# Patient Record
Sex: Male | Born: 1940 | Race: White | Hispanic: No | Marital: Married | Smoking: Current every day smoker
Health system: Southern US, Community
[De-identification: ages and names within clinical notes are randomized; demographics above are authoritative.]

## PROBLEM LIST (undated history)

## (undated) DIAGNOSIS — C7951 Secondary malignant neoplasm of bone: Secondary | ICD-10-CM

## (undated) DIAGNOSIS — K219 Gastro-esophageal reflux disease without esophagitis: Secondary | ICD-10-CM

## (undated) DIAGNOSIS — H269 Unspecified cataract: Secondary | ICD-10-CM

## (undated) DIAGNOSIS — E785 Hyperlipidemia, unspecified: Secondary | ICD-10-CM

## (undated) DIAGNOSIS — Z923 Personal history of irradiation: Secondary | ICD-10-CM

## (undated) DIAGNOSIS — C61 Malignant neoplasm of prostate: Secondary | ICD-10-CM

## (undated) DIAGNOSIS — I5022 Chronic systolic (congestive) heart failure: Secondary | ICD-10-CM

## (undated) DIAGNOSIS — I10 Essential (primary) hypertension: Secondary | ICD-10-CM

## (undated) DIAGNOSIS — C801 Malignant (primary) neoplasm, unspecified: Secondary | ICD-10-CM

## (undated) HISTORY — PX: OTHER SURGICAL HISTORY: SHX169

## (undated) HISTORY — DX: Hyperlipidemia, unspecified: E78.5

## (undated) HISTORY — DX: Essential (primary) hypertension: I10

## (undated) HISTORY — DX: Chronic systolic (congestive) heart failure: I50.22

## (undated) HISTORY — DX: Gastro-esophageal reflux disease without esophagitis: K21.9

## (undated) HISTORY — DX: Malignant neoplasm of prostate: C61

## (undated) HISTORY — DX: Secondary malignant neoplasm of bone: C79.51

---
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
## 2003-01-18 HISTORY — PX: CHOLECYSTECTOMY: SHX55

## 2008-08-19 DIAGNOSIS — C801 Malignant (primary) neoplasm, unspecified: Secondary | ICD-10-CM | POA: Insufficient documentation

## 2008-08-19 DIAGNOSIS — Z923 Personal history of irradiation: Secondary | ICD-10-CM

## 2008-08-19 HISTORY — DX: Personal history of irradiation: Z92.3

## 2008-08-19 HISTORY — DX: Malignant (primary) neoplasm, unspecified: C80.1

## 2012-07-06 DIAGNOSIS — I119 Hypertensive heart disease without heart failure: Secondary | ICD-10-CM | POA: Insufficient documentation

## 2012-07-06 DIAGNOSIS — I10 Essential (primary) hypertension: Secondary | ICD-10-CM | POA: Insufficient documentation

## 2012-07-06 DIAGNOSIS — I447 Left bundle-branch block, unspecified: Secondary | ICD-10-CM

## 2012-07-06 DIAGNOSIS — I2510 Atherosclerotic heart disease of native coronary artery without angina pectoris: Secondary | ICD-10-CM | POA: Insufficient documentation

## 2012-07-06 DIAGNOSIS — E782 Mixed hyperlipidemia: Secondary | ICD-10-CM

## 2012-07-06 HISTORY — DX: Atherosclerotic heart disease of native coronary artery without angina pectoris: I25.10

## 2012-07-06 HISTORY — DX: Essential (primary) hypertension: I10

## 2012-07-06 HISTORY — DX: Left bundle-branch block, unspecified: I44.7

## 2012-07-06 HISTORY — DX: Hypertensive heart disease without heart failure: I11.9

## 2012-07-06 HISTORY — DX: Mixed hyperlipidemia: E78.2

## 2013-11-17 DIAGNOSIS — H269 Unspecified cataract: Secondary | ICD-10-CM

## 2013-11-17 HISTORY — DX: Unspecified cataract: H26.9

## 2014-07-08 DIAGNOSIS — I359 Nonrheumatic aortic valve disorder, unspecified: Secondary | ICD-10-CM

## 2014-07-08 DIAGNOSIS — I6529 Occlusion and stenosis of unspecified carotid artery: Secondary | ICD-10-CM | POA: Insufficient documentation

## 2014-07-08 HISTORY — DX: Occlusion and stenosis of unspecified carotid artery: I65.29

## 2014-07-08 HISTORY — DX: Nonrheumatic aortic valve disorder, unspecified: I35.9

## 2015-11-27 DIAGNOSIS — N183 Chronic kidney disease, stage 3 unspecified: Secondary | ICD-10-CM

## 2015-11-27 HISTORY — DX: Chronic kidney disease, stage 3 unspecified: N18.30

## 2018-03-22 DIAGNOSIS — K219 Gastro-esophageal reflux disease without esophagitis: Secondary | ICD-10-CM | POA: Insufficient documentation

## 2018-03-22 DIAGNOSIS — E538 Deficiency of other specified B group vitamins: Secondary | ICD-10-CM | POA: Insufficient documentation

## 2018-03-22 HISTORY — DX: Deficiency of other specified B group vitamins: E53.8

## 2018-03-22 HISTORY — DX: Gastro-esophageal reflux disease without esophagitis: K21.9

## 2018-06-25 DIAGNOSIS — M7022 Olecranon bursitis, left elbow: Secondary | ICD-10-CM

## 2018-06-25 HISTORY — DX: Olecranon bursitis, left elbow: M70.22

## 2019-02-04 ENCOUNTER — Other Ambulatory Visit (HOSPITAL_COMMUNITY): Payer: Self-pay | Admitting: Urology

## 2019-02-04 ENCOUNTER — Other Ambulatory Visit: Payer: Self-pay | Admitting: Urology

## 2019-02-04 DIAGNOSIS — C61 Malignant neoplasm of prostate: Secondary | ICD-10-CM

## 2019-02-11 ENCOUNTER — Other Ambulatory Visit: Payer: Self-pay

## 2019-02-11 ENCOUNTER — Encounter (HOSPITAL_COMMUNITY)
Admission: RE | Admit: 2019-02-11 | Discharge: 2019-02-11 | Disposition: A | Discharge status: Home / Self Care | Payer: BC Managed Care – PPO | Source: Ambulatory Visit | Attending: Urology | Admitting: Urology

## 2019-02-11 DIAGNOSIS — C61 Malignant neoplasm of prostate: Secondary | ICD-10-CM

## 2019-02-11 MED ORDER — AXUMIN (FLUCICLOVINE F 18) INJECTION
8.6000 | Freq: Once | INTRAVENOUS | Status: AC | PRN
  Administered 2019-02-11: 8.6 via INTRAVENOUS

## 2019-02-23 ENCOUNTER — Encounter: Payer: Self-pay | Admitting: Gastroenterology

## 2019-02-24 ENCOUNTER — Encounter: Payer: Self-pay | Admitting: Cardiology

## 2019-02-24 ENCOUNTER — Ambulatory Visit (INDEPENDENT_AMBULATORY_CARE_PROVIDER_SITE_OTHER): Payer: BC Managed Care – PPO | Admitting: Cardiology

## 2019-02-24 ENCOUNTER — Other Ambulatory Visit: Payer: Self-pay

## 2019-02-24 ENCOUNTER — Ambulatory Visit: Payer: BC Managed Care – PPO | Admitting: Cardiology

## 2019-02-24 VITALS — BP 132/76 | HR 81 | Ht 69.0 in | Wt 188.0 lb

## 2019-02-24 DIAGNOSIS — R9431 Abnormal electrocardiogram [ECG] [EKG]: Secondary | ICD-10-CM | POA: Diagnosis not present

## 2019-02-24 DIAGNOSIS — I10 Essential (primary) hypertension: Secondary | ICD-10-CM

## 2019-02-24 DIAGNOSIS — F17210 Nicotine dependence, cigarettes, uncomplicated: Secondary | ICD-10-CM | POA: Diagnosis not present

## 2019-02-24 DIAGNOSIS — E7849 Other hyperlipidemia: Secondary | ICD-10-CM

## 2019-02-24 DIAGNOSIS — R0609 Other forms of dyspnea: Secondary | ICD-10-CM

## 2019-02-24 HISTORY — DX: Nicotine dependence, cigarettes, uncomplicated: F17.210

## 2019-02-24 HISTORY — DX: Other hyperlipidemia: E78.49

## 2019-02-24 MED ORDER — ATORVASTATIN CALCIUM 20 MG PO TABS: 20.0000 mg | ORAL_TABLET | Freq: Every day | ORAL | 3 refills | Status: DC

## 2019-02-24 NOTE — Progress Notes (Signed)
Cardiology Office Note:    Date:  02/24/2019   ID:  Tommy Ward, DOB Nov 07, 1940, MRN [id number]  PCP:  Serita Grammes, MD  Cardiologist:  Jenean Lindau, MD   Referring MD: Serita Grammes, MD    ASSESSMENT:    1. Familial hyperlipidemia   2. Essential hypertension   3. 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 Dyspnea on exertion   5. EKG abnormality    PLAN:    In order of problems listed above:  1. 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  He has not had blood work recently and therefore we will get him in the morning for blood work including Chem-7 and liver lipid check.  Then we will initiate him on atorvastatin 20 mg daily.  He has tried statin therapy in the past but it has not worked for him because of myalgias.  He is willing to try it again.  He tells me that injectable lipid-lowering medications were tried in the past but subsequently they were not approved by his insurance company.  So we will try statins.  He is agreeable. 2. Abnormal EKG and dyspnea on exertion: Patient will undergo Lexiscan sestamibi to assess for objective evidence of coronary artery disease in view of risk factors. 3. Essential hypertension: His blood pressure stable and diet was discussed including salt intake issues.  Echocardiogram will be done to assess murmur heard on auscultation. 4. Cigarette smoking: I spent 5 minutes with the patient discussing solely about smoking. Smoking cessation was counseled. I suggested to the patient also different medications and pharmacological interventions. Patient is keen to try stopping on its own at this time. He will get back to me if he needs any further assistance in this matter. 5. Follow-up appointment in 2 months or earlier if he has any concerns.   Medication Adjustments/Labs and Tests Ordered: Current medicines are reviewed at length with the patient today.  Concerns regarding  medicines are outlined above.  No orders of the defined types were placed in this encounter.  No orders of the defined types were placed in this encounter.    History of Present Illness:    Tommy Ward is a 78 y.o. male who is being seen today for the evaluation of marked hyperlipidemia at the request of Serita Grammes, MD.  Patient is a pleasant 78 year old male with past medical history of essential hypertension, family hyperlipidemia and active heavy smoking.  He is referred here because of elevated lipids and abnormal EKG.  He leads a sedentary lifestyle because of orthopedic issues.  No chest pain orthopnea or PND.  At the time of my evaluation, the patient is alert awake oriented and in no distress.  History reviewed. No pertinent past medical history.  Past Surgical History:  Procedure Laterality Date  . left hip repaired      Current Medications: Current Meds  Medication Sig  . cholecalciferol (VITAMIN D3) 25 MCG (1000 UT) tablet Take 1,000 Units by mouth daily.  Marland Kitchen LEUPROLIDE ACETATE, 6 MONTH, 45 MG injection   . lisinopril (ZESTRIL) 40 MG tablet Take 1 tablet by mouth daily.  . meclizine (ANTIVERT) 25 MG tablet Take 1 tablet by mouth daily.  . metoprolol succinate (TOPROL-XL) 50 MG 24 hr tablet Take 1 tablet by mouth daily.  Marland Kitchen omeprazole (PRILOSEC) 40 MG capsule Take 1 capsule by mouth daily.  . pregabalin (LYRICA) 75 MG capsule Take 1 capsule by mouth daily.  Marland Kitchen  tamsulosin (FLOMAX) 0.4 MG CAPS capsule Take 1 capsule by mouth daily.  Marland Kitchen thiamine (VITAMIN B-1) 100 MG tablet Take 100 mg by mouth daily.     Allergies:   Patient has no known allergies.   Social History   Socioeconomic History  . Marital status: Married    Spouse name: Not on file  . Number of children: Not on file  . Years of education: Not on file  . Highest education level: Not on file  Occupational History  . Not on file  Social Needs  . Financial resource strain: Not on file  . Food insecurity     Worry: Not on file    Inability: Not on file  . Transportation needs    Medical: Not on file    Non-medical: Not on file  Tobacco Use  . Smoking status: Current Every Day Smoker  . Smokeless tobacco: Never Used  Substance and Sexual Activity  . Alcohol use: Not on file  . Drug use: Not on file  . Sexual activity: Not on file  Lifestyle  . Physical activity    Days per week: Not on file    Minutes per session: Not on file  . Stress: Not on file  Relationships  . Social Herbalist on phone: Not on file    Gets together: Not on file    Attends religious service: Not on file    Active member of club or organization: Not on file    Attends meetings of clubs or organizations: Not on file    Relationship status: Not on file  Other Topics Concern  . Not on file  Social History Narrative  . Not on file     Family History: The patient's family history includes Heart attack in his brother; Leukemia in his mother; Prostate cancer in his father.  ROS:   Please see the history of present illness.    All other systems reviewed and are negative.  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  And T wave inversions in lateral leads.   Recent Labs: No results found for requested labs within last 8760 hours.  Recent Lipid Panel No results found for: CHOL, TRIG, HDL, CHOLHDL, VLDL, LDLCALC, LDLDIRECT  Physical Exam:    VS:  BP 132/76 (BP Location: Left Arm, Patient Position: Sitting, Cuff Size: Normal)   Pulse 81   Ht 5\' 9"  (1.753 m)   Wt 188 lb (85.3 kg)   SpO2 98%   BMI 27.76 kg/m     Wt Readings from Last 3 Encounters:  02/24/19 188 lb (85.3 kg)     GEN: Patient is in no acute distress HEENT: Normal NECK: No JVD; No carotid bruits LYMPHATICS: No  lymphadenopathy CARDIAC: S1 S2 regular, 2/6 systolic murmur at the apex. RESPIRATORY:  Clear to auscultation without rales, wheezing or rhonchi  ABDOMEN: Soft, non-tender, non-distended MUSCULOSKELETAL:  No edema; No deformity  SKIN: Warm and dry NEUROLOGIC:  Alert and oriented x 3 PSYCHIATRIC:  Normal affect    Signed, Jenean Lindau, MD  02/24/2019 2:28 PM    [place]

## 2019-02-24 NOTE — Patient Instructions (Addendum)
Medication Instructions:  Your physician has recommended you make the following change in your medication:   START taking atorvastatin 20 mg (1 tablet) once daily  If you need a refill on your cardiac medications before your next appointment, please call your pharmacy.   Lab work: Your physician recommends that you return FASTING tomorrow for BMP, lipid and liver panel to be drawn.  If you have labs (blood work) drawn today and your tests are completely normal, you will receive your results only by: Marland Kitchen MyChart Message (if you have MyChart) OR . A paper copy in the mail If you have any lab test that is abnormal or we need to change your treatment, we will call you to review the results.  Testing/Procedures: You had an EKG performed today.   Your physician has requested that you have an echocardiogram. Echocardiography is a painless test that uses sound waves to create images of your heart. It provides your doctor with information about the size and shape of your heart and how well your heart's chambers and valves are working. This procedure takes approximately one hour. There are no restrictions for this procedure.  Your physician has requested that you have a lexiscan myoview. For further information please visit [email or web address]. Please follow instruction sheet, as given.    Follow-Up: At [facility], you and your health needs are our priority.  As part of our continuing mission to provide you with exceptional heart care, we have created designated Provider Care Teams.  These Care Teams include your primary Cardiologist (physician) and Advanced Practice Providers (APPs -  Physician Assistants and Nurse Practitioners) who all work together to provide you with the care you need, when you need it. You will need a follow up appointment in 2 months.     Any Other Special Instructions Will Be Listed Below  Atorvastatin tablets What is this medicine? ATORVASTATIN (a TORE va sta tin)  is known as a HMG-CoA reductase inhibitor or 'statin'. It lowers the level of cholesterol and triglycerides in the blood. This drug may also reduce the risk of heart attack, stroke, or other health problems in patients with risk factors for heart disease. Diet and lifestyle changes are often used with this drug. This medicine may be used for other purposes; ask your health care provider or pharmacist if you have questions. COMMON BRAND NAME(S): Lipitor What should I tell my health care provider before I take this medicine? They need to know if you have any of these conditions:  diabetes  if you often drink alcohol  history of stroke  kidney disease  liver disease  muscle aches or weakness  thyroid disease  an unusual or allergic reaction to atorvastatin, other medicines, foods, dyes, or preservatives  pregnant or trying to get pregnant  breast-feeding How should I use this medicine? Take this medicine by mouth with a glass of water. Follow the directions on the prescription label. You can take it with or without food. If it upsets your stomach, take it with food. Do not take with grapefruit juice. Take your medicine at regular intervals. Do not take it more often than directed. Do not stop taking except on your doctor's advice. Talk to your pediatrician regarding the use of this medicine in children. While this drug may be prescribed for children as young as 10 for selected conditions, precautions do apply. Overdosage: If you think you have taken too much of this medicine contact a poison control center or emergency room at once. NOTE:  This medicine is only for you. Do not share this medicine with others. What if I miss a dose? If you miss a dose, take it as soon as you can. If your next dose is to be taken in less than 12 hours, then do not take the missed dose. Take the next dose at your regular time. Do not take double or extra doses. What may interact with this medicine? Do not  take this medicine with any of the following medications:  dasabuvir; ombitasvir; paritaprevir; ritonavir  ombitasvir; paritaprevir; ritonavir  posaconazole  red yeast rice This medicine may also interact with the following medications:  alcohol  birth control pills  certain antibiotics like erythromycin and clarithromycin  certain antivirals for HIV or hepatitis  certain medicines for cholesterol like fenofibrate, gemfibrozil, and niacin  certain medicines for fungal infections like ketoconazole and itraconazole  colchicine  cyclosporine  digoxin  grapefruit juice  rifampin This list may not describe all possible interactions. Give your health care provider a list of all the medicines, herbs, non-prescription drugs, or dietary supplements you use. Also tell them if you smoke, drink alcohol, or use illegal drugs. Some items may interact with your medicine. What should I watch for while using this medicine? Visit your doctor or health care professional for regular check-ups. You may need regular tests to make sure your liver is working properly. Your health care professional may tell you to stop taking this medicine if you develop muscle problems. If your muscle problems do not go away after stopping this medicine, contact your health care professional. Do not become pregnant while taking this medicine. Women should inform their health care professional if they wish to become pregnant or think they might be pregnant. There is a potential for serious side effects to an unborn child. Talk to your health care professional or pharmacist for more information. Do not breast-feed an infant while taking this medicine. This medicine may increase blood sugar. Ask your healthcare provider if changes in diet or medicines are needed if you have diabetes. If you are going to need surgery or other procedure, tell your doctor that you are using this medicine. This drug is only part of a total  heart-health program. Your doctor or a dietician can suggest a low-cholesterol and low-fat diet to help. Avoid alcohol and smoking, and keep a proper exercise schedule. This medicine may cause a decrease in Co-Enzyme Q-10. You should make sure that you get enough Co-Enzyme Q-10 while you are taking this medicine. Discuss the foods you eat and the vitamins you take with your health care professional. What side effects may I notice from receiving this medicine? Side effects that you should report to your doctor or health care professional as soon as possible:  allergic reactions like skin rash, itching or hives, swelling of the face, lips, or tongue  fever  joint pain  loss of memory  redness, blistering, peeling or loosening of the skin, including inside the mouth  signs and symptoms of high blood sugar such as being more thirsty or hungry or having to urinate more than normal. You may also feel very tired or have blurry vision.  signs and symptoms of liver injury like dark yellow or brown urine; general ill feeling or flu-like symptoms; light-belly pain; unusually weak or tired; yellowing of the eyes or skin  signs and symptoms of muscle injury like dark urine; trouble passing urine or change in the amount of urine; unusually weak or tired; muscle pain  or side or back pain Side effects that usually do not require medical attention (report to your doctor or health care professional if they continue or are bothersome):  diarrhea  nausea  stomach pain  trouble sleeping  upset stomach This list may not describe all possible side effects. Call your doctor for medical advice about side effects. You may report side effects to FDA at 1-800-FDA-1088. Where should I keep my medicine? Keep out of the reach of children. Store between 20 and 25 degrees C (68 and 77 degrees F). Throw away any unused medicine after the expiration date. NOTE: This sheet is a summary. It may not cover all possible  information. If you have questions about this medicine, talk to your doctor, pharmacist, or health care provider.  2020 Elsevier/Gold Standard (2018-05-27 11:36:16)  Echocardiogram An echocardiogram is a procedure that uses painless sound waves (ultrasound) to produce an image of the heart. Images from an echocardiogram can provide important information about:  Signs of coronary artery disease (CAD).  Aneurysm detection. An aneurysm is a weak or damaged part of an artery wall that bulges out from the normal force of blood pumping through the body.  Heart size and shape. Changes in the size or shape of the heart can be associated with certain conditions, including heart failure, aneurysm, and CAD.  Heart muscle function.  Heart valve function.  Signs of a past heart attack.  Fluid buildup around the heart.  Thickening of the heart muscle.  A tumor or infectious growth around the heart valves. Tell a health care provider about:  Any allergies you have.  All medicines you are taking, including vitamins, herbs, eye drops, creams, and over-the-counter medicines.  Any blood disorders you have.  Any surgeries you have had.  Any medical conditions you have.  Whether you are pregnant or may be pregnant. What are the risks? Generally, this is a safe procedure. However, problems may occur, including:  Allergic reaction to dye (contrast) that may be used during the procedure. What happens before the procedure? No specific preparation is needed. You may eat and drink normally. What happens during the procedure?   An IV tube may be inserted into one of your veins.  You may receive contrast through this tube. A contrast is an injection that improves the quality of the pictures from your heart.  A gel will be applied to your chest.  A wand-like tool (transducer) will be moved over your chest. The gel will help to transmit the sound waves from the transducer.  The sound waves  will harmlessly bounce off of your heart to allow the heart images to be captured in real-time motion. The images will be recorded on a computer. The procedure may vary among health care providers and hospitals. What happens after the procedure?  You may return to your normal, everyday life, including diet, activities, and medicines, unless your health care provider tells you not to do that. Summary  An echocardiogram is a procedure that uses painless sound waves (ultrasound) to produce an image of the heart.  Images from an echocardiogram can provide important information about the size and shape of your heart, heart muscle function, heart valve function, and fluid buildup around your heart.  You do not need to do anything to prepare before this procedure. You may eat and drink normally.  After the echocardiogram is completed, you may return to your normal, everyday life, unless your health care provider tells you not to do that. This  information is not intended to replace advice given to you by your health care provider. Make sure you discuss any questions you have with your health care provider. Document Released: 08/02/2000 Document Revised: 11/26/2018 Document Reviewed: 09/07/2016 Elsevier Patient Education  [place] injection What is this medicine? REGADENOSON is used to test the heart for coronary artery disease. It is used in patients who can not exercise for their stress test. This medicine may be used for other purposes; ask your health care provider or pharmacist if you have questions. COMMON BRAND NAME(S): Lexiscan What should I tell my health care provider before I take this medicine? They need to know if you have any of these conditions:  heart problems  lung or breathing disease, like asthma or COPD  an unusual or allergic reaction to regadenoson, other medicines, foods, dyes, or preservatives  pregnant or trying to get  pregnant  breast-feeding How should I use this medicine? This medicine is for injection into a vein. It is given by a health care professional in a hospital or clinic setting. Talk to your pediatrician regarding the use of this medicine in children. Special care may be needed. Overdosage: If you think you have taken too much of this medicine contact a poison control center or emergency room at once. NOTE: This medicine is only for you. Do not share this medicine with others. What if I miss a dose? This does not apply. What may interact with this medicine?  caffeine  dipyridamole  guarana  theophylline This list may not describe all possible interactions. Give your health care provider a list of all the medicines, herbs, non-prescription drugs, or dietary supplements you use. Also tell them if you smoke, drink alcohol, or use illegal drugs. Some items may interact with your medicine. What should I watch for while using this medicine? Your condition will be monitored carefully while you are receiving this medicine. Do not take medicines, foods, or drinks with caffeine (like coffee, tea, or colas) for at least 12 hours before your test. If you do not know if something contains caffeine, ask your health care professional. What side effects may I notice from receiving this medicine? Side effects that you should report to your doctor or health care professional as soon as possible:  allergic reactions like skin rash, itching or hives, swelling of the face, lips, or tongue  breathing problems  chest pain, tightness or palpitations  severe headache Side effects that usually do not require medical attention (report to your doctor or health care professional if they continue or are bothersome):  flushing  headache  irritation or pain at site where injected  nausea, vomiting This list may not describe all possible side effects. Call your doctor for medical advice about side effects.  You may report side effects to FDA at 1-800-FDA-1088. Where should I keep my medicine? This drug is given in a hospital or clinic and will not be stored at home. NOTE: This sheet is a summary. It may not cover all possible information. If you have questions about this medicine, talk to your doctor, pharmacist, or health care provider.  2020 Elsevier/Gold Standard (2008-04-04 15:08:13)  Cardiac Nuclear Scan A cardiac nuclear scan is a test that is done to check the flow of blood to your heart. It is done when you are resting and when you are exercising. The test looks for problems such as:  Not enough blood reaching a portion of the heart.  The heart muscle  not working as it should. You may need this test if:  You have heart disease.  You have had lab results that are not normal.  You have had heart surgery or a balloon procedure to open up blocked arteries (angioplasty).  You have chest pain.  You have shortness of breath. In this test, a special dye (tracer) is put into your bloodstream. The tracer will travel to your heart. A camera will then take pictures of your heart to see how the tracer moves through your heart. This test is usually done at a hospital and takes 2-4 hours. Tell a doctor about:  Any allergies you have.  All medicines you are taking, including vitamins, herbs, eye drops, creams, and over-the-counter medicines.  Any problems you or family members have had with anesthetic medicines.  Any blood disorders you have.  Any surgeries you have had.  Any medical conditions you have.  Whether you are pregnant or may be pregnant. What are the risks? Generally, this is a safe test. However, problems may occur, such as:  Serious chest pain and heart attack. This is only a risk if the stress portion of the test is done.  Rapid heartbeat.  A feeling of warmth in your chest. This feeling usually does not last long.  Allergic reaction to the tracer. What happens  before the test?  Ask your doctor about changing or stopping your normal medicines. This is important.  Follow instructions from your doctor about what you cannot eat or drink.  Remove your jewelry on the day of the test. What happens during the test?  An IV tube will be inserted into one of your veins.  Your doctor will give you a small amount of tracer through the IV tube.  You will wait for 20-40 minutes while the tracer moves through your bloodstream.  Your heart will be monitored with an electrocardiogram (ECG).  You will lie down on an exam table.  Pictures of your heart will be taken for about 15-20 minutes.  You may also have a stress test. For this test, one of these things may be done: ? You will be asked to exercise on a treadmill or a stationary bike. ? You will be given medicines that will make your heart work harder. This is done if you are unable to exercise.  When blood flow to your heart has peaked, a tracer will again be given through the IV tube.  After 20-40 minutes, you will get back on the exam table. More pictures will be taken of your heart.  Depending on the tracer that is used, more pictures may need to be taken 3-4 hours later.  Your IV tube will be removed when the test is over. The test may vary among doctors and hospitals. What happens after the test?  Ask your doctor: ? Whether you can return to your normal schedule, including diet, activities, and medicines. ? Whether you should drink more fluids. This will help to remove the tracer from your body. Drink enough fluid to keep your pee (urine) pale yellow.  Ask your doctor, or the department that is doing the test: ? When will my results be ready? ? How will I get my results? Summary  A cardiac nuclear scan is a test that is done to check the flow of blood to your heart.  Tell your doctor whether you are pregnant or may be pregnant.  Before the test, ask your doctor about changing or  stopping your normal  medicines. This is important.  Ask your doctor whether you can return to your normal activities. You may be asked to drink more fluids. This information is not intended to replace advice given to you by your health care provider. Make sure you discuss any questions you have with your health care provider. Document Released: 01/19/2018 Document Revised: 11/25/2018 Document Reviewed: 01/19/2018 Elsevier Patient Education  2020 [employer].

## 2019-02-25 LAB — LIPID PANEL
Chol/HDL Ratio: 7.8 ratio — ABNORMAL HIGH (ref 0.0–5.0)
Cholesterol, Total: 400 mg/dL — ABNORMAL HIGH (ref 100–199)
HDL: 51 mg/dL (ref >39)
LDL Calculated: 290 mg/dL — ABNORMAL HIGH (ref 0–99)
Triglycerides: 295 mg/dL — ABNORMAL HIGH (ref 0–149)
VLDL Cholesterol Cal: 59 mg/dL — ABNORMAL HIGH (ref 5–40)

## 2019-02-25 LAB — BASIC METABOLIC PANEL
BUN/Creatinine Ratio: 21 (ref 10–24)
BUN: 61 mg/dL — ABNORMAL HIGH (ref 8–27)
CO2: 16 mmol/L — ABNORMAL LOW (ref 20–29)
Calcium: 10.6 mg/dL — ABNORMAL HIGH (ref 8.6–10.2)
Chloride: 109 mmol/L — ABNORMAL HIGH (ref 96–106)
Creatinine, Ser: 2.87 mg/dL — ABNORMAL HIGH (ref 0.76–1.27)
GFR calc Af Amer: 23 mL/min/{1.73_m2} — ABNORMAL LOW (ref >59)
GFR calc non Af Amer: 20 mL/min/{1.73_m2} — ABNORMAL LOW (ref >59)
Glucose: 108 mg/dL — ABNORMAL HIGH (ref 65–99)
Potassium: 6 mmol/L — ABNORMAL HIGH (ref 3.5–5.2)
Sodium: 139 mmol/L (ref 134–144)

## 2019-02-25 LAB — HEPATIC FUNCTION PANEL
ALT: 17 IU/L (ref 0–44)
AST: 20 IU/L (ref 0–40)
Albumin: 4.4 g/dL (ref 3.7–4.7)
Alkaline Phosphatase: 61 IU/L (ref 39–117)
Bilirubin Total: 0.3 mg/dL (ref 0.0–1.2)
Bilirubin, Direct: 0.09 mg/dL (ref 0.00–0.40)
Total Protein: 6.3 g/dL (ref 6.0–8.5)

## 2019-02-26 ENCOUNTER — Encounter: Payer: Self-pay | Admitting: Gastroenterology

## 2019-02-26 ENCOUNTER — Telehealth (INDEPENDENT_AMBULATORY_CARE_PROVIDER_SITE_OTHER): Payer: BC Managed Care – PPO | Admitting: Gastroenterology

## 2019-02-26 ENCOUNTER — Other Ambulatory Visit: Payer: Self-pay

## 2019-02-26 VITALS — Ht 69.0 in | Wt 187.0 lb

## 2019-02-26 DIAGNOSIS — D638 Anemia in other chronic diseases classified elsewhere: Secondary | ICD-10-CM

## 2019-02-26 DIAGNOSIS — K219 Gastro-esophageal reflux disease without esophagitis: Secondary | ICD-10-CM | POA: Diagnosis not present

## 2019-02-26 DIAGNOSIS — K625 Hemorrhage of anus and rectum: Secondary | ICD-10-CM | POA: Diagnosis not present

## 2019-02-26 NOTE — Addendum Note (Signed)
Addended by: Herma Mering D on: 02/26/2019 04:35 PM   Modules accepted: Orders

## 2019-02-26 NOTE — Progress Notes (Signed)
Chief Complaint: Rectal bleeding/anemia  Referring Provider:  Serita Grammes, MD      ASSESSMENT AND PLAN;   #1. Rectal Bleeding. H/O XRT for prostate Ca. D/d hoids, AVMs, colitis, polyps, stercoral ulcers etc, r/o colonic neoplasms or IBD. #2. 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 GERD.  Plan: - Proceed with EGD/colon with miralax. Discussed risks & benefits. (Risks including rare perforation req laparotomy, bleeding after biopsies/polypectomy req blood transfusion, rare chance of missing neoplasms, risks of anesthesia/sedation). Benefits outweigh the risks. Patient agrees to proceed. All the questions were answered.  - Continue omeprazole 40mg  po qd.  Once under better control, decrease dose. - Switch sweet tea to un-sweet tea. May help with reflux.  Lifestyle changes were discussed as well. - May need ? nephrology consultation.   HPI:    Tommy Ward is a 78 y.o. male  With occ rectal bleeding -intermittent, especially when he gets constipated, has been taking Prevalite and orange juice every day with good results.  Blood mostly away from the stool.  Mostly bright red.  No rectal or lower abdominal pain.  Found to be anemic with hemoglobin of 11.2, rechecked at 11.6 on 01/2019. Also had one episode of ? melanotic stools.  None since. Has been having more heartburn over the last 2 to 3 weeks.  No odynophagia or dysphagia.  Has changed some of his medicines.  No weight loss.  No nonsteroidals.  Had "irregular heart rate"-seen by Dr. [name], likely PVCs.  Had negative 2D echo.  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  Colonoscopies - several in [place]. Last time at age 51. Had polyps in past but none during the last colonoscopy. Past Medical History:  Diagnosis Date  . GERD (gastroesophageal reflux disease)   . Hypertension    -Stage IV prostate cancer, CRI, ?DM, familial hypercholesterolemia.  Past Surgical History:  Procedure  Laterality Date  . left hip repaired      Family History  Problem Relation Age of Onset  . Leukemia Mother   . Prostate cancer Father   . Heart attack Brother   . Colon cancer Neg Hx     Social History   Tobacco Use  . Smoking status: Current Every Day Smoker    Packs/day: 0.75  . Smokeless tobacco: Never Used  Substance Use Topics  . Alcohol use: Not Currently  . Drug use: Not on file    Current Outpatient Medications  Medication Sig Dispense Refill  . atorvastatin (LIPITOR) 20 MG tablet Take 1 tablet (20 mg total) by mouth daily. 90 tablet 3  . cholecalciferol (VITAMIN D3) 25 MCG (1000 UT) tablet Take 1,000 Units by mouth daily.    Marland Kitchen lisinopril (ZESTRIL) 40 MG tablet Take 1 tablet by mouth daily.    . meclizine (ANTIVERT) 25 MG tablet Take 1 tablet by mouth daily.    . metoprolol succinate (TOPROL-XL) 50 MG 24 hr tablet Take 1 tablet by mouth daily.    Marland Kitchen omeprazole (PRILOSEC) 40 MG capsule Take 1 capsule by mouth daily.    . pregabalin (LYRICA) 75 MG capsule Take 1 capsule by mouth daily.    . tamsulosin (FLOMAX) 0.4 MG CAPS capsule Take 1 capsule by mouth daily.    Marland Kitchen thiamine (VITAMIN B-1) 100 MG tablet Take 100 mg by mouth daily.    Marland Kitchen LEUPROLIDE ACETATE, 6 MONTH, 45 MG injection      No current facility-administered medications for this  visit.     No Known Allergies  Review of Systems:  Constitutional: Denies fever, chills, diaphoresis, appetite change and fatigue.  HEENT: Denies photophobia, eye pain, redness, hearing loss, ear pain, congestion, sore throat, rhinorrhea, sneezing, mouth sores, neck pain, neck stiffness and tinnitus.   Respiratory: Denies SOB, DOE, cough, chest tightness,  and wheezing.   Cardiovascular: Denies chest pain, palpitations and leg swelling.  Genitourinary: Denies dysuria, urgency, frequency, hematuria, flank pain and difficulty urinating.  Musculoskeletal: Denies myalgias, back pain, joint swelling, arthralgias and gait problem.  Skin:  No rash.  Neurological: Denies dizziness, seizures, syncope, weakness, light-headedness, numbness and headaches.  Hematological: Denies adenopathy. Easy bruising, personal or family bleeding history  Psychiatric/Behavioral: No anxiety or depression     Physical Exam:    Ht 5\' 9"  (1.753 m)   Wt 187 lb (84.8 kg)   BMI 27.62 kg/m  Filed Weights   02/26/19 1427  Weight: 187 lb (84.8 kg)  televisit.  Data Reviewed: I have personally reviewed following labs and imaging studies  CBC: No flowsheet data found.  CMP: CMP Latest Ref Rng & Units 02/25/2019  Glucose 65 - 99 mg/dL 108(H)  BUN 8 - 27 mg/dL 61(H)  Creatinine 0.76 - 1.27 mg/dL 2.87(H)  Sodium 134 - 144 mmol/L 139  Potassium 3.5 - 5.2 mmol/L 6.0(H)  Chloride 96 - 106 mmol/L 109(H)  CO2 20 - 29 mmol/L 16(L)  Calcium 8.6 - 10.2 mg/dL 10.6(H)  Total Protein 6.0 - 8.5 g/dL 6.3  Total Bilirubin 0.0 - 1.2 mg/dL 0.3  Alkaline Phos 39 - 117 IU/L 61  AST 0 - 40 IU/L 20  ALT 0 - 44 IU/L 17    GFR: Estimated Creatinine Clearance: 21.6 mL/min (A) (by C-G formula based on SCr of 2.87 mg/dL (H)). Liver Function Tests: Recent Labs  Lab 02/25/19 0905  AST 20  ALT 17  ALKPHOS 61  BILITOT 0.3  PROT 6.3  ALBUMIN 4.4      Radiology Studies: Nm Pet (axumin) Skull Base To Mid Thigh  Result Date: 02/12/2019 CLINICAL DATA:  Prostate carcinoma with biochemical recurrence. PSA equal 1.4 EXAM: NUCLEAR MEDICINE PET SKULL BASE TO THIGH TECHNIQUE: 8.6 mCi F-18 Fluciclovine was injected intravenously. Full-ring PET imaging was performed from the skull base to thigh after the radiotracer. CT data was obtained and used for attenuation correction and anatomic localization. COMPARISON:  None FINDINGS: NECK No radiotracer activity in neck lymph nodes. Incidental CT finding: None CHEST No radiotracer accumulation within mediastinal or hilar lymph nodes. No suspicious pulmonary nodules on the CT scan. Incidental CT finding: None ABDOMEN/PELVIS  Prostate: No focal activity in the prostate bed. Lymph nodes: No abnormal radiotracer accumulation within pelvic or abdominal nodes. Liver: No evidence of liver metastasis Incidental CT finding: None SKELETON Focal activity within the LEFT sacral ala with SUV max equal 8.3 (image 71). This corresponds to a subtle intermediate density lesion on CT measuring 2.2 cm. No additional lesions in the skeleton accumulate the prostate cancer specific radiotracer. IMPRESSION: 1. Lesion in the LEFT sacral ala accumulates the prostate cancer specific radiotracer and consistent prostate cancer metastasis. 2. No evidence of local recurrence or nodal metastasis in the pelvis. 3. No soft tissue metastasis. Electronically Signed   By: Suzy Bouchard M.D.   On: 02/12/2019 11:04   This service was provided via telemedicine. Doxy-video visit failed as it would not connect,  the patient was located at home.  The provider was located in office.  The patient did consent  to this telephone visit and is aware of possible charges through their insurance for this visit.  The patient was referred by Serita Grammes MD.    Time spent on call/coordination of care/review of records: 37 min    Carmell Austria, MD 02/26/2019, 2:54 PM  Cc: Serita Grammes, MD

## 2019-02-26 NOTE — Patient Instructions (Signed)
If you are age 77 or older, your body mass index should be between 23-30. Your Body mass index is 27.62 kg/m. If this is out of the aforementioned range listed, please consider follow up with your Primary Care Provider.  If you are age 91 or younger, your body mass index should be between 19-25. Your Body mass index is 27.62 kg/m. If this is out of the aformentioned range listed, please consider follow up with your Primary Care Provider.   To help prevent the possible spread of infection to our patients, communities, and staff; we will be implementing the following measures:  As of now we are not allowing any visitors/family members to accompany you to any upcoming appointments with [facility]. If you have any concerns about this please contact our office to discuss prior to the appointment.   You have been scheduled for an endoscopy and colonoscopy. Please follow the written instructions given to you at your visit today. Please pick up your prep supplies at the pharmacy within the next 1-3 days. If you use inhalers (even only as needed), please bring them with you on the day of your procedure. Your physician has requested that you go to www.startemmi.com and enter the access code given to you at your visit today. This web site gives a general overview about your procedure. However, you should still follow specific instructions given to you by our office regarding your preparation for the procedure.  Please purchase the following medications over the counter and take as directed: Miralax  Continue taking your Omeprazole as prescribed.  Change Sweet tea to un-sweetened tea to see if it helps with your reflux.  Thank you,  Dr. Jackquline Denmark

## 2019-03-01 ENCOUNTER — Telehealth: Payer: Self-pay | Admitting: Cardiology

## 2019-03-01 ENCOUNTER — Encounter: Payer: Self-pay | Admitting: *Deleted

## 2019-03-01 ENCOUNTER — Telehealth: Payer: Self-pay | Admitting: *Deleted

## 2019-03-01 DIAGNOSIS — C7951 Secondary malignant neoplasm of bone: Secondary | ICD-10-CM

## 2019-03-01 DIAGNOSIS — I10 Essential (primary) hypertension: Secondary | ICD-10-CM

## 2019-03-01 DIAGNOSIS — C61 Malignant neoplasm of prostate: Secondary | ICD-10-CM

## 2019-03-01 MED ORDER — SODIUM POLYSTYRENE SULFONATE 15 GM/60ML PO SUSP: 15.0000 g | Freq: Once | ORAL | 0 refills | Status: DC

## 2019-03-01 MED ORDER — SODIUM POLYSTYRENE SULFONATE 15 GM/60ML PO SUSP: 15.0000 g | Freq: Once | ORAL | 0 refills | Status: AC

## 2019-03-01 NOTE — Telephone Encounter (Signed)
Patient called back. Verbalized understanding to both messages.

## 2019-03-01 NOTE — Addendum Note (Signed)
Addended by: Particia Nearing B on: 03/01/2019 08:18 AM   Modules accepted: Orders

## 2019-03-01 NOTE — Addendum Note (Signed)
Addended by: Particia Nearing B on: 03/01/2019 10:43 AM   Modules accepted: Orders

## 2019-03-01 NOTE — Telephone Encounter (Signed)
LAM to inform patient of rescheduling to 8/11 at 11:30

## 2019-03-01 NOTE — Telephone Encounter (Signed)
Telephone call to patient. Left message that Tommy Ward was called into the [facility] in [place] and to return call so that we know he got it.

## 2019-03-01 NOTE — Addendum Note (Signed)
Addended by: Particia Nearing B on: 03/01/2019 10:57 AM   Modules accepted: Orders

## 2019-03-01 NOTE — Telephone Encounter (Signed)
Telephone call to patient. Left message for  need of BMP on Wednsday and to return call so that I know he received this message

## 2019-03-02 ENCOUNTER — Ambulatory Visit (INDEPENDENT_AMBULATORY_CARE_PROVIDER_SITE_OTHER): Payer: BC Managed Care – PPO

## 2019-03-02 ENCOUNTER — Other Ambulatory Visit: Payer: Self-pay

## 2019-03-02 DIAGNOSIS — R0609 Other forms of dyspnea: Secondary | ICD-10-CM

## 2019-03-02 MED ORDER — REGADENOSON 0.4 MG/5ML IV SOLN
0.4000 mg | Freq: Once | INTRAVENOUS | Status: AC
  Administered 2019-03-02: 0.4 mg via INTRAVENOUS

## 2019-03-02 MED ORDER — TECHNETIUM TC 99M TETROFOSMIN IV KIT
10.1000 | PACK | Freq: Once | INTRAVENOUS | Status: AC | PRN
  Administered 2019-03-02: 10.1 via INTRAVENOUS

## 2019-03-02 MED ORDER — TECHNETIUM TC 99M TETROFOSMIN IV KIT
32.7000 | PACK | Freq: Once | INTRAVENOUS | Status: AC | PRN
  Administered 2019-03-02: 32.7 via INTRAVENOUS

## 2019-03-03 LAB — BASIC METABOLIC PANEL
BUN/Creatinine Ratio: 23 (ref 10–24)
BUN: 42 mg/dL — ABNORMAL HIGH (ref 8–27)
CO2: 19 mmol/L — ABNORMAL LOW (ref 20–29)
Calcium: 10 mg/dL (ref 8.6–10.2)
Chloride: 109 mmol/L — ABNORMAL HIGH (ref 96–106)
Creatinine, Ser: 1.81 mg/dL — ABNORMAL HIGH (ref 0.76–1.27)
GFR calc Af Amer: 41 mL/min/{1.73_m2} — ABNORMAL LOW (ref >59)
GFR calc non Af Amer: 35 mL/min/{1.73_m2} — ABNORMAL LOW (ref >59)
Glucose: 108 mg/dL — ABNORMAL HIGH (ref 65–99)
Potassium: 5 mmol/L (ref 3.5–5.2)
Sodium: 141 mmol/L (ref 134–144)

## 2019-03-03 LAB — MYOCARDIAL PERFUSION IMAGING
LV dias vol: 164 mL (ref 62–150)
LV sys vol: 98 mL
Peak HR: 82 {beats}/min
Rest HR: 62 {beats}/min
SDS: 7
SRS: 14
SSS: 21
TID: 1.02

## 2019-03-18 ENCOUNTER — Telehealth: Payer: Self-pay

## 2019-03-18 NOTE — Telephone Encounter (Signed)
Information relayed to patient, copy of results sent to Dr. Jeryl Columbia per Dr. Docia Furl request.

## 2019-03-18 NOTE — Telephone Encounter (Signed)
-----   Message from Jenean Lindau, MD sent at 03/03/2019  3:43 PM EDT ----- Please call the patient and let him know that his labs from here on will be followed by his primary care physician.  Please call the PCPs nurse and inform them.  His renal function has improved significantly Jenean Lindau, MD 03/03/2019 3:43 PM

## 2019-03-29 ENCOUNTER — Telehealth: Payer: Self-pay | Admitting: Gastroenterology

## 2019-03-29 NOTE — Telephone Encounter (Signed)
Left message to call back to ask Covid-19 screening questions. °Covid-19 Screening Questions: ° °Do you now or have you had a fever in the last 14 days?  ° °Do you have any respiratory symptoms of shortness of breath or cough now or in the last 14 days?  ° °Do you have any family members or close contacts with diagnosed or suspected Covid-19 in the past 14 days?  ° °Have you been tested for Covid-19 and found to be positive?  ° °Pt made aware of that care partner may wait in the car or come up to the lobby during the procedure but will need to provide their own mask. °

## 2019-03-30 ENCOUNTER — Other Ambulatory Visit: Payer: Self-pay

## 2019-03-30 ENCOUNTER — Encounter: Payer: Self-pay | Admitting: Gastroenterology

## 2019-03-30 ENCOUNTER — Ambulatory Visit (AMBULATORY_SURGERY_CENTER): Payer: BC Managed Care – PPO | Admitting: Gastroenterology

## 2019-03-30 VITALS — BP 117/45 | HR 70 | Temp 98.6°F | Resp 13 | Ht 69.0 in | Wt 187.0 lb

## 2019-03-30 DIAGNOSIS — K5730 Diverticulosis of large intestine without perforation or abscess without bleeding: Secondary | ICD-10-CM | POA: Diagnosis not present

## 2019-03-30 DIAGNOSIS — K269 Duodenal ulcer, unspecified as acute or chronic, without hemorrhage or perforation: Secondary | ICD-10-CM

## 2019-03-30 DIAGNOSIS — K648 Other hemorrhoids: Secondary | ICD-10-CM | POA: Diagnosis not present

## 2019-03-30 DIAGNOSIS — K625 Hemorrhage of anus and rectum: Secondary | ICD-10-CM

## 2019-03-30 DIAGNOSIS — K219 Gastro-esophageal reflux disease without esophagitis: Secondary | ICD-10-CM | POA: Diagnosis not present

## 2019-03-30 DIAGNOSIS — K2970 Gastritis, unspecified, without bleeding: Secondary | ICD-10-CM | POA: Diagnosis not present

## 2019-03-30 DIAGNOSIS — K627 Radiation proctitis: Secondary | ICD-10-CM | POA: Diagnosis not present

## 2019-03-30 MED ORDER — SODIUM CHLORIDE 0.9 % IV SOLN: 500.0000 mL | Freq: Once | INTRAVENOUS | Status: DC

## 2019-03-30 NOTE — Patient Instructions (Signed)
Information on diverticulosis and hemorrhoids given to you today.  Await pathology results.  High fiber diet to avoid constipation.  Take a stool softener if needed.  No aspirin, ibuprofen, naproxen or other nonsteroidal anti inflammatory medications.   Return to GI clinic in 12 weeks.  YOU HAD AN ENDOSCOPIC PROCEDURE TODAY AT [facility]:   Refer to the procedure report that was given to you for any specific questions about what was found during the examination.  If the procedure report does not answer your questions, please call your gastroenterologist to clarify.  If you requested that your care partner not be given the details of your procedure findings, then the procedure report has been included in a sealed envelope for you to review at your convenience later.  YOU SHOULD EXPECT: Some feelings of bloating in the abdomen. Passage of more gas than usual.  Walking can help get rid of the air that was put into your GI tract during the procedure and reduce the bloating. If you had a lower endoscopy (such as a colonoscopy or flexible sigmoidoscopy) you may notice spotting of blood in your stool or on the toilet paper. If you underwent a bowel prep for your procedure, you may not have a normal bowel movement for a few days.  Please Note:  You might notice some irritation and congestion in your nose or some drainage.  This is from the oxygen used during your procedure.  There is no need for concern and it should clear up in a day or so.  SYMPTOMS TO REPORT IMMEDIATELY:   Following lower endoscopy (colonoscopy or flexible sigmoidoscopy):  Excessive amounts of blood in the stool  Significant tenderness or worsening of abdominal pains  Swelling of the abdomen that is new, acute  Fever of 100F or higher   Following upper endoscopy (EGD)  Vomiting of blood or coffee ground material  New chest pain or pain under the shoulder blades  Painful or persistently difficult  swallowing  New shortness of breath  Fever of 100F or higher  Black, tarry-looking stools  For urgent or emergent issues, a gastroenterologist can be reached at any hour by calling [phone].   DIET:  We do recommend a small meal at first, but then you may proceed to your regular diet.  Drink plenty of fluids but you should avoid alcoholic beverages for 24 hours.  ACTIVITY:  You should plan to take it easy for the rest of today and you should NOT DRIVE or use heavy machinery until tomorrow (because of the sedation medicines used during the test).    FOLLOW UP: Our staff will call the number listed on your records 48-72 hours following your procedure to check on you and address any questions or concerns that you may have regarding the information given to you following your procedure. If we do not reach you, we will leave a message.  We will attempt to reach you two times.  During this call, we will ask if you have developed any symptoms of COVID 19. If you develop any symptoms (ie: fever, flu-like symptoms, shortness of breath, cough etc.) before then, please call [phone].  If you test positive for Covid 19 in the 2 weeks post procedure, please call and report this information to [place].    If any biopsies were taken you will be contacted by phone or by letter within the next 1-3 weeks.  Please call us at [phone] if you have not heard about  the biopsies in 3 weeks.    SIGNATURES/CONFIDENTIALITY: You and/or your care partner have signed paperwork which will be entered into your electronic medical record.  These signatures attest to the fact that that the information above on your After Visit Summary has been reviewed and is understood.  Full responsibility of the confidentiality of this discharge information lies with you and/or your care-partner.

## 2019-03-30 NOTE — Progress Notes (Signed)
History reviewed today 

## 2019-03-30 NOTE — Progress Notes (Signed)
Report given to PACU, vss 

## 2019-03-30 NOTE — Progress Notes (Signed)
Called to room to assist during endoscopic procedure.  Patient ID and intended procedure confirmed with present staff. Received instructions for my participation in the procedure from the performing physician.  

## 2019-03-30 NOTE — Op Note (Signed)
[name]: Tommy Ward Procedure Date: 03/30/2019 10:32 AM MRN: [id number] Endoscopist: Jackquline Denmark , MD Age: 78 Referring MD:  Date of Birth: [birth year] Gender: Male Account #: [id number] Procedure:                Upper GI endoscopy Indications:              Iron deficiency anemia, Suspected gastro-esophageal                            reflux disease Medicines:                Monitored Anesthesia Care Procedure:                Pre-Anesthesia Assessment:                           - Prior to the procedure, a History and Physical                            was performed, and patient medications and                            allergies were reviewed. The patient's tolerance of                            previous anesthesia was also reviewed. The risks                            and benefits of the procedure and the sedation                            options and risks were discussed with the patient.                            All questions were answered, and informed consent                            was obtained. Prior Anticoagulants: The patient has                            taken no previous anticoagulant or antiplatelet                            agents. ASA Grade Assessment: II - A patient with                            mild systemic disease. After reviewing the risks                            and benefits, the patient was deemed in                            satisfactory condition to undergo the procedure.                           -  Prior to the procedure, a History and Physical                            was performed, and patient medications and                            allergies were reviewed. The patient's tolerance of                            previous anesthesia was also reviewed. The risks                            and benefits of the procedure and the sedation                            options and risks were discussed with the patient.                           All questions were answered, and informed consent                            was obtained. Prior Anticoagulants: The patient has                            taken no previous anticoagulant or antiplatelet                            agents. ASA Grade Assessment: II - A patient with                            mild systemic disease. After reviewing the risks                            and benefits, the patient was deemed in                            satisfactory condition to undergo the procedure.                           After obtaining informed consent, the endoscope was                            passed under direct vision. Throughout the                            procedure, the patient's blood pressure, pulse, and                            oxygen saturations were monitored continuously. The                            Endoscope was introduced through the mouth, and  advanced to the second part of duodenum. The upper                            GI endoscopy was accomplished without difficulty.                            The patient tolerated the procedure well. Scope In: Scope Out: Findings:                 The esophagus was mildly tortuous especially in the                            distal one fourth of the esophagus. Incidental                            inlet patch was noted in the proximal esophagus.                            The esophagus was otherwise normal with a                            well-defined Z line at 38 cm.                           Localized mild inflammation characterized by                            erythema was found in the gastric antrum. Biopsies                            were taken with a cold forceps for histology from                            body antrum and fundus. Estimated blood loss: none.                           A few (2-3, measuring 2 to 4 mm) localized erosions                            without  bleeding were found in the duodenal bulb.                            Biopsies for histology were taken with a cold                            forceps for evaluation of celiac disease. Estimated                            blood loss: none. Complications:            No immediate complications. Estimated Blood Loss:     Estimated blood loss: none. Impression:               -Duodenal erosions.                           -  Mild gastritis (biopsied)                           -Presbyesophagus. Recommendation:           - Patient has a contact number available for                            emergencies. The signs and symptoms of potential                            delayed complications were discussed with the                            patient. Return to normal activities tomorrow.                            Written discharge instructions were provided to the                            patient.                           - Resume previous diet.                           - Continue omeprazole for now.                           - No ibuprofen, naproxen, or other non-steroidal                            anti-inflammatory drugs.                           - Await pathology results.                           - Return to GI clinic in 12 weeks.                           - Do recommend nephrology consultation for CRI if                            not done already (if OK with Dr Jeryl Columbia). Pt will                            get it touch with her. Jackquline Denmark, MD 03/30/2019 11:12:30 AM This report has been signed electronically.

## 2019-03-30 NOTE — Op Note (Signed)
[facility] Patient Name: Tommy Ward Procedure Date: 03/30/2019 10:32 AM MRN: [id number] Endoscopist: Jackquline Denmark , MD Age: 78 Referring MD:  Date of Birth: 1940-10-31 Gender: Male Account #: [id number] Procedure:                Colonoscopy Indications:              H/O Rectal bleeding. IDA Medicines:                Monitored Anesthesia Care Procedure:                Pre-Anesthesia Assessment:                           - Prior to the procedure, a History and Physical                            was performed, and patient medications and                            allergies were reviewed. The patient's tolerance of                            previous anesthesia was also reviewed. The risks                            and benefits of the procedure and the sedation                            options and risks were discussed with the patient.                            All questions were answered, and informed consent                            was obtained. Prior Anticoagulants: The patient has                            taken no previous anticoagulant or antiplatelet                            agents. ASA Grade Assessment: II - A patient with                            mild systemic disease. After reviewing the risks                            and benefits, the patient was deemed in                            satisfactory condition to undergo the procedure.                           After obtaining informed consent, the colonoscope  was passed under direct vision. Throughout the                            procedure, the patient's blood pressure, pulse, and                            oxygen saturations were monitored continuously. The                            Colonoscope was introduced through the anus and                            advanced to the 2 cm into the ileum. The                            colonoscopy was performed without difficulty. The                           patient tolerated the procedure well. The quality                            of the bowel preparation was good. The terminal                            ileum, ileocecal valve, appendiceal orifice, and                            rectum were photographed. Scope In: 10:50:35 AM Scope Out: 10:58:27 AM Scope Withdrawal Time: 0 hours 5 minutes 24 seconds  Total Procedure Duration: 0 hours 7 minutes 52 seconds  Findings:                 A few rare small-mouthed diverticula were found in                            the sigmoid colon.                           A few small localized telangiectasia without                            bleeding were found in the anterior rectum c/w very                            minimal radiation-induced changes. No bleeding. No                            need for APC treatment                           Non-bleeding internal hemorrhoids were found during                            retroflexion. The hemorrhoids were small.  The terminal ileum appeared normal.                           The exam was otherwise without abnormality on                            direct and retroflexion views. Complications:            No immediate complications. Estimated Blood Loss:     Estimated blood loss: none. Impression:               -Mild sigmoid diverticulosis.                           -Minimal radiation-induced changes in the rectum.                            No bleeding.                           -Small internal hemorrhoids                           -Otherwise normal colonoscopy to TI. No active                            bleeding. Recommendation:           - Patient has a contact number available for                            emergencies. The signs and symptoms of potential                            delayed complications were discussed with the                            patient. Return to normal activities tomorrow.                             Written discharge instructions were provided to the                            patient.                           - High fiber diet. Avoid constipation. If he has                            hard stools, he can start taking stool softeners                            1/day.                           - Continue present medications.                           -  Return to GI clinic in 12 weeks. Jackquline Denmark, MD 03/30/2019 11:06:04 AM This report has been signed electronically.

## 2019-03-30 NOTE — Progress Notes (Signed)
Temp , VS CW

## 2019-04-01 ENCOUNTER — Telehealth: Payer: Self-pay | Admitting: *Deleted

## 2019-04-01 ENCOUNTER — Telehealth: Payer: Self-pay

## 2019-04-01 NOTE — Telephone Encounter (Signed)
Second follow up phone call attempt, no aswer.

## 2019-04-01 NOTE — Telephone Encounter (Signed)
First follow up call attempt.  No answer.

## 2019-04-02 DIAGNOSIS — C7951 Secondary malignant neoplasm of bone: Secondary | ICD-10-CM

## 2019-04-02 DIAGNOSIS — C61 Malignant neoplasm of prostate: Secondary | ICD-10-CM

## 2019-04-03 ENCOUNTER — Encounter: Payer: Self-pay | Admitting: Gastroenterology

## 2019-04-08 ENCOUNTER — Other Ambulatory Visit: Payer: BC Managed Care – PPO

## 2019-04-14 ENCOUNTER — Other Ambulatory Visit: Payer: BC Managed Care – PPO

## 2019-04-27 ENCOUNTER — Other Ambulatory Visit: Payer: Self-pay

## 2019-04-27 ENCOUNTER — Encounter: Payer: Self-pay | Admitting: Cardiology

## 2019-04-27 ENCOUNTER — Ambulatory Visit (INDEPENDENT_AMBULATORY_CARE_PROVIDER_SITE_OTHER): Payer: BC Managed Care – PPO | Admitting: Cardiology

## 2019-04-27 VITALS — BP 102/58 | HR 68 | Ht 69.0 in | Wt 190.8 lb

## 2019-04-27 DIAGNOSIS — E7849 Other hyperlipidemia: Secondary | ICD-10-CM

## 2019-04-27 DIAGNOSIS — F17210 Nicotine dependence, cigarettes, uncomplicated: Secondary | ICD-10-CM

## 2019-04-27 DIAGNOSIS — I10 Essential (primary) hypertension: Secondary | ICD-10-CM

## 2019-04-27 MED ORDER — ROSUVASTATIN CALCIUM 10 MG PO TABS: 10.0000 mg | ORAL_TABLET | Freq: Every day | ORAL | 3 refills | Status: DC

## 2019-04-27 NOTE — Patient Instructions (Signed)
Medication Instructions:  Your physician has recommended you make the following change in your medication:  START rosuvastatin 10 mg (1 tablet) once daily If you need a refill on your cardiac medications before your next appointment, please call your pharmacy.   Lab work: Your physician recommends that you have a BMP and hepatic drawn today  If you have labs (blood work) drawn today and your tests are completely normal, you will receive your results only by: Marland Kitchen MyChart Message (if you have MyChart) OR . A paper copy in the mail If you have any lab test that is abnormal or we need to change your treatment, we will call you to review the results.  Testing/Procedures: NONE  Follow-Up: At [facility], you and your health needs are our priority.  As part of our continuing mission to provide you with exceptional heart care, we have created designated Provider Care Teams.  These Care Teams include your primary Cardiologist (physician) and Advanced Practice Providers (APPs -  Physician Assistants and Nurse Practitioners) who all work together to provide you with the care you need, when you need it. You will need a follow up appointment in 2 months.

## 2019-04-27 NOTE — Progress Notes (Signed)
Cardiology Office Note:    Date:  04/27/2019   ID:  Tommy Ward, DOB December 12, 1940, MRN [id number]  PCP:  Serita Grammes, MD  Cardiologist:  Jenean Lindau, MD   Referring MD: Serita Grammes, MD    ASSESSMENT:    1. Essential hypertension   2. Familial hyperlipidemia   3. 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 Primary prevention stressed with the patient.  Importance of compliance with diet and medication stressed and he vocalized understanding. 2. Stress test report was discussed with the patient at extensive length.  Details are mentioned below.  To understand the wall motion better we will get an echocardiogram. 3. Familial dyslipidemia: He is intolerant to statins.  He has not tolerated his atorvastatin well at all.  We will start rosuvastatin 10 mg daily he will have a Chem-7 and liver checked today.  He will be back in 6 weeks for liver lipid check.  If he is not tolerating rosuvastatin then we will have to move to injectable lipid-lowering medications. 4. Cigarette smoking: I spent 5 minutes with the patient discussing solely about smoking. Smoking cessation was counseled. I suggested to the patient also different medications and pharmacological interventions. Patient is keen to try stopping on its own at this time. He will get back to me if he needs any further assistance in this matter. 5. Patient will be seen in follow-up appointment in 2 months or earlier if the patient has any concerns    Medication Adjustments/Labs and Tests Ordered: Current medicines are reviewed at length with the patient today.  Concerns regarding medicines are outlined above.  No orders of the defined types were placed in this encounter.  No orders of the defined types were placed in this encounter.    Chief Complaint  Patient presents with  . Follow-up     History of Present Illness:    Tommy Ward is a 78 y.o. male.  Patient has  past medical history of essential hypertension, dyslipidemia with extremely high triglycerides, active heavy smoker smokes half pack a day.  His stress test did not reveal any evidence of ischemia and there was a fixed defect.  Patient denies any problems at this time and takes care of activities of daily living.  No chest pain orthopnea or PND.  At the time of my evaluation, the patient is alert awake oriented and in no distress.  Past Medical History:  Diagnosis Date  . Cancer ([place]) 08/2008   prostate ca  . Cataract 11/2013   bilateral  . GERD (gastroesophageal reflux disease)   . Hyperlipidemia   . Hypertension   . S/P radiation therapy > 12 wks ago 2010   prostate CA    Past Surgical History:  Procedure Laterality Date  . CHOLECYSTECTOMY  01/2003   lap chole  . left hip repaired      Current Medications: Current Meds  Medication Sig  . atorvastatin (LIPITOR) 20 MG tablet Take 1 tablet (20 mg total) by mouth daily.  . cholecalciferol (VITAMIN D3) 25 MCG (1000 UT) tablet Take 1,000 Units by mouth daily.  Marland Kitchen lisinopril (ZESTRIL) 40 MG tablet Take 1 tablet by mouth daily.  . meclizine (ANTIVERT) 25 MG tablet Take 1 tablet by mouth daily.  . metoprolol succinate (TOPROL-XL) 50 MG 24 hr tablet Take 1 tablet by mouth daily.  Marland Kitchen omeprazole (PRILOSEC) 40 MG capsule Take 1 capsule by mouth daily.  Marland Kitchen  pregabalin (LYRICA) 75 MG capsule Take 1 capsule by mouth daily.  Marland Kitchen PREVALITE 4 g packet 4 g.  . tamsulosin (FLOMAX) 0.4 MG CAPS capsule Take 1 capsule by mouth daily.  Marland Kitchen thiamine (VITAMIN B-1) 100 MG tablet Take 100 mg by mouth daily.     Allergies:   Patient has no known allergies.   Social History   Socioeconomic History  . Marital status: Married    Spouse name: Not on file  . Number of children: Not on file  . Years of education: Not on file  . Highest education level: Not on file  Occupational History  . Not on file  Social Needs  . Financial resource strain: Not on file   . Food insecurity    Worry: Not on file    Inability: Not on file  . Transportation needs    Medical: Not on file    Non-medical: Not on file  Tobacco Use  . Smoking status: Current Every Day Smoker    Packs/day: 0.75    Years: 50.00    Pack years: 37.50  . Smokeless tobacco: Never Used  Substance and Sexual Activity  . Alcohol use: Not Currently  . Drug use: Never  . Sexual activity: Not on file  Lifestyle  . Physical activity    Days per week: Not on file    Minutes per session: Not on file  . Stress: Not on file  Relationships  . Social Herbalist on phone: Not on file    Gets together: Not on file    Attends religious service: Not on file    Active member of club or organization: Not on file    Attends meetings of clubs or organizations: Not on file    Relationship status: Not on file  Other Topics Concern  . Not on file  Social History Narrative  . Not on file     Family History: The patient's family history includes Heart attack in his brother; Leukemia in his mother; Prostate cancer in his father. There is no history of Colon cancer, Rectal cancer, Stomach cancer, or Esophageal cancer.  ROS:   Please see the history of present illness.    All other systems reviewed and are negative.  EKGs/Labs/Other Studies Reviewed:    The following studies were reviewed today: Study Highlights   The left ventricular ejection fraction is moderately decreased (30-44%).  Nuclear stress EF: 40%.  There was no ST segment deviation noted during stress.  Defect 1: There is a large defect of severe severity present in the basal anteroseptal, basal inferoseptal, mid anteroseptal, mid inferoseptal, apical anterior, apical septal and apex location.  This is an intermediate risk study.  Findings consistent with prior myocardial infarction.  NO evidence of ischemia.        Recent Labs: 02/25/2019: ALT 17 03/03/2019: BUN 42; Creatinine, Ser 1.81; Potassium 5.0;  Sodium 141  Recent Lipid Panel    Component Value Date/Time   CHOL 400 (H) 02/25/2019 0905   TRIG 295 (H) 02/25/2019 0905   HDL 51 02/25/2019 0905   CHOLHDL 7.8 (H) 02/25/2019 0905   LDLCALC 290 (H) 02/25/2019 0905    Physical Exam:    VS:  BP (!) 102/58   Pulse 68   Ht 5\' 9"  (1.753 m)   Wt 190 lb 12.8 oz (86.5 kg)   SpO2 97%   BMI 28.18 kg/m     Wt Readings from Last 3 Encounters:  04/27/19 190 lb  12.8 oz (86.5 kg)  03/30/19 187 lb (84.8 kg)  03/02/19 187 lb (84.8 kg)     GEN: Patient is in no acute distress HEENT: Normal NECK: No JVD; No carotid bruits LYMPHATICS: No lymphadenopathy CARDIAC: Hear sounds regular, 2/6 systolic murmur at the apex. RESPIRATORY:  Clear to auscultation without rales, wheezing or rhonchi  ABDOMEN: Soft, non-tender, non-distended MUSCULOSKELETAL:  No edema; No deformity  SKIN: Warm and dry NEUROLOGIC:  Alert and oriented x 3 PSYCHIATRIC:  Normal affect   Signed, Jenean Lindau, MD  04/27/2019 11:35 AM    [place]

## 2019-04-28 LAB — BASIC METABOLIC PANEL
BUN/Creatinine Ratio: 21 (ref 10–24)
BUN: 44 mg/dL — ABNORMAL HIGH (ref 8–27)
CO2: 19 mmol/L — ABNORMAL LOW (ref 20–29)
Calcium: 10.8 mg/dL — ABNORMAL HIGH (ref 8.6–10.2)
Chloride: 106 mmol/L (ref 96–106)
Creatinine, Ser: 2.06 mg/dL — ABNORMAL HIGH (ref 0.76–1.27)
GFR calc Af Amer: 35 mL/min/{1.73_m2} — ABNORMAL LOW (ref >59)
GFR calc non Af Amer: 30 mL/min/{1.73_m2} — ABNORMAL LOW (ref >59)
Glucose: 105 mg/dL — ABNORMAL HIGH (ref 65–99)
Potassium: 5.5 mmol/L — ABNORMAL HIGH (ref 3.5–5.2)
Sodium: 139 mmol/L (ref 134–144)

## 2019-04-28 LAB — HEPATIC FUNCTION PANEL
ALT: 8 IU/L (ref 0–44)
AST: 14 IU/L (ref 0–40)
Albumin: 4.4 g/dL (ref 3.7–4.7)
Alkaline Phosphatase: 71 IU/L (ref 39–117)
Bilirubin Total: 0.3 mg/dL (ref 0.0–1.2)
Bilirubin, Direct: 0.08 mg/dL (ref 0.00–0.40)
Total Protein: 6.5 g/dL (ref 6.0–8.5)

## 2019-05-13 ENCOUNTER — Telehealth: Payer: Self-pay

## 2019-05-13 NOTE — Telephone Encounter (Signed)
-----   Message from Jenean Lindau, MD sent at 04/28/2019  1:58 PM EDT ----- The results of the study is unremarkable.  Renal function is stable.  His potassium is elevated as usual.  He needs to have a low potassium diet.  Send a copy to his primary care physician and let her nurse know.  The need to follow his potassium from now on.  Please inform patient. I will discuss in detail at next appointment. Cc  primary care/referring physician Jenean Lindau, MD 04/28/2019 1:57 PM

## 2019-05-13 NOTE — Telephone Encounter (Signed)
Results relayed, patient updated. Left message on Dr. Roberts Gaudy nurse line to check fax machine for these results. Copy sent to Dr. Jeryl Columbia.

## 2019-05-18 ENCOUNTER — Ambulatory Visit (INDEPENDENT_AMBULATORY_CARE_PROVIDER_SITE_OTHER): Payer: BC Managed Care – PPO

## 2019-05-18 ENCOUNTER — Other Ambulatory Visit: Payer: Self-pay

## 2019-05-18 DIAGNOSIS — R0609 Other forms of dyspnea: Secondary | ICD-10-CM

## 2019-05-18 NOTE — Progress Notes (Signed)
Complete echocardiogram has been performed.  Jimmy Sipriano Fendley RDCS, RVT 

## 2019-05-20 DIAGNOSIS — N183 Chronic kidney disease, stage 3 unspecified: Secondary | ICD-10-CM | POA: Insufficient documentation

## 2019-05-20 DIAGNOSIS — R931 Abnormal findings on diagnostic imaging of heart and coronary circulation: Secondary | ICD-10-CM

## 2019-05-20 DIAGNOSIS — I129 Hypertensive chronic kidney disease with stage 1 through stage 4 chronic kidney disease, or unspecified chronic kidney disease: Secondary | ICD-10-CM

## 2019-05-20 DIAGNOSIS — N184 Chronic kidney disease, stage 4 (severe): Secondary | ICD-10-CM | POA: Insufficient documentation

## 2019-05-20 HISTORY — DX: Hypertensive chronic kidney disease with stage 1 through stage 4 chronic kidney disease, or unspecified chronic kidney disease: I12.9

## 2019-05-20 HISTORY — DX: Abnormal findings on diagnostic imaging of heart and coronary circulation: R93.1

## 2019-05-20 HISTORY — DX: Chronic kidney disease, stage 3 unspecified: N18.30

## 2019-05-27 ENCOUNTER — Telehealth: Payer: Self-pay

## 2019-05-27 NOTE — Telephone Encounter (Signed)
Results relayed, patient states that he gets labs drawn at Dr. Madalyn Rob office. No further questions.

## 2019-05-27 NOTE — Telephone Encounter (Signed)
-----   Message from Jenean Lindau, MD sent at 05/26/2019 11:23 AM EDT ----- Mildly diminished ejection fraction.  Continue current management.  Please make sure that the patient is getting blood work by primary care physician on a regular basis. ----- Message ----- From: Ashok Norris, RN Sent: 05/26/2019  10:46 AM EDT To: Jenean Lindau, MD, Beckey Rutter, RN

## 2019-06-10 DIAGNOSIS — C7951 Secondary malignant neoplasm of bone: Secondary | ICD-10-CM

## 2019-06-10 DIAGNOSIS — C61 Malignant neoplasm of prostate: Secondary | ICD-10-CM

## 2019-07-01 ENCOUNTER — Ambulatory Visit: Payer: BC Managed Care – PPO | Admitting: Cardiology

## 2019-07-13 ENCOUNTER — Ambulatory Visit (INDEPENDENT_AMBULATORY_CARE_PROVIDER_SITE_OTHER): Payer: BC Managed Care – PPO | Admitting: Cardiology

## 2019-07-13 ENCOUNTER — Encounter: Payer: Self-pay | Admitting: Cardiology

## 2019-07-13 ENCOUNTER — Other Ambulatory Visit: Payer: Self-pay

## 2019-07-13 VITALS — BP 128/60 | HR 73 | Ht 69.0 in | Wt 197.0 lb

## 2019-07-13 DIAGNOSIS — I10 Essential (primary) hypertension: Secondary | ICD-10-CM | POA: Diagnosis not present

## 2019-07-13 DIAGNOSIS — F17210 Nicotine dependence, cigarettes, uncomplicated: Secondary | ICD-10-CM | POA: Diagnosis not present

## 2019-07-13 DIAGNOSIS — E7849 Other hyperlipidemia: Secondary | ICD-10-CM | POA: Diagnosis not present

## 2019-07-13 NOTE — Progress Notes (Signed)
Cardiology Office Note:    Date:  07/13/2019   ID:  Tommy Ward, DOB 1940/11/13, MRN [id number]  PCP:  Serita Grammes, MD  Cardiologist:  Jenean Lindau, MD   Referring MD: Serita Grammes, MD    ASSESSMENT:    1. Essential hypertension   2. Familial hyperlipidemia   3. 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 Familial dyslipidemia: Primary prevention stressed with the patient.  Importance of compliance with diet and medication stressed and he vocalized understanding.  He has tried at least 2 statins and is intolerant to them.  In view of this I suggested referring him to the lipid clinic for injectable lipid-lowering medications and he is agreeable for this. 2. Essential hypertension: Blood pressure stable 3. Renal insufficiency: Managed by his primary care physician.  His potassium is also managed by them and monitor closely. 4. Cigarette smoker: I spent 5 minutes with the patient discussing solely about smoking. Smoking cessation was counseled. I suggested to the patient also different medications and pharmacological interventions. Patient is keen to try stopping on its own at this time. He will get back to me if he needs any further assistance in this matter. 5. Patient will be seen in follow-up appointment in 6 months or earlier if the patient has any concerns    Medication Adjustments/Labs and Tests Ordered: Current medicines are reviewed at length with the patient today.  Concerns regarding medicines are outlined above.  No orders of the defined types were placed in this encounter.  No orders of the defined types were placed in this encounter.    Chief Complaint  Patient presents with  . Follow-up     History of Present Illness:    Tommy Ward is a 78 y.o. male.  Patient has past medical history of essential hypertension, dyslipidemia which is familial dyslipidemia and cigarette smoking.  He denies any  problems at this time and takes care of activities of daily living.  No chest pain orthopnea or PND.  He has renal insufficiency and chronic hyperkalemia.  At the time of my evaluation, the patient is alert awake oriented and in no distress.  Past Medical History:  Diagnosis Date  . Cancer ([place]) 08/2008   prostate ca  . Cataract 11/2013   bilateral  . GERD (gastroesophageal reflux disease)   . Hyperlipidemia   . Hypertension   . S/P radiation therapy > 12 wks ago 2010   prostate CA    Past Surgical History:  Procedure Laterality Date  . CHOLECYSTECTOMY  01/2003   lap chole  . left hip repaired      Current Medications: Current Meds  Medication Sig  . b complex vitamins capsule Take 1 capsule by mouth daily.  . cholecalciferol (VITAMIN D3) 25 MCG (1000 UT) tablet Take 1,000 Units by mouth daily.  Marland Kitchen lisinopril (ZESTRIL) 20 MG tablet Take 20 mg by mouth daily.  . meclizine (ANTIVERT) 25 MG tablet Take 1 tablet by mouth daily.  . metoprolol succinate (TOPROL-XL) 50 MG 24 hr tablet Take 1 tablet by mouth daily.  . Multiple Vitamins-Minerals (PRESERVISION AREDS PO) Take by mouth.  Marland Kitchen omeprazole (PRILOSEC) 40 MG capsule Take 1 capsule by mouth daily.  . pregabalin (LYRICA) 75 MG capsule Take 1 capsule by mouth daily.  Marland Kitchen PREVALITE 4 g packet 4 g.  . tamsulosin (FLOMAX) 0.4 MG CAPS capsule Take 1 capsule by mouth daily.  Allergies:   Statins   Social History   Socioeconomic History  . Marital status: Married    Spouse name: Not on file  . Number of children: Not on file  . Years of education: Not on file  . Highest education level: Not on file  Occupational History  . Not on file  Social Needs  . Financial resource strain: Not on file  . Food insecurity    Worry: Not on file    Inability: Not on file  . Transportation needs    Medical: Not on file    Non-medical: Not on file  Tobacco Use  . Smoking status: Current Every Day Smoker    Packs/day: 0.75    Years:  50.00    Pack years: 37.50  . Smokeless tobacco: Never Used  Substance and Sexual Activity  . Alcohol use: Not Currently  . Drug use: Never  . Sexual activity: Not on file  Lifestyle  . Physical activity    Days per week: Not on file    Minutes per session: Not on file  . Stress: Not on file  Relationships  . Social Herbalist on phone: Not on file    Gets together: Not on file    Attends religious service: Not on file    Active member of club or organization: Not on file    Attends meetings of clubs or organizations: Not on file    Relationship status: Not on file  Other Topics Concern  . Not on file  Social History Narrative  . Not on file     Family History: The patient's family history includes Heart attack in his brother; Leukemia in his mother; Prostate cancer in his father. There is no history of Colon cancer, Rectal cancer, Stomach cancer, or Esophageal cancer.  ROS:   Please see the history of present illness.    All other systems reviewed and are negative.  EKGs/Labs/Other Studies Reviewed:    The following studies were reviewed today: I discussed my findings with the patient at extensive length   Recent Labs: 04/27/2019: ALT 8; BUN 44; Creatinine, Ser 2.06; Potassium 5.5; Sodium 139  Recent Lipid Panel    Component Value Date/Time   CHOL 400 (H) 02/25/2019 0905   TRIG 295 (H) 02/25/2019 0905   HDL 51 02/25/2019 0905   CHOLHDL 7.8 (H) 02/25/2019 0905   LDLCALC 290 (H) 02/25/2019 0905    Physical Exam:    VS:  BP 128/60 (BP Location: Left Arm, Patient Position: Sitting, Cuff Size: Normal)   Pulse 73   Ht 5\' 9"  (1.753 m)   Wt 197 lb (89.4 kg)   SpO2 99%   BMI 29.09 kg/m     Wt Readings from Last 3 Encounters:  07/13/19 197 lb (89.4 kg)  04/27/19 190 lb 12.8 oz (86.5 kg)  03/30/19 187 lb (84.8 kg)     GEN: Patient is in no acute distress HEENT: Normal NECK: No JVD; No carotid bruits LYMPHATICS: No lymphadenopathy CARDIAC: Hear  sounds regular, 2/6 systolic murmur at the apex. RESPIRATORY:  Clear to auscultation without rales, wheezing or rhonchi  ABDOMEN: Soft, non-tender, non-distended MUSCULOSKELETAL:  No edema; No deformity  SKIN: Warm and dry NEUROLOGIC:  Alert and oriented x 3 PSYCHIATRIC:  Normal affect   Signed, Jenean Lindau, MD  07/13/2019 3:10 PM    [facility]

## 2019-07-13 NOTE — Patient Instructions (Signed)
Medication Instructions:  Your physician recommends that you continue on your current medications as directed. Please refer to the Current Medication list given to you today.  *If you need a refill on your cardiac medications before your next appointment, please call your pharmacy*  Lab Work: NONE  If you have labs (blood work) drawn today and your tests are completely normal, you will receive your results only by: Marland Kitchen MyChart Message (if you have MyChart) OR . A paper copy in the mail If you have any lab test that is abnormal or we need to change your treatment, we will call you to review the results.  Testing/Procedures  YOU have been referred to the lipid clinic, you will be contacted to schedule consultation  Follow-Up: At [facility], you and your health needs are our priority.  As part of our continuing mission to provide you with exceptional heart care, we have created designated Provider Care Teams.  These Care Teams include your primary Cardiologist (physician) and Advanced Practice Providers (APPs -  Physician Assistants and Nurse Practitioners) who all work together to provide you with the care you need, when you need it.  Your next appointment:   6 month(s)  The format for your next appointment:   In Person  Provider:   Jyl Heinz, MD

## 2019-07-13 NOTE — Addendum Note (Signed)
Addended by: Beckey Rutter on: 07/13/2019 03:27 PM   Modules accepted: Orders

## 2019-08-24 DIAGNOSIS — E875 Hyperkalemia: Secondary | ICD-10-CM

## 2019-08-24 DIAGNOSIS — E889 Metabolic disorder, unspecified: Secondary | ICD-10-CM

## 2019-08-24 DIAGNOSIS — N1832 Chronic kidney disease, stage 3b: Secondary | ICD-10-CM | POA: Insufficient documentation

## 2019-08-24 DIAGNOSIS — E559 Vitamin D deficiency, unspecified: Secondary | ICD-10-CM | POA: Insufficient documentation

## 2019-08-24 DIAGNOSIS — D631 Anemia in chronic kidney disease: Secondary | ICD-10-CM

## 2019-08-24 DIAGNOSIS — M898X9 Other specified disorders of bone, unspecified site: Secondary | ICD-10-CM

## 2019-08-24 HISTORY — DX: Metabolic disorder, unspecified: E88.9

## 2019-08-24 HISTORY — DX: Hyperkalemia: E87.5

## 2019-08-24 HISTORY — DX: Vitamin D deficiency, unspecified: E55.9

## 2019-08-24 HISTORY — DX: Chronic kidney disease, stage 3b: N18.32

## 2019-08-24 HISTORY — DX: Other specified disorders of bone, unspecified site: M89.8X9

## 2019-08-24 HISTORY — DX: Anemia in chronic kidney disease: D63.1

## 2019-09-10 DIAGNOSIS — C7951 Secondary malignant neoplasm of bone: Secondary | ICD-10-CM

## 2019-09-10 DIAGNOSIS — C61 Malignant neoplasm of prostate: Secondary | ICD-10-CM

## 2019-09-20 ENCOUNTER — Ambulatory Visit (INDEPENDENT_AMBULATORY_CARE_PROVIDER_SITE_OTHER): Payer: Medicare Other | Admitting: Internal Medicine

## 2019-09-20 ENCOUNTER — Other Ambulatory Visit: Payer: Self-pay

## 2019-09-20 ENCOUNTER — Encounter: Payer: Self-pay | Admitting: Internal Medicine

## 2019-09-20 VITALS — BP 113/57 | HR 68 | Ht 69.0 in | Wt 198.8 lb

## 2019-09-20 DIAGNOSIS — I252 Old myocardial infarction: Secondary | ICD-10-CM | POA: Diagnosis not present

## 2019-09-20 DIAGNOSIS — I10 Essential (primary) hypertension: Secondary | ICD-10-CM

## 2019-09-20 DIAGNOSIS — E7849 Other hyperlipidemia: Secondary | ICD-10-CM | POA: Diagnosis not present

## 2019-09-20 DIAGNOSIS — Z79899 Other long term (current) drug therapy: Secondary | ICD-10-CM

## 2019-09-20 DIAGNOSIS — I255 Ischemic cardiomyopathy: Secondary | ICD-10-CM | POA: Diagnosis not present

## 2019-09-20 MED ORDER — ASPIRIN EC 81 MG PO TBEC: 81.0000 mg | DELAYED_RELEASE_TABLET | Freq: Every day | ORAL | 3 refills | Status: DC

## 2019-09-20 NOTE — Patient Instructions (Addendum)
Medication Instructions:  START aspirin 81mg   Dr. Debara Pickett recommends Repatha 140mg /mL (PCSK9). This is an injectable cholesterol medication self-administered once every 14 days. This medication will need prior approval with your insurance company, which we will work on. If the medication is not approved initially, we may need to do an appeal with your insurance. We will keep you updated on this process.   If you need co-pay assistance, please look into the program at [employer].org >> disease funds >> hypercholesterolemia. This is an online application or you can call to completed.    *If you need a refill on your cardiac medications before your next appointment, please call your pharmacy*  Lab Work: FASTING LIPID PANEL in 3-4 months, before next lipid clinic visit If you have labs (blood work) drawn today and your tests are completely normal, you will receive your results only by: Marland Kitchen MyChart Message (if you have MyChart) OR . A paper copy in the mail If you have any lab test that is abnormal or we need to change your treatment, we will call you to review the results.  Follow-Up: At [facility], you and your health needs are our priority.  As part of our continuing mission to provide you with exceptional heart care, we have created designated Provider Care Teams.  These Care Teams include your primary Cardiologist (physician) and Advanced Practice Providers (APPs -  Physician Assistants and Nurse Practitioners) who all work together to provide you with the care you need, when you need it.  Your next appointment:  3 month(s) - lipid clinic  The format for your next appointment:    In Person  Provider:   You may see Pixie Casino, MD or one of the following Advanced Practice Providers on your designated Care Team:  Almyra Deforest, PA-C  Fabian Sharp, PA-C or Roby Lofts, [place]

## 2019-09-21 ENCOUNTER — Encounter: Payer: Self-pay | Admitting: Internal Medicine

## 2019-09-21 NOTE — Progress Notes (Addendum)
LIPID CLINIC CONSULT NOTE  Chief Complaint:  Manage dyslipidemia  Primary Care Physician: Serita Grammes, MD  Primary Cardiologist:  Pixie Casino, MD  HPI:  Tommy Ward is a 79 y.o. male who is being seen today for the evaluation of dyslipidemia at the request of Revankar, Reita Cliche, MD.  This is a pleasant 79 year old male patient of Dr. 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  Mr. Coppolino has a [place] heritage however he says most likely inherited is coronary artery disease and dyslipidemia from his mom and sister, both had high cholesterol though his mother died at age 105.  He was originally from [place] and was living in [place] prior to coming to [place].  He saw a cardiologist there named Dr. Erling Cruz in [place].  At one point he had stress testing which indicated a fixed defect suggestive of scar.  His LVEF was reduced between 40 to 45%.  Subsequently here he is also had repeat stress testing and echo which are confirmed evidence of prior heart attack.  This being said, he has never had a prior heart catheterization.  EKG today shows sinus rhythm with marked sinus arrhythmia and left bundle branch block, performed due to his coronary history which was not well elucidated in the chart.  He was also noted to be irregularly irregular on exam.  Most recently his lipid showed total cholesterol 400, triglycerides 295, HDL 51 and LDL 290.  In addition to his marked dyslipidemia, he reports significant statin intolerance to multiple different statins.  PMHx:  Past Medical History:  Diagnosis Date  . Cancer ([place]) 08/2008   prostate ca  . Cataract 11/2013   bilateral  . GERD (gastroesophageal reflux disease)   . Hyperlipidemia   . Hypertension   . S/P radiation therapy > 12 wks ago 2010   prostate CA    Past Surgical History:  Procedure Laterality Date  . CHOLECYSTECTOMY  01/2003   lap chole  . left hip repaired       FAMHx:  Family History  Problem Relation Age of Onset  . Leukemia Mother   . Prostate cancer Father   . Heart attack Brother   . Colon cancer Neg Hx   . Rectal cancer Neg Hx   . Stomach cancer Neg Hx   . Esophageal cancer Neg Hx     SOCHx:   reports that he has been smoking. He has a 37.50 pack-year smoking history. He has never used smokeless tobacco. He reports previous alcohol use. He reports that he does not use drugs.  ALLERGIES:  Allergies  Allergen Reactions  . Statins     Other reaction(s): Myalgias (intolerance)    ROS: Pertinent items noted in HPI and remainder of comprehensive ROS otherwise negative.  HOME MEDS: Current Outpatient Medications on File Prior to Visit  Medication Sig Dispense Refill  . b complex vitamins capsule Take 1 capsule by mouth daily.    . cholecalciferol (VITAMIN D3) 25 MCG (1000 UT) tablet Take 1,000 Units by mouth daily.    Marland Kitchen lisinopril (ZESTRIL) 20 MG tablet Take 20 mg by mouth daily.    . meclizine (ANTIVERT) 25 MG tablet Take 1 tablet by mouth daily.    . metoprolol succinate (TOPROL-XL) 50 MG 24 hr tablet Take 1 tablet by mouth daily.    . Multiple Vitamins-Minerals (PRESERVISION AREDS PO) Take by mouth.    Marland Kitchen omeprazole (PRILOSEC) 40 MG capsule Take 1 capsule  by mouth daily.    . pregabalin (LYRICA) 75 MG capsule Take 1 capsule by mouth daily.    Marland Kitchen PREVALITE 4 g packet 4 g.    . tamsulosin (FLOMAX) 0.4 MG CAPS capsule Take 1 capsule by mouth daily.     No current facility-administered medications on file prior to visit.    LABS/IMAGING: No results found for this or any previous visit (from the past 48 hour(s)). No results found.  LIPID PANEL:    Component Value Date/Time   CHOL 400 (H) 02/25/2019 0905   TRIG 295 (H) 02/25/2019 0905   HDL 51 02/25/2019 0905   CHOLHDL 7.8 (H) 02/25/2019 0905   LDLCALC 290 (H) 02/25/2019 0905    WEIGHTS: Wt Readings from Last 3 Encounters:  09/20/19 198 lb 12.8 oz (90.2 kg)  07/13/19  197 lb (89.4 kg)  04/27/19 190 lb 12.8 oz (86.5 kg)    VITALS: BP (!) 113/57   Pulse 68   Ht 5\' 9"  (1.753 m)   Wt 198 lb 12.8 oz (90.2 kg)   SpO2 97%   BMI 29.36 kg/m   EXAM: General appearance: alert and no distress Lungs: clear to auscultation bilaterally Heart: regular rate and rhythm, S1, S2 normal, no murmur, click, rub or gallop Abdomen: soft, non-tender; bowel sounds normal; no masses,  no organomegaly Extremities: extremities normal, atraumatic, no cyanosis or edema Pulses: 2+ and symmetric Skin: Skin color, texture, turgor normal. No rashes or lesions Neurologic: Grossly normal Psych: Pleasant  EKG: Sinus rhythm with marked sinus arrhythmia at 68, IVCD-personally reviewed  ASSESSMENT: 1. Coronary artery disease with remote infarct 2. Ischemic cardiomyopathy 3. Probable FH, LDL 290 4. Statin intolerance-myalgias  PLAN: 1.   Mr. Erven has a history of coronary disease with remote infarct although never had a cath.  Based on his echo results there is room for uptitrating his medical therapy. I'm concerned about FH given his very high LDL cholesterol. He has also been statin intolerant. Will recommend Repatha 140 mg q2 weeks. Repeat lipids in 3-4 months and follow-up with me afterward. May likely need additional therapy to get near targets.  Thanks for the kind referral.  Pixie Casino, MD, FACC, Industry Director of the Advanced Lipid Disorders &  Cardiovascular Risk Reduction Clinic Diplomate of the American Board of Clinical Lipidology Attending Cardiologist  Direct Dial: [phone]  Fax: [phone]  Website:  [email or web address] 09/21/2019, 10:29 PM

## 2019-09-23 ENCOUNTER — Telehealth: Payer: Self-pay | Admitting: Internal Medicine

## 2019-09-23 NOTE — Telephone Encounter (Signed)
PA for repatha submitted via CMM (Key: [id number])

## 2019-09-23 NOTE — Telephone Encounter (Signed)
-----   Message from Fidel Levy, RN sent at 09/20/2019 10:49 AM EST ----- Regarding: repatha PA ID: [id number] BIN: [id number] PCN: [id number] RxGrp: [id number]

## 2019-09-24 MED ORDER — REPATHA SURECLICK 140 MG/ML ~~LOC~~ SOAJ: 1.0000 | SUBCUTANEOUS | 11 refills | Status: DC

## 2019-09-24 NOTE — Telephone Encounter (Signed)
Patient returned call. Aware that med has been approved. Rx sent to [facility] per request.

## 2019-09-24 NOTE — Telephone Encounter (Signed)
LMTCB on [id number] (Home)  Need to notify patient of approval and get OK to send Rx to preferred pharmacy

## 2019-09-24 NOTE — Telephone Encounter (Signed)
Patient is approved - effective from 09/23/2019 through 09/22/2020

## 2019-09-24 NOTE — Addendum Note (Signed)
Addended by: Fidel Levy on: 09/24/2019 11:05 AM   Modules accepted: Orders

## 2019-09-28 ENCOUNTER — Telehealth: Payer: Self-pay | Admitting: Internal Medicine

## 2019-09-28 NOTE — Telephone Encounter (Signed)
Another of yours?

## 2019-09-28 NOTE — Telephone Encounter (Signed)
Patient states that he is requesting instructions on how to take Evolocumab (REPATHA SURECLICK) 409 MG/ML SOAJ medication. Please call to discuss.

## 2019-09-29 NOTE — Telephone Encounter (Signed)
Patient called about Repatha. He reports his co-pay was $257 and he cannot afford this. Reminded him of the [employer] that he can apply for. He will look into this and check with [facility] mail order to see if they will accept this assistance.

## 2019-11-25 DIAGNOSIS — N179 Acute kidney failure, unspecified: Secondary | ICD-10-CM

## 2019-11-25 HISTORY — DX: Acute kidney failure, unspecified: N17.9

## 2019-11-25 HISTORY — DX: Other disorders of phosphorus metabolism: E83.39

## 2019-12-09 DIAGNOSIS — C61 Malignant neoplasm of prostate: Secondary | ICD-10-CM

## 2019-12-09 DIAGNOSIS — C7951 Secondary malignant neoplasm of bone: Secondary | ICD-10-CM

## 2019-12-30 ENCOUNTER — Other Ambulatory Visit: Payer: Self-pay

## 2019-12-30 ENCOUNTER — Ambulatory Visit (INDEPENDENT_AMBULATORY_CARE_PROVIDER_SITE_OTHER): Payer: Medicare Other | Admitting: Internal Medicine

## 2019-12-30 ENCOUNTER — Encounter: Payer: Self-pay | Admitting: Internal Medicine

## 2019-12-30 VITALS — BP 128/74 | HR 57 | Ht 69.0 in | Wt 197.0 lb

## 2019-12-30 DIAGNOSIS — E7849 Other hyperlipidemia: Secondary | ICD-10-CM

## 2019-12-30 DIAGNOSIS — I252 Old myocardial infarction: Secondary | ICD-10-CM | POA: Diagnosis not present

## 2019-12-30 DIAGNOSIS — I255 Ischemic cardiomyopathy: Secondary | ICD-10-CM | POA: Diagnosis not present

## 2019-12-30 MED ORDER — EZETIMIBE 10 MG PO TABS: 10.0000 mg | ORAL_TABLET | Freq: Every day | ORAL | 3 refills | Status: DC

## 2019-12-30 NOTE — Progress Notes (Signed)
LIPID CLINIC CONSULT NOTE  Chief Complaint:  Follow-up dyslipidemia  Primary Care Physician: Serita Grammes, MD  Primary Cardiologist:  Pixie Casino, MD  HPI:  Tommy Ward is a 79 y.o. male who is being seen today for the evaluation of dyslipidemia at the request of Serita Grammes, MD.  This is a pleasant 79 year old male patient of Dr. 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  Tommy Ward has a [place] heritage however he says most likely inherited is coronary artery disease and dyslipidemia from his mom and sister, both had high cholesterol though his mother died at age 65.  He was originally from [place] and was living in [place] prior to coming to [place].  He saw a cardiologist there named Dr. Erling Cruz in [place].  At one point he had stress testing which indicated a fixed defect suggestive of scar.  His LVEF was reduced between 40 to 45%.  Subsequently here he is also had repeat stress testing and echo which are confirmed evidence of prior heart attack.  This being said, he has never had a prior heart catheterization.  EKG today shows sinus rhythm with marked sinus arrhythmia and left bundle branch block, performed due to his coronary history which was not well elucidated in the chart.  He was also noted to be irregularly irregular on exam.  Most recently his lipid showed total cholesterol 400, triglycerides 295, HDL 51 and LDL 290.  In addition to his marked dyslipidemia, he reports significant statin intolerance to multiple different statins.  12/30/2019  Tommy Ward returns today for follow-up.  Overall he is doing well on Repatha.  He has had marked reduction in his lipids.  Most recently his lipids showed total cholesterol 255, triglycerides 398, HDL 44 and LDL 131.  This is down from 290.  Total cholesterol was over 400.  He denies any side effects with the medication.  I would like to see if we could drive his  cholesterol a little lower.  We talked about different options including possibly adding ezetimibe or Vascepa.  Cost may be an issue as the PCSK9 inhibitor is expensive.  PMHx:  Past Medical History:  Diagnosis Date  . Cancer ([place]) 08/2008   prostate ca  . Cataract 11/2013   bilateral  . GERD (gastroesophageal reflux disease)   . Hyperlipidemia   . Hypertension   . S/P radiation therapy > 12 wks ago 2010   prostate CA    Past Surgical History:  Procedure Laterality Date  . CHOLECYSTECTOMY  01/2003   lap chole  . left hip repaired      FAMHx:  Family History  Problem Relation Age of Onset  . Leukemia Mother   . Prostate cancer Father   . Heart attack Brother   . Colon cancer Neg Hx   . Rectal cancer Neg Hx   . Stomach cancer Neg Hx   . Esophageal cancer Neg Hx     SOCHx:   reports that he has been smoking. He has a 37.50 pack-year smoking history. He has never used smokeless tobacco. He reports previous alcohol use. He reports that he does not use drugs.  ALLERGIES:  Allergies  Allergen Reactions  . Statins     Other reaction(s): Myalgias (intolerance)    ROS: Pertinent items noted in HPI and remainder of comprehensive ROS otherwise negative.  HOME MEDS: Current Outpatient Medications on File Prior to Visit  Medication Sig Dispense Refill  .  aspirin EC 81 MG tablet Take 1 tablet (81 mg total) by mouth daily. 90 tablet 3  . b complex vitamins capsule Take 1 capsule by mouth daily.    . cholecalciferol (VITAMIN D3) 25 MCG (1000 UT) tablet Take 1,000 Units by mouth daily.    . Evolocumab (REPATHA SURECLICK) 759 MG/ML SOAJ Inject 1 Dose into the skin every 14 (fourteen) days. 2 pen 11  . lisinopril (ZESTRIL) 20 MG tablet Take 20 mg by mouth daily.    . meclizine (ANTIVERT) 25 MG tablet Take 1 tablet by mouth daily.    . metoprolol succinate (TOPROL-XL) 50 MG 24 hr tablet Take 1 tablet by mouth daily.    . Multiple Vitamins-Minerals (PRESERVISION AREDS PO) Take by  mouth.    Marland Kitchen omeprazole (PRILOSEC) 40 MG capsule Take 1 capsule by mouth daily.    . pregabalin (LYRICA) 75 MG capsule Take 1 capsule by mouth daily.    Marland Kitchen PREVALITE 4 g packet 4 g.    . tamsulosin (FLOMAX) 0.4 MG CAPS capsule Take 1 capsule by mouth daily.     No current facility-administered medications on file prior to visit.    LABS/IMAGING: No results found for this or any previous visit (from the past 48 hour(s)). No results found.  LIPID PANEL:    Component Value Date/Time   CHOL 400 (H) 02/25/2019 0905   TRIG 295 (H) 02/25/2019 0905   HDL 51 02/25/2019 0905   CHOLHDL 7.8 (H) 02/25/2019 0905   LDLCALC 290 (H) 02/25/2019 0905    WEIGHTS: Wt Readings from Last 3 Encounters:  12/30/19 197 lb (89.4 kg)  09/20/19 198 lb 12.8 oz (90.2 kg)  07/13/19 197 lb (89.4 kg)    VITALS: BP 128/74   Pulse (!) 57   Ht 5\' 9"  (1.753 m)   Wt 197 lb (89.4 kg)   SpO2 96%   BMI 29.09 kg/m   EXAM: Deferred  EKG: Deferred  ASSESSMENT: 1. Coronary artery disease with remote infarct 2. Ischemic cardiomyopathy 3. Probable FH, LDL 290 4. Statin intolerance-myalgias  PLAN: 1.   Tommy Ward has had significant improvement in his dyslipidemia on Repatha.  His LDL is now 131.  I like to try to drive and lower and would recommend adding ezetimibe 10 mg daily to his regimen.  We will repeat lipids in 6 months and follow-up at that time.  Pixie Casino, MD, [facility], [place] Director of the Advanced Lipid Disorders &  Cardiovascular Risk Reduction Clinic Diplomate of the American Board of Clinical Lipidology Attending Cardiologist  Direct Dial: [phone]  Fax: [phone]  Website:  [email or web address] 12/30/2019, 9:02 AM

## 2019-12-30 NOTE — Patient Instructions (Addendum)
Medication Instructions:  START zetia 10mg  daily Continue all other current medications  *If you need a refill on your cardiac medications before your next appointment, please call your pharmacy*   Lab Work: FASTING lab work in 6 months to check cholesterol If you have labs (blood work) drawn today and your tests are completely normal, you will receive your results only by: Marland Kitchen MyChart Message (if you have MyChart) OR . A paper copy in the mail If you have any lab test that is abnormal or we need to change your treatment, we will call you to review the results.   Testing/Procedures: NONE   Follow-Up: At [facility], you and your health needs are our priority.  As part of our continuing mission to provide you with exceptional heart care, we have created designated Provider Care Teams.  These Care Teams include your primary Cardiologist (physician) and Advanced Practice Providers (APPs -  Physician Assistants and Nurse Practitioners) who all work together to provide you with the care you need, when you need it.  We recommend signing up for the patient portal called "MyChart".  Sign up information is provided on this After Visit Summary.  MyChart is used to connect with patients for Virtual Visits (Telemedicine).  Patients are able to view lab/test results, encounter notes, upcoming appointments, etc.  Non-urgent messages can be sent to your provider as well.   To learn more about what you can do with MyChart, go to [email or web address].    Your next appointment:   6 month(s) - lipid clinic  The format for your next appointment:   In Person  Provider:   K. Mali Hilty, MD   Other Instructions

## 2020-03-09 DIAGNOSIS — C7951 Secondary malignant neoplasm of bone: Secondary | ICD-10-CM

## 2020-06-02 ENCOUNTER — Encounter: Payer: Self-pay | Admitting: Pharmacist

## 2020-06-02 DIAGNOSIS — C7952 Secondary malignant neoplasm of bone marrow: Secondary | ICD-10-CM | POA: Insufficient documentation

## 2020-06-02 DIAGNOSIS — C7951 Secondary malignant neoplasm of bone: Secondary | ICD-10-CM | POA: Insufficient documentation

## 2020-06-02 DIAGNOSIS — C61 Malignant neoplasm of prostate: Secondary | ICD-10-CM | POA: Insufficient documentation

## 2020-06-09 ENCOUNTER — Other Ambulatory Visit: Payer: Self-pay | Admitting: Hematology and Oncology

## 2020-06-09 ENCOUNTER — Inpatient Hospital Stay: Payer: Medicare Other

## 2020-06-09 LAB — COMPREHENSIVE METABOLIC PANEL
Albumin: 3.8 (ref 3.5–5.0)
Calcium: 9.3 (ref 8.7–10.7)

## 2020-06-09 LAB — BASIC METABOLIC PANEL
BUN: 34 — AB (ref 4–21)
CO2: 24 — AB (ref 13–22)
Chloride: 104 (ref 99–108)
Creatinine: 1.7 — AB (ref 0.6–1.3)
Glucose: 92
Potassium: 4.5 (ref 3.4–5.3)
Sodium: 136 — AB (ref 137–147)

## 2020-06-09 LAB — HEPATIC FUNCTION PANEL
ALT: 11 (ref 10–40)
AST: 27 (ref 14–40)
Alkaline Phosphatase: 79 (ref 25–125)
Bilirubin, Total: 0.8

## 2020-06-09 LAB — CBC AND DIFFERENTIAL
HCT: 41 (ref 41–53)
Hemoglobin: 13.7 (ref 13.5–17.5)
Platelets: 280 (ref 150–399)
WBC: 5.5

## 2020-06-09 LAB — CBC: RBC: 4.52 (ref 3.87–5.11)

## 2020-06-12 ENCOUNTER — Other Ambulatory Visit: Payer: Self-pay

## 2020-06-12 ENCOUNTER — Inpatient Hospital Stay: Payer: Medicare Other | Attending: Oncology

## 2020-06-12 VITALS — BP 126/62 | HR 65 | Temp 98.1°F | Resp 18

## 2020-06-12 DIAGNOSIS — C61 Malignant neoplasm of prostate: Secondary | ICD-10-CM

## 2020-06-12 DIAGNOSIS — C7951 Secondary malignant neoplasm of bone: Secondary | ICD-10-CM | POA: Diagnosis present

## 2020-06-12 MED ORDER — DENOSUMAB 120 MG/1.7ML ~~LOC~~ SOLN
120.0000 mg | Freq: Once | SUBCUTANEOUS | Status: AC
  Administered 2020-06-12: 120 mg via SUBCUTANEOUS

## 2020-06-12 MED ORDER — DENOSUMAB 120 MG/1.7ML ~~LOC~~ SOLN
SUBCUTANEOUS | Status: AC
  Filled 2020-06-12: qty 1.7

## 2020-06-12 NOTE — Patient Instructions (Signed)
Denosumab injection What is this medicine? DENOSUMAB (den oh sue mab) slows bone breakdown. Prolia is used to treat osteoporosis in women after menopause and in men, and in people who are taking corticosteroids for 6 months or more. Xgeva is used to treat a high calcium level due to cancer and to prevent bone fractures and other bone problems caused by multiple myeloma or cancer bone metastases. Xgeva is also used to treat giant cell tumor of the bone. This medicine may be used for other purposes; ask your health care provider or pharmacist if you have questions. COMMON BRAND NAME(S): Prolia, XGEVA What should I tell my health care provider before I take this medicine? They need to know if you have any of these conditions:  dental disease  having surgery or tooth extraction  infection  kidney disease  low levels of calcium or Vitamin D in the blood  malnutrition  on hemodialysis  skin conditions or sensitivity  thyroid or parathyroid disease  an unusual reaction to denosumab, other medicines, foods, dyes, or preservatives  pregnant or trying to get pregnant  breast-feeding How should I use this medicine? This medicine is for injection under the skin. It is given by a health care professional in a hospital or clinic setting. A special MedGuide will be given to you before each treatment. Be sure to read this information carefully each time. For Prolia, talk to your pediatrician regarding the use of this medicine in children. Special care may be needed. For Xgeva, talk to your pediatrician regarding the use of this medicine in children. While this drug may be prescribed for children as young as 13 years for selected conditions, precautions do apply. Overdosage: If you think you have taken too much of this medicine contact a poison control center or emergency room at once. NOTE: This medicine is only for you. Do not share this medicine with others. What if I miss a dose? It is  important not to miss your dose. Call your doctor or health care professional if you are unable to keep an appointment. What may interact with this medicine? Do not take this medicine with any of the following medications:  other medicines containing denosumab This medicine may also interact with the following medications:  medicines that lower your chance of fighting infection  steroid medicines like prednisone or cortisone This list may not describe all possible interactions. Give your health care provider a list of all the medicines, herbs, non-prescription drugs, or dietary supplements you use. Also tell them if you smoke, drink alcohol, or use illegal drugs. Some items may interact with your medicine. What should I watch for while using this medicine? Visit your doctor or health care professional for regular checks on your progress. Your doctor or health care professional may order blood tests and other tests to see how you are doing. Call your doctor or health care professional for advice if you get a fever, chills or sore throat, or other symptoms of a cold or flu. Do not treat yourself. This drug may decrease your body's ability to fight infection. Try to avoid being around people who are sick. You should make sure you get enough calcium and vitamin D while you are taking this medicine, unless your doctor tells you not to. Discuss the foods you eat and the vitamins you take with your health care professional. See your dentist regularly. Brush and floss your teeth as directed. Before you have any dental work done, tell your dentist you are   receiving this medicine. Do not become pregnant while taking this medicine or for 5 months after stopping it. Talk with your doctor or health care professional about your birth control options while taking this medicine. Women should inform their doctor if they wish to become pregnant or think they might be pregnant. There is a potential for serious side  effects to an unborn child. Talk to your health care professional or pharmacist for more information. What side effects may I notice from receiving this medicine? Side effects that you should report to your doctor or health care professional as soon as possible:  allergic reactions like skin rash, itching or hives, swelling of the face, lips, or tongue  bone pain  breathing problems  dizziness  jaw pain, especially after dental work  redness, blistering, peeling of the skin  signs and symptoms of infection like fever or chills; cough; sore throat; pain or trouble passing urine  signs of low calcium like fast heartbeat, muscle cramps or muscle pain; pain, tingling, numbness in the hands or feet; seizures  unusual bleeding or bruising  unusually weak or tired Side effects that usually do not require medical attention (report to your doctor or health care professional if they continue or are bothersome):  constipation  diarrhea  headache  joint pain  loss of appetite  muscle pain  runny nose  tiredness  upset stomach This list may not describe all possible side effects. Call your doctor for medical advice about side effects. You may report side effects to FDA at 1-800-FDA-1088. Where should I keep my medicine? This medicine is only given in a clinic, doctor's office, or other health care setting and will not be stored at home. NOTE: This sheet is a summary. It may not cover all possible information. If you have questions about this medicine, talk to your doctor, pharmacist, or health care provider.  2020 Elsevier/Gold Standard (2017-12-12 16:10:44)

## 2020-06-27 ENCOUNTER — Other Ambulatory Visit: Payer: Self-pay | Admitting: Hematology and Oncology

## 2020-06-27 DIAGNOSIS — C61 Malignant neoplasm of prostate: Secondary | ICD-10-CM

## 2020-07-05 NOTE — Addendum Note (Signed)
Addended by: Juanetta Beets on: 07/05/2020 04:00 PM   Modules accepted: Orders

## 2020-07-07 ENCOUNTER — Other Ambulatory Visit: Payer: Self-pay

## 2020-07-07 ENCOUNTER — Inpatient Hospital Stay: Payer: Medicare Other | Attending: Oncology | Admitting: Hematology and Oncology

## 2020-07-07 ENCOUNTER — Other Ambulatory Visit: Payer: Medicare Other

## 2020-07-07 DIAGNOSIS — C7931 Secondary malignant neoplasm of brain: Secondary | ICD-10-CM | POA: Diagnosis present

## 2020-07-07 DIAGNOSIS — C61 Malignant neoplasm of prostate: Secondary | ICD-10-CM | POA: Diagnosis present

## 2020-07-07 LAB — CMP (CANCER CENTER ONLY)
ALT: 10 U/L (ref 0–44)
AST: 16 U/L (ref 15–41)
Albumin: 3.7 g/dL (ref 3.5–5.0)
Alkaline Phosphatase: 65 U/L (ref 38–126)
Anion gap: 9 (ref 5–15)
BUN: 24 mg/dL — ABNORMAL HIGH (ref 8–23)
CO2: 24 mmol/L (ref 22–32)
Calcium: 8.9 mg/dL (ref 8.9–10.3)
Chloride: 106 mmol/L (ref 98–111)
Creatinine: 1.51 mg/dL — ABNORMAL HIGH (ref 0.61–1.24)
GFR, Estimated: 47 mL/min — ABNORMAL LOW (ref >60)
Glucose, Bld: 107 mg/dL — ABNORMAL HIGH (ref 70–99)
Potassium: 4.6 mmol/L (ref 3.5–5.1)
Sodium: 139 mmol/L (ref 135–145)
Total Bilirubin: 0.8 mg/dL (ref 0.3–1.2)
Total Protein: 6 g/dL — ABNORMAL LOW (ref 6.5–8.1)

## 2020-07-07 LAB — CBC WITH DIFFERENTIAL (CANCER CENTER ONLY)
Abs Immature Granulocytes: 0.02 10*3/uL (ref 0.00–0.07)
Basophils Absolute: 0 10*3/uL (ref 0.0–0.1)
Basophils Relative: 1 %
Eosinophils Absolute: 0.2 10*3/uL (ref 0.0–0.5)
Eosinophils Relative: 2 %
HCT: 41.5 % (ref 39.0–52.0)
Hemoglobin: 13.5 g/dL (ref 13.0–17.0)
Immature Granulocytes: 0 %
Lymphocytes Relative: 33 %
Lymphs Abs: 2 10*3/uL (ref 0.7–4.0)
MCH: 30.8 pg (ref 26.0–34.0)
MCHC: 32.5 g/dL (ref 30.0–36.0)
MCV: 94.5 fL (ref 80.0–100.0)
Monocytes Absolute: 0.6 10*3/uL (ref 0.1–1.0)
Monocytes Relative: 10 %
Neutro Abs: 3.3 10*3/uL (ref 1.7–7.7)
Neutrophils Relative %: 54 %
Platelet Count: 253 10*3/uL (ref 150–400)
RBC: 4.39 MIL/uL (ref 4.22–5.81)
RDW: 15.5 % (ref 11.5–15.5)
WBC Count: 6.2 10*3/uL (ref 4.0–10.5)
nRBC: 0 % (ref 0.0–0.2)

## 2020-07-07 LAB — LIPID PANEL
Cholesterol: 193 (ref 0–200)
HDL: 52 (ref 35–70)
LDL Cholesterol: 89
LDl/HDL Ratio: 3.7
Triglycerides: 261 — AB (ref 40–160)

## 2020-07-07 LAB — PSA: Prostatic Specific Antigen: <0.01 ng/mL (ref 0.00–4.00)

## 2020-07-10 ENCOUNTER — Inpatient Hospital Stay: Payer: Medicare Other

## 2020-07-10 ENCOUNTER — Other Ambulatory Visit: Payer: Self-pay

## 2020-07-10 VITALS — BP 132/89 | HR 67 | Temp 98.1°F | Resp 18 | Ht 69.0 in | Wt 186.5 lb

## 2020-07-10 DIAGNOSIS — C7951 Secondary malignant neoplasm of bone: Secondary | ICD-10-CM

## 2020-07-10 DIAGNOSIS — C7952 Secondary malignant neoplasm of bone marrow: Secondary | ICD-10-CM

## 2020-07-10 DIAGNOSIS — C61 Malignant neoplasm of prostate: Secondary | ICD-10-CM

## 2020-07-10 DIAGNOSIS — C7931 Secondary malignant neoplasm of brain: Secondary | ICD-10-CM | POA: Diagnosis not present

## 2020-07-10 MED ORDER — DENOSUMAB 120 MG/1.7ML ~~LOC~~ SOLN
SUBCUTANEOUS | Status: AC
  Filled 2020-07-10: qty 1.7

## 2020-07-10 MED ORDER — DENOSUMAB 120 MG/1.7ML ~~LOC~~ SOLN
120.0000 mg | Freq: Once | SUBCUTANEOUS | Status: AC
  Administered 2020-07-10: 120 mg via SUBCUTANEOUS

## 2020-07-10 NOTE — Patient Instructions (Signed)
Denosumab injection °What is this medicine? °DENOSUMAB (den oh sue mab) slows bone breakdown. Prolia is used to treat osteoporosis in women after menopause and in men, and in people who are taking corticosteroids for 6 months or more. Xgeva is used to treat a high calcium level due to cancer and to prevent bone fractures and other bone problems caused by multiple myeloma or cancer bone metastases. Xgeva is also used to treat giant cell tumor of the bone. °This medicine may be used for other purposes; ask your health care provider or pharmacist if you have questions. °COMMON BRAND NAME(S): Prolia, XGEVA °What should I tell my health care provider before I take this medicine? °They need to know if you have any of these conditions: °· dental disease °· having surgery or tooth extraction °· infection °· kidney disease °· low levels of calcium or Vitamin D in the blood °· malnutrition °· on hemodialysis °· skin conditions or sensitivity °· thyroid or parathyroid disease °· an unusual reaction to denosumab, other medicines, foods, dyes, or preservatives °· pregnant or trying to get pregnant °· breast-feeding °How should I use this medicine? °This medicine is for injection under the skin. It is given by a health care professional in a hospital or clinic setting. °A special MedGuide will be given to you before each treatment. Be sure to read this information carefully each time. °For Prolia, talk to your pediatrician regarding the use of this medicine in children. Special care may be needed. For Xgeva, talk to your pediatrician regarding the use of this medicine in children. While this drug may be prescribed for children as young as 13 years for selected conditions, precautions do apply. °Overdosage: If you think you have taken too much of this medicine contact a poison control center or emergency room at once. °NOTE: This medicine is only for you. Do not share this medicine with others. °What if I miss a dose? °It is  important not to miss your dose. Call your doctor or health care professional if you are unable to keep an appointment. °What may interact with this medicine? °Do not take this medicine with any of the following medications: °· other medicines containing denosumab °This medicine may also interact with the following medications: °· medicines that lower your chance of fighting infection °· steroid medicines like prednisone or cortisone °This list may not describe all possible interactions. Give your health care provider a list of all the medicines, herbs, non-prescription drugs, or dietary supplements you use. Also tell them if you smoke, drink alcohol, or use illegal drugs. Some items may interact with your medicine. °What should I watch for while using this medicine? °Visit your doctor or health care professional for regular checks on your progress. Your doctor or health care professional may order blood tests and other tests to see how you are doing. °Call your doctor or health care professional for advice if you get a fever, chills or sore throat, or other symptoms of a cold or flu. Do not treat yourself. This drug may decrease your body's ability to fight infection. Try to avoid being around people who are sick. °You should make sure you get enough calcium and vitamin D while you are taking this medicine, unless your doctor tells you not to. Discuss the foods you eat and the vitamins you take with your health care professional. °See your dentist regularly. Brush and floss your teeth as directed. Before you have any dental work done, tell your dentist you are   receiving this medicine. Do not become pregnant while taking this medicine or for 5 months after stopping it. Talk with your doctor or health care professional about your birth control options while taking this medicine. Women should inform their doctor if they wish to become pregnant or think they might be pregnant. There is a potential for serious side  effects to an unborn child. Talk to your health care professional or pharmacist for more information. What side effects may I notice from receiving this medicine? Side effects that you should report to your doctor or health care professional as soon as possible:  allergic reactions like skin rash, itching or hives, swelling of the face, lips, or tongue  bone pain  breathing problems  dizziness  jaw pain, especially after dental work  redness, blistering, peeling of the skin  signs and symptoms of infection like fever or chills; cough; sore throat; pain or trouble passing urine  signs of low calcium like fast heartbeat, muscle cramps or muscle pain; pain, tingling, numbness in the hands or feet; seizures  unusual bleeding or bruising  unusually weak or tired Side effects that usually do not require medical attention (report to your doctor or health care professional if they continue or are bothersome):  constipation  diarrhea  headache  joint pain  loss of appetite  muscle pain  runny nose  tiredness  upset stomach This list may not describe all possible side effects. Call your doctor for medical advice about side effects. You may report side effects to FDA at 1-800-FDA-1088. Where should I keep my medicine? This medicine is only given in a clinic, doctor's office, or other health care setting and will not be stored at home. NOTE: This sheet is a summary. It may not cover all possible information. If you have questions about this medicine, talk to your doctor, pharmacist, or health care provider.  2020 Elsevier/Gold Standard (2017-12-12 16:10:44)

## 2020-07-10 NOTE — Progress Notes (Signed)
Pt stable at time of discharge. 

## 2020-07-12 ENCOUNTER — Encounter: Payer: Self-pay | Admitting: Internal Medicine

## 2020-07-25 ENCOUNTER — Other Ambulatory Visit: Payer: Self-pay | Admitting: Hematology and Oncology

## 2020-07-25 DIAGNOSIS — C61 Malignant neoplasm of prostate: Secondary | ICD-10-CM

## 2020-07-25 DIAGNOSIS — C7951 Secondary malignant neoplasm of bone: Secondary | ICD-10-CM

## 2020-07-28 ENCOUNTER — Other Ambulatory Visit: Payer: Self-pay

## 2020-07-28 ENCOUNTER — Encounter: Payer: Self-pay | Admitting: Internal Medicine

## 2020-07-28 ENCOUNTER — Ambulatory Visit: Payer: Medicare Other | Admitting: Internal Medicine

## 2020-07-28 ENCOUNTER — Ambulatory Visit (INDEPENDENT_AMBULATORY_CARE_PROVIDER_SITE_OTHER): Payer: Medicare Other | Admitting: Internal Medicine

## 2020-07-28 VITALS — BP 130/64 | HR 66 | Ht 69.0 in | Wt 186.2 lb

## 2020-07-28 DIAGNOSIS — I10 Essential (primary) hypertension: Secondary | ICD-10-CM

## 2020-07-28 DIAGNOSIS — E7849 Other hyperlipidemia: Secondary | ICD-10-CM | POA: Diagnosis not present

## 2020-07-28 DIAGNOSIS — M791 Myalgia, unspecified site: Secondary | ICD-10-CM

## 2020-07-28 DIAGNOSIS — I252 Old myocardial infarction: Secondary | ICD-10-CM | POA: Diagnosis not present

## 2020-07-28 DIAGNOSIS — I255 Ischemic cardiomyopathy: Secondary | ICD-10-CM

## 2020-07-28 DIAGNOSIS — T466X5A Adverse effect of antihyperlipidemic and antiarteriosclerotic drugs, initial encounter: Secondary | ICD-10-CM

## 2020-07-28 NOTE — Patient Instructions (Signed)
Medication Instructions:  Your physician recommends that you continue on your current medications as directed. Please refer to the Current Medication list given to you today.  *If you need a refill on your cardiac medications before your next appointment, please call your pharmacy*   Follow-Up: At [facility], you and your health needs are our priority.  As part of our continuing mission to provide you with exceptional heart care, we have created designated Provider Care Teams.  These Care Teams include your primary Cardiologist (physician) and Advanced Practice Providers (APPs -  Physician Assistants and Nurse Practitioners) who all work together to provide you with the care you need, when you need it.  We recommend signing up for the patient portal called "MyChart".  Sign up information is provided on this After Visit Summary.  MyChart is used to connect with patients for Virtual Visits (Telemedicine).  Patients are able to view lab/test results, encounter notes, upcoming appointments, etc.  Non-urgent messages can be sent to your provider as well.   To learn more about what you can do with MyChart, go to [email or web address].    Your next appointment:   12 month(s)  The format for your next appointment:   In Person  Provider:   Raliegh Ip Mali Hilty, MD   Other Instructions Work on [place] paperwork.

## 2020-07-28 NOTE — Progress Notes (Signed)
LIPID CLINIC CONSULT NOTE  Chief Complaint:  Follow-up dyslipidemia  Primary Care Physician: Serita Grammes, MD  Primary Cardiologist:  Pixie Casino, MD  HPI:  Tommy Ward is a 79 y.o. male who is being seen today for the evaluation of dyslipidemia at the request of Serita Grammes, MD.  This is a Tommy 79 year old male patient of Dr. 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  Tommy Ward has a [place] heritage however he says most likely inherited is coronary artery disease and dyslipidemia from his mom and sister, both had high cholesterol though his mother died at age 45.  He was originally from [place] and was living in [place] prior to coming to [place].  He saw a cardiologist there named Dr. Erling Cruz in [place].  At one point he had stress testing which indicated a fixed defect suggestive of scar.  His LVEF was reduced between 40 to 45%.  Subsequently here he is also had repeat stress testing and echo which are confirmed evidence of prior heart attack.  This being said, he has never had a prior heart catheterization.  EKG today shows sinus rhythm with marked sinus arrhythmia and left bundle branch block, performed due to his coronary history which was not well elucidated in the chart.  He was also noted to be irregularly irregular on exam.  Most recently his lipid showed total cholesterol 400, triglycerides 295, HDL 51 and LDL 290.  In addition to his marked dyslipidemia, he reports significant statin intolerance to multiple different statins.  12/30/2019  Tommy Ward returns today for follow-up.  Overall he is doing well on Repatha.  He has had marked reduction in his lipids.  Most recently his lipids showed total cholesterol 255, triglycerides 398, HDL 44 and LDL 131.  This is down from 290.  Total cholesterol was over 400.  He denies any side effects with the medication.  I would like to see if we could drive his  cholesterol a little lower.  We talked about different options including possibly adding ezetimibe or Vascepa.  Cost may be an issue as the PCSK9 inhibitor is expensive.  07/28/2020  Tommy Ward is seen today in follow-up.  Unfortunately he was unable to tolerate ezetimibe which caused similar side effects as the statins.  Despite this though he has made some dietary changes and is more active.  His lipids are actually even better now with total of 193, triglycerides still elevated 261, HDL 52 and LDL 89.  This is on monotherapy with Repatha.  PMHx:  Past Medical History:  Diagnosis Date  . Cancer ([place]) 08/2008   prostate ca  . Cataract 11/2013   bilateral  . GERD (gastroesophageal reflux disease)   . Hyperlipidemia   . Hypertension   . Malignant neoplasm of prostate ([place])   . S/P radiation therapy > 12 wks ago 2010   prostate CA  . Secondary malignant neoplasm of bone and bone marrow [facility])     Past Surgical History:  Procedure Laterality Date  . CHOLECYSTECTOMY  01/2003   lap chole  . left hip repaired      FAMHx:  Family History  Problem Relation Age of Onset  . Leukemia Mother   . Prostate cancer Father   . Heart attack Brother   . Colon cancer Neg Hx   . Rectal cancer Neg Hx   . Stomach cancer Neg Hx   . Esophageal cancer Neg Hx  SOCHx:   reports that he has been smoking. He has a 37.50 pack-year smoking history. He has never used smokeless tobacco. He reports previous alcohol use. He reports that he does not use drugs.  ALLERGIES:  Allergies  Allergen Reactions  . Ezetimibe Other (See Comments)    Myalgia  . Statins     Other reaction(s): Myalgias (intolerance)    ROS: Pertinent items noted in HPI and remainder of comprehensive ROS otherwise negative.  HOME MEDS: Current Outpatient Medications on File Prior to Visit  Medication Sig Dispense Refill  . aspirin EC 81 MG tablet Take 1 tablet (81 mg total) by mouth daily. 90 tablet 3  . b complex vitamins  capsule Take 1 capsule by mouth daily.    . calcium-vitamin D (OSCAL WITH D) 500-200 MG-UNIT tablet Take 1 tablet by mouth. 1200 mg every day.    . cholecalciferol (VITAMIN D3) 25 MCG (1000 UT) tablet Take 1,000 Units by mouth daily.    . enzalutamide (XTANDI) 40 MG tablet Take 160 mg by mouth daily.    . Evolocumab (REPATHA SURECLICK) 427 MG/ML SOAJ Inject 1 Dose into the skin every 14 (fourteen) days. 2 pen 11  . meclizine (ANTIVERT) 25 MG tablet Take 1 tablet by mouth daily.    . metoprolol succinate (TOPROL-XL) 50 MG 24 hr tablet Take 1 tablet by mouth daily.    . Multiple Vitamins-Minerals (PRESERVISION AREDS PO) Take by mouth.    . Omega-3 Fatty Acids (FISH OIL) 1000 MG CPDR Take by mouth.    Marland Kitchen omeprazole (PRILOSEC) 40 MG capsule Take 1 capsule by mouth daily.    . pregabalin (LYRICA) 75 MG capsule Take 1 capsule by mouth daily.    Marland Kitchen PREVALITE 4 g packet 4 g.    . tamsulosin (FLOMAX) 0.4 MG CAPS capsule Take 1 capsule by mouth daily.     No current facility-administered medications on file prior to visit.    LABS/IMAGING: No results found for this or any previous visit (from the past 48 hour(s)). No results found.  LIPID PANEL:    Component Value Date/Time   CHOL 193 07/07/2020 0000   CHOL 400 (H) 02/25/2019 0905   TRIG 261 (A) 07/07/2020 0000   HDL 52 07/07/2020 0000   HDL 51 02/25/2019 0905   CHOLHDL 7.8 (H) 02/25/2019 0905   LDLCALC 89 07/07/2020 0000   LDLCALC 290 (H) 02/25/2019 0905    WEIGHTS: Wt Readings from Last 3 Encounters:  07/28/20 186 lb 3.2 oz (84.5 kg)  07/10/20 186 lb 8 oz (84.6 kg)  06/09/20 183 lb 11.2 oz (83.3 kg)    VITALS: BP 130/64   Pulse 66   Ht 5\' 9"  (1.753 m)   Wt 186 lb 3.2 oz (84.5 kg)   BMI 27.50 kg/m   EXAM: Deferred  EKG: Deferred  ASSESSMENT: 1. Coronary artery disease with remote infarct 2. Ischemic cardiomyopathy 3. Probable FH, LDL 290 4. Statin intolerance-myalgias  PLAN: 1.   Tommy Ward has had further improvement  in LDL cholesterol now down to 89 which is a significant reduction from 290 in the past.  This is on Repatha in association with dietary changes.  Unfortunately could not tolerate ezetimibe or the statins.  LDL is close to target, actually well beyond a 50% reduction which is the recommended guideline.  One could also consider Vascepa given persistently elevated triglycerides, however cost may be an issue.  For now we will continue his current treatments.  Plan follow-up annually or  sooner as necessary.  Pixie Casino, MD, [facility], [place] Director of the Advanced Lipid Disorders &  Cardiovascular Risk Reduction Clinic Diplomate of the American Board of Clinical Lipidology Attending Cardiologist  Direct Dial: [phone]  Fax: [phone]  Website:  [email or web address]  Nadean Corwin Tommy Ward 07/28/2020, 1:20 PM

## 2020-08-01 NOTE — Progress Notes (Signed)
PT STABLE AT TIME OF DISCHARGE 

## 2020-08-03 ENCOUNTER — Other Ambulatory Visit: Payer: Self-pay | Admitting: Pharmacist

## 2020-08-07 ENCOUNTER — Other Ambulatory Visit: Payer: Self-pay | Admitting: Internal Medicine

## 2020-08-08 ENCOUNTER — Other Ambulatory Visit: Payer: Self-pay

## 2020-08-08 ENCOUNTER — Inpatient Hospital Stay: Payer: Medicare Other | Attending: Oncology

## 2020-08-08 DIAGNOSIS — C7952 Secondary malignant neoplasm of bone marrow: Secondary | ICD-10-CM

## 2020-08-08 DIAGNOSIS — C61 Malignant neoplasm of prostate: Secondary | ICD-10-CM | POA: Diagnosis present

## 2020-08-08 DIAGNOSIS — C7951 Secondary malignant neoplasm of bone: Secondary | ICD-10-CM | POA: Diagnosis present

## 2020-08-08 LAB — COMPREHENSIVE METABOLIC PANEL
Albumin: 3.9 (ref 3.5–5.0)
Calcium: 9 (ref 8.7–10.7)

## 2020-08-08 LAB — HEPATIC FUNCTION PANEL
ALT: 13 (ref 10–40)
AST: 32 (ref 14–40)
Alkaline Phosphatase: 68 (ref 25–125)
Bilirubin, Total: 0.8

## 2020-08-08 LAB — CBC AND DIFFERENTIAL
HCT: 39 — AB (ref 41–53)
Hemoglobin: 13.2 — AB (ref 13.5–17.5)
Neutrophils Absolute: 2.74
Platelets: 261 (ref 150–399)
WBC: 5.7

## 2020-08-08 LAB — BASIC METABOLIC PANEL
BUN: 34 — AB (ref 4–21)
CO2: 25 — AB (ref 13–22)
Chloride: 104 (ref 99–108)
Creatinine: 1.7 — AB (ref 0.6–1.3)
Glucose: 114
Potassium: 4 (ref 3.4–5.3)
Sodium: 140 (ref 137–147)

## 2020-08-08 LAB — CBC: RBC: 4.37 (ref 3.87–5.11)

## 2020-08-09 ENCOUNTER — Other Ambulatory Visit: Payer: Self-pay | Admitting: Hematology and Oncology

## 2020-08-09 LAB — CBC: MCV: 89 (ref 76–111)

## 2020-08-10 ENCOUNTER — Other Ambulatory Visit: Payer: Self-pay

## 2020-08-10 ENCOUNTER — Inpatient Hospital Stay: Payer: Medicare Other

## 2020-08-10 VITALS — BP 111/56 | HR 59 | Temp 98.2°F | Resp 18 | Ht 69.0 in | Wt 186.2 lb

## 2020-08-10 DIAGNOSIS — C7951 Secondary malignant neoplasm of bone: Secondary | ICD-10-CM | POA: Diagnosis not present

## 2020-08-10 DIAGNOSIS — C7952 Secondary malignant neoplasm of bone marrow: Secondary | ICD-10-CM

## 2020-08-10 DIAGNOSIS — C61 Malignant neoplasm of prostate: Secondary | ICD-10-CM

## 2020-08-10 LAB — PROSTATE-SPECIFIC AG, SERUM (LABCORP): Prostate Specific Ag, Serum: <0.1 ng/mL (ref 0.0–4.0)

## 2020-08-10 MED ORDER — DENOSUMAB 120 MG/1.7ML ~~LOC~~ SOLN
120.0000 mg | Freq: Once | SUBCUTANEOUS | Status: AC
  Administered 2020-08-10: 120 mg via SUBCUTANEOUS

## 2020-08-10 MED ORDER — DENOSUMAB 120 MG/1.7ML ~~LOC~~ SOLN
SUBCUTANEOUS | Status: AC
  Filled 2020-08-10: qty 1.7

## 2020-08-10 NOTE — Progress Notes (Signed)
PT STABLE AT TIME OF DISCHARGE 

## 2020-08-10 NOTE — Patient Instructions (Signed)
Denosumab injection What is this medicine? DENOSUMAB (den oh sue mab) slows bone breakdown. Prolia is used to treat osteoporosis in women after menopause and in men, and in people who are taking corticosteroids for 6 months or more. Xgeva is used to treat a high calcium level due to cancer and to prevent bone fractures and other bone problems caused by multiple myeloma or cancer bone metastases. Xgeva is also used to treat giant cell tumor of the bone. This medicine may be used for other purposes; ask your health care provider or pharmacist if you have questions. COMMON BRAND NAME(S): Prolia, XGEVA What should I tell my health care provider before I take this medicine? They need to know if you have any of these conditions:  dental disease  having surgery or tooth extraction  infection  kidney disease  low levels of calcium or Vitamin D in the blood  malnutrition  on hemodialysis  skin conditions or sensitivity  thyroid or parathyroid disease  an unusual reaction to denosumab, other medicines, foods, dyes, or preservatives  pregnant or trying to get pregnant  breast-feeding How should I use this medicine? This medicine is for injection under the skin. It is given by a health care professional in a hospital or clinic setting. A special MedGuide will be given to you before each treatment. Be sure to read this information carefully each time. For Prolia, talk to your pediatrician regarding the use of this medicine in children. Special care may be needed. For Xgeva, talk to your pediatrician regarding the use of this medicine in children. While this drug may be prescribed for children as young as 13 years for selected conditions, precautions do apply. Overdosage: If you think you have taken too much of this medicine contact a poison control center or emergency room at once. NOTE: This medicine is only for you. Do not share this medicine with others. What if I miss a dose? It is  important not to miss your dose. Call your doctor or health care professional if you are unable to keep an appointment. What may interact with this medicine? Do not take this medicine with any of the following medications:  other medicines containing denosumab This medicine may also interact with the following medications:  medicines that lower your chance of fighting infection  steroid medicines like prednisone or cortisone This list may not describe all possible interactions. Give your health care provider a list of all the medicines, herbs, non-prescription drugs, or dietary supplements you use. Also tell them if you smoke, drink alcohol, or use illegal drugs. Some items may interact with your medicine. What should I watch for while using this medicine? Visit your doctor or health care professional for regular checks on your progress. Your doctor or health care professional may order blood tests and other tests to see how you are doing. Call your doctor or health care professional for advice if you get a fever, chills or sore throat, or other symptoms of a cold or flu. Do not treat yourself. This drug may decrease your body's ability to fight infection. Try to avoid being around people who are sick. You should make sure you get enough calcium and vitamin D while you are taking this medicine, unless your doctor tells you not to. Discuss the foods you eat and the vitamins you take with your health care professional. See your dentist regularly. Brush and floss your teeth as directed. Before you have any dental work done, tell your dentist you are   receiving this medicine. Do not become pregnant while taking this medicine or for 5 months after stopping it. Talk with your doctor or health care professional about your birth control options while taking this medicine. Women should inform their doctor if they wish to become pregnant or think they might be pregnant. There is a potential for serious side  effects to an unborn child. Talk to your health care professional or pharmacist for more information. What side effects may I notice from receiving this medicine? Side effects that you should report to your doctor or health care professional as soon as possible:  allergic reactions like skin rash, itching or hives, swelling of the face, lips, or tongue  bone pain  breathing problems  dizziness  jaw pain, especially after dental work  redness, blistering, peeling of the skin  signs and symptoms of infection like fever or chills; cough; sore throat; pain or trouble passing urine  signs of low calcium like fast heartbeat, muscle cramps or muscle pain; pain, tingling, numbness in the hands or feet; seizures  unusual bleeding or bruising  unusually weak or tired Side effects that usually do not require medical attention (report to your doctor or health care professional if they continue or are bothersome):  constipation  diarrhea  headache  joint pain  loss of appetite  muscle pain  runny nose  tiredness  upset stomach This list may not describe all possible side effects. Call your doctor for medical advice about side effects. You may report side effects to FDA at 1-800-FDA-1088. Where should I keep my medicine? This medicine is only given in a clinic, doctor's office, or other health care setting and will not be stored at home. NOTE: This sheet is a summary. It may not cover all possible information. If you have questions about this medicine, talk to your doctor, pharmacist, or health care provider.  2020 Elsevier/Gold Standard (2017-12-12 16:10:44)

## 2020-08-24 ENCOUNTER — Telehealth: Payer: Self-pay | Admitting: Internal Medicine

## 2020-08-24 NOTE — Telephone Encounter (Signed)
*  STAT* If patient is at the pharmacy, call can be transferred to refill team.   1. Which medications need to be refilled? (please list name of each medication and dose if known) REPATHA SURECLICK 276 MG/ML SOAJ  2. Which pharmacy/location (including street and city if local pharmacy) is medication to be sent to? [facility] [place], [place]  3. Do they need a 30 day or 90 day supply? 90 day supply

## 2020-08-28 ENCOUNTER — Telehealth: Payer: Self-pay | Admitting: Internal Medicine

## 2020-08-28 NOTE — Telephone Encounter (Signed)
PA for repatha submitted via CMM (Key: [id number])

## 2020-08-29 MED ORDER — REPATHA SURECLICK 140 MG/ML ~~LOC~~ SOAJ: SUBCUTANEOUS | 3 refills | Status: DC

## 2020-08-29 NOTE — Telephone Encounter (Signed)
Medication approved: Effective from 08/28/2020 through 08/28/2021

## 2020-09-05 NOTE — Addendum Note (Signed)
Addended by: Juanetta Beets on: 09/05/2020 04:42 PM   Modules accepted: Orders

## 2020-09-07 NOTE — Progress Notes (Signed)
[facility]  [address] [place],  [place]  [address] [phone]  Clinic Day:  09/08/2020  Referring physician: Serita Grammes, MD   CHIEF COMPLAINT:  CC: A 80 year old male with metastatic prostate cancer  Current Treatment:  Trelstar/ enzalutamide/ Delton See   HISTORY OF PRESENT ILLNESS:  Parsa Rickett is a 80 y.o. male with metastatic prostate cancer, who is currently taking Trelstar/enzalutamide for his complete androgen blockade therapy.  He also is taking Xgeva to protect his bones against worsening disease metastasis.  He comes in today for routine followup.  Since his last visit, the patient has been doing very well.  He denies having any significant bone pain or other systemic symptoms which concern him for overt progression of his metastatic prostate cancer. He denies fever, chills, nausea or vomiting. He denies shortness of breath, cough or chest pain. He denies issue with bowel or bladder. CBC is unremarkable today. CMP reveals creatinine 1.7 and BUN 35.  REVIEW OF SYSTEMS:  Review of Systems  Constitutional: Negative for appetite change, chills, diaphoresis, fatigue, fever and unexpected weight change.  HENT:   Negative for hearing loss, lump/mass, mouth sores, nosebleeds, sore throat, tinnitus, trouble swallowing and voice change.   Eyes: Negative for eye problems and icterus.  Respiratory: Negative for chest tightness, cough, hemoptysis, shortness of breath and wheezing.   Cardiovascular: Negative for chest pain, leg swelling and palpitations.  Gastrointestinal: Negative for abdominal distention, abdominal pain, blood in stool, constipation, diarrhea, nausea, rectal pain and vomiting.  Endocrine: Negative for hot flashes.  Genitourinary: Negative for bladder incontinence, difficulty urinating, dyspareunia, dysuria, frequency, hematuria and nocturia.   Musculoskeletal: Negative for arthralgias, back pain, flank pain, gait problem,  myalgias, neck pain and neck stiffness.  Skin: Negative for itching, rash and wound.  Neurological: Negative for dizziness, extremity weakness, gait problem, headaches, light-headedness, numbness, seizures and speech difficulty.  Hematological: Negative for adenopathy. Does not bruise/bleed easily.  Psychiatric/Behavioral: Negative for confusion, decreased concentration, depression, sleep disturbance and suicidal ideas. The patient is not nervous/anxious.      VITALS:  Blood pressure 127/61, pulse 66, temperature 98.2 F (36.8 C), temperature source Oral, resp. rate 18, height 5\' 9"  (1.753 m), weight 189 lb 4.8 oz (85.9 kg), SpO2 96 %.  Wt Readings from Last 3 Encounters:  09/08/20 189 lb 4.8 oz (85.9 kg)  08/10/20 186 lb 4 oz (84.5 kg)  06/09/20 183 lb 7 oz (83.2 kg)    Body mass index is 27.95 kg/m.  Performance status (ECOG): 1 - Symptomatic but completely ambulatory  PHYSICAL EXAM:  Physical Exam Constitutional:      General: He is not in acute distress.    Appearance: Normal appearance. He is normal weight. He is not ill-appearing, toxic-appearing or diaphoretic.  HENT:     Head: Normocephalic and atraumatic.     Right Ear: Tympanic membrane normal.     Left Ear: Tympanic membrane normal.     Nose: Nose normal. No congestion or rhinorrhea.     Mouth/Throat:     Mouth: Mucous membranes are moist.     Pharynx: Oropharynx is clear. No oropharyngeal exudate or posterior oropharyngeal erythema.  Eyes:     General: No scleral icterus.       Right eye: No discharge.        Left eye: No discharge.     Extraocular Movements: Extraocular movements intact.     Conjunctiva/sclera: Conjunctivae normal.     Pupils: Pupils are equal,  round, and reactive to light.  Neck:     Vascular: No carotid bruit.  Cardiovascular:     Rate and Rhythm: Normal rate and regular rhythm.     Heart sounds: No murmur heard. No friction rub. No gallop.   Pulmonary:     Effort: Pulmonary effort is  normal. No respiratory distress.     Breath sounds: Normal breath sounds. No stridor. No wheezing, rhonchi or rales.  Chest:     Chest wall: No tenderness.  Abdominal:     General: Abdomen is flat. Bowel sounds are normal. There is no distension.     Palpations: There is no mass.     Tenderness: There is no abdominal tenderness. There is no right CVA tenderness, left CVA tenderness, guarding or rebound.     Hernia: No hernia is present.  Musculoskeletal:        General: No swelling, tenderness, deformity or signs of injury. Normal range of motion.     Cervical back: Normal range of motion and neck supple. No rigidity or tenderness.     Right lower leg: No edema.     Left lower leg: No edema.  Lymphadenopathy:     Cervical: No cervical adenopathy.  Skin:    General: Skin is warm and dry.     Capillary Refill: Capillary refill takes less than 2 seconds.     Coloration: Skin is not jaundiced or pale.     Findings: No bruising, erythema, lesion or rash.  Neurological:     General: No focal deficit present.     Mental Status: He is alert and oriented to person, place, and time. Mental status is at baseline.     Cranial Nerves: No cranial nerve deficit.     Sensory: No sensory deficit.     Motor: No weakness.     Coordination: Coordination normal.     Gait: Gait normal.     Deep Tendon Reflexes: Reflexes normal.  Psychiatric:        Mood and Affect: Mood normal.        Behavior: Behavior normal.        Thought Content: Thought content normal.        Judgment: Judgment normal.    Lymph nodes:   There is no cervical, clavicular, axillary or inguinal lymphadenopathy.   LABS:   CBC Latest Ref Rng & Units 09/08/2020 08/08/2020 07/07/2020  WBC - 6.0 5.7 6.2  Hemoglobin 13.5 - 17.5 13.8 13.2(A) 13.5  Hematocrit 41 - 53 41 39(A) 41.5  Platelets 150 - 399 283 261 253   CMP Latest Ref Rng & Units 09/08/2020 08/08/2020 07/07/2020  Glucose 70 - 99 mg/dL - - 107(H)  BUN 4 - 21 35(A)  34(A) 24(H)  Creatinine 0.6 - 1.3 1.7(A) 1.7(A) 1.51(H)  Sodium 137 - 147 138 140 139  Potassium 3.4 - 5.3 4.3 4.0 4.6  Chloride 99 - 108 105 104 106  CO2 13 - 22 29(A) 25(A) 24  Calcium 8.7 - 10.7 9.6 9.0 8.9  Total Protein 6.5 - 8.1 g/dL - - 6.0(L)  Total Bilirubin 0.3 - 1.2 mg/dL - - 0.8  Alkaline Phos 25 - 125 75 68 65  AST 14 - 40 23 32 16  ALT 10 - 40 11 13 10      No results found for: CEA1 / No results found for: CEA1 Lab Results  Component Value Date   PSA1 <0.1 08/08/2020   No results found for: UUV253 No results found  for: JKD326  No results found for: TOTALPROTELP, ALBUMINELP, A1GS, A2GS, BETS, BETA2SER, GAMS, MSPIKE, SPEI No results found for: TIBC, FERRITIN, IRONPCTSAT No results found for: LDH  STUDIES:  No results found.    HISTORY:   Past Medical History:  Diagnosis Date  . Cancer ([facility]) 08/2008   prostate ca  . Cataract 11/2013   bilateral  . GERD (gastroesophageal reflux disease)   . Hyperlipidemia   . Hypertension   . Malignant neoplasm of prostate ([facility])   . Malignant neoplasm of prostate ([facility])   . S/P radiation therapy > 12 wks ago 2010   prostate CA  . Secondary malignant neoplasm of bone and bone marrow [facility])     Past Surgical History:  Procedure Laterality Date  . CHOLECYSTECTOMY  01/2003   lap chole  . left hip repaired      Family History  Problem Relation Age of Onset  . Leukemia Mother   . Prostate cancer Father   . Heart attack Brother   . Colon cancer Neg Hx   . Rectal cancer Neg Hx   . Stomach cancer Neg Hx   . Esophageal cancer Neg Hx     Social History:  reports that he has been smoking. He has a 37.50 pack-year smoking history. He has never used smokeless tobacco. He reports previous alcohol use. He reports that he does not use drugs.The patient is alone  today.  Allergies:  Allergies  Allergen Reactions  . Ezetimibe Other (See Comments)    Myalgia  . Statins     Other reaction(s): Myalgias (intolerance)     Current Medications: Current Outpatient Medications  Medication Sig Dispense Refill  . pregabalin (LYRICA) 100 MG capsule Take 1 capsule (100 mg total) by mouth 3 (three) times daily. 90 capsule 3  . aspirin EC 81 MG tablet Take 1 tablet (81 mg total) by mouth daily. 90 tablet 3  . b complex vitamins capsule Take 1 capsule by mouth daily.    . calcium-vitamin D (OSCAL WITH D) 500-200 MG-UNIT tablet Take 1 tablet by mouth. 1200 mg every day.    . cholecalciferol (VITAMIN D3) 25 MCG (1000 UT) tablet Take 1,000 Units by mouth daily.    . Denosumab (XGEVA Skyline-Ganipa) Inject into the skin.    . enzalutamide (XTANDI) 40 MG tablet Take 160 mg by mouth daily.    . Evolocumab (REPATHA SURECLICK) 712 MG/ML SOAJ INJECT 1 DOSE INTO THE SKIN EVERY 14 DAYS 6 mL 3  . lisinopril (ZESTRIL) 40 MG tablet Take 40 mg by mouth daily.    . meclizine (ANTIVERT) 25 MG tablet Take 1 tablet by mouth daily.    . metoprolol succinate (TOPROL-XL) 50 MG 24 hr tablet Take 1 tablet by mouth daily.    . Multiple Vitamins-Minerals (PRESERVISION AREDS PO) Take by mouth.    . Omega-3 Fatty Acids (FISH OIL) 1000 MG CPDR Take by mouth.    Marland Kitchen omeprazole (PRILOSEC) 40 MG capsule Take 1 capsule by mouth daily.    . pregabalin (LYRICA) 75 MG capsule Take 1 capsule by mouth daily.    Marland Kitchen PREVALITE 4 g packet 4 g.    . tamsulosin (FLOMAX) 0.4 MG CAPS capsule Take 1 capsule by mouth daily.     No current facility-administered medications for this visit.     ASSESSMENT & PLAN:   Assessment:  Jaja Switalski is a 80 y.o. male with metastatic prostate cancer.   Plan: He will continue taking this regimen until there  is evidence of disease progression.  He will continue taking Xgeva monthly for protection against his metastatic bone disease.  Overall, this gentleman continues to do very well.  I will see him back in 3 months for repeat clinical assessment.   The patient understands the plans discussed today and is in agreement with them.   He knows to contact our office if he develops concerns prior to his next appointment.   I provided 15 minutes of face-to-face time during this this encounter and > 50% was spent counseling as documented under my assessment and plan.    Melodye Ped, NP

## 2020-09-08 ENCOUNTER — Telehealth: Payer: Self-pay | Admitting: Oncology

## 2020-09-08 ENCOUNTER — Inpatient Hospital Stay: Payer: Medicare Other | Attending: Oncology

## 2020-09-08 ENCOUNTER — Other Ambulatory Visit: Payer: Self-pay

## 2020-09-08 ENCOUNTER — Inpatient Hospital Stay: Payer: Medicare Other | Attending: Oncology | Admitting: Hematology and Oncology

## 2020-09-08 VITALS — BP 127/61 | HR 66 | Temp 98.2°F | Resp 18 | Ht 69.0 in | Wt 189.3 lb

## 2020-09-08 DIAGNOSIS — C7951 Secondary malignant neoplasm of bone: Secondary | ICD-10-CM | POA: Insufficient documentation

## 2020-09-08 DIAGNOSIS — C61 Malignant neoplasm of prostate: Secondary | ICD-10-CM | POA: Insufficient documentation

## 2020-09-08 LAB — CBC: RBC: 4.5 (ref 3.87–5.11)

## 2020-09-08 LAB — CBC AND DIFFERENTIAL
HCT: 41 (ref 41–53)
Hemoglobin: 13.8 (ref 13.5–17.5)
Neutrophils Absolute: 2.94
Platelets: 283 (ref 150–399)
WBC: 6

## 2020-09-08 LAB — HEPATIC FUNCTION PANEL
ALT: 11 (ref 10–40)
AST: 23 (ref 14–40)
Alkaline Phosphatase: 75 (ref 25–125)
Bilirubin, Total: 0.6

## 2020-09-08 LAB — BASIC METABOLIC PANEL
BUN: 35 — AB (ref 4–21)
CO2: 29 — AB (ref 13–22)
Chloride: 105 (ref 99–108)
Creatinine: 1.7 — AB (ref 0.6–1.3)
Glucose: 107
Potassium: 4.3 (ref 3.4–5.3)
Sodium: 138 (ref 137–147)

## 2020-09-08 LAB — COMPREHENSIVE METABOLIC PANEL
Albumin: 4 (ref 3.5–5.0)
Calcium: 9.6 (ref 8.7–10.7)

## 2020-09-08 MED ORDER — PREGABALIN 100 MG PO CAPS: 100.0000 mg | ORAL_CAPSULE | Freq: Three times a day (TID) | ORAL | 3 refills | Status: DC

## 2020-09-08 NOTE — Telephone Encounter (Signed)
1/21 per los next appt given to patient

## 2020-09-11 ENCOUNTER — Other Ambulatory Visit: Payer: Self-pay | Admitting: Pharmacist

## 2020-09-11 ENCOUNTER — Other Ambulatory Visit: Payer: Self-pay

## 2020-09-11 ENCOUNTER — Inpatient Hospital Stay: Payer: Medicare Other

## 2020-09-11 VITALS — BP 125/58 | HR 62 | Resp 18 | Ht 69.0 in | Wt 182.8 lb

## 2020-09-11 DIAGNOSIS — C7951 Secondary malignant neoplasm of bone: Secondary | ICD-10-CM | POA: Diagnosis not present

## 2020-09-11 DIAGNOSIS — C61 Malignant neoplasm of prostate: Secondary | ICD-10-CM | POA: Diagnosis present

## 2020-09-11 MED ORDER — DENOSUMAB 120 MG/1.7ML ~~LOC~~ SOLN
120.0000 mg | Freq: Once | SUBCUTANEOUS | Status: AC
  Administered 2020-09-11: 120 mg via SUBCUTANEOUS

## 2020-09-11 MED ORDER — DENOSUMAB 120 MG/1.7ML ~~LOC~~ SOLN
SUBCUTANEOUS | Status: AC
  Filled 2020-09-11: qty 1.7

## 2020-09-11 NOTE — Patient Instructions (Signed)
Denosumab injection What is this medicine? DENOSUMAB (den oh sue mab) slows bone breakdown. Prolia is used to treat osteoporosis in women after menopause and in men, and in people who are taking corticosteroids for 6 months or more. Delton See is used to treat a high calcium level due to cancer and to prevent bone fractures and other bone problems caused by multiple myeloma or cancer bone metastases. Delton See is also used to treat giant cell tumor of the bone. This medicine may be used for other purposes; ask your health care provider or pharmacist if you have questions. COMMON BRAND NAME(S): Prolia, XGEVA What should I tell my health care provider before I take this medicine? They need to know if you have any of these conditions:  dental disease  having surgery or tooth extraction  infection  kidney disease  low levels of calcium or Vitamin D in the blood  malnutrition  on hemodialysis  skin conditions or sensitivity  thyroid or parathyroid disease  an unusual reaction to denosumab, other medicines, foods, dyes, or preservatives  pregnant or trying to get pregnant  breast-feeding How should I use this medicine? This medicine is for injection under the skin. It is given by a health care professional in a hospital or clinic setting. A special MedGuide will be given to you before each treatment. Be sure to read this information carefully each time. For Prolia, talk to your pediatrician regarding the use of this medicine in children. Special care may be needed. For Delton See, talk to your pediatrician regarding the use of this medicine in children. While this drug may be prescribed for children as young as 13 years for selected conditions, precautions do apply. Overdosage: If you think you have taken too much of this medicine contact a poison control center or emergency room at once. NOTE: This medicine is only for you. Do not share this medicine with others. What if I miss a dose? It is  important not to miss your dose. Call your doctor or health care professional if you are unable to keep an appointment. What may interact with this medicine? Do not take this medicine with any of the following medications:  other medicines containing denosumab This medicine may also interact with the following medications:  medicines that lower your chance of fighting infection  steroid medicines like prednisone or cortisone This list may not describe all possible interactions. Give your health care provider a list of all the medicines, herbs, non-prescription drugs, or dietary supplements you use. Also tell them if you smoke, drink alcohol, or use illegal drugs. Some items may interact with your medicine. What should I watch for while using this medicine? Visit your doctor or health care professional for regular checks on your progress. Your doctor or health care professional may order blood tests and other tests to see how you are doing. Call your doctor or health care professional for advice if you get a fever, chills or sore throat, or other symptoms of a cold or flu. Do not treat yourself. This drug may decrease your body's ability to fight infection. Try to avoid being around people who are sick. You should make sure you get enough calcium and vitamin D while you are taking this medicine, unless your doctor tells you not to. Discuss the foods you eat and the vitamins you take with your health care professional. See your dentist regularly. Brush and floss your teeth as directed. Before you have any dental work done, tell your dentist you are  receiving this medicine. Do not become pregnant while taking this medicine or for 5 months after stopping it. Talk with your doctor or health care professional about your birth control options while taking this medicine. Women should inform their doctor if they wish to become pregnant or think they might be pregnant. There is a potential for serious side  effects to an unborn child. Talk to your health care professional or pharmacist for more information. What side effects may I notice from receiving this medicine? Side effects that you should report to your doctor or health care professional as soon as possible:  allergic reactions like skin rash, itching or hives, swelling of the face, lips, or tongue  bone pain  breathing problems  dizziness  jaw pain, especially after dental work  redness, blistering, peeling of the skin  signs and symptoms of infection like fever or chills; cough; sore throat; pain or trouble passing urine  signs of low calcium like fast heartbeat, muscle cramps or muscle pain; pain, tingling, numbness in the hands or feet; seizures  unusual bleeding or bruising  unusually weak or tired Side effects that usually do not require medical attention (report to your doctor or health care professional if they continue or are bothersome):  constipation  diarrhea  headache  joint pain  loss of appetite  muscle pain  runny nose  tiredness  upset stomach This list may not describe all possible side effects. Call your doctor for medical advice about side effects. You may report side effects to FDA at 1-800-FDA-1088. Where should I keep my medicine? This medicine is only given in a clinic, doctor's office, or other health care setting and will not be stored at home. NOTE: This sheet is a summary. It may not cover all possible information. If you have questions about this medicine, talk to your doctor, pharmacist, or health care provider.  2021 Elsevier/Gold Standard (2017-12-12 16:10:44)

## 2020-10-06 ENCOUNTER — Inpatient Hospital Stay: Payer: Medicare Other | Attending: Oncology

## 2020-10-06 ENCOUNTER — Other Ambulatory Visit: Payer: Self-pay | Admitting: Hematology and Oncology

## 2020-10-06 DIAGNOSIS — C61 Malignant neoplasm of prostate: Secondary | ICD-10-CM | POA: Insufficient documentation

## 2020-10-06 DIAGNOSIS — C7951 Secondary malignant neoplasm of bone: Secondary | ICD-10-CM | POA: Insufficient documentation

## 2020-10-06 LAB — HEPATIC FUNCTION PANEL
ALT: 13 (ref 10–40)
AST: 19 (ref 14–40)
Alkaline Phosphatase: 74 (ref 25–125)
Bilirubin, Total: 0.9

## 2020-10-06 LAB — CBC AND DIFFERENTIAL
HCT: 38 — AB (ref 41–53)
Hemoglobin: 12.8 — AB (ref 13.5–17.5)
Neutrophils Absolute: 2.7
Platelets: 247 (ref 150–399)
WBC: 4.9

## 2020-10-06 LAB — BASIC METABOLIC PANEL
BUN: 35 — AB (ref 4–21)
CO2: 24 — AB (ref 13–22)
Chloride: 106 (ref 99–108)
Creatinine: 1.7 — AB (ref 0.6–1.3)
Glucose: 122
Potassium: 4 (ref 3.4–5.3)
Sodium: 139 (ref 137–147)

## 2020-10-06 LAB — COMPREHENSIVE METABOLIC PANEL
Albumin: 3.9 (ref 3.5–5.0)
Calcium: 9.3 (ref 8.7–10.7)

## 2020-10-06 LAB — CBC: RBC: 4.09 (ref 3.87–5.11)

## 2020-10-09 ENCOUNTER — Inpatient Hospital Stay: Payer: Medicare Other

## 2020-10-09 NOTE — Addendum Note (Signed)
Addended by: Juanetta Beets on: 10/09/2020 11:45 AM   Modules accepted: Orders

## 2020-10-10 ENCOUNTER — Other Ambulatory Visit: Payer: Self-pay

## 2020-10-10 ENCOUNTER — Inpatient Hospital Stay: Payer: Medicare Other

## 2020-10-10 VITALS — BP 124/59 | HR 72 | Temp 98.1°F | Resp 18 | Ht 69.0 in | Wt 186.0 lb

## 2020-10-10 DIAGNOSIS — C61 Malignant neoplasm of prostate: Secondary | ICD-10-CM | POA: Diagnosis present

## 2020-10-10 DIAGNOSIS — C7951 Secondary malignant neoplasm of bone: Secondary | ICD-10-CM | POA: Diagnosis not present

## 2020-10-10 MED ORDER — DENOSUMAB 120 MG/1.7ML ~~LOC~~ SOLN
SUBCUTANEOUS | Status: AC
  Filled 2020-10-10: qty 1.7

## 2020-10-10 MED ORDER — DENOSUMAB 120 MG/1.7ML ~~LOC~~ SOLN
120.0000 mg | Freq: Once | SUBCUTANEOUS | Status: AC
  Administered 2020-10-10: 120 mg via SUBCUTANEOUS

## 2020-10-10 NOTE — Patient Instructions (Signed)
Denosumab injection What is this medicine? DENOSUMAB (den oh sue mab) slows bone breakdown. Prolia is used to treat osteoporosis in women after menopause and in men, and in people who are taking corticosteroids for 6 months or more. Xgeva is used to treat a high calcium level due to cancer and to prevent bone fractures and other bone problems caused by multiple myeloma or cancer bone metastases. Xgeva is also used to treat giant cell tumor of the bone. This medicine may be used for other purposes; ask your health care provider or pharmacist if you have questions. COMMON BRAND NAME(S): Prolia, XGEVA What should I tell my health care provider before I take this medicine? They need to know if you have any of these conditions:  dental disease  having surgery or tooth extraction  infection  kidney disease  low levels of calcium or Vitamin D in the blood  malnutrition  on hemodialysis  skin conditions or sensitivity  thyroid or parathyroid disease  an unusual reaction to denosumab, other medicines, foods, dyes, or preservatives  pregnant or trying to get pregnant  breast-feeding How should I use this medicine? This medicine is for injection under the skin. It is given by a health care professional in a hospital or clinic setting. A special MedGuide will be given to you before each treatment. Be sure to read this information carefully each time. For Prolia, talk to your pediatrician regarding the use of this medicine in children. Special care may be needed. For Xgeva, talk to your pediatrician regarding the use of this medicine in children. While this drug may be prescribed for children as young as 13 years for selected conditions, precautions do apply. Overdosage: If you think you have taken too much of this medicine contact a poison control center or emergency room at once. NOTE: This medicine is only for you. Do not share this medicine with others. What if I miss a dose? It is  important not to miss your dose. Call your doctor or health care professional if you are unable to keep an appointment. What may interact with this medicine? Do not take this medicine with any of the following medications:  other medicines containing denosumab This medicine may also interact with the following medications:  medicines that lower your chance of fighting infection  steroid medicines like prednisone or cortisone This list may not describe all possible interactions. Give your health care provider a list of all the medicines, herbs, non-prescription drugs, or dietary supplements you use. Also tell them if you smoke, drink alcohol, or use illegal drugs. Some items may interact with your medicine. What should I watch for while using this medicine? Visit your doctor or health care professional for regular checks on your progress. Your doctor or health care professional may order blood tests and other tests to see how you are doing. Call your doctor or health care professional for advice if you get a fever, chills or sore throat, or other symptoms of a cold or flu. Do not treat yourself. This drug may decrease your body's ability to fight infection. Try to avoid being around people who are sick. You should make sure you get enough calcium and vitamin D while you are taking this medicine, unless your doctor tells you not to. Discuss the foods you eat and the vitamins you take with your health care professional. See your dentist regularly. Brush and floss your teeth as directed. Before you have any dental work done, tell your dentist you are   receiving this medicine. Do not become pregnant while taking this medicine or for 5 months after stopping it. Talk with your doctor or health care professional about your birth control options while taking this medicine. Women should inform their doctor if they wish to become pregnant or think they might be pregnant. There is a potential for serious side  effects to an unborn child. Talk to your health care professional or pharmacist for more information. What side effects may I notice from receiving this medicine? Side effects that you should report to your doctor or health care professional as soon as possible:  allergic reactions like skin rash, itching or hives, swelling of the face, lips, or tongue  bone pain  breathing problems  dizziness  jaw pain, especially after dental work  redness, blistering, peeling of the skin  signs and symptoms of infection like fever or chills; cough; sore throat; pain or trouble passing urine  signs of low calcium like fast heartbeat, muscle cramps or muscle pain; pain, tingling, numbness in the hands or feet; seizures  unusual bleeding or bruising  unusually weak or tired Side effects that usually do not require medical attention (report to your doctor or health care professional if they continue or are bothersome):  constipation  diarrhea  headache  joint pain  loss of appetite  muscle pain  runny nose  tiredness  upset stomach This list may not describe all possible side effects. Call your doctor for medical advice about side effects. You may report side effects to FDA at 1-800-FDA-1088. Where should I keep my medicine? This medicine is only given in a clinic, doctor's office, or other health care setting and will not be stored at home. NOTE: This sheet is a summary. It may not cover all possible information. If you have questions about this medicine, talk to your doctor, pharmacist, or health care provider.  2021 Elsevier/Gold Standard (2017-12-12 16:10:44)

## 2020-11-03 ENCOUNTER — Inpatient Hospital Stay: Payer: Medicare Other | Attending: Oncology

## 2020-11-03 ENCOUNTER — Other Ambulatory Visit: Payer: Self-pay

## 2020-11-03 ENCOUNTER — Other Ambulatory Visit: Payer: Self-pay | Admitting: Hematology and Oncology

## 2020-11-03 DIAGNOSIS — C61 Malignant neoplasm of prostate: Secondary | ICD-10-CM

## 2020-11-03 DIAGNOSIS — C7951 Secondary malignant neoplasm of bone: Secondary | ICD-10-CM | POA: Diagnosis present

## 2020-11-03 LAB — CBC AND DIFFERENTIAL
HCT: 38 — AB (ref 41–53)
Hemoglobin: 12.5 — AB (ref 13.5–17.5)
Neutrophils Absolute: 4.28
Platelets: 226 (ref 150–399)
WBC: 6.2

## 2020-11-03 LAB — COMPREHENSIVE METABOLIC PANEL
Albumin: 3.8 (ref 3.5–5.0)
Calcium: 8.9 (ref 8.7–10.7)

## 2020-11-03 LAB — PROTEIN, TOTAL: Total Protein: 6.1 g/dL — AB (ref 6.3–8.2)

## 2020-11-03 LAB — HEPATIC FUNCTION PANEL
ALT: 28 (ref 10–40)
AST: 28 (ref 14–40)
Alkaline Phosphatase: 90 (ref 25–125)
Bilirubin, Total: 1

## 2020-11-03 LAB — BASIC METABOLIC PANEL
BUN: 33 — AB (ref 4–21)
CO2: 25 — AB (ref 13–22)
Chloride: 107 (ref 99–108)
Creatinine: 1.7 — AB (ref 0.6–1.3)
Glucose: 115
Potassium: 4.2 (ref 3.4–5.3)
Sodium: 137 (ref 137–147)

## 2020-11-03 LAB — CORRECTED CALCIUM (CC13): Calcium, Corrected: 9.1

## 2020-11-03 LAB — CBC
MCV: 95 — AB (ref 80–94)
RBC: 4.01 (ref 3.87–5.11)

## 2020-11-04 LAB — PROSTATE-SPECIFIC AG, SERUM (LABCORP): Prostate Specific Ag, Serum: <0.1 ng/mL (ref 0.0–4.0)

## 2020-11-06 ENCOUNTER — Inpatient Hospital Stay: Payer: Medicare Other

## 2020-11-08 ENCOUNTER — Telehealth: Payer: Self-pay

## 2020-11-08 NOTE — Telephone Encounter (Signed)
Virgel Paling, scheduler is calling pt's wife with new appt.    Per Estill Dooms: sure that would be fine. :( RE: Bone injection tomorrow Received: Today    Melodye Ped, NP  Dairl Ponder, RN Phone Number: [phone]   We can push it to next week if he wants. We just need to let Manuela Schwartz know.      Pt's wife,Kathy, called to report [facility] is recovering from pneumonia. She wants to know if they should move the injection appt? I sent In Basket message to [facility].

## 2020-11-09 ENCOUNTER — Inpatient Hospital Stay: Payer: Medicare Other

## 2020-11-16 ENCOUNTER — Other Ambulatory Visit: Payer: Self-pay

## 2020-11-16 ENCOUNTER — Inpatient Hospital Stay: Payer: Medicare Other

## 2020-11-16 VITALS — BP 122/60 | HR 76 | Temp 98.4°F | Resp 18 | Wt 194.1 lb

## 2020-11-16 DIAGNOSIS — C7951 Secondary malignant neoplasm of bone: Secondary | ICD-10-CM | POA: Diagnosis not present

## 2020-11-16 DIAGNOSIS — C61 Malignant neoplasm of prostate: Secondary | ICD-10-CM

## 2020-11-16 MED ORDER — DENOSUMAB 120 MG/1.7ML ~~LOC~~ SOLN
120.0000 mg | Freq: Once | SUBCUTANEOUS | Status: AC
  Administered 2020-11-16: 120 mg via SUBCUTANEOUS

## 2020-11-16 MED ORDER — DENOSUMAB 120 MG/1.7ML ~~LOC~~ SOLN
SUBCUTANEOUS | Status: AC
  Filled 2020-11-16: qty 1.7

## 2020-11-16 NOTE — Progress Notes (Signed)
Spoke with susan pharmacist about patients weight gain, he was recently treated for PNA and he has lower leg swelling. I discussed with patient about calling his primary care doctor to notify him.

## 2020-11-16 NOTE — Patient Instructions (Signed)
Denosumab injection What is this medicine? DENOSUMAB (den oh sue mab) slows bone breakdown. Prolia is used to treat osteoporosis in women after menopause and in men, and in people who are taking corticosteroids for 6 months or more. Xgeva is used to treat a high calcium level due to cancer and to prevent bone fractures and other bone problems caused by multiple myeloma or cancer bone metastases. Xgeva is also used to treat giant cell tumor of the bone. This medicine may be used for other purposes; ask your health care provider or pharmacist if you have questions. COMMON BRAND NAME(S): Prolia, XGEVA What should I tell my health care provider before I take this medicine? They need to know if you have any of these conditions:  dental disease  having surgery or tooth extraction  infection  kidney disease  low levels of calcium or Vitamin D in the blood  malnutrition  on hemodialysis  skin conditions or sensitivity  thyroid or parathyroid disease  an unusual reaction to denosumab, other medicines, foods, dyes, or preservatives  pregnant or trying to get pregnant  breast-feeding How should I use this medicine? This medicine is for injection under the skin. It is given by a health care professional in a hospital or clinic setting. A special MedGuide will be given to you before each treatment. Be sure to read this information carefully each time. For Prolia, talk to your pediatrician regarding the use of this medicine in children. Special care may be needed. For Xgeva, talk to your pediatrician regarding the use of this medicine in children. While this drug may be prescribed for children as young as 13 years for selected conditions, precautions do apply. Overdosage: If you think you have taken too much of this medicine contact a poison control center or emergency room at once. NOTE: This medicine is only for you. Do not share this medicine with others. What if I miss a dose? It is  important not to miss your dose. Call your doctor or health care professional if you are unable to keep an appointment. What may interact with this medicine? Do not take this medicine with any of the following medications:  other medicines containing denosumab This medicine may also interact with the following medications:  medicines that lower your chance of fighting infection  steroid medicines like prednisone or cortisone This list may not describe all possible interactions. Give your health care provider a list of all the medicines, herbs, non-prescription drugs, or dietary supplements you use. Also tell them if you smoke, drink alcohol, or use illegal drugs. Some items may interact with your medicine. What should I watch for while using this medicine? Visit your doctor or health care professional for regular checks on your progress. Your doctor or health care professional may order blood tests and other tests to see how you are doing. Call your doctor or health care professional for advice if you get a fever, chills or sore throat, or other symptoms of a cold or flu. Do not treat yourself. This drug may decrease your body's ability to fight infection. Try to avoid being around people who are sick. You should make sure you get enough calcium and vitamin D while you are taking this medicine, unless your doctor tells you not to. Discuss the foods you eat and the vitamins you take with your health care professional. See your dentist regularly. Brush and floss your teeth as directed. Before you have any dental work done, tell your dentist you are   receiving this medicine. Do not become pregnant while taking this medicine or for 5 months after stopping it. Talk with your doctor or health care professional about your birth control options while taking this medicine. Women should inform their doctor if they wish to become pregnant or think they might be pregnant. There is a potential for serious side  effects to an unborn child. Talk to your health care professional or pharmacist for more information. What side effects may I notice from receiving this medicine? Side effects that you should report to your doctor or health care professional as soon as possible:  allergic reactions like skin rash, itching or hives, swelling of the face, lips, or tongue  bone pain  breathing problems  dizziness  jaw pain, especially after dental work  redness, blistering, peeling of the skin  signs and symptoms of infection like fever or chills; cough; sore throat; pain or trouble passing urine  signs of low calcium like fast heartbeat, muscle cramps or muscle pain; pain, tingling, numbness in the hands or feet; seizures  unusual bleeding or bruising  unusually weak or tired Side effects that usually do not require medical attention (report to your doctor or health care professional if they continue or are bothersome):  constipation  diarrhea  headache  joint pain  loss of appetite  muscle pain  runny nose  tiredness  upset stomach This list may not describe all possible side effects. Call your doctor for medical advice about side effects. You may report side effects to FDA at 1-800-FDA-1088. Where should I keep my medicine? This medicine is only given in a clinic, doctor's office, or other health care setting and will not be stored at home. NOTE: This sheet is a summary. It may not cover all possible information. If you have questions about this medicine, talk to your doctor, pharmacist, or health care provider.  2021 Elsevier/Gold Standard (2017-12-12 16:10:44)

## 2020-11-16 NOTE — Progress Notes (Signed)
Pt d/c stable at 1519 

## 2020-11-23 ENCOUNTER — Other Ambulatory Visit: Payer: Self-pay

## 2020-11-23 ENCOUNTER — Ambulatory Visit (INDEPENDENT_AMBULATORY_CARE_PROVIDER_SITE_OTHER): Payer: Medicare Other | Admitting: Cardiology

## 2020-11-23 ENCOUNTER — Telehealth (HOSPITAL_COMMUNITY): Payer: Self-pay | Admitting: *Deleted

## 2020-11-23 VITALS — BP 128/62 | HR 79 | Ht 69.0 in | Wt 183.2 lb

## 2020-11-23 DIAGNOSIS — E7849 Other hyperlipidemia: Secondary | ICD-10-CM

## 2020-11-23 DIAGNOSIS — I10 Essential (primary) hypertension: Secondary | ICD-10-CM

## 2020-11-23 DIAGNOSIS — K219 Gastro-esophageal reflux disease without esophagitis: Secondary | ICD-10-CM | POA: Insufficient documentation

## 2020-11-23 DIAGNOSIS — I5020 Unspecified systolic (congestive) heart failure: Secondary | ICD-10-CM

## 2020-11-23 DIAGNOSIS — I2510 Atherosclerotic heart disease of native coronary artery without angina pectoris: Secondary | ICD-10-CM

## 2020-11-23 DIAGNOSIS — E785 Hyperlipidemia, unspecified: Secondary | ICD-10-CM | POA: Insufficient documentation

## 2020-11-23 HISTORY — DX: Atherosclerotic heart disease of native coronary artery without angina pectoris: I25.10

## 2020-11-23 HISTORY — DX: Unspecified systolic (congestive) heart failure: I50.20

## 2020-11-23 NOTE — Telephone Encounter (Signed)
Patient given detailed instructions per Myocardial Perfusion Study Information Sheet for the test on 11/29/20 at 11:30. Patient notified to arrive 15 minutes early and that it is imperative to arrive on time for appointment to keep from having the test rescheduled.  If you need to cancel or reschedule your appointment, please call the office within 24 hours of your appointment. . Patient verbalized understanding.Tommy Ward

## 2020-11-23 NOTE — Progress Notes (Signed)
Cardiology Office Note:    Date:  11/23/2020   ID:  Tommy Ward, DOB 10-Sep-1940, MRN [id number]  PCP:  Serita Grammes, MD  Cardiologist:  Jenean Lindau, MD   Referring MD: Serita Grammes, MD    ASSESSMENT:    1. Essential hypertension   2. HFrEF (heart failure with reduced ejection fraction) ([place])   3. Familial hyperlipidemia   4. Primary hypertension   5. Coronary artery disease involving native coronary artery of native heart without angina pectoris    PLAN:    In order of problems listed above:  1. Coronary artery disease: This is by patient's history.  He tells me that he had a heart attack in [place] many years ago.  I do not have those details.  Secondary prevention stressed with the patient.  Importance of compliance with diet medication stressed any vocalized understanding. 2. Heart failure with reduced ejection fraction: I discussed my findings with the patient extensively.  Education was given.  Diet, weight checks, salt intake issues were discussed extensively and vocalized understanding.  He and his wife had multiple questions which were answered to their satisfaction.  I will do a Chem-7 today.  I have increased furosemide from 40 mg in the morning to 40 mg in the morning and 20 mg in evening.  Patient will do this for 1 week and then go back to 40 mg in the morning dose.  He will keep a track of his daily weights and blood pressures.  Echocardiogram will be done to further assess follow-up with ejection fraction. 3. In view of history of congestive heart failure we will do a Lexiscan sestamibi to assess objective evidence of any ischemic process. 4. Essential hypertension: Blood pressure stable and diet was emphasized. 5. Cigarette smoker: I spent 5 minutes with the patient discussing solely about smoking. Smoking cessation was counseled. I suggested to the patient also different medications and pharmacological interventions. Patient is keen to try stopping on  its own at this time. He will get back to me if he needs any further assistance in this matter. 6. Patient will be seen in follow-up appointment in 5 weeks or earlier if the patient has any concerns    Medication Adjustments/Labs and Tests Ordered: Current medicines are reviewed at length with the patient today.  Concerns regarding medicines are outlined above.  No orders of the defined types were placed in this encounter.  No orders of the defined types were placed in this encounter.    No chief complaint on file.    History of Present Illness:    Tommy Ward is a 80 y.o. male.  Patient has past medical history of cardiomyopathy.  He has history of renal insufficiency and history of coronary artery disease.  Patient mentions to me that he had a heart attack in [place] many years ago.  He was last seen by me in 2020.  He had marked dyslipidemia.  I referred him to the clinic for lipid-lowering.  Subsequently not seen in follow-up.  He mentions to me that he has been having some shortness of breath and orthopnea and PND for which his primary care provider started him on furosemide 40 mg daily and he feels much better.  His pedal edema still persist but better.  At the time of my evaluation, the patient is alert awake oriented and in no distress.  His wife accompanies him for this visit.  Past Medical History:  Diagnosis Date  . AKI (  acute kidney injury) ([place]) 11/25/2019  . 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[place]) 08/24/2019  . Aortic valve disorder 07/08/2014  . B12 deficiency 03/22/2018  . Benign hypertension with chronic kidney disease, stage III ([place]) 05/20/2019  . Cancer ([place]) 08/2008   prostate ca  . 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 Cataract 11/2013   bilateral  . Chronic renal insufficiency, stage III (moderate) ([place]) 11/27/2015  . 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 Coronary arteriosclerosis 07/06/2012  . Decreased cardiac ejection fraction  05/20/2019  . Essential hypertension 07/06/2012  . 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  . Gastroesophageal reflux disease without esophagitis 03/22/2018  . GERD (gastroesophageal reflux disease)   . Hyperkalemia 08/24/2019  . Hyperlipidemia   . Hyperphosphatemia 11/25/2019  . Hypertension   . 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  . Left bundle branch block 07/06/2012  . Malignant neoplasm of prostate ([place])   . Malignant neoplasm of prostate ([place])   . Metabolic bone disease 11/20/345  . Mixed hyperlipidemia 07/06/2012  . Olecranon bursitis of left elbow 06/25/2018  . S/P radiation therapy > 12 wks ago 2010   prostate CA  . 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[place])   . Vitamin D deficiency 08/24/2019    Past Surgical History:  Procedure Laterality Date  . CHOLECYSTECTOMY  01/2003   lap chole  . left hip repaired      Current Medications: Current Meds  Medication Sig  . aspirin EC 81 MG tablet Take 1 tablet (81 mg total) by mouth daily.  Marland Kitchen b complex vitamins capsule Take 1 capsule by mouth daily.  . calcium-vitamin D (OSCAL WITH D) 500-200 MG-UNIT tablet Take 1 tablet by mouth daily.  . cholecalciferol (VITAMIN D3) 25 MCG (1000 UT) tablet Take 1,000 Units by mouth daily.  . enzalutamide (XTANDI) 40 MG tablet Take 160 mg by mouth daily.  . Evolocumab 140 MG/ML SOAJ Inject 140 mg into the skin every 14 (fourteen) days.  Marland Kitchen lisinopril (ZESTRIL) 20 MG tablet Take 20 mg by mouth daily.  . meclizine (ANTIVERT) 25 MG tablet Take 1 tablet by mouth daily.  . metoprolol succinate (TOPROL-XL) 50 MG 24 hr tablet Take 1 tablet by mouth daily.  . metoprolol tartrate (LOPRESSOR) 50 MG tablet Take 50 mg by mouth daily.  Marland Kitchen omeprazole (PRILOSEC) 40 MG capsule Take 1 capsule by mouth daily.  . pregabalin (LYRICA) 100 MG capsule Take 100 mg by mouth 2 (two) times daily.  . tamsulosin (FLOMAX) 0.4 MG CAPS capsule Take 1 capsule by mouth daily.     Allergies:    Ezetimibe and Statins   Social History   Socioeconomic History  . Marital status: Married    Spouse name: Not on file  . Number of children: Not on file  . Years of education: Not on file  . Highest education level: Not on file  Occupational History  . Not on file  Tobacco Use  . Smoking status: Current Every Day Smoker    Packs/day: 0.75    Years: 50.00    Pack years: 37.50  . Smokeless tobacco: Never Used  Vaping Use  . Vaping Use: Never used  Substance and Sexual Activity  . Alcohol use: Not Currently  . Drug use: Never  . Sexual activity: Not on file  Other Topics Concern  . Not on file  Social History Narrative  . Not on file   Social Determinants of Health   Financial Resource Strain:  Not on file  Food Insecurity: Not on file  Transportation Needs: Not on file  Physical Activity: Not on file  Stress: Not on file  Social Connections: Not on file     Family History: The patient's family history includes Heart attack in his brother; Leukemia in his mother; Prostate cancer in his father. There is no history of Colon cancer, Rectal cancer, Stomach cancer, or Esophageal cancer.  ROS:   Please see the history of present illness.    All other systems reviewed and are negative.  EKGs/Labs/Other Studies Reviewed:    The following studies were reviewed today: EKG reveals sinus rhythm with nonspecific ST-T changes.  Intraventricular conduction delay was noted.   Recent Labs: 11/03/2020: ALT 28; BUN 33; Creatinine 1.7; Hemoglobin 12.5; Platelets 226; Potassium 4.2; Sodium 137  Recent Lipid Panel    Component Value Date/Time   CHOL 193 07/07/2020 0000   CHOL 400 (H) 02/25/2019 0905   TRIG 261 (A) 07/07/2020 0000   HDL 52 07/07/2020 0000   HDL 51 02/25/2019 0905   CHOLHDL 7.8 (H) 02/25/2019 0905   LDLCALC 89 07/07/2020 0000   LDLCALC 290 (H) 02/25/2019 0905    Physical Exam:    VS:  BP 128/62   Pulse 79   Ht 5\' 9"  (1.753 m)   Wt 183 lb 3.2 oz (83.1 kg)    SpO2 95%   BMI 27.05 kg/m     Wt Readings from Last 3 Encounters:  11/23/20 183 lb 3.2 oz (83.1 kg)  11/16/20 194 lb 1.9 oz (88.1 kg)  10/10/20 186 lb (84.4 kg)     GEN: Patient is in no acute distress HEENT: Normal NECK: No JVD; No carotid bruits LYMPHATICS: No lymphadenopathy CARDIAC: Hear sounds regular, 2/6 systolic murmur at the apex. RESPIRATORY:  Clear to auscultation without rales, wheezing or rhonchi  ABDOMEN: Soft, non-tender, non-distended MUSCULOSKELETAL: Bilateral 1+ edema; No deformity  SKIN: Warm and dry NEUROLOGIC:  Alert and oriented x 3 PSYCHIATRIC:  Normal affect   Signed, Jenean Lindau, MD  11/23/2020 1:36 PM     [facility]

## 2020-11-23 NOTE — Patient Instructions (Signed)
Medication Instructions:  Your physician has recommended you make the following change in your medication:   Take 40 mg Lasix daily and for 1 week take 20 mg Lasix at night.  *If you need a refill on your cardiac medications before your next appointment, please call your pharmacy*   Lab Work: Your physician recommends that you have a BMET today in the office.  If you have labs (blood work) drawn today and your tests are completely normal, you will receive your results only by: Marland Kitchen MyChart Message (if you have MyChart) OR . A paper copy in the mail If you have any lab test that is abnormal or we need to change your treatment, we will call you to review the results.   Testing/Procedures: Your physician has requested that you have an echocardiogram. Echocardiography is a painless test that uses sound waves to create images of your heart. It provides your doctor with information about the size and shape of your heart and how well your heart's chambers and valves are working. This procedure takes approximately one hour. There are no restrictions for this procedure.  Your physician has requested that you have a lexiscan myoview. For further information please visit [email or web address]. Please follow instruction sheet, as given.  The test will take approximately 3 to 4 hours to complete; you may bring reading material.  If someone comes with you to your appointment, they will need to remain in the main lobby due to limited space in the testing area.   How to prepare for your Myocardial Perfusion Test: . Do not eat or drink 3 hours prior to your test, except you may have water. . Do not consume products containing caffeine (regular or decaffeinated) 12 hours prior to your test. (ex: coffee, chocolate, sodas, tea). . Do bring a list of your current medications with you.  If not listed below, you may take your medications as normal. . Do wear comfortable clothes (no dresses or overalls) and walking  shoes, tennis shoes preferred (No heels or open toe shoes are allowed). . Do NOT wear cologne, perfume, aftershave, or lotions (deodorant is allowed). . If these instructions are not followed, your test will have to be rescheduled.    Follow-Up: At [facility], you and your health needs are our priority.  As part of our continuing mission to provide you with exceptional heart care, we have created designated Provider Care Teams.  These Care Teams include your primary Cardiologist (physician) and Advanced Practice Providers (APPs -  Physician Assistants and Nurse Practitioners) who all work together to provide you with the care you need, when you need it.  We recommend signing up for the patient portal called "MyChart".  Sign up information is provided on this After Visit Summary.  MyChart is used to connect with patients for Virtual Visits (Telemedicine).  Patients are able to view lab/test results, encounter notes, upcoming appointments, etc.  Non-urgent messages can be sent to your provider as well.   To learn more about what you can do with MyChart, go to [email or web address].    Your next appointment:   5 week(s)  The format for your next appointment:   In Person  Provider:   Jyl Heinz, MD   Other Instructions  Cardiac Nuclear Scan  A cardiac nuclear scan is a test that is done to check the flow of blood to your heart. It is done when you are resting and when you are exercising. The test looks for problems such as:  Not enough blood reaching a portion of the heart.  The heart muscle not working as it should. You may need this test if:  You have heart disease.  You have had lab results that are not normal.  You have had heart surgery or a balloon procedure to open up blocked arteries (angioplasty).  You have chest pain.  You have shortness of breath. In this test, a special dye (tracer) is put into your bloodstream. The tracer will travel to your heart. A  camera will then take pictures of your heart to see how the tracer moves through your heart. This test is usually done at a hospital and takes 2-4 hours. Tell a doctor about:  Any allergies you have.  All medicines you are taking, including vitamins, herbs, eye drops, creams, and over-the-counter medicines.  Any problems you or family members have had with anesthetic medicines.  Any blood disorders you have.  Any surgeries you have had.  Any medical conditions you have.  Whether you are pregnant or may be pregnant. What are the risks? Generally, this is a safe test. However, problems may occur, such as:  Serious chest pain and heart attack. This is only a risk if the stress portion of the test is done.  Rapid heartbeat.  A feeling of warmth in your chest. This feeling usually does not last long.  Allergic reaction to the tracer. What happens before the test?  Ask your doctor about changing or stopping your normal medicines. This is important.  Follow instructions from your doctor about what you cannot eat or drink.  Remove your jewelry on the day of the test. What happens during the test? 1. An IV tube will be inserted into one of your veins. 2. Your doctor will give you a small amount of tracer through the IV tube. 3. You will wait for 20-40 minutes while the tracer moves through your bloodstream. 4. Your heart will be monitored with an electrocardiogram (ECG). 5. You will lie down on an exam table. 6. Pictures of your heart will be taken for about 15-20 minutes. 7. You may also have a stress test. For this test, one of these things may be done: ? You will be asked to exercise on a treadmill or a stationary bike. ? You will be given medicines that will make your heart work harder. This is done if you are unable to exercise. 8. When blood flow to your heart has peaked, a tracer will again be given through the IV tube. 9. After 20-40 minutes, you will get back on the exam  table. More pictures will be taken of your heart. 10. Depending on the tracer that is used, more pictures may need to be taken 3-4 hours later. 11. Your IV tube will be removed when the test is over. The test may vary among doctors and hospitals. What happens after the test? 1. Ask your doctor: ? Whether you can return to your normal schedule, including diet, activities, and medicines. ? Whether you should drink more fluids. This will help to remove the tracer from your body. Drink enough fluid to keep your pee (urine) pale yellow. 2. Ask your doctor, or the department that is doing the test: ? When will my results be ready? ? How will I get my results? Summary  A cardiac nuclear scan is a test that is done to check the flow of blood to your heart.  Tell your doctor whether you are pregnant or may be pregnant.  Before the test, ask your doctor about changing or stopping your normal medicines. This is important.  Ask your doctor whether you can return to your normal activities. You may be asked to drink more fluids. This information is not intended to replace advice given to you by your health care provider. Make sure you discuss any questions you have with your health care provider. Document Revised: 11/25/2018 Document Reviewed: 01/19/2018 Elsevier Patient Education  [place].  Echocardiogram An echocardiogram is a procedure that uses painless sound waves (ultrasound) to produce an image of the heart. Images from an echocardiogram can provide important information about:  Signs of coronary artery disease (CAD).  Aneurysm detection. An aneurysm is a weak or damaged part of an artery wall that bulges out from the normal force of blood pumping through the body.  Heart size and shape. Changes in the size or shape of the heart can be associated with certain conditions, including heart failure, aneurysm, and CAD.  Heart muscle function.  Heart valve function.  Signs of a  past heart attack.  Fluid buildup around the heart.  Thickening of the heart muscle.  A tumor or infectious growth around the heart valves. Tell a health care provider about:  Any allergies you have.  All medicines you are taking, including vitamins, herbs, eye drops, creams, and over-the-counter medicines.  Any blood disorders you have.  Any surgeries you have had.  Any medical conditions you have.  Whether you are pregnant or may be pregnant. What are the risks? Generally, this is a safe procedure. However, problems may occur, including:  Allergic reaction to dye (contrast) that may be used during the procedure. What happens before the procedure? No specific preparation is needed. You may eat and drink normally. What happens during the procedure?    An IV tube may be inserted into one of your veins.  You may receive contrast through this tube. A contrast is an injection that improves the quality of the pictures from your heart.  A gel will be applied to your chest.  A wand-like tool (transducer) will be moved over your chest. The gel will help to transmit the sound waves from the transducer.  The sound waves will harmlessly bounce off of your heart to allow the heart images to be captured in real-time motion. The images will be recorded on a computer. The procedure may vary among health care providers and hospitals. What happens after the procedure?  You may return to your normal, everyday life, including diet, activities, and medicines, unless your health care provider tells you not to do that. Summary  An echocardiogram is a procedure that uses painless sound waves (ultrasound) to produce an image of the heart.  Images from an echocardiogram can provide important information about the size and shape of your heart, heart muscle function, heart valve function, and fluid buildup around your heart.  You do not need to do anything to prepare before this procedure. You  may eat and drink normally.  After the echocardiogram is completed, you may return to your normal, everyday life, unless your health care provider tells you not to do that. This information is not intended to replace advice given to you by your health care provider. Make sure you discuss any questions you have with your health care provider. Document Revised: 11/26/2018 Document Reviewed: 09/07/2016 Elsevier Patient Education  [place].

## 2020-11-24 LAB — BASIC METABOLIC PANEL
BUN/Creatinine Ratio: 15 (ref 10–24)
BUN: 25 mg/dL (ref 8–27)
CO2: 28 mmol/L (ref 20–29)
Calcium: 9.7 mg/dL (ref 8.6–10.2)
Chloride: 99 mmol/L (ref 96–106)
Creatinine, Ser: 1.63 mg/dL — ABNORMAL HIGH (ref 0.76–1.27)
Glucose: 120 mg/dL — ABNORMAL HIGH (ref 65–99)
Potassium: 4 mmol/L (ref 3.5–5.2)
Sodium: 146 mmol/L — ABNORMAL HIGH (ref 134–144)
eGFR: 43 mL/min/{1.73_m2} — ABNORMAL LOW (ref >59)

## 2020-11-29 ENCOUNTER — Ambulatory Visit (INDEPENDENT_AMBULATORY_CARE_PROVIDER_SITE_OTHER): Payer: Medicare Other

## 2020-11-29 ENCOUNTER — Other Ambulatory Visit: Payer: Self-pay

## 2020-11-29 DIAGNOSIS — I2510 Atherosclerotic heart disease of native coronary artery without angina pectoris: Secondary | ICD-10-CM

## 2020-11-29 MED ORDER — REGADENOSON 0.4 MG/5ML IV SOLN
0.4000 mg | Freq: Once | INTRAVENOUS | Status: AC
  Administered 2020-11-29: 0.4 mg via INTRAVENOUS

## 2020-11-29 MED ORDER — TECHNETIUM TC 99M TETROFOSMIN IV KIT
30.4000 | PACK | Freq: Once | INTRAVENOUS | Status: AC | PRN
  Administered 2020-11-29: 30.4 via INTRAVENOUS

## 2020-11-29 MED ORDER — TECHNETIUM TC 99M TETROFOSMIN IV KIT
10.8000 | PACK | Freq: Once | INTRAVENOUS | Status: AC | PRN
  Administered 2020-11-29: 10.8 via INTRAVENOUS

## 2020-11-30 LAB — MYOCARDIAL PERFUSION IMAGING
LV dias vol: 342 mL (ref 62–150)
LV sys vol: 285 mL
SDS: 5
SRS: 15
SSS: 20
TID: 1.07

## 2020-12-01 ENCOUNTER — Other Ambulatory Visit: Payer: Self-pay

## 2020-12-01 ENCOUNTER — Ambulatory Visit (INDEPENDENT_AMBULATORY_CARE_PROVIDER_SITE_OTHER): Payer: Medicare Other

## 2020-12-01 ENCOUNTER — Telehealth: Payer: Self-pay

## 2020-12-01 DIAGNOSIS — I2510 Atherosclerotic heart disease of native coronary artery without angina pectoris: Secondary | ICD-10-CM

## 2020-12-01 DIAGNOSIS — I5020 Unspecified systolic (congestive) heart failure: Secondary | ICD-10-CM

## 2020-12-01 LAB — ECHOCARDIOGRAM COMPLETE
AR max vel: 1.09 cm2
AV Area VTI: 1.05 cm2
AV Area mean vel: 1.22 cm2
AV Mean grad: 4 mmHg
AV Peak grad: 7.3 mmHg
Ao pk vel: 1.35 m/s
Area-P 1/2: 4.33 cm2
Calc EF: 18.7 %
MV M vel: 4.23 m/s
MV Peak grad: 71.6 mmHg
Radius: 0.9 cm
S' Lateral: 5.6 cm
Single Plane A2C EF: 21.8 %
Single Plane A4C EF: 17.4 %

## 2020-12-01 NOTE — Telephone Encounter (Signed)
-----   Message from Tommy Priest, MD sent at 12/01/2020  7:58 AM EDT ----- This is very complicated, patient of Dr. [name]  He needs to be seen and worked into his schedule.  In the first day he is back in the office When he has his cardiac echo being done, if it cannot be done in our office in the next 1 to 2 weeks moved to [facility].

## 2020-12-01 NOTE — Telephone Encounter (Signed)
Spoke with patient regarding results and recommendation.  Patient verbalizes understanding and is agreeable to plan of care. Advised patient to call back with any issues or concerns.  

## 2020-12-03 IMAGING — CT NUCLEAR MEDICINE  NOPR SKULL BASE TO THIGH
1 of 8 series · 3 of 16 positions shown, 4 images · non-contrast
Comparison: None

CLINICAL DATA: Prostate carcinoma with biochemical recurrence. PSA
equal

EXAM:
NUCLEAR MEDICINE PET SKULL BASE TO THIGH
TECHNIQUE: 8.6 mCi F-18 Fluciclovine was injected intravenously. Full-ring PET
imaging was performed from the skull base to thigh after the
radiotracer. CT data was obtained and used for attenuation
correction and anatomic localization.

[Series 4: ct sk_thigh 5.0 b31f · axial · 0.98mm/px · z∈[+811,+1707]mm · 3 of 225 slices shown, 4 images]
[im 1/225  soft-tissue]
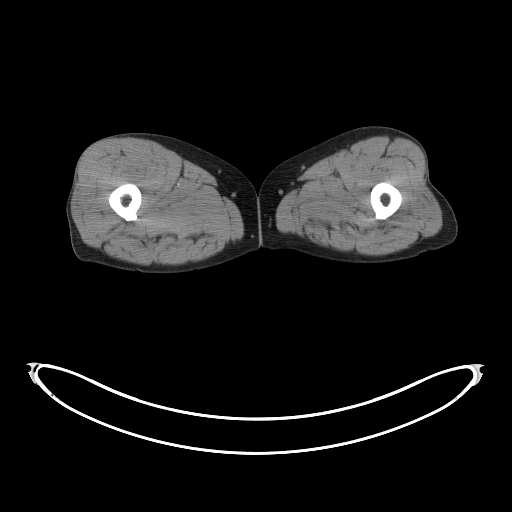
[im 1/225  bone]
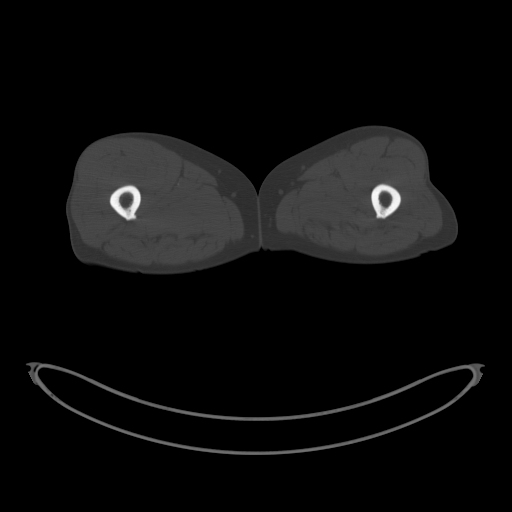
[im 113/225  soft-tissue]
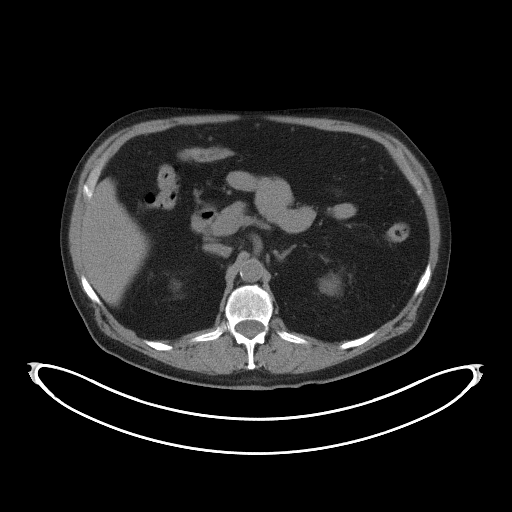
[im 225/225  soft-tissue]
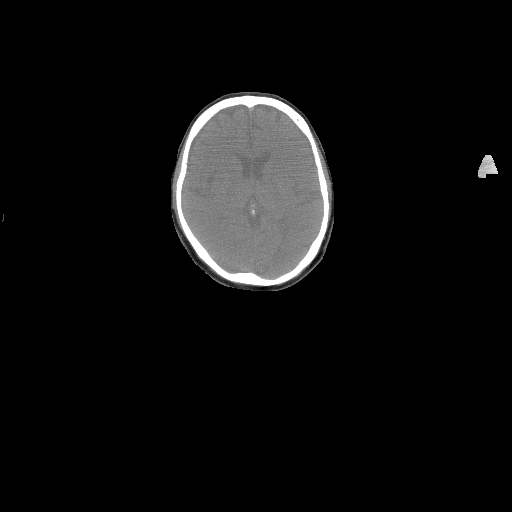

[3 of 16 positions shown; findings below may reference images not displayed]

FINDINGS: NECK

No radiotracer activity in neck lymph nodes.

Incidental CT finding: None

CHEST

No radiotracer accumulation within mediastinal or hilar lymph nodes.
No suspicious pulmonary nodules on the CT scan.

Incidental CT finding: None

ABDOMEN/PELVIS

Prostate: No focal activity in the prostate bed.

Lymph nodes: No abnormal radiotracer accumulation within pelvic or
abdominal nodes.

Liver: No evidence of liver metastasis

Incidental CT finding: None

SKELETON

Focal activity within the LEFT sacral ala with SUV max equal
(image 71). This corresponds to a subtle intermediate density lesion
on CT measuring 2.2 cm.

No additional lesions in the skeleton accumulate the prostate cancer
specific radiotracer.
IMPRESSION: 1. Lesion in the LEFT sacral ala accumulates the prostate cancer
specific radiotracer and consistent prostate cancer metastasis.
2. No evidence of local recurrence or nodal metastasis in the
pelvis.
3. No soft tissue metastasis.

## 2020-12-04 ENCOUNTER — Other Ambulatory Visit: Payer: Self-pay

## 2020-12-04 NOTE — Telephone Encounter (Signed)
Patient's wife is following up. She is requesting to have the patient worked into Dr. Julien Nordmann schedule as discussed.

## 2020-12-04 NOTE — Telephone Encounter (Signed)
Spoke with the patients wife just now and she advised to me that on Friday night Dr. Bettina Gavia called them directly and advised that they needed to make an office appointment for ASAP with any provider. The patient was scheduled with Dr. Harriet Masson tomorrow in [facility]    Encouraged patient to call back with any questions or concerns.

## 2020-12-05 ENCOUNTER — Other Ambulatory Visit: Payer: Self-pay

## 2020-12-05 ENCOUNTER — Encounter: Payer: Self-pay | Admitting: Cardiology

## 2020-12-05 ENCOUNTER — Ambulatory Visit (INDEPENDENT_AMBULATORY_CARE_PROVIDER_SITE_OTHER): Payer: Medicare Other | Admitting: Cardiology

## 2020-12-05 ENCOUNTER — Telehealth: Payer: Self-pay | Admitting: Oncology

## 2020-12-05 VITALS — BP 100/64 | HR 63 | Ht 69.0 in | Wt 178.1 lb

## 2020-12-05 DIAGNOSIS — R0989 Other specified symptoms and signs involving the circulatory and respiratory systems: Secondary | ICD-10-CM

## 2020-12-05 DIAGNOSIS — Z720 Tobacco use: Secondary | ICD-10-CM

## 2020-12-05 DIAGNOSIS — R7303 Prediabetes: Secondary | ICD-10-CM

## 2020-12-05 DIAGNOSIS — I272 Pulmonary hypertension, unspecified: Secondary | ICD-10-CM

## 2020-12-05 DIAGNOSIS — R9439 Abnormal result of other cardiovascular function study: Secondary | ICD-10-CM

## 2020-12-05 DIAGNOSIS — I361 Nonrheumatic tricuspid (valve) insufficiency: Secondary | ICD-10-CM

## 2020-12-05 DIAGNOSIS — I420 Dilated cardiomyopathy: Secondary | ICD-10-CM

## 2020-12-05 DIAGNOSIS — R931 Abnormal findings on diagnostic imaging of heart and coronary circulation: Secondary | ICD-10-CM

## 2020-12-05 DIAGNOSIS — I340 Nonrheumatic mitral (valve) insufficiency: Secondary | ICD-10-CM

## 2020-12-05 HISTORY — DX: Abnormal result of other cardiovascular function study: R94.39

## 2020-12-05 HISTORY — DX: Tobacco use: Z72.0

## 2020-12-05 HISTORY — DX: Abnormal findings on diagnostic imaging of heart and coronary circulation: R93.1

## 2020-12-05 HISTORY — DX: Nonrheumatic tricuspid (valve) insufficiency: I36.1

## 2020-12-05 HISTORY — DX: Prediabetes: R73.03

## 2020-12-05 HISTORY — DX: Pulmonary hypertension, unspecified: I27.20

## 2020-12-05 HISTORY — DX: Other specified symptoms and signs involving the circulatory and respiratory systems: R09.89

## 2020-12-05 HISTORY — DX: Dilated cardiomyopathy: I42.0

## 2020-12-05 HISTORY — DX: Nonrheumatic mitral (valve) insufficiency: I34.0

## 2020-12-05 MED ORDER — METOPROLOL SUCCINATE ER 25 MG PO TB24: 12.5000 mg | ORAL_TABLET | Freq: Every day | ORAL | 3 refills | Status: DC

## 2020-12-05 MED ORDER — METOPROLOL SUCCINATE ER 25 MG PO TB24: 12.5000 mg | ORAL_TABLET | Freq: Every day | ORAL | 1 refills | Status: DC

## 2020-12-05 NOTE — Telephone Encounter (Signed)
Patient's spouse rescheduled 4/22 Appt's to 4/25 due to patient having [facility] Catheterization on 4/22

## 2020-12-05 NOTE — Patient Instructions (Addendum)
Medication Instructions:  Your physician has recommended you make the following change in your medication:  STOP: Lopressor START: Toprol-XL 12.5 mg once daily *If you need a refill on your cardiac medications before your next appointment, please call your pharmacy*   Lab Work: Your physician recommends that you return for lab work: TODAY: BMET, Mag, CBC, HbA1C If you have labs (blood work) drawn today and your tests are completely normal, you will receive your results only by: Marland Kitchen MyChart Message (if you have MyChart) OR . A paper copy in the mail If you have any lab test that is abnormal or we need to change your treatment, we will call you to review the results.   Testing/Procedures: Zoll Life Vest. Dorian Pod will contact you [phone].   [facility] [address] [place], [place] [address] Dept: [phone] Loc: [phone]  Tyjay Galindo [facility]  12/05/2020  You are scheduled for a Cardiac Catheterization on Friday, April 22 with Dr. Kathlyn Sacramento.  1. Please arrive at the [facility] (Main Entrance A) at [facility]: [address] [place], [place] [address] at 7:00 AM (This time is two hours before your procedure to ensure your preparation). Free valet parking service is available.   Special note: Every effort is made to have your procedure done on time. Please understand that emergencies sometimes delay scheduled procedures.  2. Diet: Do not eat solid foods after midnight.  The patient may have clear liquids until 5am upon the day of the procedure.  3. Labs: You will need to have blood drawn on TODAY  4. Medication instructions in preparation for your procedure:   Contrast Allergy: No   On the morning of your procedure, take your Aspirin and any morning medicines NOT listed above.  You may use sips of water.  5. Plan for one night stay--bring personal belongings. 6. Bring  a current list of your medications and current insurance cards. 7. You MUST have a responsible person to drive you home. 8. Someone MUST be with you the first 24 hours after you arrive home or your discharge will be delayed. 9. Please wear clothes that are easy to get on and off and wear slip-on shoes.  Thank you for allowing [place] to care for you!   -- [place] [facility]    Follow-Up: At [facility], you and your health needs are our priority.  As part of our continuing mission to provide you with exceptional heart care, we have created designated Provider Care Teams.  These Care Teams include your primary Cardiologist (physician) and Advanced Practice Providers (APPs -  Physician Assistants and Nurse Practitioners) who all work together to provide you with the care you need, when you need it.  We recommend signing up for the patient portal called "MyChart".  Sign up information is provided on this After Visit Summary.  MyChart is used to connect with patients for Virtual Visits (Telemedicine).  Patients are able to view lab/test results, encounter notes, upcoming appointments, etc.  Non-urgent messages can be sent to your provider as well.   To learn more about what you can do with MyChart, go to [email or web address].    Your next appointment:   2 week(s)  The format for your next appointment:   In Person  Provider:   Jyl Heinz, MD   Other Instructions

## 2020-12-05 NOTE — H&P (View-Only) (Signed)
Cardiology Office Note:    Date:  12/05/2020   ID:  Tommy Ward, DOB 06-10-1941, MRN [id number]  PCP:  Serita Grammes, MD  Cardiologist:  Pixie Casino, MD  Electrophysiologist:  None   Referring MD: Serita Grammes, MD   I am doing okay  History of Present Illness:    Tommy Ward is a 80 y.o. 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  Patient follows with Dr. [name] with his primary cardiologist as well as Dr. Debara Pickett for the lipid clinic.  The patient is here today to discuss his testing results, his primary cardiologist is out of the office.  The patient is here today with his wife. . He does not offer any complaints at this time.  Past Medical History:  Diagnosis Date  . AKI (acute kidney injury) ([place]) 11/25/2019  . 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[place]) 08/24/2019  . Aortic valve disorder 07/08/2014  . B12 deficiency 03/22/2018  . Benign hypertension with chronic kidney disease, stage III ([place]) 05/20/2019  . CAD (coronary artery disease) 11/23/2020  . Cancer ([place]) 08/2008   prostate ca  . 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 Cataract 11/2013   bilateral  . Chronic renal insufficiency, stage III (moderate) ([place]) 11/27/2015  . 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 Coronary arteriosclerosis 07/06/2012  . Decreased cardiac ejection fraction 05/20/2019  . Essential hypertension 07/06/2012  . 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  . Gastroesophageal reflux disease without esophagitis 03/22/2018  . GERD (gastroesophageal reflux disease)   . HFrEF (heart failure with reduced ejection fraction) ([place]) 11/23/2020  . Hyperkalemia 08/24/2019  . Hyperlipidemia   .  Hyperphosphatemia 11/25/2019  . Hypertension   . 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  . Left bundle branch block 07/06/2012  . Malignant neoplasm of prostate ([place])   . Malignant neoplasm of prostate ([place])   . Metabolic bone disease 09/26/3149  . Mixed hyperlipidemia 07/06/2012  . Olecranon bursitis of left elbow 06/25/2018  . S/P radiation therapy > 12 wks ago 2010   prostate CA  . 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[place])   . Vitamin D deficiency 08/24/2019    Past Surgical History:  Procedure Laterality Date  . CHOLECYSTECTOMY  01/2003   lap chole  . left hip repaired      Current Medications: Current Meds  Medication Sig  . aspirin EC 81 MG tablet Take 1 tablet (81 mg total) by mouth daily.  Marland Kitchen b complex vitamins capsule Take 1 capsule by mouth daily.  . calcium-vitamin D (OSCAL WITH D) 500-200 MG-UNIT tablet Take 1 tablet by mouth daily.  . cholecalciferol (VITAMIN D3) 25 MCG (1000 UT) tablet Take 1,000 Units by mouth daily.  . Denosumab (XGEVA [place]) Inject 1 application into the skin every 30 (thirty) days. Strength unknown  . enzalutamide (XTANDI) 40 MG tablet Take 160 mg by mouth daily.  . Evolocumab 140 MG/ML SOAJ Inject 140 mg into the skin every 14 (fourteen) days.  . furosemide (LASIX) 40 MG tablet Take 40 mg by mouth in the morning. And take 20 mg in the evening  . meclizine (ANTIVERT) 25 MG tablet Take 1 tablet by mouth daily.  . metoprolol succinate (TOPROL XL) 25 MG 24  hr tablet Take 0.5 tablets (12.5 mg total) by mouth daily.  . metoprolol succinate (TOPROL XL) 25 MG 24 hr tablet Take 0.5 tablets (12.5 mg total) by mouth daily.  Marland Kitchen omeprazole (PRILOSEC) 40 MG capsule Take 1 capsule by mouth daily.  . pregabalin (LYRICA) 100 MG capsule Take 100 mg by mouth as needed for pain.  . tamsulosin (FLOMAX) 0.4 MG CAPS capsule Take 1 capsule by mouth daily.  . [DISCONTINUED] metoprolol tartrate (LOPRESSOR) 50 MG tablet Take 50 mg  by mouth daily.     Allergies:   Ezetimibe and Statins   Social History   Socioeconomic History  . Marital status: Married    Spouse name: Not on file  . Number of children: Not on file  . Years of education: Not on file  . Highest education level: Not on file  Occupational History  . Not on file  Tobacco Use  . Smoking status: Current Every Day Smoker    Packs/day: 0.75    Years: 50.00    Pack years: 37.50  . Smokeless tobacco: Never Used  Vaping Use  . Vaping Use: Never used  Substance and Sexual Activity  . Alcohol use: Not Currently  . Drug use: Never  . Sexual activity: Not on file  Other Topics Concern  . Not on file  Social History Narrative  . Not on file   Social Determinants of Health   Financial Resource Strain: Not on file  Food Insecurity: Not on file  Transportation Needs: Not on file  Physical Activity: Not on file  Stress: Not on file  Social Connections: Not on file     Family History: The patient's family history includes Heart attack in his brother; Leukemia in his mother; Prostate cancer in his father. There is no history of Colon cancer, Rectal cancer, Stomach cancer, or Esophageal cancer.  ROS:   Review of Systems  Constitution: Negative for decreased appetite, fever and weight gain.  HENT: Negative for congestion, ear discharge, hoarse voice and sore throat.   Eyes: Negative for discharge, redness, vision loss in right eye and visual halos.  Cardiovascular: Negative for chest pain, dyspnea on exertion, leg swelling, orthopnea and palpitations.  Respiratory: Negative for cough, hemoptysis, shortness of breath and snoring.   Endocrine: Negative for heat intolerance and polyphagia.  Hematologic/Lymphatic: Negative for bleeding problem. Does not bruise/bleed easily.  Skin: Negative for flushing, nail changes, rash and suspicious lesions.  Musculoskeletal: Negative for arthritis, joint pain, muscle cramps, myalgias, neck pain and stiffness.   Gastrointestinal: Negative for abdominal pain, bowel incontinence, diarrhea and excessive appetite.  Genitourinary: Negative for decreased libido, genital sores and incomplete emptying.  Neurological: Negative for brief paralysis, focal weakness, headaches and loss of balance.  Psychiatric/Behavioral: Negative for altered mental status, depression and suicidal ideas.  Allergic/Immunologic: Negative for HIV exposure and persistent infections.    EKGs/Labs/Other Studies Reviewed:    The following studies were reviewed today:   EKG:  None today  TTE 12/01/2020 IMPRESSIONS  1. Deterioration of the EF as compared to echo from 2020 (was 40-45% now <20%). Left ventricular ejection fraction, by estimation, is <20%. The left ventricle has severely decreased function. The left ventricle has no regional wall motion abnormalities.  The left ventricular internal cavity size was moderately to severely dilated. There is mild left ventricular hypertrophy. Left ventricular diastolic parameters are consistent with Grade II diastolic dysfunction  (pseudonormalization).  2. Right ventricular systolic function is normal. The right ventricular size is normal. There is  severely elevated pulmonary artery systolic pressure.  3. Left atrial size was severely dilated.  4. The mitral valve is normal in structure. Severe mitral valve regurgitation. No evidence of mitral stenosis. 5. Tricuspid valve regurgitation is moderate to severe.  6. The aortic valve is normal in structure. Aortic valve regurgitation is not visualized. No aortic stenosis is present.  7. The inferior vena cava is normal in size with greater than 50% respiratory variability, suggesting right atrial pressure of 3 mmHg.   FINDINGS  Left Ventricle: Deterioration of the EF as compared to echo from 2020 (was 40-45% now <20%). Left ventricular ejection fraction, by estimation, is <20%. The left ventricle has severely decreased function. The left  ventricle has no regional wall motion abnormalities. The left ventricular internal cavity size was moderately to severely dilated. There is mild left ventricular hypertrophy. Abnormal (paradoxical) septal motion, consistent with left bundle branch block. Left ventricular diastolic parameters  are consistent with Grade II diastolic dysfunction (pseudonormalization).   Right Ventricle: The right ventricular size is normal. No increase in right ventricular wall thickness. Right ventricular systolic function is  normal. There is severely elevated pulmonary artery systolic pressure. The  tricuspid regurgitant velocity is  3.71 m/s, and with an assumed right atrial pressure of 20 mmHg, the  estimated right ventricular systolic pressure is 11.9 mmHg.   Left Atrium: Left atrial size was severely dilated.   Right Atrium: Right atrial size was normal in size.   Pericardium: There is no evidence of pericardial effusion.   Mitral Valve: The mitral valve is normal in structure. Severe mitral valve regurgitation. No evidence of mitral valve stenosis.   Tricuspid Valve: The tricuspid valve is normal in structure. Tricuspid valve regurgitation is moderate to severe. No evidence of tricuspid stenosis.   Aortic Valve: The aortic valve is normal in structure. Aortic valve regurgitation is not visualized. No aortic stenosis is present. Aortic valve mean gradient measures 4.0 mmHg. Aortic valve peak gradient measures  7.3 mmHg. Aortic valve area, by VTI  measures 1.05 cm.   Pulmonic Valve: The pulmonic valve was normal in structure. Pulmonic valve regurgitation is not visualized. No evidence of pulmonic stenosis.   Aorta: The aortic root is normal in size and structure.   Venous: The inferior vena cava is normal in size with greater than 50%  respiratory variability, suggesting right atrial pressure of 3 mmHg.   IAS/Shunts: No atrial level shunt detected by color flow Doppler.     Pharmacologic  nuclear stress 11/2020  The left ventricular ejection fraction is severely decreased (<30%).  Nuclear stress EF: 16%.  Defect 1: There is a large mild reversible defect of severe severity present in the basal anterior, basal anteroseptal, mid anterior, mid anteroseptal, apical anterior, apical septal, apical inferior, apical lateral and apex location.  Defect 2: There is a medium fixed defect of moderate severity present in the basal inferoseptal and basal inferior location with hypokinesis in the basal inferior and inferoseptal.  This is a high risk study.  Findings consistent with prior myocardial infarction with peri-infarct ischemia.   The large mild reversible defect which is a myocardial infarction with peri-infarct ischemia is in the LAD territory.  With worsening ejection fraction compared to prior Myoview in 2020.  Given the significant depressed ejection fraction and ischemia this is a high risk study.  Recommend cardiac catheterization.   Recent Labs: 11/03/2020: ALT 28; Hemoglobin 12.5; Platelets 226 11/23/2020: BUN 25; Creatinine, Ser 1.63; Potassium 4.0; Sodium 146  Recent Lipid  Panel    Component Value Date/Time   CHOL 193 07/07/2020 0000   CHOL 400 (H) 02/25/2019 0905   TRIG 261 (A) 07/07/2020 0000   HDL 52 07/07/2020 0000   HDL 51 02/25/2019 0905   CHOLHDL 7.8 (H) 02/25/2019 0905   LDLCALC 89 07/07/2020 0000   LDLCALC 290 (H) 02/25/2019 0905    Physical Exam:    VS:  BP 100/64   Pulse 63   Ht 5\' 9"  (1.753 m)   Wt 178 lb 1.9 oz (80.8 kg)   SpO2 98%   BMI 26.30 kg/m     Wt Readings from Last 3 Encounters:  12/05/20 178 lb 1.9 oz (80.8 kg)  11/29/20 183 lb (83 kg)  11/23/20 183 lb 3.2 oz (83.1 kg)     GEN: Well nourished, well developed in no acute distress HEENT: Normal NECK: No JVD; No carotid bruits LYMPHATICS: No lymphadenopathy CARDIAC: S1S2 noted,RRR, no murmurs, rubs, gallops RESPIRATORY:  Clear to auscultation without rales, wheezing or  rhonchi  ABDOMEN: Soft, non-tender, non-distended, +bowel sounds, no guarding. EXTREMITIES: No edema, No cyanosis, no clubbing MUSCULOSKELETAL:  No deformity  SKIN: Warm and dry NEUROLOGIC:  Alert and oriented x 3, non-focal PSYCHIATRIC:  Normal affect, good insight  ASSESSMENT:    1. Abnormal stress test   2. Depressed left ventricular ejection fraction   3. Severe mitral regurgitation   4. Pre-diabetes   5. 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[place])   6. Pulmonary hypertension ([place])   7. Nonrheumatic tricuspid valve regurgitation   8. Tobacco use   9. Dilated cardiomyopathy (HCC)    PLAN:    In the setting of high risk normal nuclear stress test with peri-infarct ischemia and his worsening ejection fraction to 20% from 40 to 45% on his recent echocardiogram, I would like to proceed with a right and left heart catheterization in this patient to definitely rule out any ischemic etiology.    I spoke with the patient about his educated him about the heart catheterization, the patient understands that risks include but are not limited to stroke (1 in 1000), death (1 in 1000), kidney failure [usually temporary] (1 in 500), bleeding (1 in 200), allergic reaction [possibly serious] (1 in 200), and agrees to proceed.  He is currently on Lopressor, I will stop this and start the patient  on low-dose Toprol-XL . He will need to be optimized with guideline directed medications ( entresto/Adactone/faxiga).  He cannot I can facilitate this time given the patient's blood pressure is on the lower side.   For now we will hold off on Entresto and also refer the patient to ou rAdvanced heart failure clinic for his dilated CM and severe pulmonary hypertension.    Hopefully we can get more information of his right heart pressure post his RHC.   There is severe mitral regurgitation which I suspect is secondary MR due to his dilated LV cavity. For now will see how he responds to medical therapy then further  recommendations can be made.   LifeVest has been ordered for the patient along with understanding the etiology of his cardiomyopathy.  He may benefit from EP evaluation as he may be a candidate eventually for ICD placement.  Smoking cessation advised, however the patient notes he is not ready to quit smoking.  HBA1c ordered today due to his hx of prediabetes.   Continue PCSK9 inhibitors - also followed by the lipid clinic.  The patient is in agreement with the above plan. The patient left  the office in stable condition.  The patient will follow up in 2 weeks with Dr. Julianne Rice.   Medication Adjustments/Labs and Tests Ordered: Current medicines are reviewed at length with the patient today.  Concerns regarding medicines are outlined above.  Orders Placed This Encounter  Procedures  . Basic metabolic panel  . CBC with Differential/Platelet  . Magnesium  . Hemoglobin A1c  . AMB referral to CHF clinic  . Ambulatory referral to Cardiac Electrophysiology   Meds ordered this encounter  Medications  . metoprolol succinate (TOPROL XL) 25 MG 24 hr tablet    Sig: Take 0.5 tablets (12.5 mg total) by mouth daily.    Dispense:  45 tablet    Refill:  3  . metoprolol succinate (TOPROL XL) 25 MG 24 hr tablet    Sig: Take 0.5 tablets (12.5 mg total) by mouth daily.    Dispense:  15 tablet    Refill:  1    Patient Instructions  Medication Instructions:  Your physician has recommended you make the following change in your medication:  STOP: Lopressor START: Toprol-XL 12.5 mg once daily *If you need a refill on your cardiac medications before your next appointment, please call your pharmacy*   Lab Work: Your physician recommends that you return for lab work: TODAY: BMET, Mag, CBC, HbA1C If you have labs (blood work) drawn today and your tests are completely normal, you will receive your results only by: Marland Kitchen MyChart Message (if you have MyChart) OR . A paper copy in the mail If you have any  lab test that is abnormal or we need to change your treatment, we will call you to review the results.   Testing/Procedures: Zoll Life Vest. Dorian Pod will contact you [phone].   [facility] [address] [place], [place] [address] Dept: [phone] Loc: [phone]  Tommy Ward [facility]  12/05/2020  You are scheduled for a Cardiac Catheterization on Friday, April 22 with Dr. Kathlyn Sacramento.  1. Please arrive at the [facility] (Main Entrance A) at [facility]: [address] [place], [place] [address] at 7:00 AM (This time is two hours before your procedure to ensure your preparation). Free valet parking service is available.   Special note: Every effort is made to have your procedure done on time. Please understand that emergencies sometimes delay scheduled procedures.  2. Diet: Do not eat solid foods after midnight.  The patient may have clear liquids until 5am upon the day of the procedure.  3. Labs: You will need to have blood drawn on TODAY  4. Medication instructions in preparation for your procedure:   Contrast Allergy: No   On the morning of your procedure, take your Aspirin and any morning medicines NOT listed above.  You may use sips of water.  5. Plan for one night stay--bring personal belongings. 6. Bring a current list of your medications and current insurance cards. 7. You MUST have a responsible person to drive you home. 8. Someone MUST be with you the first 24 hours after you arrive home or your discharge will be delayed. 9. Please wear clothes that are easy to get on and off and wear slip-on shoes.  Thank you for allowing [place] to care for you!   -- [place] [facility]    Follow-Up: At [facility], you and your health needs are our priority.  As part of our continuing mission to provide  you with exceptional heart care, we have created  designated Provider Care Teams.  These Care Teams include your primary Cardiologist (physician) and Advanced Practice Providers (APPs -  Physician Assistants and Nurse Practitioners) who all work together to provide you with the care you need, when you need it.  We recommend signing up for the patient portal called "MyChart".  Sign up information is provided on this After Visit Summary.  MyChart is used to connect with patients for Virtual Visits (Telemedicine).  Patients are able to view lab/test results, encounter notes, upcoming appointments, etc.  Non-urgent messages can be sent to your provider as well.   To learn more about what you can do with MyChart, go to [email or web address].    Your next appointment:   2 week(s)  The format for your next appointment:   In Person  Provider:   Jyl Heinz, MD   Other Instructions     Adopting a Healthy Lifestyle.  Know what a healthy weight is for you (roughly BMI <25) and aim to maintain this   Aim for 7+ servings of fruits and vegetables daily   65-80+ fluid ounces of water or unsweet tea for healthy kidneys   Limit to max 1 drink of alcohol per day; avoid smoking/tobacco   Limit animal fats in diet for cholesterol and heart health - choose grass fed whenever available   Avoid highly processed foods, and foods high in saturated/trans fats   Aim for low stress - take time to unwind and care for your mental health   Aim for 150 min of moderate intensity exercise weekly for heart health, and weights twice weekly for bone health   Aim for 7-9 hours of sleep daily   When it comes to diets, agreement about the perfect plan isnt easy to find, even among the experts. Experts at the [employer] developed an idea known as the Healthy Eating Plate. Just imagine a plate divided into logical, healthy portions.   The emphasis is on diet quality:   Load up on vegetables and fruits - one-half of your plate: Aim for  color and variety, and remember that potatoes dont count.   Go for whole grains - one-quarter of your plate: Whole wheat, barley, wheat berries, quinoa, oats, brown rice, and foods made with them. If you want pasta, go with whole wheat pasta.   Protein power - one-quarter of your plate: Fish, chicken, beans, and nuts are all healthy, versatile protein sources. Limit red meat.   The diet, however, does go beyond the plate, offering a few other suggestions.   Use healthy plant oils, such as olive, canola, soy, corn, sunflower and peanut. Check the labels, and avoid partially hydrogenated oil, which have unhealthy trans fats.   If youre thirsty, drink water. Coffee and tea are good in moderation, but skip sugary drinks and limit milk and dairy products to one or two daily servings.   The type of carbohydrate in the diet is more important than the amount. Some sources of carbohydrates, such as vegetables, fruits, whole grains, and beans-are healthier than others.   Finally, stay active  Signed, Berniece Salines, DO  12/05/2020 6:27 PM    [facility]

## 2020-12-05 NOTE — Progress Notes (Signed)
Cardiology Office Note:    Date:  12/05/2020   ID:  Tommy Ward, DOB 12-27-1940, MRN [id number]  PCP:  Serita Grammes, MD  Cardiologist:  Pixie Casino, MD  Electrophysiologist:  None   Referring MD: Serita Grammes, MD   I am doing okay  History of Present Illness:    Tommy Ward is a 80 y.o. 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  Patient follows with Dr. [name] with his primary cardiologist as well as Dr. Debara Pickett for the lipid clinic.  The patient is here today to discuss his testing results, his primary cardiologist is out of the office.  The patient is here today with his wife. . He does not offer any complaints at this time.  Past Medical History:  Diagnosis Date  . AKI (acute kidney injury) ([place]) 11/25/2019  . 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[place]) 08/24/2019  . Aortic valve disorder 07/08/2014  . B12 deficiency 03/22/2018  . Benign hypertension with chronic kidney disease, stage III ([place]) 05/20/2019  . CAD (coronary artery disease) 11/23/2020  . Cancer ([place]) 08/2008   prostate ca  . 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 Cataract 11/2013   bilateral  . Chronic renal insufficiency, stage III (moderate) ([place]) 11/27/2015  . 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 Coronary arteriosclerosis 07/06/2012  . Decreased cardiac ejection fraction 05/20/2019  . Essential hypertension 07/06/2012  . 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  . Gastroesophageal reflux disease without esophagitis 03/22/2018  . GERD (gastroesophageal reflux disease)   . HFrEF (heart failure with reduced ejection fraction) ([place]) 11/23/2020  . Hyperkalemia 08/24/2019  . Hyperlipidemia   .  Hyperphosphatemia 11/25/2019  . Hypertension   . 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  . Left bundle branch block 07/06/2012  . Malignant neoplasm of prostate ([place])   . Malignant neoplasm of prostate ([place])   . Metabolic bone disease 12/22/2128  . Mixed hyperlipidemia 07/06/2012  . Olecranon bursitis of left elbow 06/25/2018  . S/P radiation therapy > 12 wks ago 2010   prostate CA  . 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[place])   . Vitamin D deficiency 08/24/2019    Past Surgical History:  Procedure Laterality Date  . CHOLECYSTECTOMY  01/2003   lap chole  . left hip repaired      Current Medications: Current Meds  Medication Sig  . aspirin EC 81 MG tablet Take 1 tablet (81 mg total) by mouth daily.  Marland Kitchen b complex vitamins capsule Take 1 capsule by mouth daily.  . calcium-vitamin D (OSCAL WITH D) 500-200 MG-UNIT tablet Take 1 tablet by mouth daily.  . cholecalciferol (VITAMIN D3) 25 MCG (1000 UT) tablet Take 1,000 Units by mouth daily.  . Denosumab (XGEVA [place]) Inject 1 application into the skin every 30 (thirty) days. Strength unknown  . enzalutamide (XTANDI) 40 MG tablet Take 160 mg by mouth daily.  . Evolocumab 140 MG/ML SOAJ Inject 140 mg into the skin every 14 (fourteen) days.  . furosemide (LASIX) 40 MG tablet Take 40 mg by mouth in the morning. And take 20 mg in the evening  . meclizine (ANTIVERT) 25 MG tablet Take 1 tablet by mouth daily.  . metoprolol succinate (TOPROL XL) 25 MG 24  hr tablet Take 0.5 tablets (12.5 mg total) by mouth daily.  . metoprolol succinate (TOPROL XL) 25 MG 24 hr tablet Take 0.5 tablets (12.5 mg total) by mouth daily.  Marland Kitchen omeprazole (PRILOSEC) 40 MG capsule Take 1 capsule by mouth daily.  . pregabalin (LYRICA) 100 MG capsule Take 100 mg by mouth as needed for pain.  . tamsulosin (FLOMAX) 0.4 MG CAPS capsule Take 1 capsule by mouth daily.  . [DISCONTINUED] metoprolol tartrate (LOPRESSOR) 50 MG tablet Take 50 mg  by mouth daily.     Allergies:   Ezetimibe and Statins   Social History   Socioeconomic History  . Marital status: Married    Spouse name: Not on file  . Number of children: Not on file  . Years of education: Not on file  . Highest education level: Not on file  Occupational History  . Not on file  Tobacco Use  . Smoking status: Current Every Day Smoker    Packs/day: 0.75    Years: 50.00    Pack years: 37.50  . Smokeless tobacco: Never Used  Vaping Use  . Vaping Use: Never used  Substance and Sexual Activity  . Alcohol use: Not Currently  . Drug use: Never  . Sexual activity: Not on file  Other Topics Concern  . Not on file  Social History Narrative  . Not on file   Social Determinants of Health   Financial Resource Strain: Not on file  Food Insecurity: Not on file  Transportation Needs: Not on file  Physical Activity: Not on file  Stress: Not on file  Social Connections: Not on file     Family History: The patient's family history includes Heart attack in his brother; Leukemia in his mother; Prostate cancer in his father. There is no history of Colon cancer, Rectal cancer, Stomach cancer, or Esophageal cancer.  ROS:   Review of Systems  Constitution: Negative for decreased appetite, fever and weight gain.  HENT: Negative for congestion, ear discharge, hoarse voice and sore throat.   Eyes: Negative for discharge, redness, vision loss in right eye and visual halos.  Cardiovascular: Negative for chest pain, dyspnea on exertion, leg swelling, orthopnea and palpitations.  Respiratory: Negative for cough, hemoptysis, shortness of breath and snoring.   Endocrine: Negative for heat intolerance and polyphagia.  Hematologic/Lymphatic: Negative for bleeding problem. Does not bruise/bleed easily.  Skin: Negative for flushing, nail changes, rash and suspicious lesions.  Musculoskeletal: Negative for arthritis, joint pain, muscle cramps, myalgias, neck pain and stiffness.   Gastrointestinal: Negative for abdominal pain, bowel incontinence, diarrhea and excessive appetite.  Genitourinary: Negative for decreased libido, genital sores and incomplete emptying.  Neurological: Negative for brief paralysis, focal weakness, headaches and loss of balance.  Psychiatric/Behavioral: Negative for altered mental status, depression and suicidal ideas.  Allergic/Immunologic: Negative for HIV exposure and persistent infections.    EKGs/Labs/Other Studies Reviewed:    The following studies were reviewed today:   EKG:  None today  TTE 12/01/2020 IMPRESSIONS  1. Deterioration of the EF as compared to echo from 2020 (was 40-45% now <20%). Left ventricular ejection fraction, by estimation, is <20%. The left ventricle has severely decreased function. The left ventricle has no regional wall motion abnormalities.  The left ventricular internal cavity size was moderately to severely dilated. There is mild left ventricular hypertrophy. Left ventricular diastolic parameters are consistent with Grade II diastolic dysfunction  (pseudonormalization).  2. Right ventricular systolic function is normal. The right ventricular size is normal. There is  severely elevated pulmonary artery systolic pressure.  3. Left atrial size was severely dilated.  4. The mitral valve is normal in structure. Severe mitral valve regurgitation. No evidence of mitral stenosis. 5. Tricuspid valve regurgitation is moderate to severe.  6. The aortic valve is normal in structure. Aortic valve regurgitation is not visualized. No aortic stenosis is present.  7. The inferior vena cava is normal in size with greater than 50% respiratory variability, suggesting right atrial pressure of 3 mmHg.   FINDINGS  Left Ventricle: Deterioration of the EF as compared to echo from 2020 (was 40-45% now <20%). Left ventricular ejection fraction, by estimation, is <20%. The left ventricle has severely decreased function. The left  ventricle has no regional wall motion abnormalities. The left ventricular internal cavity size was moderately to severely dilated. There is mild left ventricular hypertrophy. Abnormal (paradoxical) septal motion, consistent with left bundle branch block. Left ventricular diastolic parameters  are consistent with Grade II diastolic dysfunction (pseudonormalization).   Right Ventricle: The right ventricular size is normal. No increase in right ventricular wall thickness. Right ventricular systolic function is  normal. There is severely elevated pulmonary artery systolic pressure. The  tricuspid regurgitant velocity is  3.71 m/s, and with an assumed right atrial pressure of 20 mmHg, the  estimated right ventricular systolic pressure is 01.0 mmHg.   Left Atrium: Left atrial size was severely dilated.   Right Atrium: Right atrial size was normal in size.   Pericardium: There is no evidence of pericardial effusion.   Mitral Valve: The mitral valve is normal in structure. Severe mitral valve regurgitation. No evidence of mitral valve stenosis.   Tricuspid Valve: The tricuspid valve is normal in structure. Tricuspid valve regurgitation is moderate to severe. No evidence of tricuspid stenosis.   Aortic Valve: The aortic valve is normal in structure. Aortic valve regurgitation is not visualized. No aortic stenosis is present. Aortic valve mean gradient measures 4.0 mmHg. Aortic valve peak gradient measures  7.3 mmHg. Aortic valve area, by VTI  measures 1.05 cm.   Pulmonic Valve: The pulmonic valve was normal in structure. Pulmonic valve regurgitation is not visualized. No evidence of pulmonic stenosis.   Aorta: The aortic root is normal in size and structure.   Venous: The inferior vena cava is normal in size with greater than 50%  respiratory variability, suggesting right atrial pressure of 3 mmHg.   IAS/Shunts: No atrial level shunt detected by color flow Doppler.     Pharmacologic  nuclear stress 11/2020  The left ventricular ejection fraction is severely decreased (<30%).  Nuclear stress EF: 16%.  Defect 1: There is a large mild reversible defect of severe severity present in the basal anterior, basal anteroseptal, mid anterior, mid anteroseptal, apical anterior, apical septal, apical inferior, apical lateral and apex location.  Defect 2: There is a medium fixed defect of moderate severity present in the basal inferoseptal and basal inferior location with hypokinesis in the basal inferior and inferoseptal.  This is a high risk study.  Findings consistent with prior myocardial infarction with peri-infarct ischemia.   The large mild reversible defect which is a myocardial infarction with peri-infarct ischemia is in the LAD territory.  With worsening ejection fraction compared to prior Myoview in 2020.  Given the significant depressed ejection fraction and ischemia this is a high risk study.  Recommend cardiac catheterization.   Recent Labs: 11/03/2020: ALT 28; Hemoglobin 12.5; Platelets 226 11/23/2020: BUN 25; Creatinine, Ser 1.63; Potassium 4.0; Sodium 146  Recent Lipid  Panel    Component Value Date/Time   CHOL 193 07/07/2020 0000   CHOL 400 (H) 02/25/2019 0905   TRIG 261 (A) 07/07/2020 0000   HDL 52 07/07/2020 0000   HDL 51 02/25/2019 0905   CHOLHDL 7.8 (H) 02/25/2019 0905   LDLCALC 89 07/07/2020 0000   LDLCALC 290 (H) 02/25/2019 0905    Physical Exam:    VS:  BP 100/64   Pulse 63   Ht 5\' 9"  (1.753 m)   Wt 178 lb 1.9 oz (80.8 kg)   SpO2 98%   BMI 26.30 kg/m     Wt Readings from Last 3 Encounters:  12/05/20 178 lb 1.9 oz (80.8 kg)  11/29/20 183 lb (83 kg)  11/23/20 183 lb 3.2 oz (83.1 kg)     GEN: Well nourished, well developed in no acute distress HEENT: Normal NECK: No JVD; No carotid bruits LYMPHATICS: No lymphadenopathy CARDIAC: S1S2 noted,RRR, no murmurs, rubs, gallops RESPIRATORY:  Clear to auscultation without rales, wheezing or  rhonchi  ABDOMEN: Soft, non-tender, non-distended, +bowel sounds, no guarding. EXTREMITIES: No edema, No cyanosis, no clubbing MUSCULOSKELETAL:  No deformity  SKIN: Warm and dry NEUROLOGIC:  Alert and oriented x 3, non-focal PSYCHIATRIC:  Normal affect, good insight  ASSESSMENT:    1. Abnormal stress test   2. Depressed left ventricular ejection fraction   3. Severe mitral regurgitation   4. Pre-diabetes   5. 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[place])   6. Pulmonary hypertension ([place])   7. Nonrheumatic tricuspid valve regurgitation   8. Tobacco use   9. Dilated cardiomyopathy (HCC)    PLAN:    In the setting of high risk normal nuclear stress test with peri-infarct ischemia and his worsening ejection fraction to 20% from 40 to 45% on his recent echocardiogram, I would like to proceed with a right and left heart catheterization in this patient to definitely rule out any ischemic etiology.    I spoke with the patient about his educated him about the heart catheterization, the patient understands that risks include but are not limited to stroke (1 in 1000), death (1 in 1000), kidney failure [usually temporary] (1 in 500), bleeding (1 in 200), allergic reaction [possibly serious] (1 in 200), and agrees to proceed.  He is currently on Lopressor, I will stop this and start the patient  on low-dose Toprol-XL . He will need to be optimized with guideline directed medications ( entresto/Adactone/faxiga).  He cannot I can facilitate this time given the patient's blood pressure is on the lower side.   For now we will hold off on Entresto and also refer the patient to ou rAdvanced heart failure clinic for his dilated CM and severe pulmonary hypertension.    Hopefully we can get more information of his right heart pressure post his RHC.   There is severe mitral regurgitation which I suspect is secondary MR due to his dilated LV cavity. For now will see how he responds to medical therapy then further  recommendations can be made.   LifeVest has been ordered for the patient along with understanding the etiology of his cardiomyopathy.  He may benefit from EP evaluation as he may be a candidate eventually for ICD placement.  Smoking cessation advised, however the patient notes he is not ready to quit smoking.  HBA1c ordered today due to his hx of prediabetes.   Continue PCSK9 inhibitors - also followed by the lipid clinic.  The patient is in agreement with the above plan. The patient left  the office in stable condition.  The patient will follow up in 2 weeks with Dr. Julianne Rice.   Medication Adjustments/Labs and Tests Ordered: Current medicines are reviewed at length with the patient today.  Concerns regarding medicines are outlined above.  Orders Placed This Encounter  Procedures  . Basic metabolic panel  . CBC with Differential/Platelet  . Magnesium  . Hemoglobin A1c  . AMB referral to CHF clinic  . Ambulatory referral to Cardiac Electrophysiology   Meds ordered this encounter  Medications  . metoprolol succinate (TOPROL XL) 25 MG 24 hr tablet    Sig: Take 0.5 tablets (12.5 mg total) by mouth daily.    Dispense:  45 tablet    Refill:  3  . metoprolol succinate (TOPROL XL) 25 MG 24 hr tablet    Sig: Take 0.5 tablets (12.5 mg total) by mouth daily.    Dispense:  15 tablet    Refill:  1    Patient Instructions  Medication Instructions:  Your physician has recommended you make the following change in your medication:  STOP: Lopressor START: Toprol-XL 12.5 mg once daily *If you need a refill on your cardiac medications before your next appointment, please call your pharmacy*   Lab Work: Your physician recommends that you return for lab work: TODAY: BMET, Mag, CBC, HbA1C If you have labs (blood work) drawn today and your tests are completely normal, you will receive your results only by: Marland Kitchen MyChart Message (if you have MyChart) OR . A paper copy in the mail If you have any  lab test that is abnormal or we need to change your treatment, we will call you to review the results.   Testing/Procedures: [facility] Life Vest. [facility]ian [facility] will contact you [phone].   [facility] [address] [place], [place] [address] Dept: [phone] Loc: [phone]  Adhrit Krenz [facility]  12/05/2020  You are scheduled for a Cardiac Catheterization on Friday, April 22 with Dr. Kathlyn [place].  1. Please arrive at the [facility] (Main Entrance A) at [facility]: [address] [place], [place] [address] at 7:00 AM (This time is two hours before your procedure to ensure your preparation). Free valet parking service is available.   Special note: Every effort is made to have your procedure done on time. Please understand that emergencies sometimes delay scheduled procedures.  2. Diet: Do not eat solid foods after midnight.  The patient may have clear liquids until 5am upon the day of the procedure.  3. Labs: You will need to have blood drawn on TODAY  4. Medication instructions in preparation for your procedure:   Contrast Allergy: No   On the morning of your procedure, take your Aspirin and any morning medicines NOT listed above.  You may use sips of water.  5. Plan for one night stay--bring personal belongings. 6. Bring a current list of your medications and current insurance cards. 7. You MUST have a responsible person to drive you home. 8. Someone MUST be with you the first 24 hours after you arrive home or your discharge will be delayed. 9. Please wear clothes that are easy to get on and off and wear slip-on shoes.  Thank you for allowing [place] to care for you!   -- [facility]    Follow-Up: At [facility], you and your health needs are our priority.  As part of our continuing mission to provide  you with exceptional heart care, we have created  designated Provider Care Teams.  These Care Teams include your primary Cardiologist (physician) and Advanced Practice Providers (APPs -  Physician Assistants and Nurse Practitioners) who all work together to provide you with the care you need, when you need it.  We recommend signing up for the patient portal called "MyChart".  Sign up information is provided on this After Visit Summary.  MyChart is used to connect with patients for Virtual Visits (Telemedicine).  Patients are able to view lab/test results, encounter notes, upcoming appointments, etc.  Non-urgent messages can be sent to your provider as well.   To learn more about what you can do with MyChart, go to [email or web address].    Your next appointment:   2 week(s)  The format for your next appointment:   In Person  Provider:   Jyl Heinz, MD   Other Instructions     Adopting a Healthy Lifestyle.  Know what a healthy weight is for you (roughly BMI <25) and aim to maintain this   Aim for 7+ servings of fruits and vegetables daily   65-80+ fluid ounces of water or unsweet tea for healthy kidneys   Limit to max 1 drink of alcohol per day; avoid smoking/tobacco   Limit animal fats in diet for cholesterol and heart health - choose grass fed whenever available   Avoid highly processed foods, and foods high in saturated/trans fats   Aim for low stress - take time to unwind and care for your mental health   Aim for 150 min of moderate intensity exercise weekly for heart health, and weights twice weekly for bone health   Aim for 7-9 hours of sleep daily   When it comes to diets, agreement about the perfect plan isnt easy to find, even among the experts. Experts at the [employer] developed an idea known as the Healthy Eating Plate. Just imagine a plate divided into logical, healthy portions.   The emphasis is on diet quality:   Load up on vegetables and fruits - one-half of your plate: Aim for  color and variety, and remember that potatoes dont count.   Go for whole grains - one-quarter of your plate: Whole wheat, barley, wheat berries, quinoa, oats, brown rice, and foods made with them. If you want pasta, go with whole wheat pasta.   Protein power - one-quarter of your plate: Fish, chicken, beans, and nuts are all healthy, versatile protein sources. Limit red meat.   The diet, however, does go beyond the plate, offering a few other suggestions.   Use healthy plant oils, such as olive, canola, soy, corn, sunflower and peanut. Check the labels, and avoid partially hydrogenated oil, which have unhealthy trans fats.   If youre thirsty, drink water. Coffee and tea are good in moderation, but skip sugary drinks and limit milk and dairy products to one or two daily servings.   The type of carbohydrate in the diet is more important than the amount. Some sources of carbohydrates, such as vegetables, fruits, whole grains, and beans-are healthier than others.   Finally, stay active  Signed, Berniece Salines, DO  12/05/2020 6:27 PM     [facility]

## 2020-12-06 ENCOUNTER — Telehealth (HOSPITAL_COMMUNITY): Payer: Self-pay | Admitting: Internal Medicine

## 2020-12-06 ENCOUNTER — Other Ambulatory Visit (HOSPITAL_COMMUNITY)
Admission: RE | Admit: 2020-12-06 | Discharge: 2020-12-06 | Disposition: A | Discharge status: Home / Self Care | Payer: Medicare Other | Source: Ambulatory Visit | Attending: Cardiovascular Disease | Admitting: Cardiovascular Disease

## 2020-12-06 DIAGNOSIS — Z20822 Contact with and (suspected) exposure to covid-19: Secondary | ICD-10-CM | POA: Diagnosis not present

## 2020-12-06 DIAGNOSIS — Z01812 Encounter for preprocedural laboratory examination: Secondary | ICD-10-CM | POA: Insufficient documentation

## 2020-12-06 LAB — CBC WITH DIFFERENTIAL/PLATELET
Basophils Absolute: 0 10*3/uL (ref 0.0–0.2)
Basos: 1 %
EOS (ABSOLUTE): 0.2 10*3/uL (ref 0.0–0.4)
Eos: 3 %
Hematocrit: 41.7 % (ref 37.5–51.0)
Hemoglobin: 14 g/dL (ref 13.0–17.7)
Immature Grans (Abs): 0 10*3/uL (ref 0.0–0.1)
Immature Granulocytes: 0 %
Lymphocytes Absolute: 2.1 10*3/uL (ref 0.7–3.1)
Lymphs: 31 %
MCH: 29.7 pg (ref 26.6–33.0)
MCHC: 33.6 g/dL (ref 31.5–35.7)
MCV: 89 fL (ref 79–97)
Monocytes Absolute: 0.7 10*3/uL (ref 0.1–0.9)
Monocytes: 11 %
Neutrophils Absolute: 3.6 10*3/uL (ref 1.4–7.0)
Neutrophils: 54 %
Platelets: 277 10*3/uL (ref 150–450)
RBC: 4.71 x10E6/uL (ref 4.14–5.80)
RDW: 15.6 % — ABNORMAL HIGH (ref 11.6–15.4)
WBC: 6.6 10*3/uL (ref 3.4–10.8)

## 2020-12-06 LAB — BASIC METABOLIC PANEL
BUN/Creatinine Ratio: 17 (ref 10–24)
BUN: 31 mg/dL — ABNORMAL HIGH (ref 8–27)
CO2: 25 mmol/L (ref 20–29)
Calcium: 9.4 mg/dL (ref 8.6–10.2)
Chloride: 100 mmol/L (ref 96–106)
Creatinine, Ser: 1.8 mg/dL — ABNORMAL HIGH (ref 0.76–1.27)
Glucose: 89 mg/dL (ref 65–99)
Potassium: 4.3 mmol/L (ref 3.5–5.2)
Sodium: 140 mmol/L (ref 134–144)
eGFR: 38 mL/min/{1.73_m2} — ABNORMAL LOW (ref >59)

## 2020-12-06 LAB — HEMOGLOBIN A1C
Est. average glucose Bld gHb Est-mCnc: 128 mg/dL
Hgb A1c MFr Bld: 6.1 % — ABNORMAL HIGH (ref 4.8–5.6)

## 2020-12-06 LAB — SARS CORONAVIRUS 2 (TAT 6-24 HRS): SARS Coronavirus 2: NEGATIVE

## 2020-12-06 LAB — MAGNESIUM: Magnesium: 2.3 mg/dL (ref 1.6–2.3)

## 2020-12-06 NOTE — Telephone Encounter (Signed)
Pt called to f/u w/referral, please advise 

## 2020-12-07 ENCOUNTER — Telehealth: Payer: Self-pay

## 2020-12-07 NOTE — Telephone Encounter (Signed)
I spoke with the patient earlier and the matter has been addressed. See chart.

## 2020-12-07 NOTE — Telephone Encounter (Signed)
Sent fax to [place] with requested patient information.

## 2020-12-07 NOTE — Telephone Encounter (Signed)
Spoke with patient's wife in regards to getting information over to [facility] to get the patient fitted for a Facilities manager. Information will be sent to [place] today.

## 2020-12-07 NOTE — Telephone Encounter (Signed)
Left message for the pt to call back   Pt contacted pre-catheterization scheduled at [facility] for: 12/08/20 Verified arrival time and place: [facility]) at: 7 :00 am   No solid food after midnight prior to cath, clear liquids until 5 AM day of procedure. CONTRAST ALLERGY: NO  AM meds can be  taken pre-cath with sips of water including: ASA 81 mg Hold lasix 4/21 and 4/22   Confirmed patient has responsible adult to drive home post procedure and be with patient first 24 hours after arriving home:  You are allowed ONE visitor in the waiting room during the time you are at the hospital for your procedure. Both you and your visitor must wear a mask once you enter the hospital.   GFR 38 and EF less than 20%.Marland KitchenMarland KitchenMarland KitchenCath lab advised and pt to arrive at planned time 7 am. Dr. Harriet Masson advised.

## 2020-12-07 NOTE — Telephone Encounter (Signed)
Left another message for the pt to call back for pre procedure.

## 2020-12-08 ENCOUNTER — Encounter (HOSPITAL_COMMUNITY)
Admission: RE | Disposition: A | Discharge status: Home / Self Care | Payer: Self-pay | Source: Home / Self Care | Attending: Cardiovascular Disease

## 2020-12-08 ENCOUNTER — Encounter (HOSPITAL_COMMUNITY): Payer: Self-pay | Admitting: Cardiovascular Disease

## 2020-12-08 ENCOUNTER — Ambulatory Visit (HOSPITAL_COMMUNITY)
Admission: RE | Admit: 2020-12-08 | Discharge: 2020-12-08 | Disposition: A | Discharge status: Home / Self Care | Payer: Medicare Other | Attending: Cardiovascular Disease | Admitting: Cardiovascular Disease

## 2020-12-08 ENCOUNTER — Inpatient Hospital Stay: Payer: Medicare Other

## 2020-12-08 ENCOUNTER — Inpatient Hospital Stay: Payer: Medicare Other | Admitting: Oncology

## 2020-12-08 ENCOUNTER — Other Ambulatory Visit: Payer: Self-pay

## 2020-12-08 DIAGNOSIS — I420 Dilated cardiomyopathy: Secondary | ICD-10-CM | POA: Insufficient documentation

## 2020-12-08 DIAGNOSIS — F17210 Nicotine dependence, cigarettes, uncomplicated: Secondary | ICD-10-CM | POA: Diagnosis not present

## 2020-12-08 DIAGNOSIS — I5022 Chronic systolic (congestive) heart failure: Secondary | ICD-10-CM | POA: Insufficient documentation

## 2020-12-08 DIAGNOSIS — E785 Hyperlipidemia, unspecified: Secondary | ICD-10-CM | POA: Insufficient documentation

## 2020-12-08 DIAGNOSIS — I059 Rheumatic mitral valve disease, unspecified: Secondary | ICD-10-CM | POA: Insufficient documentation

## 2020-12-08 DIAGNOSIS — I2510 Atherosclerotic heart disease of native coronary artery without angina pectoris: Secondary | ICD-10-CM | POA: Insufficient documentation

## 2020-12-08 DIAGNOSIS — I130 Hypertensive heart and chronic kidney disease with heart failure and stage 1 through stage 4 chronic kidney disease, or unspecified chronic kidney disease: Secondary | ICD-10-CM | POA: Diagnosis not present

## 2020-12-08 DIAGNOSIS — I272 Pulmonary hypertension, unspecified: Secondary | ICD-10-CM | POA: Diagnosis not present

## 2020-12-08 DIAGNOSIS — N189 Chronic kidney disease, unspecified: Secondary | ICD-10-CM | POA: Insufficient documentation

## 2020-12-08 DIAGNOSIS — Z79899 Other long term (current) drug therapy: Secondary | ICD-10-CM | POA: Diagnosis not present

## 2020-12-08 DIAGNOSIS — I340 Nonrheumatic mitral (valve) insufficiency: Secondary | ICD-10-CM | POA: Diagnosis not present

## 2020-12-08 DIAGNOSIS — R7303 Prediabetes: Secondary | ICD-10-CM | POA: Insufficient documentation

## 2020-12-08 DIAGNOSIS — Z7982 Long term (current) use of aspirin: Secondary | ICD-10-CM | POA: Insufficient documentation

## 2020-12-08 DIAGNOSIS — I5023 Acute on chronic systolic (congestive) heart failure: Secondary | ICD-10-CM

## 2020-12-08 DIAGNOSIS — R9439 Abnormal result of other cardiovascular function study: Secondary | ICD-10-CM

## 2020-12-08 HISTORY — PX: RIGHT/LEFT HEART CATH AND CORONARY ANGIOGRAPHY: CATH118266

## 2020-12-08 LAB — POCT I-STAT 7, (LYTES, BLD GAS, ICA,H+H)
Acid-Base Excess: 4 mmol/L — ABNORMAL HIGH (ref 0.0–2.0)
Bicarbonate: 31.4 mmol/L — ABNORMAL HIGH (ref 20.0–28.0)
Calcium, Ion: 1.23 mmol/L (ref 1.15–1.40)
HCT: 41 % (ref 39.0–52.0)
Hemoglobin: 13.9 g/dL (ref 13.0–17.0)
O2 Saturation: 100 %
Potassium: 3.8 mmol/L (ref 3.5–5.1)
Sodium: 139 mmol/L (ref 135–145)
TCO2: 33 mmol/L — ABNORMAL HIGH (ref 22–32)
pCO2 arterial: 57.5 mmHg — ABNORMAL HIGH (ref 32.0–48.0)
pH, Arterial: 7.345 — ABNORMAL LOW (ref 7.350–7.450)
pO2, Arterial: 187 mmHg — ABNORMAL HIGH (ref 83.0–108.0)

## 2020-12-08 LAB — POCT I-STAT EG7
Acid-Base Excess: 4 mmol/L — ABNORMAL HIGH (ref 0.0–2.0)
Bicarbonate: 31 mmol/L — ABNORMAL HIGH (ref 20.0–28.0)
Calcium, Ion: 1.22 mmol/L (ref 1.15–1.40)
HCT: 41 % (ref 39.0–52.0)
Hemoglobin: 13.9 g/dL (ref 13.0–17.0)
O2 Saturation: 70 %
Potassium: 3.7 mmol/L (ref 3.5–5.1)
Sodium: 139 mmol/L (ref 135–145)
TCO2: 33 mmol/L — ABNORMAL HIGH (ref 22–32)
pCO2, Ven: 57.9 mmHg (ref 44.0–60.0)
pH, Ven: 7.337 (ref 7.250–7.430)
pO2, Ven: 40 mmHg (ref 32.0–45.0)

## 2020-12-08 SURGERY — RIGHT/LEFT HEART CATH AND CORONARY ANGIOGRAPHY
Anesthesia: LOCAL

## 2020-12-08 MED ORDER — VERAPAMIL HCL 2.5 MG/ML IV SOLN
INTRAVENOUS | Status: AC
  Filled 2020-12-08: qty 2

## 2020-12-08 MED ORDER — SODIUM CHLORIDE 0.9 % IV SOLN: 250.0000 mL | INTRAVENOUS | Status: DC | PRN

## 2020-12-08 MED ORDER — SODIUM CHLORIDE 0.9% FLUSH: 3.0000 mL | INTRAVENOUS | Status: DC | PRN

## 2020-12-08 MED ORDER — HEPARIN SODIUM (PORCINE) 1000 UNIT/ML IJ SOLN
INTRAMUSCULAR | Status: DC | PRN
  Administered 2020-12-08: 4000 [IU] via INTRAVENOUS

## 2020-12-08 MED ORDER — HEPARIN (PORCINE) IN NACL 1000-0.9 UT/500ML-% IV SOLN
INTRAVENOUS | Status: AC
  Filled 2020-12-08: qty 500

## 2020-12-08 MED ORDER — SODIUM CHLORIDE 0.9 % IV SOLN: INTRAVENOUS | Status: DC

## 2020-12-08 MED ORDER — LIDOCAINE HCL (PF) 1 % IJ SOLN
INTRAMUSCULAR | Status: DC | PRN
  Administered 2020-12-08: 2 mL
  Administered 2020-12-08: 1 mL

## 2020-12-08 MED ORDER — FENTANYL CITRATE (PF) 100 MCG/2ML IJ SOLN
INTRAMUSCULAR | Status: DC | PRN
  Administered 2020-12-08: 25 ug via INTRAVENOUS

## 2020-12-08 MED ORDER — ASPIRIN 81 MG PO CHEW
CHEWABLE_TABLET | ORAL | Status: AC
  Filled 2020-12-08: qty 1

## 2020-12-08 MED ORDER — HEPARIN (PORCINE) IN NACL 1000-0.9 UT/500ML-% IV SOLN
INTRAVENOUS | Status: DC | PRN
  Administered 2020-12-08 (×2): 500 mL

## 2020-12-08 MED ORDER — HEPARIN SODIUM (PORCINE) 1000 UNIT/ML IJ SOLN
INTRAMUSCULAR | Status: AC
  Filled 2020-12-08: qty 1

## 2020-12-08 MED ORDER — MIDAZOLAM HCL 2 MG/2ML IJ SOLN
INTRAMUSCULAR | Status: DC | PRN
  Administered 2020-12-08: 1 mg via INTRAVENOUS

## 2020-12-08 MED ORDER — ASPIRIN 81 MG PO CHEW
81.0000 mg | CHEWABLE_TABLET | Freq: Once | ORAL | Status: AC
  Administered 2020-12-08: 81 mg via ORAL

## 2020-12-08 MED ORDER — FENTANYL CITRATE (PF) 100 MCG/2ML IJ SOLN
INTRAMUSCULAR | Status: AC
  Filled 2020-12-08: qty 2

## 2020-12-08 MED ORDER — IOHEXOL 350 MG/ML SOLN
INTRAVENOUS | Status: DC | PRN
  Administered 2020-12-08: 20 mL

## 2020-12-08 MED ORDER — ACETAMINOPHEN 325 MG PO TABS: 650.0000 mg | ORAL_TABLET | ORAL | Status: DC | PRN

## 2020-12-08 MED ORDER — ONDANSETRON HCL 4 MG/2ML IJ SOLN: 4.0000 mg | Freq: Four times a day (QID) | INTRAMUSCULAR | Status: DC | PRN

## 2020-12-08 MED ORDER — MIDAZOLAM HCL 2 MG/2ML IJ SOLN
INTRAMUSCULAR | Status: AC
  Filled 2020-12-08: qty 2

## 2020-12-08 MED ORDER — LIDOCAINE HCL (PF) 1 % IJ SOLN
INTRAMUSCULAR | Status: AC
  Filled 2020-12-08: qty 30

## 2020-12-08 MED ORDER — SODIUM CHLORIDE 0.9% FLUSH: 3.0000 mL | Freq: Two times a day (BID) | INTRAVENOUS | Status: DC

## 2020-12-08 MED ORDER — VERAPAMIL HCL 2.5 MG/ML IV SOLN
INTRAVENOUS | Status: DC | PRN
  Administered 2020-12-08: 10 mL via INTRA_ARTERIAL

## 2020-12-08 SURGICAL SUPPLY — 13 items
CATH INFINITI 5FR JK (CATHETERS) ×2 IMPLANT
CATH SWAN GANZ 7F STRAIGHT (CATHETERS) ×2 IMPLANT
DEVICE RAD COMP TR BAND LRG (VASCULAR PRODUCTS) ×2 IMPLANT
GLIDESHEATH SLEND SS 6F .021 (SHEATH) ×2 IMPLANT
GLIDESHEATH SLENDER 7FR .021G (SHEATH) ×2 IMPLANT
GUIDEWIRE .025 260CM (WIRE) ×2 IMPLANT
GUIDEWIRE INQWIRE 1.5J.035X260 (WIRE) ×1 IMPLANT
INQWIRE 1.5J .035X260CM (WIRE) ×2
KIT HEART LEFT (KITS) ×2 IMPLANT
PACK CARDIAC CATHETERIZATION (CUSTOM PROCEDURE TRAY) ×2 IMPLANT
SYR MEDRAD MARK 7 150ML (SYRINGE) ×2 IMPLANT
TRANSDUCER W/STOPCOCK (MISCELLANEOUS) ×2 IMPLANT
TUBING CIL FLEX 10 FLL-RA (TUBING) ×2 IMPLANT

## 2020-12-08 NOTE — Progress Notes (Signed)
Discharge instructions reviewed with pt and his wife. Both voice understanding. 

## 2020-12-08 NOTE — Discharge Instructions (Signed)
Radial Site Care  This sheet gives you information about how to care for yourself after your procedure. Your health care provider may also give you more specific instructions. If you have problems or questions, contact your health care provider. What can I expect after the procedure? After the procedure, it is common to have:  Bruising and tenderness at the catheter insertion area. Follow these instructions at home: Medicines  Take over-the-counter and prescription medicines only as told by your health care provider. Insertion site care 1. Follow instructions from your health care provider about how to take care of your insertion site. Make sure you: ? Wash your hands with soap and water before you remove your bandage (dressing). If soap and water are not available, use hand sanitizer. ? May remove dressing in 24 hours. 2. Check your insertion site every day for signs of infection. Check for: ? Redness, swelling, or pain. ? Fluid or blood. ? Pus or a bad smell. ? Warmth. 3. Do no take baths, swim, or use a hot tub for 5 days. 4. You may shower 24-48 hours after the procedure. ? Remove the dressing and gently wash the site with plain soap and water. ? Pat the area dry with a clean towel. ? Do not rub the site. That could cause bleeding. 5. Do not apply powder or lotion to the site. Activity  1. For 24 hours after the procedure, or as directed by your health care provider: ? Do not flex or bend the affected arm. ? Do not push or pull heavy objects with the affected arm. ? Do not drive yourself home from the hospital or clinic. You may drive 24 hours after the procedure. ? Do not operate machinery or power tools. ? KEEP ARM ELEVATED THE REMAINDER OF THE DAY. 2. Do not push, pull or lift anything that is heavier than 10 lb for 5 days. 3. Ask your health care provider when it is okay to: ? Return to work or school. ? Resume usual physical activities or sports. ? Resume sexual  activity. General instructions  If the catheter site starts to bleed, raise your arm and put firm pressure on the site. If the bleeding does not stop, get help right away. This is a medical emergency.  DRINK PLENTY OF FLUIDS FOR THE NEXT 2-3 DAYS.  No alcohol consumption for 24 hours after receiving sedation.  If you went home on the same day as your procedure, a responsible adult should be with you for the first 24 hours after you arrive home.  Keep all follow-up visits as told by your health care provider. This is important. Contact a health care provider if:  You have a fever.  You have redness, swelling, or yellow drainage around your insertion site. Get help right away if:  You have unusual pain at the radial site.  The catheter insertion area swells very fast.  The insertion area is bleeding, and the bleeding does not stop when you hold steady pressure on the area.  Your arm or hand becomes pale, cool, tingly, or numb. These symptoms may represent a serious problem that is an emergency. Do not wait to see if the symptoms will go away. Get medical help right away. Call your local emergency services (911 in the [place] Do not drive yourself to the hospital. Summary  After the procedure, it is common to have bruising and tenderness at the site.  Follow instructions from your health care provider about how to take care   of your radial site wound. Check the wound every day for signs of infection.  This information is not intended to replace advice given to you by your health care provider. Make sure you discuss any questions you have with your health care provider. Document Revised: 09/10/2017 Document Reviewed: 09/10/2017 Elsevier Patient Education  2020 [employer] 

## 2020-12-08 NOTE — Interval H&P Note (Signed)
History and Physical Interval Note:  12/08/2020 9:17 AM  Tommy Ward  has presented today for surgery, with the diagnosis of abnormal stress test.  The various methods of treatment have been discussed with the patient and family. After consideration of risks, benefits and other options for treatment, the patient has consented to  Procedure(s): RIGHT/LEFT HEART CATH AND CORONARY ANGIOGRAPHY (N/A) as a surgical intervention.  The patient's history has been reviewed, patient examined, no change in status, stable for surgery.  I have reviewed the patient's chart and labs.  Questions were answered to the patient's satisfaction.     Kathlyn Sacramento

## 2020-12-11 ENCOUNTER — Inpatient Hospital Stay: Payer: Medicare Other | Attending: Oncology

## 2020-12-11 ENCOUNTER — Inpatient Hospital Stay (INDEPENDENT_AMBULATORY_CARE_PROVIDER_SITE_OTHER): Payer: Medicare Other | Admitting: Oncology

## 2020-12-11 ENCOUNTER — Other Ambulatory Visit: Payer: Self-pay | Admitting: Oncology

## 2020-12-11 ENCOUNTER — Other Ambulatory Visit: Payer: Self-pay

## 2020-12-11 VITALS — BP 107/57 | HR 90 | Temp 98.7°F | Resp 95 | Ht 69.0 in | Wt 179.0 lb

## 2020-12-11 DIAGNOSIS — C61 Malignant neoplasm of prostate: Secondary | ICD-10-CM | POA: Insufficient documentation

## 2020-12-11 DIAGNOSIS — C7951 Secondary malignant neoplasm of bone: Secondary | ICD-10-CM | POA: Insufficient documentation

## 2020-12-11 DIAGNOSIS — I2510 Atherosclerotic heart disease of native coronary artery without angina pectoris: Secondary | ICD-10-CM | POA: Diagnosis not present

## 2020-12-11 NOTE — Progress Notes (Signed)
[facility]  [address] [place],    [address] [phone]  Clinic Day:  12/11/2020  Referring physician: Serita Grammes, MD   HISTORY OF PRESENT ILLNESS:  The patient is a 80 y.o. male with metastatic prostate cancer, who is currently taking Trelstar/enzalutamide for his complete androgen blockade therapy.  He also is taking Xgeva to protect his bones against worsening disease metastasis.  He comes in today for routine followup.  Since his last visit, the patient has been doing very well.  He denies having any significant bone pain or other systemic symptoms which concern him for overt progression of his metastatic prostate cancer.  PHYSICAL EXAM:  Blood pressure (!) 107/57, pulse 90, temperature 98.7 F (37.1 C), temperature source Oral, resp. rate (!) 95, height 5\' 9"  (1.753 m), weight 179 lb (81.2 kg), SpO2 95 %. Wt Readings from Last 3 Encounters:  12/14/20 179 lb 1.3 oz (81.2 kg)  12/11/20 179 lb (81.2 kg)  12/08/20 180 lb (81.6 kg)   Body mass index is 26.43 kg/m. Performance status (ECOG): 1 - Symptomatic but completely ambulatory Physical Exam Constitutional:      Appearance: Normal appearance. He is not ill-appearing.  HENT:     Mouth/Throat:     Mouth: Mucous membranes are moist.     Pharynx: Oropharynx is clear. No oropharyngeal exudate or posterior oropharyngeal erythema.  Cardiovascular:     Rate and Rhythm: Normal rate and regular rhythm.     Heart sounds: No murmur heard. No friction rub. No gallop.   Pulmonary:     Effort: Pulmonary effort is normal. No respiratory distress.     Breath sounds: Normal breath sounds. No wheezing, rhonchi or rales.  Chest:  Breasts:     Right: No axillary adenopathy or supraclavicular adenopathy.     Left: No axillary adenopathy or supraclavicular adenopathy.    Abdominal:     General: Bowel sounds are normal. There is no distension.     Palpations: Abdomen is soft. There is no  mass.     Tenderness: There is no abdominal tenderness.  Musculoskeletal:        General: No swelling.     Right lower leg: No edema.     Left lower leg: No edema.  Lymphadenopathy:     Cervical: No cervical adenopathy.     Upper Body:     Right upper body: No supraclavicular or axillary adenopathy.     Left upper body: No supraclavicular or axillary adenopathy.     Lower Body: No right inguinal adenopathy. No left inguinal adenopathy.  Skin:    General: Skin is warm.     Coloration: Skin is not jaundiced.     Findings: No lesion or rash.  Neurological:     General: No focal deficit present.     Mental Status: He is alert and oriented to person, place, and time. Mental status is at baseline.     Cranial Nerves: Cranial nerves are intact.  Psychiatric:        Mood and Affect: Mood normal.        Behavior: Behavior normal.        Thought Content: Thought content normal.     LABS:   CBC Latest Ref Rng & Units 12/08/2020 12/08/2020 12/05/2020  WBC 3.4 - 10.8 x10E3/uL - - 6.6  Hemoglobin 13.0 - 17.0 g/dL 13.9 13.9 14.0  Hematocrit 39.0 - 52.0 % 41.0 41.0 41.7  Platelets 150 - 450 x10E3/uL - -  277   CMP Latest Ref Rng & Units 12/08/2020 12/08/2020 12/05/2020  Glucose 65 - 99 mg/dL - - 89  BUN 8 - 27 mg/dL - - 31(H)  Creatinine 0.76 - 1.27 mg/dL - - 1.80(H)  Sodium 135 - 145 mmol/L 139 139 140  Potassium 3.5 - 5.1 mmol/L 3.7 3.8 4.3  Chloride 96 - 106 mmol/L - - 100  CO2 20 - 29 mmol/L - - 25  Calcium 8.6 - 10.2 mg/dL - - 9.4  Total Protein 6.3 - 8.2 g/dL - - -  Total Bilirubin 0.3 - 1.2 mg/dL - - -  Alkaline Phos 25 - 125 - - -  AST 14 - 40 - - -  ALT 10 - 40 - - -   Lab Results  Component Value Date   PSA1 <0.1 12/11/2020   ASSESSMENT & PLAN:  Assessment/Plan:  A 80 y.o. male with metastatic prostate cancer.  I am very pleased as his PSA remains at an undetectable level.  This reflects the continued efficacy of his Trelstar/enzalutamide therapy.  He will continue taking  this regimen until there is evidence of disease progression.  He will also continue taking Xgeva monthly for protection against his metastatic bone disease.  Overall, this gentleman continues to do very well.  I will see him back in 3 months for repeat clinical assessment.  The patient understands all the plans discussed today and is in agreement with them.     Quantasia Stegner Macarthur Critchley, MD

## 2020-12-13 LAB — PROSTATE-SPECIFIC AG, SERUM (LABCORP): Prostate Specific Ag, Serum: <0.1 ng/mL (ref 0.0–4.0)

## 2020-12-14 ENCOUNTER — Inpatient Hospital Stay: Payer: Medicare Other

## 2020-12-14 ENCOUNTER — Other Ambulatory Visit: Payer: Self-pay

## 2020-12-14 VITALS — BP 128/60 | HR 87 | Temp 98.2°F | Resp 18 | Ht 69.0 in | Wt 179.1 lb

## 2020-12-14 DIAGNOSIS — C61 Malignant neoplasm of prostate: Secondary | ICD-10-CM

## 2020-12-14 DIAGNOSIS — C7951 Secondary malignant neoplasm of bone: Secondary | ICD-10-CM | POA: Diagnosis not present

## 2020-12-14 MED ORDER — DENOSUMAB 120 MG/1.7ML ~~LOC~~ SOLN
SUBCUTANEOUS | Status: AC
  Filled 2020-12-14: qty 1.7

## 2020-12-14 MED ORDER — DENOSUMAB 120 MG/1.7ML ~~LOC~~ SOLN
120.0000 mg | Freq: Once | SUBCUTANEOUS | Status: AC
  Administered 2020-12-14: 120 mg via SUBCUTANEOUS

## 2020-12-14 NOTE — Progress Notes (Signed)
Pt d/c stable at 1600 

## 2020-12-14 NOTE — Patient Instructions (Signed)
Denosumab injection What is this medicine? DENOSUMAB (den oh sue mab) slows bone breakdown. Prolia is used to treat osteoporosis in women after menopause and in men, and in people who are taking corticosteroids for 6 months or more. Xgeva is used to treat a high calcium level due to cancer and to prevent bone fractures and other bone problems caused by multiple myeloma or cancer bone metastases. Xgeva is also used to treat giant cell tumor of the bone. This medicine may be used for other purposes; ask your health care provider or pharmacist if you have questions. COMMON BRAND NAME(S): Prolia, XGEVA What should I tell my health care provider before I take this medicine? They need to know if you have any of these conditions:  dental disease  having surgery or tooth extraction  infection  kidney disease  low levels of calcium or Vitamin D in the blood  malnutrition  on hemodialysis  skin conditions or sensitivity  thyroid or parathyroid disease  an unusual reaction to denosumab, other medicines, foods, dyes, or preservatives  pregnant or trying to get pregnant  breast-feeding How should I use this medicine? This medicine is for injection under the skin. It is given by a health care professional in a hospital or clinic setting. A special MedGuide will be given to you before each treatment. Be sure to read this information carefully each time. For Prolia, talk to your pediatrician regarding the use of this medicine in children. Special care may be needed. For Xgeva, talk to your pediatrician regarding the use of this medicine in children. While this drug may be prescribed for children as young as 13 years for selected conditions, precautions do apply. Overdosage: If you think you have taken too much of this medicine contact a poison control center or emergency room at once. NOTE: This medicine is only for you. Do not share this medicine with others. What if I miss a dose? It is  important not to miss your dose. Call your doctor or health care professional if you are unable to keep an appointment. What may interact with this medicine? Do not take this medicine with any of the following medications:  other medicines containing denosumab This medicine may also interact with the following medications:  medicines that lower your chance of fighting infection  steroid medicines like prednisone or cortisone This list may not describe all possible interactions. Give your health care provider a list of all the medicines, herbs, non-prescription drugs, or dietary supplements you use. Also tell them if you smoke, drink alcohol, or use illegal drugs. Some items may interact with your medicine. What should I watch for while using this medicine? Visit your doctor or health care professional for regular checks on your progress. Your doctor or health care professional may order blood tests and other tests to see how you are doing. Call your doctor or health care professional for advice if you get a fever, chills or sore throat, or other symptoms of a cold or flu. Do not treat yourself. This drug may decrease your body's ability to fight infection. Try to avoid being around people who are sick. You should make sure you get enough calcium and vitamin D while you are taking this medicine, unless your doctor tells you not to. Discuss the foods you eat and the vitamins you take with your health care professional. See your dentist regularly. Brush and floss your teeth as directed. Before you have any dental work done, tell your dentist you are   receiving this medicine. Do not become pregnant while taking this medicine or for 5 months after stopping it. Talk with your doctor or health care professional about your birth control options while taking this medicine. Women should inform their doctor if they wish to become pregnant or think they might be pregnant. There is a potential for serious side  effects to an unborn child. Talk to your health care professional or pharmacist for more information. What side effects may I notice from receiving this medicine? Side effects that you should report to your doctor or health care professional as soon as possible:  allergic reactions like skin rash, itching or hives, swelling of the face, lips, or tongue  bone pain  breathing problems  dizziness  jaw pain, especially after dental work  redness, blistering, peeling of the skin  signs and symptoms of infection like fever or chills; cough; sore throat; pain or trouble passing urine  signs of low calcium like fast heartbeat, muscle cramps or muscle pain; pain, tingling, numbness in the hands or feet; seizures  unusual bleeding or bruising  unusually weak or tired Side effects that usually do not require medical attention (report to your doctor or health care professional if they continue or are bothersome):  constipation  diarrhea  headache  joint pain  loss of appetite  muscle pain  runny nose  tiredness  upset stomach This list may not describe all possible side effects. Call your doctor for medical advice about side effects. You may report side effects to FDA at 1-800-FDA-1088. Where should I keep my medicine? This medicine is only given in a clinic, doctor's office, or other health care setting and will not be stored at home. NOTE: This sheet is a summary. It may not cover all possible information. If you have questions about this medicine, talk to your doctor, pharmacist, or health care provider.  2021 Elsevier/Gold Standard (2017-12-12 16:10:44)

## 2020-12-19 ENCOUNTER — Telehealth: Payer: Self-pay

## 2020-12-19 NOTE — Telephone Encounter (Signed)
Spoke with patient regarding results and recommendation.  Patient verbalizes understanding and is agreeable to plan of care. Advised patient to call back with any issues or concerns.  

## 2020-12-19 NOTE — Telephone Encounter (Signed)
-----   Message from Jenean Lindau, MD sent at 12/19/2020  3:57 AM EDT ----- I reviewed the notes.  Patient already has had a heart cath.  I would like to see him in follow-up in the next month or so to see how he is doing.  Jenean Lindau, MD 12/19/2020 3:56 AM

## 2020-12-26 MED ORDER — METOPROLOL SUCCINATE ER 25 MG PO TB24: 12.5000 mg | ORAL_TABLET | Freq: Every day | ORAL | 3 refills | Status: DC

## 2020-12-28 ENCOUNTER — Institutional Professional Consult (permissible substitution): Payer: Medicare Other | Admitting: Cardiology

## 2020-12-28 ENCOUNTER — Encounter (HOSPITAL_COMMUNITY): Payer: Self-pay

## 2021-01-01 ENCOUNTER — Other Ambulatory Visit: Payer: Self-pay

## 2021-01-01 ENCOUNTER — Ambulatory Visit: Payer: Medicare Other | Admitting: Cardiology

## 2021-01-02 ENCOUNTER — Telehealth: Payer: Self-pay

## 2021-01-02 ENCOUNTER — Other Ambulatory Visit: Payer: Self-pay

## 2021-01-02 ENCOUNTER — Ambulatory Visit (INDEPENDENT_AMBULATORY_CARE_PROVIDER_SITE_OTHER): Payer: Medicare Other | Admitting: Cardiology

## 2021-01-02 ENCOUNTER — Encounter: Payer: Self-pay | Admitting: Cardiology

## 2021-01-02 VITALS — BP 132/72 | HR 82 | Ht 69.0 in | Wt 182.2 lb

## 2021-01-02 DIAGNOSIS — I10 Essential (primary) hypertension: Secondary | ICD-10-CM

## 2021-01-02 DIAGNOSIS — E7849 Other hyperlipidemia: Secondary | ICD-10-CM

## 2021-01-02 DIAGNOSIS — I420 Dilated cardiomyopathy: Secondary | ICD-10-CM

## 2021-01-02 DIAGNOSIS — I5020 Unspecified systolic (congestive) heart failure: Secondary | ICD-10-CM

## 2021-01-02 DIAGNOSIS — I129 Hypertensive chronic kidney disease with stage 1 through stage 4 chronic kidney disease, or unspecified chronic kidney disease: Secondary | ICD-10-CM

## 2021-01-02 DIAGNOSIS — I2510 Atherosclerotic heart disease of native coronary artery without angina pectoris: Secondary | ICD-10-CM

## 2021-01-02 DIAGNOSIS — N183 Chronic kidney disease, stage 3 unspecified: Secondary | ICD-10-CM

## 2021-01-02 LAB — BASIC METABOLIC PANEL
BUN/Creatinine Ratio: 23 (ref 10–24)
BUN: 40 mg/dL — ABNORMAL HIGH (ref 8–27)
CO2: 22 mmol/L (ref 20–29)
Calcium: 9.6 mg/dL (ref 8.6–10.2)
Chloride: 100 mmol/L (ref 96–106)
Creatinine, Ser: 1.75 mg/dL — ABNORMAL HIGH (ref 0.76–1.27)
Glucose: 98 mg/dL (ref 65–99)
Potassium: 4.2 mmol/L (ref 3.5–5.2)
Sodium: 139 mmol/L (ref 134–144)
eGFR: 39 mL/min/{1.73_m2} — ABNORMAL LOW (ref >59)

## 2021-01-02 MED ORDER — RANOLAZINE ER 500 MG PO TB12: 500.0000 mg | ORAL_TABLET | Freq: Two times a day (BID) | ORAL | 6 refills | Status: DC

## 2021-01-02 MED ORDER — DAPAGLIFLOZIN PROPANEDIOL 5 MG PO TABS: 5.0000 mg | ORAL_TABLET | Freq: Every day | ORAL | 6 refills | Status: DC

## 2021-01-02 NOTE — Telephone Encounter (Signed)
PA started on CMM for Farxiga 5 mg. Key [id number]

## 2021-01-02 NOTE — Progress Notes (Signed)
Cardiology Office Note:    Date:  01/02/2021   ID:  Tommy Ward, DOB 03-14-1941, MRN [id number]  PCP:  Serita Grammes, MD  Cardiologist:  Jenean Lindau, MD   Referring MD: Serita Grammes, MD    ASSESSMENT:    1. Essential hypertension   2. Familial hyperlipidemia   3. Benign hypertension with chronic kidney disease, stage III ([place])   4. Dilated cardiomyopathy ([place])   5. Coronary artery disease involving native coronary artery of native heart without angina pectoris    PLAN:    In order of problems listed above:  1. Coronary artery disease and cardiomyopathy: Secondary prevention stressed with the patient.  Importance of compliance with diet medication stressed any vocalized understanding.  Patient is overall asymptomatic with activities of daily living.  I will initiate him on Farxiga.  He will start taking this medication for a week and then I will start him on Ranexa 500 mg twice daily.  He will be seen in follow-up appointment in a month I told him to be active as tolerated.  Echocardiogram will be done in 60 days from now to reassess his ejection fraction.  This will also help me assess his mitral regurgitation. 2. Mitral regurgitation: Stable at this time.  Medical management and echocardiogram follow-up as mentioned above 3. Renal insufficiency: Stable.  We will do a Chem-7 today.  For this reason he is not on medication such as Entresto. 4. Mixed dyslipidemia: Diet was emphasized.  Lipids were reviewed. 5. Patient will be seen in follow-up appointment in 1 months or earlier if the patient has any concerns.  He also has an appointment with our congestive heart failure clinic and we expect that he will benefit from it in terms of CHF and mitral regurgitation management.  Patient and wife had multiple questions which were answered to their satisfaction.    Medication Adjustments/Labs and Tests Ordered: Current medicines are reviewed at length with the patient today.   Concerns regarding medicines are outlined above.  No orders of the defined types were placed in this encounter.  No orders of the defined types were placed in this encounter.    No chief complaint on file.    History of Present Illness:    Tommy Ward is a 80 y.o. male.  Patient has past medical history of coronary artery disease, ischemic cardiomyopathy, essential hypertension, dyslipidemia.  Patient has renal insufficiency.  Patient underwent evaluation which revealed significantly depressed ejection fraction which was new.  For this patient received coronary angiography and medical management was advised.  Patient also has mitral regurgitation.  At the time of my evaluation is alert awake oriented and in no distress.  He is asymptomatic.  He takes care of activities of daily living.  He cannot hold blood because of orthopedic issues.  He is in follow-up with the congestive heart failure clinic.  At the time of my evaluation, the patient is alert awake oriented and in no distress.  Past Medical History:  Diagnosis Date  . Abnormal stress test 12/05/2020  . AKI (acute kidney injury) ([place]) 11/25/2019  . 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 Aortic valve disorder 07/08/2014  . B12 deficiency 03/22/2018  . Benign hypertension with chronic kidney disease, stage III ([place]) 05/20/2019  . CAD (coronary artery disease) 11/23/2020  . Cancer ([place]) 08/2008   prostate ca  . 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 Cataract 11/2013   bilateral  . Chronic renal  insufficiency, stage III (moderate) ([place]) 11/27/2015  . Chronic systolic heart failure ([place])   . 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 Coronary arteriosclerosis 07/06/2012  . Decreased cardiac ejection fraction 05/20/2019  . Depressed left ventricular ejection fraction 12/05/2020  . Dilated cardiomyopathy ([place]) 12/05/2020  . Essential hypertension 07/06/2012  . 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  .  Gastroesophageal reflux disease without esophagitis 03/22/2018  . GERD (gastroesophageal reflux disease)   . HFrEF (heart failure with reduced ejection fraction) ([place]) 11/23/2020  . Hyperkalemia 08/24/2019  . Hyperlipidemia   . Hyperphosphatemia 11/25/2019  . Hypertension   . 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  . Left bundle branch block 07/06/2012  . Malignant neoplasm of prostate ([place])   . Malignant neoplasm of prostate ([place])   . Metabolic bone disease 03/27/8915  . Mixed hyperlipidemia 07/06/2012  . Nonrheumatic mitral valve regurgitation 12/05/2020  . 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  . Olecranon bursitis of left elbow 06/25/2018  . Pre-diabetes 12/05/2020  . Pulmonary hypertension ([place]) 12/05/2020  . S/P radiation therapy > 12 wks ago 2010   prostate CA  . 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[place])   . 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[place]) 12/05/2020  . Tobacco use 12/05/2020  . Vitamin D deficiency 08/24/2019    Past Surgical History:  Procedure Laterality Date  . CHOLECYSTECTOMY  01/2003   lap chole  . left hip repaired    . RIGHT/LEFT HEART CATH AND CORONARY ANGIOGRAPHY N/A 12/08/2020   Procedure: RIGHT/LEFT HEART CATH AND CORONARY ANGIOGRAPHY;  Surgeon: Wellington Hampshire, MD;  Location: [facility];  Service: Cardiovascular;  Laterality: N/A;    Current Medications: Current Meds  Medication Sig  . aspirin EC 81 MG tablet Take 1 tablet (81 mg total) by mouth daily.  Marland Kitchen b complex vitamins capsule Take 1 capsule by mouth daily.  . calcium-vitamin D (OSCAL WITH D) 500-200 MG-UNIT tablet Take 1 tablet by mouth daily.  . cholecalciferol (VITAMIN D3) 25 MCG (1000 UT) tablet Take 1,000 Units by mouth daily.  . cholestyramine light (PREVALITE) 4 GM/DOSE powder Take 4 g by mouth daily.  Marland Kitchen denosumab (XGEVA) 120 MG/1.7ML SOLN injection Inject 1 application into the skin every 30 (thirty) days. Strength unknown  .  enzalutamide (XTANDI) 40 MG tablet Take 160 mg by mouth daily.  . Evolocumab 140 MG/ML SOAJ Inject 140 mg into the skin every 14 (fourteen) days.  . furosemide (LASIX) 40 MG tablet Take 40 mg by mouth in the morning.  . meclizine (ANTIVERT) 25 MG tablet Take 25 mg by mouth daily.  . metoprolol succinate (TOPROL XL) 25 MG 24 hr tablet Take 0.5 tablets (12.5 mg total) by mouth daily.  Marland Kitchen omeprazole (PRILOSEC) 40 MG capsule Take 40 mg by mouth daily.  . pregabalin (LYRICA) 100 MG capsule Take 100 mg by mouth 2 (two) times daily as needed for pain.  . tamsulosin (FLOMAX) 0.4 MG CAPS capsule Take 0.4 mg by mouth daily.     Allergies:   Ezetimibe and Statins   Social History   Socioeconomic History  . Marital status: Married    Spouse name: Not on file  . Number of children: Not on file  . Years of education: Not on file  . Highest education level: Not on file  Occupational History  . Not on file  Tobacco Use  . Smoking status: Current Every Day Smoker    Packs/day: 0.75    Years: 50.00  Pack years: 37.50  . Smokeless tobacco: Never Used  Vaping Use  . Vaping Use: Never used  Substance and Sexual Activity  . Alcohol use: Not Currently  . Drug use: Never  . Sexual activity: Not on file  Other Topics Concern  . Not on file  Social History Narrative  . Not on file   Social Determinants of Health   Financial Resource Strain: Not on file  Food Insecurity: Not on file  Transportation Needs: Not on file  Physical Activity: Not on file  Stress: Not on file  Social Connections: Not on file     Family History: The patient's family history includes Heart attack in his brother; Leukemia in his mother; Prostate cancer in his father. There is no history of Colon cancer, Rectal cancer, Stomach cancer, or Esophageal cancer.  ROS:   Please see the history of present illness.    All other systems reviewed and are negative.  EKGs/Labs/Other Studies Reviewed:    The following studies  were reviewed today: RIGHT/LEFT HEART CATH AND CORONARY ANGIOGRAPHY    Conclusion    Prox RCA lesion is 80% stenosed.  Mid LAD lesion is 100% stenosed.  1st Diag lesion is 60% stenosed.   1.  Left dominant coronary arteries with chronically occluded mid LAD after the origin of a large diagonal branch with faint right to left collaterals.  There is 80% stenosis in the proximal nondominant right coronary artery.  The left circumflex is very large in size and free of significant disease. 2.  Left ventricular angiography was not performed due to chronic kidney disease.  EF was severely reduced by echo. 3.  Right heart catheterization showed mildly elevated left-sided filling pressures, mild pulmonary hypertension and moderately reduced cardiac output.  No giant V waves noted on PCW wave forms.  Recommendations: Recommend medical therapy for ischemic cardiomyopathy.  The LAD is occluded with faint collaterals.  Suspect that that area is likely nonviable.  The RCA is nondominant.  Although PCI of the RCA might improve the collaterals to the LAD, I doubt that it will make any significant difference in myocardial function given that the LAD area is likely already infarcted. Optimize medical therapy and reevaluate ejection fraction and degree of mitral regurgitation to see if the patient is a candidate for mitral valve clip.  Only 20 mL of contrast was used for the procedure.  IMPRESSIONS    1. Deterioration of the EF as compared to echo from 2020 (was 40-45% now  <20%). Left ventricular ejection fraction, by estimation, is <20%. The  left ventricle has severely decreased function. The left ventricle has no  regional wall motion abnormalities.  The left ventricular internal cavity size was moderately to severely  dilated. There is mild left ventricular hypertrophy. Left ventricular  diastolic parameters are consistent with Grade II diastolic dysfunction  (pseudonormalization).  2.  Right ventricular systolic function is normal. The right ventricular  size is normal. There is severely elevated pulmonary artery systolic  pressure.  3. Left atrial size was severely dilated.  4. The mitral valve is normal in structure. Severe mitral valve  regurgitation. No evidence of mitral stenosis.  5. Tricuspid valve regurgitation is moderate to severe.  6. The aortic valve is normal in structure. Aortic valve regurgitation is  not visualized. No aortic stenosis is present.  7. The inferior vena cava is normal in size with greater than 50%  respiratory variability, suggesting right atrial pressure of 3 mmHg.     Recent  Labs: 11/03/2020: ALT 28 12/05/2020: BUN 31; Creatinine, Ser 1.80; Magnesium 2.3; Platelets 277 12/08/2020: Hemoglobin 13.9; Potassium 3.7; Sodium 139  Recent Lipid Panel    Component Value Date/Time   CHOL 193 07/07/2020 0000   CHOL 400 (H) 02/25/2019 0905   TRIG 261 (A) 07/07/2020 0000   HDL 52 07/07/2020 0000   HDL 51 02/25/2019 0905   CHOLHDL 7.8 (H) 02/25/2019 0905   LDLCALC 89 07/07/2020 0000   LDLCALC 290 (H) 02/25/2019 0905    Physical Exam:    VS:  BP 132/72   Pulse 82   Ht 5\' 9"  (1.753 m)   Wt 182 lb 3.2 oz (82.6 kg)   SpO2 97%   BMI 26.91 kg/m     Wt Readings from Last 3 Encounters:  01/02/21 182 lb 3.2 oz (82.6 kg)  12/14/20 179 lb 1.3 oz (81.2 kg)  12/11/20 179 lb (81.2 kg)     GEN: Patient is in no acute distress HEENT: Normal NECK: No JVD; No carotid bruits LYMPHATICS: No lymphadenopathy CARDIAC: Hear sounds regular, 2/6 systolic murmur at the apex. RESPIRATORY:  Clear to auscultation without rales, wheezing or rhonchi  ABDOMEN: Soft, non-tender, non-distended MUSCULOSKELETAL:  No edema; No deformity  SKIN: Warm and dry NEUROLOGIC:  Alert and oriented x 3 PSYCHIATRIC:  Normal affect   Signed, Jenean Lindau, MD  01/02/2021 9:39 AM    [facility]

## 2021-01-02 NOTE — Patient Instructions (Signed)
Medication Instructions:  Your physician has recommended you make the following change in your medication:   Start Farxiga 5 mg daily. Start Ranexa 500 mg twice daily 1 week after being on Farxiga.  *If you need a refill on your cardiac medications before your next appointment, please call your pharmacy*   Lab Work: Your physician recommends that you have a BMET today in the office.  If you have labs (blood work) drawn today and your tests are completely normal, you will receive your results only by: Marland Kitchen MyChart Message (if you have MyChart) OR . A paper copy in the mail If you have any lab test that is abnormal or we need to change your treatment, we will call you to review the results.   Testing/Procedures: Your physician has requested that you have an echocardiogram. Echocardiography is a painless test that uses sound waves to create images of your heart. It provides your doctor with information about the size and shape of your heart and how well your heart's chambers and valves are working. This procedure takes approximately one hour. There are no restrictions for this procedure.    Follow-Up: At [facility], you and your health needs are our priority.  As part of our continuing mission to provide you with exceptional heart care, we have created designated Provider Care Teams.  These Care Teams include your primary Cardiologist (physician) and Advanced Practice Providers (APPs -  Physician Assistants and Nurse Practitioners) who all work together to provide you with the care you need, when you need it.  We recommend signing up for the patient portal called "MyChart".  Sign up information is provided on this After Visit Summary.  MyChart is used to connect with patients for Virtual Visits (Telemedicine).  Patients are able to view lab/test results, encounter notes, upcoming appointments, etc.  Non-urgent messages can be sent to your provider as well.   To learn more about what you can  do with MyChart, go to [email or web address].    Your next appointment:   1 month(s)  The format for your next appointment:   In Person  Provider:   Jyl Heinz, MD   Other Instructions Echocardiogram An echocardiogram is a test that uses sound waves (ultrasound) to produce images of the heart. Images from an echocardiogram can provide important information about:  Heart size and shape.  The size and thickness and movement of your heart's walls.  Heart muscle function and strength.  Heart valve function or if you have stenosis. Stenosis is when the heart valves are too narrow.  If blood is flowing backward through the heart valves (regurgitation).  A tumor or infectious growth around the heart valves.  Areas of heart muscle that are not working well because of poor blood flow or injury from a heart attack.  Aneurysm detection. An aneurysm is a weak or damaged part of an artery wall. The wall bulges out from the normal force of blood pumping through the body. Tell a health care provider about:  Any allergies you have.  All medicines you are taking, including vitamins, herbs, eye drops, creams, and over-the-counter medicines.  Any blood disorders you have.  Any surgeries you have had.  Any medical conditions you have.  Whether you are pregnant or may be pregnant. What are the risks? Generally, this is a safe test. However, problems may occur, including an allergic reaction to dye (contrast) that may be used during the test. What happens before the test? No specific preparation is needed.  You may eat and drink normally. What happens during the test?  You will take off your clothes from the waist up and put on a hospital gown.  Electrodes or electrocardiogram (ECG)patches may be placed on your chest. The electrodes or patches are then connected to a device that monitors your heart rate and rhythm.  You will lie down on a table for an ultrasound exam. A gel will  be applied to your chest to help sound waves pass through your skin.  A handheld device, called a transducer, will be pressed against your chest and moved over your heart. The transducer produces sound waves that travel to your heart and bounce back (or "echo" back) to the transducer. These sound waves will be captured in real-time and changed into images of your heart that can be viewed on a video monitor. The images will be recorded on a computer and reviewed by your health care provider.  You may be asked to change positions or hold your breath for a short time. This makes it easier to get different views or better views of your heart.  In some cases, you may receive contrast through an IV in one of your veins. This can improve the quality of the pictures from your heart. The procedure may vary among health care providers and hospitals.   What can I expect after the test? You may return to your normal, everyday life, including diet, activities, and medicines, unless your health care provider tells you not to do that. Follow these instructions at home:  It is up to you to get the results of your test. Ask your health care provider, or the department that is doing the test, when your results will be ready.  Keep all follow-up visits. This is important. Summary  An echocardiogram is a test that uses sound waves (ultrasound) to produce images of the heart.  Images from an echocardiogram can provide important information about the size and shape of your heart, heart muscle function, heart valve function, and other possible heart problems.  You do not need to do anything to prepare before this test. You may eat and drink normally.  After the echocardiogram is completed, you may return to your normal, everyday life, unless your health care provider tells you not to do that. This information is not intended to replace advice given to you by your health care provider. Make sure you discuss any  questions you have with your health care provider. Document Revised: 03/28/2020 Document Reviewed: 03/28/2020 Elsevier Patient Education  2021 [place].  Dapagliflozin tablets What is this medicine? DAPAGLIFLOZIN (DAP a gli FLOE zin) controls blood sugar in people with diabetes. It is used with lifestyle changes like diet and exercise. It also treats heart failure and kidney disease. It may lower the risk for treatment of heart failure in the hospital or worsened kidney disease. This medicine may be used for other purposes; ask your health care provider or pharmacist if you have questions. COMMON BRAND NAME(S): Wilder Glade What should I tell my health care provider before I take this medicine? They need to know if you have any of these conditions:  dehydration  diabetic ketoacidosis  diet low in salt  eating less due to illness, surgery, dieting, or any other reason  having surgery  history of pancreatitis or pancreas problems  history of yeast infection of the penis or vagina  if you often drink alcohol  infection in the bladder, kidneys, or urinary tract  kidney disease  low blood pressure  on dialysis  problems urinating  type 1 diabetes  uncircumcised male  an unusual or allergic reaction to dapagliflozin, other medicines, foods, dyes, or preservatives  pregnant or trying to get pregnant  breast-feeding How should I use this medicine? Take this medicine by mouth with water. Take it as directed on the prescription label at the same time every day. You can take it with or without food. If it upsets your stomach, take it with food. Keep taking it unless your health care provider tells you to stop. A special MedGuide will be given to you by the pharmacist with each prescription and refill. Be sure to read this information carefully each time. Talk to your health care provider about the use of this medicine in children. Special care may be needed. Overdosage: If you  think you have taken too much of this medicine contact a poison control center or emergency room at once. NOTE: This medicine is only for you. Do not share this medicine with others. What if I miss a dose? If you miss a dose, take it as soon as you can. If it is almost time for your next dose, take only that dose. Do not take double or extra doses. What may interact with this medicine? Interactions are not expected. This list may not describe all possible interactions. Give your health care provider a list of all the medicines, herbs, non-prescription drugs, or dietary supplements you use. Also tell them if you smoke, drink alcohol, or use illegal drugs. Some items may interact with your medicine. What should I watch for while using this medicine? Visit your health care provider for regular checks on your progress. Tell your health care provider if your symptoms do not start to get better or if they get worse. This medicine can cause a serious condition in which there is too much acid in the blood. If you develop nausea, vomiting, stomach pain, unusual tiredness, or breathing problems, stop taking this medicine and call your doctor right away. If possible, use a ketone dipstick to check for ketones in your urine. Check with your health care provider if you have severe diarrhea, nausea, and vomiting, or if you sweat a lot. The loss of too much body fluid may make it dangerous for you to take this medicine. A test called the HbA1C (A1C) will be monitored. This is a simple blood test. It measures your blood sugar control over the last 2 to 3 months. You will receive this test every 3 to 6 months. Learn how to check your blood sugar. Learn the symptoms of low and high blood sugar and how to manage them. Always carry a quick-source of sugar with you in case you have symptoms of low blood sugar. Examples include hard sugar candy or glucose tablets. Make sure others know that you can choke if you eat or drink  when you develop serious symptoms of low blood sugar, such as seizures or unconsciousness. Get medical help at once. Tell your health care provider if you have high blood sugar. You might need to change the dose of your medicine. If you are sick or exercising more than usual, you may need to change the dose of your medicine. Do not skip meals. Ask your health care provider if you should avoid alcohol. Many nonprescription cough and cold products contain sugar or alcohol. These can affect blood sugar. Wear a medical ID bracelet or chain. Carry a card that describes your condition. List the  medicines and doses you take on the card. What side effects may I notice from receiving this medicine? Side effects that you should report to your doctor or health care professional as soon as possible:  allergic reactions (skin rash, itching or hives, swelling of the face, lips, or tongue)  breathing problems  dizziness  feeling faint or lightheaded, falls  genital infection (fever; tenderness, redness, or swelling in the genitals or area from the genitals to the back of the rectum)  kidney injury (trouble passing urine or change in the amount of urine)  low blood sugar (feeling anxious; confusion; dizziness; increased hunger; unusually weak or tired; increased sweating; shakiness; cold, clammy skin; irritable; headache; blurred vision; fast heartbeat; loss of consciousness)  muscle weakness  nausea, vomiting, unusual stomach upset or pain  new pain or tenderness, change in skin color, sores or ulcers, or infection in legs or feet  penile discharge, itching, or pain  unusual tiredness  unusual vaginal discharge, itching, or odor  urinary tract infection (fever; chills; a burning feeling when urinating; urgent need to urinate more often; blood in the urine; back pain) Side effects that usually do not require medical attention (report to your doctor or health care professional if they continue or  are bothersome):  mild increase in urination  thirsty This list may not describe all possible side effects. Call your doctor for medical advice about side effects. You may report side effects to FDA at 1-800-FDA-1088. Where should I keep my medicine? Keep out of the reach of children and pets. Store at room temperature between 20 and 25 degrees C (68 and 77 degrees F). Get rid of any unused medicine after the expiration date. To get rid of medicines that are no longer needed or have expired:  Take the medicine to a medicine take-back program. Check with your pharmacy or law enforcement to find a location.  If you cannot return the medicine, check the label or package insert to see if the medicine should be thrown out in the garbage or flushed down the toilet. If you are not sure, ask your health care provider. If it is safe to put it in the trash, take the medicine out of the container. Mix the medicine with cat litter, dirt, coffee grounds, or other unwanted substance. Seal the mixture in a bag or container. Put it in the trash. NOTE: This sheet is a summary. It may not cover all possible information. If you have questions about this medicine, talk to your doctor, pharmacist, or health care provider.  2021 Elsevier/Gold Standard (2020-01-12 13:18:47)   Ranolazine tablets, extended release What is this medicine? RANOLAZINE (ra NOE la zeen) is a heart medicine. It is used to treat chronic chest pain (angina). This medicine must be taken regularly. It will not relieve an acute episode of chest pain. This medicine may be used for other purposes; ask your health care provider or pharmacist if you have questions. COMMON BRAND NAME(S): Ranexa What should I tell my health care provider before I take this medicine? They need to know if you have any of these conditions:  heart disease  irregular heartbeat  kidney disease  liver disease  low levels of potassium or magnesium in the  blood  an unusual or allergic reaction to ranolazine, other medicines, foods, dyes, or preservatives  pregnant or trying to get pregnant  breast-feeding How should I use this medicine? Take this medicine by mouth with a glass of water. Follow the directions on the  prescription label. Do not cut, crush, or chew this medicine. Take with or without food. Do not take this medication with grapefruit juice. Take your doses at regular intervals. Do not take your medicine more often then directed. Talk to your pediatrician regarding the use of this medicine in children. Special care may be needed. Overdosage: If you think you have taken too much of this medicine contact a poison control center or emergency room at once. NOTE: This medicine is only for you. Do not share this medicine with others. What if I miss a dose? If you miss a dose, take it as soon as you can. If it is almost time for your next dose, take only that dose. Do not take double or extra doses. What may interact with this medicine? Do not take this medicine with any of the following medications:  antivirals for HIV or AIDS  cerivastatin  certain antibiotics like chloramphenicol, clarithromycin, dalfopristin; quinupristin, isoniazid, rifabutin, rifampin, rifapentine  certain medicines used for cancer like imatinib, nilotinib  certain medicines for fungal infections like fluconazole, itraconazole, ketoconazole, posaconazole, voriconazole  certain medicines for irregular heart beat like dronedarone  certain medicines for seizures like carbamazepine, fosphenytoin, oxcarbazepine, phenobarbital, phenytoin  cisapride  conivaptan  cyclosporine  grapefruit or grapefruit juice  lumacaftor; ivacaftor  nefazodone  pimozide  quinacrine  St John's wort  thioridazine This medicine may also interact with the following medications:  alfuzosin  certain medicines for depression, anxiety, or psychotic disturbances like  bupropion, citalopram, fluoxetine, fluphenazine, paroxetine, perphenazine, risperidone, sertraline, trifluoperazine  certain medicines for cholesterol like atorvastatin, lovastatin, simvastatin  certain medicines for stomach problems like octreotide, palonosetron, prochlorperazine  eplerenone  ergot alkaloids like dihydroergotamine, ergonovine, ergotamine, methylergonovine  metformin  nicardipine  other medicines that prolong the QT interval (cause an abnormal heart rhythm) like dofetilide, ziprasidone  sirolimus  tacrolimus This list may not describe all possible interactions. Give your health care provider a list of all the medicines, herbs, non-prescription drugs, or dietary supplements you use. Also tell them if you smoke, drink alcohol, or use illegal drugs. Some items may interact with your medicine. What should I watch for while using this medicine? Visit your doctor for regular check ups. Tell your doctor or healthcare professional if your symptoms do not start to get better or if they get worse. This medicine will not relieve an acute attack of angina or chest pain. This medicine can change your heart rhythm. Your health care provider may check your heart rhythm by ordering an electrocardiogram (ECG) while you are taking this medicine. You may get drowsy or dizzy. Do not drive, use machinery, or do anything that needs mental alertness until you know how this medicine affects you. Do not stand or sit up quickly, especially if you are an older patient. This reduces the risk of dizzy or fainting spells. Alcohol may interfere with the effect of this medicine. Avoid alcoholic drinks. If you are scheduled for any medical or dental procedure, tell your healthcare provider that you are taking this medicine. This medicine can interact with other medicines used during surgery. What side effects may I notice from receiving this medicine? Side effects that you should report to your doctor or  health care professional as soon as possible:  allergic reactions like skin rash, itching or hives, swelling of the face, lips, or tongue  breathing problems  changes in vision  fast, irregular or pounding heartbeat  feeling faint or lightheaded, falls  low or high blood pressure  numbness or tingling feelings  ringing in the ears  tremor or shakiness  slow heartbeat (fewer than 50 beats per minute)  swelling of the legs or feet Side effects that usually do not require medical attention (report to your doctor or health care professional if they continue or are bothersome):  constipation  drowsy  dry mouth  headache  nausea or vomiting  stomach upset This list may not describe all possible side effects. Call your doctor for medical advice about side effects. You may report side effects to FDA at 1-800-FDA-1088. Where should I keep my medicine? Keep out of the reach of children. Store at room temperature between 15 and 30 degrees C (59 and 86 degrees F). Throw away any unused medicine after the expiration date. NOTE: This sheet is a summary. It may not cover all possible information. If you have questions about this medicine, talk to your doctor, pharmacist, or health care provider.  2021 Elsevier/Gold Standard (2018-07-28 09:18:49)

## 2021-01-03 ENCOUNTER — Telehealth: Payer: Self-pay | Admitting: Internal Medicine

## 2021-01-03 NOTE — Telephone Encounter (Signed)
Patient's wife called to say that the patient insurance stated that they will not cover omeprazole (PRILOSEC) 40 MG capsule insurance told them they will cover Jardiance. Patient's wife is wondering can they get a prescription for that medication. Please advise

## 2021-01-04 MED ORDER — EMPAGLIFLOZIN 10 MG PO TABS: 10.0000 mg | ORAL_TABLET | Freq: Every day | ORAL | 3 refills | Status: DC

## 2021-01-08 ENCOUNTER — Telehealth: Payer: Self-pay

## 2021-01-08 MED ORDER — ONDANSETRON HCL 4 MG PO TABS: 4.0000 mg | ORAL_TABLET | Freq: Three times a day (TID) | ORAL | 0 refills | Status: DC | PRN

## 2021-01-08 NOTE — Telephone Encounter (Signed)
[name] Pulse aware that zofran has been sent to the pharmacy and it nausea continues to stop the medication and notify us. [name] Pulse verbalized understanding and had no additional questions.

## 2021-01-11 ENCOUNTER — Inpatient Hospital Stay: Payer: Medicare Other | Attending: Oncology

## 2021-01-11 ENCOUNTER — Telehealth: Payer: Self-pay | Admitting: Oncology

## 2021-01-11 ENCOUNTER — Inpatient Hospital Stay: Payer: Medicare Other

## 2021-01-11 ENCOUNTER — Telehealth: Payer: Self-pay | Admitting: Cardiology

## 2021-01-11 ENCOUNTER — Other Ambulatory Visit: Payer: Self-pay | Admitting: Pharmacist

## 2021-01-11 ENCOUNTER — Encounter: Payer: Self-pay | Admitting: Oncology

## 2021-01-11 ENCOUNTER — Encounter: Payer: Self-pay | Admitting: Hematology and Oncology

## 2021-01-11 ENCOUNTER — Other Ambulatory Visit: Payer: Self-pay | Admitting: Hematology and Oncology

## 2021-01-11 ENCOUNTER — Other Ambulatory Visit: Payer: Self-pay

## 2021-01-11 VITALS — BP 109/57 | HR 87 | Temp 98.2°F | Resp 18 | Ht 69.0 in | Wt 187.0 lb

## 2021-01-11 DIAGNOSIS — C7951 Secondary malignant neoplasm of bone: Secondary | ICD-10-CM | POA: Diagnosis not present

## 2021-01-11 DIAGNOSIS — C61 Malignant neoplasm of prostate: Secondary | ICD-10-CM | POA: Diagnosis present

## 2021-01-11 LAB — CBC AND DIFFERENTIAL
HCT: 35 — AB (ref 41–53)
Hemoglobin: 12 — AB (ref 13.5–17.5)
Neutrophils Absolute: 4.16
Platelets: 229 (ref 150–399)
WBC: 6.4

## 2021-01-11 LAB — BASIC METABOLIC PANEL
BUN: 37 — AB (ref 4–21)
CO2: 26 — AB (ref 13–22)
Chloride: 98 — AB (ref 99–108)
Creatinine: 1.7 — AB (ref 0.6–1.3)
Glucose: 127
Potassium: 3.4 (ref 3.4–5.3)
Sodium: 133 — AB (ref 137–147)

## 2021-01-11 LAB — HEPATIC FUNCTION PANEL
ALT: 299 — AB (ref 10–40)
AST: 123 — AB (ref 14–40)
Alkaline Phosphatase: 128 — AB (ref 25–125)
Bilirubin, Total: 1.7

## 2021-01-11 LAB — COMPREHENSIVE METABOLIC PANEL
Albumin: 3.8 (ref 3.5–5.0)
Calcium: 8.5 — AB (ref 8.7–10.7)

## 2021-01-11 LAB — CBC: RBC: 3.91 (ref 3.87–5.11)

## 2021-01-11 MED ORDER — DENOSUMAB 120 MG/1.7ML ~~LOC~~ SOLN
120.0000 mg | Freq: Once | SUBCUTANEOUS | Status: AC
  Administered 2021-01-11: 120 mg via SUBCUTANEOUS

## 2021-01-11 MED ORDER — DENOSUMAB 120 MG/1.7ML ~~LOC~~ SOLN
SUBCUTANEOUS | Status: AC
  Filled 2021-01-11: qty 1.7

## 2021-01-11 NOTE — Telephone Encounter (Signed)
PA for Farxiga denied on CMM.

## 2021-01-11 NOTE — Telephone Encounter (Signed)
Per patient need all his Appt's on the same day due to living over 30 minutes away.  June Appt's are on the same day.  Rescheduled July Labs on the same day as Follow Up, Injection  Gave patient updated Appt Summary

## 2021-01-11 NOTE — Telephone Encounter (Signed)
Spoke with [facility] recommendations reviewed was per Dr. Julien Nordmann note.  Cathy verbalized understanding and had no additional questions.

## 2021-01-11 NOTE — Progress Notes (Signed)
Proceed with denosumab despite elevated LFTs and bilirubin per Dr. Bobby Rumpf.  The patient reports some fatigue, but denies any abdominal pain.  The patient also reports that he has recently started some new heart medications and this may be the cause of the elevation.  I gave the patient a copy of his labwork and recommended that he call his primary care physician as soon as possible to see if further workup is needed.

## 2021-01-11 NOTE — Progress Notes (Signed)
Pt d/c stable at 1524. Tommy Ward dicussed labs with patient after speaking with Dr. Bobby Rumpf advised patient to see his primary care doctor.

## 2021-01-11 NOTE — Patient Instructions (Signed)
Denosumab injection What is this medicine? DENOSUMAB (den oh sue mab) slows bone breakdown. Prolia is used to treat osteoporosis in women after menopause and in men, and in people who are taking corticosteroids for 6 months or more. Xgeva is used to treat a high calcium level due to cancer and to prevent bone fractures and other bone problems caused by multiple myeloma or cancer bone metastases. Xgeva is also used to treat giant cell tumor of the bone. This medicine may be used for other purposes; ask your health care provider or pharmacist if you have questions. COMMON BRAND NAME(S): Prolia, XGEVA What should I tell my health care provider before I take this medicine? They need to know if you have any of these conditions:  dental disease  having surgery or tooth extraction  infection  kidney disease  low levels of calcium or Vitamin D in the blood  malnutrition  on hemodialysis  skin conditions or sensitivity  thyroid or parathyroid disease  an unusual reaction to denosumab, other medicines, foods, dyes, or preservatives  pregnant or trying to get pregnant  breast-feeding How should I use this medicine? This medicine is for injection under the skin. It is given by a health care professional in a hospital or clinic setting. A special MedGuide will be given to you before each treatment. Be sure to read this information carefully each time. For Prolia, talk to your pediatrician regarding the use of this medicine in children. Special care may be needed. For Xgeva, talk to your pediatrician regarding the use of this medicine in children. While this drug may be prescribed for children as young as 13 years for selected conditions, precautions do apply. Overdosage: If you think you have taken too much of this medicine contact a poison control center or emergency room at once. NOTE: This medicine is only for you. Do not share this medicine with others. What if I miss a dose? It is  important not to miss your dose. Call your doctor or health care professional if you are unable to keep an appointment. What may interact with this medicine? Do not take this medicine with any of the following medications:  other medicines containing denosumab This medicine may also interact with the following medications:  medicines that lower your chance of fighting infection  steroid medicines like prednisone or cortisone This list may not describe all possible interactions. Give your health care provider a list of all the medicines, herbs, non-prescription drugs, or dietary supplements you use. Also tell them if you smoke, drink alcohol, or use illegal drugs. Some items may interact with your medicine. What should I watch for while using this medicine? Visit your doctor or health care professional for regular checks on your progress. Your doctor or health care professional may order blood tests and other tests to see how you are doing. Call your doctor or health care professional for advice if you get a fever, chills or sore throat, or other symptoms of a cold or flu. Do not treat yourself. This drug may decrease your body's ability to fight infection. Try to avoid being around people who are sick. You should make sure you get enough calcium and vitamin D while you are taking this medicine, unless your doctor tells you not to. Discuss the foods you eat and the vitamins you take with your health care professional. See your dentist regularly. Brush and floss your teeth as directed. Before you have any dental work done, tell your dentist you are   receiving this medicine. Do not become pregnant while taking this medicine or for 5 months after stopping it. Talk with your doctor or health care professional about your birth control options while taking this medicine. Women should inform their doctor if they wish to become pregnant or think they might be pregnant. There is a potential for serious side  effects to an unborn child. Talk to your health care professional or pharmacist for more information. What side effects may I notice from receiving this medicine? Side effects that you should report to your doctor or health care professional as soon as possible:  allergic reactions like skin rash, itching or hives, swelling of the face, lips, or tongue  bone pain  breathing problems  dizziness  jaw pain, especially after dental work  redness, blistering, peeling of the skin  signs and symptoms of infection like fever or chills; cough; sore throat; pain or trouble passing urine  signs of low calcium like fast heartbeat, muscle cramps or muscle pain; pain, tingling, numbness in the hands or feet; seizures  unusual bleeding or bruising  unusually weak or tired Side effects that usually do not require medical attention (report to your doctor or health care professional if they continue or are bothersome):  constipation  diarrhea  headache  joint pain  loss of appetite  muscle pain  runny nose  tiredness  upset stomach This list may not describe all possible side effects. Call your doctor for medical advice about side effects. You may report side effects to FDA at 1-800-FDA-1088. Where should I keep my medicine? This medicine is only given in a clinic, doctor's office, or other health care setting and will not be stored at home. NOTE: This sheet is a summary. It may not cover all possible information. If you have questions about this medicine, talk to your doctor, pharmacist, or health care provider.  2021 Elsevier/Gold Standard (2017-12-12 16:10:44)

## 2021-01-11 NOTE — Telephone Encounter (Signed)
PTS MOM STATES THAT HER SONS LIVER LEVELS ARE HIGH AND HIS POTASSIUM IS LOW, WOULD LIKE TO SPEAK W/ NURSE FOR RECOMMENDATION

## 2021-01-16 ENCOUNTER — Other Ambulatory Visit: Payer: Self-pay

## 2021-01-16 ENCOUNTER — Ambulatory Visit (HOSPITAL_COMMUNITY)
Admission: RE | Admit: 2021-01-16 | Discharge: 2021-01-16 | Disposition: A | Discharge status: Home / Self Care | Payer: Medicare Other | Source: Ambulatory Visit | Attending: Cardiology | Admitting: Cardiology

## 2021-01-16 ENCOUNTER — Encounter (HOSPITAL_COMMUNITY): Payer: Self-pay | Admitting: Cardiology

## 2021-01-16 ENCOUNTER — Encounter (HOSPITAL_COMMUNITY): Payer: Self-pay

## 2021-01-16 VITALS — BP 102/64 | HR 82 | Wt 190.8 lb

## 2021-01-16 DIAGNOSIS — R7989 Other specified abnormal findings of blood chemistry: Secondary | ICD-10-CM | POA: Insufficient documentation

## 2021-01-16 DIAGNOSIS — E782 Mixed hyperlipidemia: Secondary | ICD-10-CM | POA: Diagnosis not present

## 2021-01-16 DIAGNOSIS — I340 Nonrheumatic mitral (valve) insufficiency: Secondary | ICD-10-CM | POA: Diagnosis not present

## 2021-01-16 DIAGNOSIS — I130 Hypertensive heart and chronic kidney disease with heart failure and stage 1 through stage 4 chronic kidney disease, or unspecified chronic kidney disease: Secondary | ICD-10-CM | POA: Insufficient documentation

## 2021-01-16 DIAGNOSIS — Z79899 Other long term (current) drug therapy: Secondary | ICD-10-CM | POA: Insufficient documentation

## 2021-01-16 DIAGNOSIS — E785 Hyperlipidemia, unspecified: Secondary | ICD-10-CM | POA: Diagnosis not present

## 2021-01-16 DIAGNOSIS — I447 Left bundle-branch block, unspecified: Secondary | ICD-10-CM | POA: Insufficient documentation

## 2021-01-16 DIAGNOSIS — I5020 Unspecified systolic (congestive) heart failure: Secondary | ICD-10-CM

## 2021-01-16 DIAGNOSIS — I2510 Atherosclerotic heart disease of native coronary artery without angina pectoris: Secondary | ICD-10-CM

## 2021-01-16 DIAGNOSIS — F17210 Nicotine dependence, cigarettes, uncomplicated: Secondary | ICD-10-CM | POA: Insufficient documentation

## 2021-01-16 DIAGNOSIS — I491 Atrial premature depolarization: Secondary | ICD-10-CM | POA: Diagnosis not present

## 2021-01-16 DIAGNOSIS — I5022 Chronic systolic (congestive) heart failure: Secondary | ICD-10-CM | POA: Diagnosis not present

## 2021-01-16 DIAGNOSIS — I255 Ischemic cardiomyopathy: Secondary | ICD-10-CM | POA: Diagnosis not present

## 2021-01-16 DIAGNOSIS — N183 Chronic kidney disease, stage 3 unspecified: Secondary | ICD-10-CM | POA: Diagnosis not present

## 2021-01-16 DIAGNOSIS — Z8249 Family history of ischemic heart disease and other diseases of the circulatory system: Secondary | ICD-10-CM | POA: Insufficient documentation

## 2021-01-16 DIAGNOSIS — R0989 Other specified symptoms and signs involving the circulatory and respiratory systems: Secondary | ICD-10-CM | POA: Insufficient documentation

## 2021-01-16 DIAGNOSIS — Z7982 Long term (current) use of aspirin: Secondary | ICD-10-CM | POA: Insufficient documentation

## 2021-01-16 DIAGNOSIS — I272 Pulmonary hypertension, unspecified: Secondary | ICD-10-CM | POA: Diagnosis present

## 2021-01-16 LAB — COMPREHENSIVE METABOLIC PANEL
ALT: 384 U/L — ABNORMAL HIGH (ref 0–44)
AST: 189 U/L — ABNORMAL HIGH (ref 15–41)
Albumin: 3.2 g/dL — ABNORMAL LOW (ref 3.5–5.0)
Alkaline Phosphatase: 190 U/L — ABNORMAL HIGH (ref 38–126)
Anion gap: 10 (ref 5–15)
BUN: 39 mg/dL — ABNORMAL HIGH (ref 8–23)
CO2: 27 mmol/L (ref 22–32)
Calcium: 8.9 mg/dL (ref 8.9–10.3)
Chloride: 98 mmol/L (ref 98–111)
Creatinine, Ser: 2.15 mg/dL — ABNORMAL HIGH (ref 0.61–1.24)
GFR, Estimated: 31 mL/min — ABNORMAL LOW (ref >60)
Glucose, Bld: 117 mg/dL — ABNORMAL HIGH (ref 70–99)
Potassium: 3.5 mmol/L (ref 3.5–5.1)
Sodium: 135 mmol/L (ref 135–145)
Total Bilirubin: 2.2 mg/dL — ABNORMAL HIGH (ref 0.3–1.2)
Total Protein: 5.8 g/dL — ABNORMAL LOW (ref 6.5–8.1)

## 2021-01-16 LAB — CBC
HCT: 37.9 % — ABNORMAL LOW (ref 39.0–52.0)
Hemoglobin: 12 g/dL — ABNORMAL LOW (ref 13.0–17.0)
MCH: 28.5 pg (ref 26.0–34.0)
MCHC: 31.7 g/dL (ref 30.0–36.0)
MCV: 90 fL (ref 80.0–100.0)
Platelets: 340 10*3/uL (ref 150–400)
RBC: 4.21 MIL/uL — ABNORMAL LOW (ref 4.22–5.81)
RDW: 19.5 % — ABNORMAL HIGH (ref 11.5–15.5)
WBC: 7.8 10*3/uL (ref 4.0–10.5)
nRBC: 0.6 % — ABNORMAL HIGH (ref 0.0–0.2)

## 2021-01-16 LAB — LIPID PANEL
Cholesterol: 111 mg/dL (ref 0–200)
HDL: 58 mg/dL (ref >40)
LDL Cholesterol: 38 mg/dL (ref 0–99)
Total CHOL/HDL Ratio: 1.9 RATIO
Triglycerides: 76 mg/dL (ref <150)
VLDL: 15 mg/dL (ref 0–40)

## 2021-01-16 MED ORDER — DIGOXIN 125 MCG PO TABS: 0.0625 mg | ORAL_TABLET | Freq: Every day | ORAL | 3 refills | Status: DC

## 2021-01-16 MED ORDER — SPIRONOLACTONE 25 MG PO TABS: 12.5000 mg | ORAL_TABLET | Freq: Every day | ORAL | 3 refills | Status: DC

## 2021-01-16 MED ORDER — TORSEMIDE 20 MG PO TABS: 20.0000 mg | ORAL_TABLET | Freq: Every day | ORAL | 3 refills | Status: DC

## 2021-01-16 NOTE — Patient Instructions (Addendum)
Labs done today. We will contact you only if your labs are abnormal.  STOP taking Lasix  STOP taking Ranexa   START taking Spironolactone 12.5mg  (1/2 tablet) by mouth daily.  START taking Digoxin 0.0625mg  (1/2 tablet) by mouth daily.   START Torsemide 40mg  (2 tablets) by mouth daily for 4 days THEN DECREASE to 20mg  (1 tablet) by mouth daily.   No other medication changes were made. Please continue all current medications as prescribed.  Your physician recommends that you schedule a follow-up appointment in: 2 weeks  If you have any questions or concerns before your next appointment please send [place] a message through [place] or call our office at [phone].    TO LEAVE A MESSAGE FOR THE NURSE SELECT OPTION 2, PLEASE LEAVE A MESSAGE INCLUDING: . YOUR NAME . DATE OF BIRTH . CALL BACK NUMBER . REASON FOR CALL**this is important as we prioritize the call backs  YOU WILL RECEIVE A CALL BACK THE SAME DAY AS LONG AS YOU CALL BEFORE 4:00 PM   Do the following things EVERYDAY: 1) Weigh yourself in the morning before breakfast. Write it down and keep it in a log. 2) Take your medicines as prescribed 3) Eat low salt foods--Limit salt (sodium) to 2000 mg per day.  4) Stay as active as you can everyday 5) Limit all fluids for the day to less than 2 liters   At the [facility], you and your health needs are our priority. As part of our continuing mission to provide you with exceptional heart care, we have created designated Provider Care Teams. These Care Teams include your primary Cardiologist (physician) and Advanced Practice Providers (APPs- Physician Assistants and Nurse Practitioners) who all work together to provide you with the care you need, when you need it.   You may see any of the following providers on your designated Care Team at your next follow up: Marland Kitchen Dr Glori Bickers . Dr Loralie Champagne . Darrick Grinder, NP . Lyda Jester, PA . Audry Riles,  PharmD   Please be sure to bring in all your medications bottles to every appointment.           TRANSESOPHAGEAL ECHO (TEE)     [place] "A"     [place]     Patient Name: Tommy Ward [facility]                         DATE AND TIME OF [place] June 10th     Please register at the Admitting Department at 6:30am   .   DO NOT EAT OR DRINK ANYTHING after midnight prior to your procedure.     The day of your procedure DO NOT take your Torsemide.   You may take your medications with a sip of water.   You WILL need someone with you to drive you home after the procedure.   If you have any questions, please call our office at [phone] option 2

## 2021-01-17 NOTE — H&P (View-Only) (Signed)
PCP: Serita Grammes, MD Cardiology: Dr. [name] HF Cardiology: Dr. Aundra Dubin  80 y.o. with history of prostate cancer, CKD stage 3, CAD, and ischemic cardiomyopathy was referred by Dr. [name] for evaluation of CHF.  Patient developed gradually worsening exertional dyspnea starting early this year.  The shortness of breath has been especially bad for about 1.5 months.  He had an echo done in 4/22, showing EF < 20%, moderate-severe LV dilation, normal RV, severe MR.  LHC/RHC was done showing low cardiac index at 1.81 and occluded mid LAD.  No intervention.  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   He has never had chest pain.  He is short of breath walking about 100 feet, notes dyspnea when he walks around in his yard.  No problems with dressing or showering and generally gets around the house ok.  He has orthopnea and has raised the head of his bed.  No lightheadedness or falls.  No palpitations.  He abdomen is swollen and tight.  He is wearing a Lifevest.   ECG (personally reviewed): NSR, LBBB 168 msec  Labs (5/22): K 3.4, creatinine 1.7, AST 123, ALT 322, tbili 1.7  PMH:  1. Hyperlipidemia 2. HTN 3. CKD stage 3 4. 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  Treated with radiation and currently on denosumab.   5. Cholecystectomy 6. CAD: LHC in 4/22 with 80% proximal RCA stenosis (nondominant), totally occluded mLAD with collaterals, 60% D1.  No intervention.  7. Chronic systolic CHF: Ischemic cardiomyopathy.   - Echo (4/22) with EF < 20%, moderate-severe LV dilation, normal RV, severe LAE, severe MR.  - RHC (4/22): mean RA 3, PA 39/10, mean PCWP 12, CI 1.81 8. Mitral regurgitation: Severe on 4/22 echo.   Social History   Socioeconomic History  . Marital status: Married    Spouse name: Not on file  . Number of children: Not on file  . Years of education: Not on file  . Highest education level: Not on  file  Occupational History  . Not on file  Tobacco Use  . Smoking status: Current Every Day Smoker    Packs/day: 0.75    Years: 50.00    Pack years: 37.50  . Smokeless tobacco: Never Used  Vaping Use  . Vaping Use: Never used  Substance and Sexual Activity  . Alcohol use: Not Currently  . Drug use: Never  . Sexual activity: Not on file  Other Topics Concern  . Not on file  Social History Narrative  . Not on file   Social Determinants of Health   Financial Resource Strain: Not on file  Food Insecurity: Not on file  Transportation Needs: Not on file  Physical Activity: Not on file  Stress: Not on file  Social Connections: Not on file  Intimate Partner Violence: Not on file   Family History  Problem Relation Age of Onset  . Leukemia Mother   . Prostate cancer Father   . Heart attack Brother   . Colon cancer Neg Hx   . Rectal cancer Neg Hx   . Stomach cancer Neg Hx   . Esophageal cancer Neg Hx    ROS: All systems reviewed and negative except as per HPI.   Current Outpatient Medications  Medication Sig Dispense Refill  . aspirin EC 81 MG tablet Take 1 tablet (81 mg total) by mouth daily. 90 tablet 3  . b complex vitamins capsule Take 1 capsule by mouth daily.    Marland Kitchen  calcium-vitamin D (OSCAL WITH D) 500-200 MG-UNIT tablet Take 1 tablet by mouth daily.    . cholecalciferol (VITAMIN D3) 25 MCG (1000 UT) tablet Take 1,000 Units by mouth daily.    . cholestyramine light (PREVALITE) 4 GM/DOSE powder Take 4 g by mouth daily.    Marland Kitchen denosumab (XGEVA) 120 MG/1.7ML SOLN injection Inject 1 application into the skin every 30 (thirty) days. Strength unknown    . digoxin (LANOXIN) 0.125 MG tablet Take 0.5 tablets (0.0625 mg total) by mouth daily. 45 tablet 3  . enzalutamide (XTANDI) 40 MG tablet Take 160 mg by mouth daily.    . Evolocumab 140 MG/ML SOAJ Inject 140 mg into the skin every 14 (fourteen) days.    . metoprolol succinate (TOPROL XL) 25 MG 24 hr tablet Take 0.5 tablets (12.5  mg total) by mouth daily. 45 tablet 3  . omeprazole (PRILOSEC) 40 MG capsule Take 40 mg by mouth daily.    . ondansetron (ZOFRAN) 4 MG tablet Take 1 tablet (4 mg total) by mouth every 8 (eight) hours as needed for nausea or vomiting. 20 tablet 0  . pregabalin (LYRICA) 100 MG capsule Take 100 mg by mouth 2 (two) times daily as needed for pain.    Marland Kitchen spironolactone (ALDACTONE) 25 MG tablet Take 0.5 tablets (12.5 mg total) by mouth daily. 45 tablet 3  . tamsulosin (FLOMAX) 0.4 MG CAPS capsule Take 0.4 mg by mouth daily.    Marland Kitchen torsemide (DEMADEX) 20 MG tablet Take 1 tablet (20 mg total) by mouth daily. 90 tablet 3   No current facility-administered medications for this encounter.   BP 102/64   Pulse 82   Wt 86.5 kg (190 lb 12.8 oz)   SpO2 97%   BMI 28.18 kg/m  General: NAD Neck: JVP 12 cm, no thyromegaly or thyroid nodule.  Lungs: Clear to auscultation bilaterally with normal respiratory effort. CV: Nondisplaced PMI.  Heart regular S1/S2, no S3/S4, 3/6 HSM apex.  2+ edema to knees.  No carotid bruit.  Normal pedal pulses.  Abdomen: Soft, nontender, no hepatosplenomegaly, no distention.  Skin: Intact without lesions or rashes.  Neurologic: Alert and oriented x 3.  Psych: Normal affect. Extremities: No clubbing or cyanosis.  HEENT: Normal.   Assessment/Plan: 1. CAD: Occluded mid LAD and 80% proximal stenosis in nondominant RCA in 4/22.  No intervention.  No chest pain.  - Continue ASA 81 mg daily.  - Continue Repatha (intolerant of statins and Zetia).  Check lipids today.  - He has never had chest pain.  Think he can stop ranolazine.  2. Chronic systolic CHF: Ischemic cardiomyopathy.  Echo in 4/22 with EF < 20%, moderate-severe LV dilation, normal RV, severe LAE, severe MR.  RHC in 4/22 with CI 1.8.  On exam, patient is volume overloaded. He has NYHA class III symptoms.  BP is on the lower side and may limit titration of GDMT.   He has a wide LBBB.  - Continue Toprol XL 12.5 mg daily.  - He  was unable to take Jardiance due to nausea.  - Start spironolactone 12.5 mg daily.  - Start digoxin 0.0625 mg daily (lower dose with CKD stage III).  - Stop Lasix and start torsemide 40 mg daily x 4 days then 20 mg daily. BMET today and in 10 days.  - Continue Lifevest for now, repeat echo in 7/22.  If EF is < 35%, I think he would be a good CRT-D candidate.  3. Mitral regurgitation: Severe on last  echo, mechanism uncertain.  - I will arrange for TEE after further diuresis.  If MR is indeed severe, he may be a candidate for Mitraclip. We discussed risks/benefits of procedure and he agrees.  4. Elevated LFTs: Noted on recent labs.  Etiology uncertain.  - Check LFTs today.  - He will need abdominal ultrasound to assess liver and hepatitis labs.  5. CKD stage 3: Follow closely with medication titration   Followup with me in 2 wks.   Loralie Champagne 01/17/2021

## 2021-01-17 NOTE — Progress Notes (Signed)
PCP: Serita Grammes, MD Cardiology: Dr. [name] HF Cardiology: Dr. Aundra Dubin  80 y.o. with history of prostate cancer, CKD stage 3, CAD, and ischemic cardiomyopathy was referred by Dr. [name] for evaluation of CHF.  Patient developed gradually worsening exertional dyspnea starting early this year.  The shortness of breath has been especially bad for about 1.5 months.  He had an echo done in 4/22, showing EF < 20%, moderate-severe LV dilation, normal RV, severe MR.  LHC/RHC was done showing low cardiac index at 1.81 and occluded mid LAD.  No intervention.  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   He has never had chest pain.  He is short of breath walking about 100 feet, notes dyspnea when he walks around in his yard.  No problems with dressing or showering and generally gets around the house ok.  He has orthopnea and has raised the head of his bed.  No lightheadedness or falls.  No palpitations.  He abdomen is swollen and tight.  He is wearing a Lifevest.   ECG (personally reviewed): NSR, LBBB 168 msec  Labs (5/22): K 3.4, creatinine 1.7, AST 123, ALT 322, tbili 1.7  PMH:  1. Hyperlipidemia 2. HTN 3. CKD stage 3 4. 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  Treated with radiation and currently on denosumab.   5. Cholecystectomy 6. CAD: LHC in 4/22 with 80% proximal RCA stenosis (nondominant), totally occluded mLAD with collaterals, 60% D1.  No intervention.  7. Chronic systolic CHF: Ischemic cardiomyopathy.   - Echo (4/22) with EF < 20%, moderate-severe LV dilation, normal RV, severe LAE, severe MR.  - RHC (4/22): mean RA 3, PA 39/10, mean PCWP 12, CI 1.81 8. Mitral regurgitation: Severe on 4/22 echo.   Social History   Socioeconomic History  . Marital status: Married    Spouse name: Not on file  . Number of children: Not on file  . Years of education: Not on file  . Highest education level: Not on  file  Occupational History  . Not on file  Tobacco Use  . Smoking status: Current Every Day Smoker    Packs/day: 0.75    Years: 50.00    Pack years: 37.50  . Smokeless tobacco: Never Used  Vaping Use  . Vaping Use: Never used  Substance and Sexual Activity  . Alcohol use: Not Currently  . Drug use: Never  . Sexual activity: Not on file  Other Topics Concern  . Not on file  Social History Narrative  . Not on file   Social Determinants of Health   Financial Resource Strain: Not on file  Food Insecurity: Not on file  Transportation Needs: Not on file  Physical Activity: Not on file  Stress: Not on file  Social Connections: Not on file  Intimate Partner Violence: Not on file   Family History  Problem Relation Age of Onset  . Leukemia Mother   . Prostate cancer Father   . Heart attack Brother   . Colon cancer Neg Hx   . Rectal cancer Neg Hx   . Stomach cancer Neg Hx   . Esophageal cancer Neg Hx    ROS: All systems reviewed and negative except as per HPI.   Current Outpatient Medications  Medication Sig Dispense Refill  . aspirin EC 81 MG tablet Take 1 tablet (81 mg total) by mouth daily. 90 tablet 3  . b complex vitamins capsule Take 1 capsule by mouth daily.    Marland Kitchen  calcium-vitamin D (OSCAL WITH D) 500-200 MG-UNIT tablet Take 1 tablet by mouth daily.    . cholecalciferol (VITAMIN D3) 25 MCG (1000 UT) tablet Take 1,000 Units by mouth daily.    . cholestyramine light (PREVALITE) 4 GM/DOSE powder Take 4 g by mouth daily.    Marland Kitchen denosumab (XGEVA) 120 MG/1.7ML SOLN injection Inject 1 application into the skin every 30 (thirty) days. Strength unknown    . digoxin (LANOXIN) 0.125 MG tablet Take 0.5 tablets (0.0625 mg total) by mouth daily. 45 tablet 3  . enzalutamide (XTANDI) 40 MG tablet Take 160 mg by mouth daily.    . Evolocumab 140 MG/ML SOAJ Inject 140 mg into the skin every 14 (fourteen) days.    . metoprolol succinate (TOPROL XL) 25 MG 24 hr tablet Take 0.5 tablets (12.5  mg total) by mouth daily. 45 tablet 3  . omeprazole (PRILOSEC) 40 MG capsule Take 40 mg by mouth daily.    . ondansetron (ZOFRAN) 4 MG tablet Take 1 tablet (4 mg total) by mouth every 8 (eight) hours as needed for nausea or vomiting. 20 tablet 0  . pregabalin (LYRICA) 100 MG capsule Take 100 mg by mouth 2 (two) times daily as needed for pain.    Marland Kitchen spironolactone (ALDACTONE) 25 MG tablet Take 0.5 tablets (12.5 mg total) by mouth daily. 45 tablet 3  . tamsulosin (FLOMAX) 0.4 MG CAPS capsule Take 0.4 mg by mouth daily.    Marland Kitchen torsemide (DEMADEX) 20 MG tablet Take 1 tablet (20 mg total) by mouth daily. 90 tablet 3   No current facility-administered medications for this encounter.   BP 102/64   Pulse 82   Wt 86.5 kg (190 lb 12.8 oz)   SpO2 97%   BMI 28.18 kg/m  General: NAD Neck: JVP 12 cm, no thyromegaly or thyroid nodule.  Lungs: Clear to auscultation bilaterally with normal respiratory effort. CV: Nondisplaced PMI.  Heart regular S1/S2, no S3/S4, 3/6 HSM apex.  2+ edema to knees.  No carotid bruit.  Normal pedal pulses.  Abdomen: Soft, nontender, no hepatosplenomegaly, no distention.  Skin: Intact without lesions or rashes.  Neurologic: Alert and oriented x 3.  Psych: Normal affect. Extremities: No clubbing or cyanosis.  HEENT: Normal.   Assessment/Plan: 1. CAD: Occluded mid LAD and 80% proximal stenosis in nondominant RCA in 4/22.  No intervention.  No chest pain.  - Continue ASA 81 mg daily.  - Continue Repatha (intolerant of statins and Zetia).  Check lipids today.  - He has never had chest pain.  Think he can stop ranolazine.  2. Chronic systolic CHF: Ischemic cardiomyopathy.  Echo in 4/22 with EF < 20%, moderate-severe LV dilation, normal RV, severe LAE, severe MR.  RHC in 4/22 with CI 1.8.  On exam, patient is volume overloaded. He has NYHA class III symptoms.  BP is on the lower side and may limit titration of GDMT.   He has a wide LBBB.  - Continue Toprol XL 12.5 mg daily.  - He  was unable to take Jardiance due to nausea.  - Start spironolactone 12.5 mg daily.  - Start digoxin 0.0625 mg daily (lower dose with CKD stage III).  - Stop Lasix and start torsemide 40 mg daily x 4 days then 20 mg daily. BMET today and in 10 days.  - Continue Lifevest for now, repeat echo in 7/22.  If EF is < 35%, I think he would be a good CRT-D candidate.  3. Mitral regurgitation: Severe on last  echo, mechanism uncertain.  - I will arrange for TEE after further diuresis.  If MR is indeed severe, he may be a candidate for Mitraclip. We discussed risks/benefits of procedure and he agrees.  4. Elevated LFTs: Noted on recent labs.  Etiology uncertain.  - Check LFTs today.  - He will need abdominal ultrasound to assess liver and hepatitis labs.  5. CKD stage 3: Follow closely with medication titration   Followup with me in 2 wks.   Loralie Champagne 01/17/2021

## 2021-01-19 ENCOUNTER — Other Ambulatory Visit (HOSPITAL_COMMUNITY): Payer: Self-pay | Admitting: *Deleted

## 2021-01-19 MED ORDER — TORSEMIDE 20 MG PO TABS: 20.0000 mg | ORAL_TABLET | Freq: Every day | ORAL | 0 refills | Status: DC

## 2021-01-19 MED ORDER — DIGOXIN 125 MCG PO TABS: 0.0625 mg | ORAL_TABLET | Freq: Every day | ORAL | 0 refills | Status: DC

## 2021-01-19 MED ORDER — SPIRONOLACTONE 25 MG PO TABS: 12.5000 mg | ORAL_TABLET | Freq: Every day | ORAL | 0 refills | Status: DC

## 2021-01-22 ENCOUNTER — Other Ambulatory Visit (HOSPITAL_COMMUNITY): Payer: Self-pay

## 2021-01-22 ENCOUNTER — Encounter: Payer: Self-pay | Admitting: Oncology

## 2021-01-22 ENCOUNTER — Telehealth (HOSPITAL_COMMUNITY): Payer: Self-pay | Admitting: Pharmacy Technician

## 2021-01-22 ENCOUNTER — Telehealth (HOSPITAL_COMMUNITY): Payer: Self-pay

## 2021-01-22 DIAGNOSIS — R748 Abnormal levels of other serum enzymes: Secondary | ICD-10-CM

## 2021-01-22 NOTE — Telephone Encounter (Signed)
Patient Advocate Encounter   Received notification from [place] [facility] that prior authorization for Torsemide is required.   PA submitted on CoverMyMeds Key  [id number] Status is pending   Will continue to follow.

## 2021-01-22 NOTE — Telephone Encounter (Signed)
Spoke with patient and wife. Results reviewed with recommendation for RUQ ultrasound and additional blood work. Verbalized understanding. Will try to coordinate ultrasound and lab work for this week. Pt and wife amenable to plan.

## 2021-01-22 NOTE — Telephone Encounter (Signed)
-----   Message from Larey Dresser, MD sent at 01/18/2021 11:45 PM EDT ----- LFTs higher.  Need RUQ ultrasound to assess liver.  Needs viral hepatitis panel with anti-HAV antibody, anti-HCV antibody, HBsAg, HBsAb.

## 2021-01-23 ENCOUNTER — Ambulatory Visit: Payer: Medicare Other | Admitting: Cardiology

## 2021-01-24 ENCOUNTER — Other Ambulatory Visit: Payer: Self-pay | Admitting: Hematology and Oncology

## 2021-01-24 MED ORDER — PREGABALIN 100 MG PO CAPS: 100.0000 mg | ORAL_CAPSULE | Freq: Two times a day (BID) | ORAL | 3 refills | Status: DC | PRN

## 2021-01-25 ENCOUNTER — Ambulatory Visit (HOSPITAL_COMMUNITY)
Admission: RE | Admit: 2021-01-25 | Discharge: 2021-01-25 | Disposition: A | Discharge status: Home / Self Care | Payer: Medicare Other | Source: Ambulatory Visit | Attending: Cardiology | Admitting: Cardiology

## 2021-01-25 ENCOUNTER — Other Ambulatory Visit: Payer: Self-pay

## 2021-01-25 DIAGNOSIS — R748 Abnormal levels of other serum enzymes: Secondary | ICD-10-CM | POA: Insufficient documentation

## 2021-01-25 LAB — HEPATITIS B SURFACE ANTIGEN: Hepatitis B Surface Ag: NONREACTIVE

## 2021-01-25 LAB — HEPATITIS B SURFACE ANTIBODY,QUALITATIVE: Hep B S Ab: NONREACTIVE

## 2021-01-25 LAB — HEPATITIS C ANTIBODY: HCV Ab: NONREACTIVE

## 2021-01-25 LAB — HEPATITIS A ANTIBODY, IGM: Hep A IgM: NONREACTIVE

## 2021-01-25 NOTE — Anesthesia Preprocedure Evaluation (Addendum)
Anesthesia Evaluation  Patient identified by MRN, date of birth, ID band Patient awake    Reviewed: Allergy & Precautions, NPO status , Patient's Chart, lab work & pertinent test results  History of Anesthesia Complications Negative for: history of anesthetic complications  Airway Mallampati: II  TM Distance: >3 FB Neck ROM: Full    Dental  (+) Teeth Intact   Pulmonary shortness of breath, Current Smoker,  Severe pulmonary HTN   Pulmonary exam normal        Cardiovascular hypertension, Pt. on home beta blockers and Pt. on medications + CAD and +CHF  Normal cardiovascular exam+ Valvular Problems/Murmurs MR   Wears life vest   Neuro/Psych negative neurological ROS     GI/Hepatic GERD  ,(+) Hepatitis - (transaminitis unknown etiology)  Endo/Other  negative endocrine ROS  Renal/GU CRFRenal disease (CKD3; Cr 2.15)  negative genitourinary   Musculoskeletal negative musculoskeletal ROS (+)   Abdominal   Peds  Hematology  (+) anemia , Metastatic prostate cancer   Anesthesia Other Findings  LHC in 4/22 with 80% proximal RCA stenosis (nondominant), totally occluded mLAD with collaterals, 60% D1.  No intervention.  Echo (4/22) with EF < 20%, moderate-severe LV dilation, normal RV, severe LAE, severe MR.   Reproductive/Obstetrics                            Anesthesia Physical Anesthesia Plan  ASA: 4  Anesthesia Plan: MAC   Post-op Pain Management:    Induction: Intravenous  PONV Risk Score and Plan: 1 and Treatment may vary due to age or medical condition  Airway Management Planned: Natural Airway, Nasal Cannula and Simple Face Mask  Additional Equipment: None  Intra-op Plan:   Post-operative Plan:   Informed Consent: I have reviewed the patients History and Physical, chart, labs and discussed the procedure including the risks, benefits and alternatives for the proposed anesthesia with  the patient or authorized representative who has indicated his/her understanding and acceptance.       Plan Discussed with:   Anesthesia Plan Comments: (Plan for ketamine/precedex considering extremely high risk cardiac comorbidities. Explained to patient that this will be a sedation case and he may not be entirely asleep for the procedure.)       Anesthesia Quick Evaluation

## 2021-01-26 ENCOUNTER — Ambulatory Visit (HOSPITAL_BASED_OUTPATIENT_CLINIC_OR_DEPARTMENT_OTHER): Payer: Medicare Other

## 2021-01-26 ENCOUNTER — Encounter (HOSPITAL_COMMUNITY)
Admission: RE | Disposition: A | Discharge status: Home / Self Care | Payer: Self-pay | Source: Home / Self Care | Attending: Cardiology

## 2021-01-26 ENCOUNTER — Ambulatory Visit (HOSPITAL_COMMUNITY): Payer: Medicare Other | Admitting: Anesthesiology

## 2021-01-26 ENCOUNTER — Encounter (HOSPITAL_COMMUNITY): Payer: Self-pay | Admitting: *Deleted

## 2021-01-26 ENCOUNTER — Other Ambulatory Visit (HOSPITAL_COMMUNITY): Payer: Self-pay | Admitting: *Deleted

## 2021-01-26 ENCOUNTER — Ambulatory Visit (HOSPITAL_COMMUNITY)
Admission: RE | Admit: 2021-01-26 | Discharge: 2021-01-26 | Disposition: A | Discharge status: Home / Self Care | Payer: Medicare Other | Attending: Cardiology | Admitting: Cardiology

## 2021-01-26 ENCOUNTER — Other Ambulatory Visit: Payer: Self-pay

## 2021-01-26 ENCOUNTER — Encounter (HOSPITAL_COMMUNITY): Payer: Self-pay | Admitting: Cardiology

## 2021-01-26 DIAGNOSIS — I340 Nonrheumatic mitral (valve) insufficiency: Secondary | ICD-10-CM

## 2021-01-26 DIAGNOSIS — N183 Chronic kidney disease, stage 3 unspecified: Secondary | ICD-10-CM | POA: Insufficient documentation

## 2021-01-26 DIAGNOSIS — I130 Hypertensive heart and chronic kidney disease with heart failure and stage 1 through stage 4 chronic kidney disease, or unspecified chronic kidney disease: Secondary | ICD-10-CM | POA: Insufficient documentation

## 2021-01-26 DIAGNOSIS — F17210 Nicotine dependence, cigarettes, uncomplicated: Secondary | ICD-10-CM | POA: Insufficient documentation

## 2021-01-26 DIAGNOSIS — I447 Left bundle-branch block, unspecified: Secondary | ICD-10-CM | POA: Insufficient documentation

## 2021-01-26 DIAGNOSIS — Z79899 Other long term (current) drug therapy: Secondary | ICD-10-CM | POA: Insufficient documentation

## 2021-01-26 DIAGNOSIS — I2510 Atherosclerotic heart disease of native coronary artery without angina pectoris: Secondary | ICD-10-CM | POA: Diagnosis not present

## 2021-01-26 DIAGNOSIS — I255 Ischemic cardiomyopathy: Secondary | ICD-10-CM | POA: Diagnosis not present

## 2021-01-26 DIAGNOSIS — Z7982 Long term (current) use of aspirin: Secondary | ICD-10-CM | POA: Diagnosis not present

## 2021-01-26 DIAGNOSIS — I5022 Chronic systolic (congestive) heart failure: Secondary | ICD-10-CM | POA: Insufficient documentation

## 2021-01-26 HISTORY — PX: TEE WITHOUT CARDIOVERSION: SHX5443

## 2021-01-26 SURGERY — ECHOCARDIOGRAM, TRANSESOPHAGEAL
Anesthesia: Monitor Anesthesia Care

## 2021-01-26 MED ORDER — FENTANYL CITRATE (PF) 100 MCG/2ML IJ SOLN
INTRAMUSCULAR | Status: DC | PRN
  Administered 2021-01-26: 25 ug via INTRAVENOUS

## 2021-01-26 MED ORDER — MIDAZOLAM HCL 2 MG/2ML IJ SOLN
INTRAMUSCULAR | Status: DC | PRN
  Administered 2021-01-26: 1 mg via INTRAVENOUS

## 2021-01-26 MED ORDER — TORSEMIDE 20 MG PO TABS: 40.0000 mg | ORAL_TABLET | Freq: Two times a day (BID) | ORAL | 6 refills | Status: DC

## 2021-01-26 MED ORDER — SODIUM CHLORIDE 0.9 % IV SOLN: INTRAVENOUS | Status: DC | PRN

## 2021-01-26 MED ORDER — DEXMEDETOMIDINE (PRECEDEX) IN NS 20 MCG/5ML (4 MCG/ML) IV SYRINGE
PREFILLED_SYRINGE | INTRAVENOUS | Status: DC | PRN
  Administered 2021-01-26 (×2): 8 ug via INTRAVENOUS
  Administered 2021-01-26: 4 ug via INTRAVENOUS

## 2021-01-26 MED ORDER — LIDOCAINE VISCOUS HCL 2 % MT SOLN
OROMUCOSAL | Status: AC
  Filled 2021-01-26: qty 15

## 2021-01-26 MED ORDER — PHENYLEPHRINE 40 MCG/ML (10ML) SYRINGE FOR IV PUSH (FOR BLOOD PRESSURE SUPPORT)
PREFILLED_SYRINGE | INTRAVENOUS | Status: DC | PRN
  Administered 2021-01-26: 120 ug via INTRAVENOUS
  Administered 2021-01-26: 80 ug via INTRAVENOUS

## 2021-01-26 MED ORDER — LIDOCAINE VISCOUS HCL 2 % MT SOLN
15.0000 mL | Freq: Once | OROMUCOSAL | Status: AC
  Administered 2021-01-26: 15 mL via OROMUCOSAL

## 2021-01-26 MED ORDER — EPHEDRINE SULFATE-NACL 50-0.9 MG/10ML-% IV SOSY
PREFILLED_SYRINGE | INTRAVENOUS | Status: DC | PRN
  Administered 2021-01-26: 5 mg via INTRAVENOUS
  Administered 2021-01-26: 10 mg via INTRAVENOUS

## 2021-01-26 NOTE — Discharge Instructions (Signed)

## 2021-01-26 NOTE — Anesthesia Procedure Notes (Signed)
Procedure Name: MAC Date/Time: 01/26/2021 8:15 AM Performed by: Imagene Riches, CRNA Pre-anesthesia Checklist: Patient identified, Emergency Drugs available, Suction available, Patient being monitored and Timeout performed Oxygen Delivery Method: Nasal cannula

## 2021-01-26 NOTE — Transfer of Care (Signed)
Immediate Anesthesia Transfer of Care Note  Patient: Tommy Ward [facility]  Procedure(s) Performed: TRANSESOPHAGEAL ECHOCARDIOGRAM (TEE)  Patient Location: Endoscopy Unit  Anesthesia Type:MAC  Level of Consciousness: awake, alert  and oriented  Airway & Oxygen Therapy: Patient Spontanous Breathing  Post-op Assessment: Report given to RN and Post -op Vital signs reviewed and stable  Post vital signs: Reviewed and stable  Last Vitals:  Vitals Value Taken Time  BP    Temp    Pulse 67 01/26/21 0833  Resp 18 01/26/21 0833  SpO2 96 % 01/26/21 0833  Vitals shown include unvalidated device data.  Last Pain:  Vitals:   01/26/21 0714  TempSrc: Temporal  PainSc: 0-No pain         Complications: No notable events documented.

## 2021-01-26 NOTE — CV Procedure (Signed)
Procedure: TEE  Sedation: Propofol per anesthesiology  Indication: Mitral regurgitation.   Findings: Please see echo section for full report.  The left ventricle was moderately dilated with normal wall thickness.  Diffuse hypokinesis with septal-lateral dyssynchrony, EF < 20%.  No LV thrombus noted.  The right ventricle was normal in size with mildly decreased systolic function.  Mild right atrial enlargement.  Mild-moderate left atrial enlargement with no LA appendage thrombus.  Moderate TR, peak RV-RA gradient 33 mmHg.  The mitral valve was normal in appearance with moderate central regurgitation due to posterior leaflet restriction.  PISA ERO 0.2 cm^2, MR volume 34 cc.  Vena contracta area 0.21 cm^2.  There was systolic flattening but not flow reversal in the pulmonary vein systolic doppler pattern.  Normal caliber thoracic aorta with mild plaque.   Impression: Mitral regurgitation appears moderate, likely functional due to annular dilatation.    Loralie Champagne 01/26/2021

## 2021-01-26 NOTE — Interval H&P Note (Signed)
History and Physical Interval Note:  01/26/2021 8:12 AM  Tommy Ward  has presented today for surgery, with the diagnosis of pulmonary hypertension.  The various methods of treatment have been discussed with the patient and family. After consideration of risks, benefits and other options for treatment, the patient has consented to  Procedure(s): TRANSESOPHAGEAL ECHOCARDIOGRAM (TEE) (N/A) as a surgical intervention.  The patient's history has been reviewed, patient examined, no change in status, stable for surgery.  I have reviewed the patient's chart and labs.  Questions were answered to the patient's satisfaction.     Cherice Glennie [employer]

## 2021-01-26 NOTE — Anesthesia Postprocedure Evaluation (Signed)
Anesthesia Post Note  Patient: Tommy Ward [facility]  Procedure(s) Performed: TRANSESOPHAGEAL ECHOCARDIOGRAM (TEE)     Patient location during evaluation: Endoscopy Anesthesia Type: MAC Level of consciousness: awake and alert Pain management: pain level controlled Vital Signs Assessment: post-procedure vital signs reviewed and stable Respiratory status: spontaneous breathing, nonlabored ventilation and respiratory function stable Cardiovascular status: blood pressure returned to baseline and stable Postop Assessment: no apparent nausea or vomiting Anesthetic complications: no   No notable events documented.  Last Vitals:  Vitals:   01/26/21 0843 01/26/21 0852  BP: 110/62 (!) 136/110  Pulse: (!) 40 (!) 58  Resp: (!) 24 18  Temp:    SpO2: 96% 96%    Last Pain:  Vitals:   01/26/21 0852  TempSrc:   PainSc: 0-No pain                 Lidia Collum

## 2021-01-27 ENCOUNTER — Other Ambulatory Visit (HOSPITAL_COMMUNITY): Payer: Self-pay | Admitting: Cardiology

## 2021-01-29 ENCOUNTER — Other Ambulatory Visit (HOSPITAL_COMMUNITY): Payer: Self-pay | Admitting: *Deleted

## 2021-01-29 MED ORDER — TORSEMIDE 20 MG PO TABS
40.0000 mg | ORAL_TABLET | Freq: Two times a day (BID) | ORAL | 6 refills | Status: DC
Start: 2021-01-29 — End: 2021-01-30

## 2021-01-30 ENCOUNTER — Ambulatory Visit (HOSPITAL_COMMUNITY)
Admission: RE | Admit: 2021-01-30 | Discharge: 2021-01-30 | Disposition: A | Discharge status: Home / Self Care | Payer: Medicare Other | Source: Ambulatory Visit | Attending: Cardiology | Admitting: Cardiology

## 2021-01-30 ENCOUNTER — Encounter (HOSPITAL_COMMUNITY): Payer: Self-pay | Admitting: Cardiology

## 2021-01-30 ENCOUNTER — Other Ambulatory Visit: Payer: Self-pay

## 2021-01-30 VITALS — BP 122/60 | HR 74 | Wt 187.0 lb

## 2021-01-30 DIAGNOSIS — Z7982 Long term (current) use of aspirin: Secondary | ICD-10-CM | POA: Insufficient documentation

## 2021-01-30 DIAGNOSIS — I130 Hypertensive heart and chronic kidney disease with heart failure and stage 1 through stage 4 chronic kidney disease, or unspecified chronic kidney disease: Secondary | ICD-10-CM | POA: Insufficient documentation

## 2021-01-30 DIAGNOSIS — Z8249 Family history of ischemic heart disease and other diseases of the circulatory system: Secondary | ICD-10-CM | POA: Diagnosis not present

## 2021-01-30 DIAGNOSIS — I2510 Atherosclerotic heart disease of native coronary artery without angina pectoris: Secondary | ICD-10-CM | POA: Diagnosis not present

## 2021-01-30 DIAGNOSIS — Z79899 Other long term (current) drug therapy: Secondary | ICD-10-CM | POA: Insufficient documentation

## 2021-01-30 DIAGNOSIS — I255 Ischemic cardiomyopathy: Secondary | ICD-10-CM | POA: Diagnosis not present

## 2021-01-30 DIAGNOSIS — I340 Nonrheumatic mitral (valve) insufficiency: Secondary | ICD-10-CM | POA: Diagnosis not present

## 2021-01-30 DIAGNOSIS — R7989 Other specified abnormal findings of blood chemistry: Secondary | ICD-10-CM | POA: Diagnosis not present

## 2021-01-30 DIAGNOSIS — Z8546 Personal history of malignant neoplasm of prostate: Secondary | ICD-10-CM | POA: Diagnosis not present

## 2021-01-30 DIAGNOSIS — E785 Hyperlipidemia, unspecified: Secondary | ICD-10-CM | POA: Insufficient documentation

## 2021-01-30 DIAGNOSIS — I447 Left bundle-branch block, unspecified: Secondary | ICD-10-CM | POA: Insufficient documentation

## 2021-01-30 DIAGNOSIS — F17210 Nicotine dependence, cigarettes, uncomplicated: Secondary | ICD-10-CM | POA: Diagnosis not present

## 2021-01-30 DIAGNOSIS — I5022 Chronic systolic (congestive) heart failure: Secondary | ICD-10-CM | POA: Diagnosis present

## 2021-01-30 DIAGNOSIS — N183 Chronic kidney disease, stage 3 unspecified: Secondary | ICD-10-CM | POA: Insufficient documentation

## 2021-01-30 LAB — COMPREHENSIVE METABOLIC PANEL
ALT: 32 U/L (ref 0–44)
AST: 22 U/L (ref 15–41)
Albumin: 3.2 g/dL — ABNORMAL LOW (ref 3.5–5.0)
Alkaline Phosphatase: 95 U/L (ref 38–126)
Anion gap: 11 (ref 5–15)
BUN: 26 mg/dL — ABNORMAL HIGH (ref 8–23)
CO2: 34 mmol/L — ABNORMAL HIGH (ref 22–32)
Calcium: 9.6 mg/dL (ref 8.9–10.3)
Chloride: 95 mmol/L — ABNORMAL LOW (ref 98–111)
Creatinine, Ser: 1.85 mg/dL — ABNORMAL HIGH (ref 0.61–1.24)
GFR, Estimated: 37 mL/min — ABNORMAL LOW (ref >60)
Glucose, Bld: 138 mg/dL — ABNORMAL HIGH (ref 70–99)
Potassium: 3.3 mmol/L — ABNORMAL LOW (ref 3.5–5.1)
Sodium: 140 mmol/L (ref 135–145)
Total Bilirubin: 1 mg/dL (ref 0.3–1.2)
Total Protein: 5.9 g/dL — ABNORMAL LOW (ref 6.5–8.1)

## 2021-01-30 LAB — DIGOXIN LEVEL: Digoxin Level: 0.7 ng/mL — ABNORMAL LOW (ref 0.8–2.0)

## 2021-01-30 MED ORDER — LOSARTAN POTASSIUM 25 MG PO TABS: 12.5000 mg | ORAL_TABLET | Freq: Every day | ORAL | 3 refills | Status: DC

## 2021-01-30 MED ORDER — TORSEMIDE 20 MG PO TABS: ORAL_TABLET | ORAL | 11 refills | Status: DC

## 2021-01-30 MED ORDER — LOSARTAN POTASSIUM 25 MG PO TABS: 12.5000 mg | ORAL_TABLET | Freq: Every day | ORAL | 0 refills | Status: DC

## 2021-01-30 MED ORDER — SPIRONOLACTONE 25 MG PO TABS: 25.0000 mg | ORAL_TABLET | Freq: Every day | ORAL | 3 refills | Status: DC

## 2021-01-30 NOTE — Telephone Encounter (Signed)
Advanced Heart Failure Patient Advocate Encounter  Prior Authorization for Torsemide has been approved.    PA# : [id number] Effective dates: 01/22/21 through 01/22/22  [name], CPhT

## 2021-01-30 NOTE — Patient Instructions (Signed)
Labs done today. We will contact you only if your labs are abnormal.  START Losartan 12.5mg  (1/2 tablet) by mouth daily.   INCREASE Spironolactone to 25mg  (1 tablet) by mouth daily.   INCREASE Torsemide to 60mg  (3 tablets) by mouth every morning and 40mg  (2 tablets) by mouth every evening.   No other medication changes were made. Please continue all current medications as prescribed.  Your physician recommends that you schedule a follow-up appointment in: 10 days for a lab only appointment in [place] and in 3 weeks with our [facility] here in our office.  If you have any questions or concerns before your next appointment please send [place] a message through [place] or call our office at [phone].    TO LEAVE A MESSAGE FOR THE NURSE SELECT OPTION 2, PLEASE LEAVE A MESSAGE INCLUDING: YOUR NAME DATE OF BIRTH CALL BACK NUMBER REASON FOR CALL**this is important as we prioritize the call backs  YOU WILL RECEIVE A CALL BACK THE SAME DAY AS LONG AS YOU CALL BEFORE 4:00 PM   Do the following things EVERYDAY: Weigh yourself in the morning before breakfast. Write it down and keep it in a log. Take your medicines as prescribed Eat low salt foods--Limit salt (sodium) to 2000 mg per day.  Stay as active as you can everyday Limit all fluids for the day to less than 2 liters   At the [facility], you and your health needs are our priority. As part of our continuing mission to provide you with exceptional heart care, we have created designated Provider Care Teams. These Care Teams include your primary Cardiologist (physician) and Advanced Practice Providers (APPs- Physician Assistants and Nurse Practitioners) who all work together to provide you with the care you need, when you need it.   You may see any of the following providers on your designated Care Team at your next follow up: Dr Glori Bickers Dr Haynes Kerns, NP Lyda Jester, [place] Audry Riles,  PharmD   Please be sure to bring in all your medications bottles to every appointment.

## 2021-01-30 NOTE — Progress Notes (Signed)
PCP: Serita Grammes, MD Cardiology: Dr. [name] HF Cardiology: Dr. Aundra Dubin  80 y.o. with history of prostate cancer, CKD stage 3, CAD, and ischemic cardiomyopathy was referred by Dr. [name] for evaluation of CHF.  Patient developed gradually worsening exertional dyspnea starting early this year.  The shortness of breath has been especially bad for about 1.5 months.  He had an echo done in 4/22, showing EF < 20%, moderate-severe LV dilation, normal RV, severe MR.  LHC/RHC was done showing low cardiac index at 1.81 and occluded mid LAD.  No intervention.  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   TEE was done 6/22, showing EF < 20% with septal-lateral dyssynchrony, mildly decreased RV systolic function, moderate TR, moderate central MR with ERO 0.2 cm^2.   Patient returns for followup of CHF.  Weight is down 3 lbs.  He is doing better on higher diuretic dose but is still "swollen."  He is short of breath walking up a hill/incline.  Short of breath with yardwork.  No problems walking on flat ground.  No orthopnea/PND.  No chest pain.  No lightheadedness.   Labs (5/22): K 3.4, creatinine 1.7 => 2.15, AST 123 => 189, ALT 322 =>190, tbili 1.7, Hgb 12, LDL 38 Labs (6/22): Viral hepatitis serologies negative  PMH:  1. Hyperlipidemia 2. HTN 3. CKD stage 3 4. 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  Treated with radiation and currently on denosumab.   5. Cholecystectomy 6. CAD: LHC in 4/22 with 80% proximal RCA stenosis (nondominant), totally occluded mLAD with collaterals, 60% D1.  No intervention.  7. Chronic systolic CHF: Ischemic cardiomyopathy.   - Echo (4/22) with EF < 20%, moderate-severe LV dilation, normal RV, severe LAE, severe MR.  - RHC (4/22): mean RA 3, PA 39/10, mean PCWP 12, CI 1.81 - Echo (6/22): EF < 20% with septal-lateral dyssynchrony, mildly decreased RV systolic function, moderate TR, moderate  central MR with ERO 0.2 cm^2.  8. Mitral regurgitation: Severe on 4/22 echo.  - TEE (6/22) with moderate central MR with ERO 0.2 cm^2 (functional, annular dilatation).  9. Elevated LFTs: Viral hepatitis labs negative.  RUQ [place] (6/22) was not suggestive of cirrhosis, ascites noted.   Social History   Socioeconomic History   Marital status: Married    Spouse name: Not on file   Number of children: Not on file   Years of education: Not on file   Highest education level: Not on file  Occupational History   Not on file  Tobacco Use   Smoking status: Every Day    Packs/day: 0.50    Years: 50.00    Pack years: 25.00    Types: Cigarettes   Smokeless tobacco: Never  Vaping Use   Vaping Use: Never used  Substance and Sexual Activity   Alcohol use: Not Currently   Drug use: Never   Sexual activity: Not on file  Other Topics Concern   Not on file  Social History Narrative   Not on file   Social Determinants of Health   Financial Resource Strain: Not on file  Food Insecurity: Not on file  Transportation Needs: Not on file  Physical Activity: Not on file  Stress: Not on file  Social Connections: Not on file  Intimate Partner Violence: Not on file   Family History  Problem Relation Age of Onset   Leukemia Mother    Prostate cancer Father    Heart attack Brother  Colon cancer Neg Hx    Rectal cancer Neg Hx    Stomach cancer Neg Hx    Esophageal cancer Neg Hx    ROS: All systems reviewed and negative except as per HPI.   Current Outpatient Medications  Medication Sig Dispense Refill   acetaminophen (TYLENOL) 500 MG tablet Take 500 mg by mouth every 6 (six) hours as needed for moderate pain or headache.     aspirin EC 81 MG tablet Take 1 tablet (81 mg total) by mouth daily. 90 tablet 3   b complex vitamins capsule Take 1 capsule by mouth daily.     calcium-vitamin D (OSCAL WITH D) 500-200 MG-UNIT tablet Take 1 tablet by mouth daily.     cetirizine (ZYRTEC) 10 MG tablet  Take 10 mg by mouth daily as needed for allergies.     cholecalciferol (VITAMIN D3) 25 MCG (1000 UT) tablet Take 1,000 Units by mouth daily.     cholestyramine light (PREVALITE) 4 GM/DOSE powder Take 4 g by mouth daily.     denosumab (XGEVA) 120 MG/1.7ML SOLN injection Inject 120 mg into the skin every 30 (thirty) days.     digoxin (LANOXIN) 0.125 MG tablet Take 0.5 tablets (0.0625 mg total) by mouth daily. 15 tablet 0   enzalutamide (XTANDI) 40 MG tablet Take 160 mg by mouth daily.     Evolocumab 140 MG/ML SOAJ Inject 140 mg into the skin every 14 (fourteen) days.     losartan (COZAAR) 25 MG tablet Take 0.5 tablets (12.5 mg total) by mouth daily. 15 tablet 0   metoprolol succinate (TOPROL XL) 25 MG 24 hr tablet Take 0.5 tablets (12.5 mg total) by mouth daily. 45 tablet 3   omeprazole (PRILOSEC) 40 MG capsule Take 40 mg by mouth daily.     ondansetron (ZOFRAN) 4 MG tablet Take 1 tablet (4 mg total) by mouth every 8 (eight) hours as needed for nausea or vomiting. 20 tablet 0   tamsulosin (FLOMAX) 0.4 MG CAPS capsule Take 0.4 mg by mouth daily.     pregabalin (LYRICA) 100 MG capsule Take 100 mg by mouth 2 (two) times daily as needed for muscle pain.     spironolactone (ALDACTONE) 25 MG tablet Take 1 tablet (25 mg total) by mouth daily. 90 tablet 3   torsemide (DEMADEX) 20 MG tablet Take 60 mg by mouth every morning. And takes 40 every evening     No current facility-administered medications for this encounter.   BP 122/60   Pulse 74   Wt 84.8 kg (187 lb)   SpO2 96%   BMI 27.62 kg/m  General: NAD Neck: JVP 8-9 cm with HJR, no thyromegaly or thyroid nodule.  Lungs: Clear to auscultation bilaterally with normal respiratory effort. CV: Nondisplaced PMI.  Heart regular S1/S2, no S3/S4, no murmur.  2+ edema 3/4 to knees bilaterally.  No carotid bruit.  Normal pedal pulses.  Abdomen: Soft, nontender, no hepatosplenomegaly, mild distention.  Skin: Intact without lesions or rashes.  Neurologic:  Alert and oriented x 3.  Psych: Normal affect. Extremities: No clubbing or cyanosis.  HEENT: Normal.   Assessment/Plan: 1. CAD: Occluded mid LAD and 80% proximal stenosis in nondominant RCA in 4/22.  No intervention.  No chest pain.  - Continue ASA 81 mg daily.  - Continue Repatha (intolerant of statins and Zetia).  Good LDL in 5/22.  2. Chronic systolic CHF: Ischemic cardiomyopathy.  Echo in 4/22 with EF < 20%, moderate-severe LV dilation, normal RV, severe LAE, severe  MR.  RHC in 4/22 with CI 1.8.  TEE in 6/22 with EF <20%, dyssynchrony, mildly decreased RV systolic function.  On exam, patient is still volume overloaded but better than last appointment. He has NYHA class III symptoms.   He has a wide LBBB.  - Continue Toprol XL 12.5 mg daily.  - He was unable to take Jardiance due to nausea, insurance will not cover Farxiga.   - Increase spironolactone to 25 mg daily.  - Continue digoxin 0.0625 mg daily (lower dose with CKD stage III), check level today.   - Increase torsemide to 60 qam/40 qpm.  BMET today and 10 days.  - Continue Lifevest for now, repeat echo in 7/22 (scheduled).  If EF is < 35%, I think he would be a good CRT-D candidate.  3. Mitral regurgitation: Severe on 5/22 TTE. However, TEE in 6/22 showed moderate functional central MR.    - MR not bad enough for Mitraclip, may improve with CRT.  4. Elevated LFTs: Noted on recent labs. Viral hepatitis labs negative.  Abdominal US did not show cirrhosis but did show ascites.  Etiology uncertain, ?congestive hepatopathy.  - Check LFTs today.  5. CKD stage 3: Follow closely with medication titration   Followup 3 wks in APP clinic.   Loralie Champagne 01/30/2021

## 2021-02-02 ENCOUNTER — Encounter: Payer: Self-pay | Admitting: Cardiology

## 2021-02-02 ENCOUNTER — Ambulatory Visit (INDEPENDENT_AMBULATORY_CARE_PROVIDER_SITE_OTHER): Payer: Medicare Other | Admitting: Cardiology

## 2021-02-02 ENCOUNTER — Other Ambulatory Visit: Payer: Self-pay

## 2021-02-02 VITALS — BP 118/60 | HR 62 | Ht 69.0 in | Wt 176.4 lb

## 2021-02-02 DIAGNOSIS — I10 Essential (primary) hypertension: Secondary | ICD-10-CM

## 2021-02-02 DIAGNOSIS — I2510 Atherosclerotic heart disease of native coronary artery without angina pectoris: Secondary | ICD-10-CM

## 2021-02-02 DIAGNOSIS — N183 Chronic kidney disease, stage 3 unspecified: Secondary | ICD-10-CM

## 2021-02-02 DIAGNOSIS — E7849 Other hyperlipidemia: Secondary | ICD-10-CM

## 2021-02-02 DIAGNOSIS — I5022 Chronic systolic (congestive) heart failure: Secondary | ICD-10-CM

## 2021-02-02 NOTE — Patient Instructions (Signed)
Medication Instructions:  No medication changes. *If you need a refill on your cardiac medications before your next appointment, please call your pharmacy*   Lab Work: Your physician recommends that you have your potassium checked today.  If you have labs (blood work) drawn today and your tests are completely normal, you will receive your results only by: [facility] (if you have MyChart) OR A paper copy in the mail If you have any lab test that is abnormal or we need to change your treatment, we will call you to review the results.   Testing/Procedures: None ordered   Follow-Up: At [facility], you and your health needs are our priority.  As part of our continuing mission to provide you with exceptional heart care, we have created designated Provider Care Teams.  These Care Teams include your primary Cardiologist (physician) and Advanced Practice Providers (APPs -  Physician Assistants and Nurse Practitioners) who all work together to provide you with the care you need, when you need it.  We recommend signing up for the patient portal called "MyChart".  Sign up information is provided on this After Visit Summary.  MyChart is used to connect with patients for Virtual Visits (Telemedicine).  Patients are able to view lab/test results, encounter notes, upcoming appointments, etc.  Non-urgent messages can be sent to your provider as well.   To learn more about what you can do with MyChart, go to [email or web address].    Your next appointment:   3 month(s)  The format for your next appointment:   In Person  Provider:   Jyl Heinz, MD   Other Instructions NA

## 2021-02-02 NOTE — Addendum Note (Signed)
Addended by: Truddie Hidden on: 02/02/2021 10:20 AM   Modules accepted: Orders

## 2021-02-02 NOTE — Progress Notes (Signed)
Cardiology Office Note:    Date:  02/02/2021   ID:  Tommy Ward, DOB Apr 09, 1941, MRN [id number]  PCP:  Serita Grammes, MD  Cardiologist:  Jenean Lindau, MD   Referring MD: Serita Grammes, MD    ASSESSMENT:    1. Coronary artery disease involving native coronary artery of native heart without angina pectoris   2. Essential hypertension   3. Familial hyperlipidemia   4. Chronic renal impairment, stage 3 (moderate), unspecified whether stage 3a or 3b CKD (HCC)    PLAN:    In order of problems listed above:  Coronary artery disease: Secondary prevention stressed with the patient.  Importance of compliance with diet medication stressed any vocalized understanding.  He was advised to walk half an hour a day 5 days a week to the best of his ability.  He agrees to do so. Cardiomyopathy and congestive heart failure: Congestive heart failure education was given.  Salt intake issues were discussed.  He was advised to weigh himself on a regular basis. Hypokalemia: Patient has multiple issues including renal insufficiency which is advanced and he is also on medications like ACE inhibitor's and spironolactone.  He is taking potassium supplementation and I will do a potassium level today.  I told him about the question that needs to be exercised with potassium levels.  He understands. Familial hyperlipidemia: On PCSK9 and managed by lipid clinic. He will see our congestive heart failure colleagues for evaluation for intracardiac defibrillator and he is on schedule with their appointments. Patient will be seen in follow-up appointment in 3 months or earlier if the patient has any concerns    Medication Adjustments/Labs and Tests Ordered: Current medicines are reviewed at length with the patient today.  Concerns regarding medicines are outlined above.  Orders Placed This Encounter  Procedures   Potassium    No orders of the defined types were placed in this encounter.    No  chief complaint on file.    History of Present Illness:    Tommy Ward is a 80 y.o. male.  Patient has past medical history of coronary artery disease and cardiomyopathy.  He underwent TEE for mitral valve regurgitation and was told that he would not need a MitraClip or any intervention.  He denies any chest pain orthopnea or PND.  He is wearing a life vest for primary prevention of sudden cardiac death.  He has not had any significant issues.  No chest pain orthopnea or PND.  At the time of my evaluation, the patient is alert awake oriented and in no distress.  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   Cataract 11/2013   bilateral   Chronic renal insufficiency, stage III (moderate) (HCC) 09/98/3382   Chronic systolic heart failure (HCC)    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 02/24/2019   Coronary arteriosclerosis 07/06/2012   Decreased cardiac ejection fraction 05/20/2019   Depressed left ventricular ejection fraction 12/05/2020   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   Essential hypertension 07/06/2012   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   Gastroesophageal reflux disease without esophagitis 03/22/2018  GERD (gastroesophageal reflux disease)    HFrEF (heart failure with reduced ejection fraction) ([place]) 11/23/2020   Hyperkalemia 08/24/2019   Hyperlipidemia    Hyperphosphatemia 11/25/2019   Hypertension    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   Left bundle branch block 07/06/2012   Malignant neoplasm of prostate ([place])    Malignant  neoplasm of prostate ([place])    Metabolic bone disease 95/18/8416   Mixed hyperlipidemia 07/06/2012   Nonrheumatic mitral valve regurgitation 12/05/2020   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   Olecranon bursitis of left elbow 06/25/2018   Pre-diabetes 12/05/2020   Pulmonary hypertension ([place]) 12/05/2020   S/P radiation therapy > 12 wks ago 2010   prostate CA   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[place])    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[place]) 12/05/2020   Tobacco use 12/05/2020   Vitamin D deficiency 08/24/2019    Past Surgical History:  Procedure Laterality Date   CHOLECYSTECTOMY  01/2003   lap chole   left hip repaired     RIGHT/LEFT HEART CATH AND CORONARY ANGIOGRAPHY N/A 12/08/2020   Procedure: RIGHT/LEFT HEART CATH AND CORONARY ANGIOGRAPHY;  Surgeon: Wellington Hampshire, MD;  Location: [facility];  Service: Cardiovascular;  Laterality: N/A;   TEE WITHOUT CARDIOVERSION N/A 01/26/2021   Procedure: TRANSESOPHAGEAL ECHOCARDIOGRAM (TEE);  Surgeon: Larey Dresser, MD;  Location: [facility] ENDOSCOPY;  Service: Cardiovascular;  Laterality: N/A;    Current Medications: Current Meds  Medication Sig   acetaminophen (TYLENOL) 500 MG tablet Take 500 mg by mouth every 6 (six) hours as needed for moderate pain or headache.   aspirin EC 81 MG tablet Take 1 tablet (81 mg total) by mouth daily.   b complex vitamins capsule Take 1 capsule by mouth daily.   calcium-vitamin D (OSCAL WITH D) 500-200 MG-UNIT tablet Take 1 tablet by mouth daily.   cetirizine (ZYRTEC) 10 MG tablet Take 10 mg by mouth daily as needed for allergies.   cholecalciferol (VITAMIN D3) 25 MCG (1000 UT) tablet Take 1,000 Units by mouth daily.   cholestyramine light (PREVALITE) 4 GM/DOSE powder Take 4 g by mouth daily.   denosumab (XGEVA) 120 MG/1.7ML SOLN injection Inject 120 mg into the skin every 30 (thirty) days.   digoxin (LANOXIN) 0.125 MG tablet Take 0.5 tablets (0.0625 mg total) by mouth  daily.   enzalutamide (XTANDI) 40 MG tablet Take 160 mg by mouth daily.   Evolocumab 140 MG/ML SOAJ Inject 140 mg into the skin every 14 (fourteen) days.   losartan (COZAAR) 25 MG tablet Take 0.5 tablets (12.5 mg total) by mouth daily.   metoprolol succinate (TOPROL XL) 25 MG 24 hr tablet Take 0.5 tablets (12.5 mg total) by mouth daily.   omeprazole (PRILOSEC) 40 MG capsule Take 40 mg by mouth daily.   ondansetron (ZOFRAN) 4 MG tablet Take 1 tablet (4 mg total) by mouth every 8 (eight) hours as needed for nausea or vomiting.   pregabalin (LYRICA) 75 MG capsule Take 75 mg by mouth 3 (three) times daily.   spironolactone (ALDACTONE) 25 MG tablet Take 1 tablet (25 mg total) by mouth daily.   tamsulosin (FLOMAX) 0.4 MG CAPS capsule Take 0.4 mg by mouth daily.   torsemide (DEMADEX) 20 MG tablet Take 60 mg by mouth every morning. And takes 40 every evening     Allergies:   Ezetimibe and Statins   Social History   Socioeconomic History   Marital status: Married    Spouse name:  Not on file   Number of children: Not on file   Years of education: Not on file   Highest education level: Not on file  Occupational History   Not on file  Tobacco Use   Smoking status: Every Day    Packs/day: 0.50    Years: 50.00    Pack years: 25.00    Types: Cigarettes   Smokeless tobacco: Never  Vaping Use   Vaping Use: Never used  Substance and Sexual Activity   Alcohol use: Not Currently   Drug use: Never   Sexual activity: Not on file  Other Topics Concern   Not on file  Social History Narrative   Not on file   Social Determinants of Health   Financial Resource Strain: Not on file  Food Insecurity: Not on file  Transportation Needs: Not on file  Physical Activity: Not on file  Stress: Not on file  Social Connections: Not on file     Family History: The patient's family history includes Heart attack in his brother; Leukemia in his mother; Prostate cancer in his father. There is no history of  Colon cancer, Rectal cancer, Stomach cancer, or Esophageal cancer.  ROS:   Please see the history of present illness.    All other systems reviewed and are negative.  EKGs/Labs/Other Studies Reviewed:    The following studies were reviewed today: I discussed my findings with the patient in extensive length   Recent Labs: 12/05/2020: Magnesium 2.3 01/16/2021: Hemoglobin 12.0; Platelets 340 01/30/2021: ALT 32; BUN 26; Creatinine, Ser 1.85; Potassium 3.3; Sodium 140  Recent Lipid Panel    Component Value Date/Time   CHOL 111 01/16/2021 1511   CHOL 400 (H) 02/25/2019 0905   TRIG 76 01/16/2021 1511   HDL 58 01/16/2021 1511   HDL 51 02/25/2019 0905   CHOLHDL 1.9 01/16/2021 1511   VLDL 15 01/16/2021 1511   LDLCALC 38 01/16/2021 1511   LDLCALC 290 (H) 02/25/2019 0905    Physical Exam:    VS:  BP 118/60   Pulse 62   Ht 5\' 9"  (1.753 m)   Wt 176 lb 6.4 oz (80 kg)   SpO2 93%   BMI 26.05 kg/m     Wt Readings from Last 3 Encounters:  02/02/21 176 lb 6.4 oz (80 kg)  01/30/21 187 lb (84.8 kg)  01/26/21 184 lb (83.5 kg)     GEN: Patient is in no acute distress HEENT: Normal NECK: No JVD; No carotid bruits LYMPHATICS: No lymphadenopathy CARDIAC: Hear sounds regular, 2/6 systolic murmur at the apex. RESPIRATORY:  Clear to auscultation without rales, wheezing or rhonchi  ABDOMEN: Soft, non-tender, non-distended MUSCULOSKELETAL:  No edema; No deformity  SKIN: Warm and dry NEUROLOGIC:  Alert and oriented x 3 PSYCHIATRIC:  Normal affect   Signed, Jenean Lindau, MD  02/02/2021 10:05 AM    [place]

## 2021-02-03 LAB — POTASSIUM: Potassium: 3.5 mmol/L (ref 3.5–5.2)

## 2021-02-08 ENCOUNTER — Encounter: Payer: Self-pay | Admitting: Hematology and Oncology

## 2021-02-08 ENCOUNTER — Other Ambulatory Visit: Payer: Self-pay

## 2021-02-08 ENCOUNTER — Inpatient Hospital Stay: Payer: Medicare Other | Attending: Oncology

## 2021-02-08 ENCOUNTER — Inpatient Hospital Stay: Payer: Medicare Other

## 2021-02-08 VITALS — BP 130/59 | HR 80 | Temp 98.4°F | Resp 16 | Wt 171.0 lb

## 2021-02-08 DIAGNOSIS — C61 Malignant neoplasm of prostate: Secondary | ICD-10-CM

## 2021-02-08 DIAGNOSIS — C7951 Secondary malignant neoplasm of bone: Secondary | ICD-10-CM | POA: Diagnosis present

## 2021-02-08 LAB — CBC AND DIFFERENTIAL
HCT: 44 (ref 41–53)
Hemoglobin: 14.4 (ref 13.5–17.5)
Neutrophils Absolute: 2.97
Platelets: 278 (ref 150–399)
WBC: 5.6

## 2021-02-08 LAB — BASIC METABOLIC PANEL
BUN: 42 — AB (ref 4–21)
CO2: 37 — AB (ref 13–22)
Chloride: 92 — AB (ref 99–108)
Creatinine: 1.9 — AB (ref 0.6–1.3)
Glucose: 136
Potassium: 3.6 (ref 3.4–5.3)
Sodium: 137 (ref 137–147)

## 2021-02-08 LAB — COMPREHENSIVE METABOLIC PANEL
Albumin: 4.2 (ref 3.5–5.0)
Calcium: 9.9 (ref 8.7–10.7)

## 2021-02-08 LAB — HEPATIC FUNCTION PANEL
ALT: 19 (ref 10–40)
AST: 30 (ref 14–40)
Alkaline Phosphatase: 98 (ref 25–125)
Bilirubin, Total: 1

## 2021-02-08 LAB — CBC: RBC: 5.2 — AB (ref 3.87–5.11)

## 2021-02-08 MED ORDER — DENOSUMAB 120 MG/1.7ML ~~LOC~~ SOLN
120.0000 mg | Freq: Once | SUBCUTANEOUS | Status: AC
  Administered 2021-02-08: 120 mg via SUBCUTANEOUS

## 2021-02-08 MED ORDER — DENOSUMAB 120 MG/1.7ML ~~LOC~~ SOLN
SUBCUTANEOUS | Status: AC
  Filled 2021-02-08: qty 1.7

## 2021-02-08 NOTE — Progress Notes (Signed)
1521: PT STABLE AT TIME OF DISCHARGE  

## 2021-02-09 ENCOUNTER — Other Ambulatory Visit (HOSPITAL_COMMUNITY): Payer: Medicare Other

## 2021-02-09 LAB — PROSTATE-SPECIFIC AG, SERUM (LABCORP): Prostate Specific Ag, Serum: <0.1 ng/mL (ref 0.0–4.0)

## 2021-02-16 ENCOUNTER — Inpatient Hospital Stay (HOSPITAL_COMMUNITY)
Admission: EM | Admit: 2021-02-16 | Discharge: 2021-02-17 | DRG: 641 | Disposition: A | Discharge status: Home / Self Care | Payer: Medicare Other | Attending: Internal Medicine | Admitting: Internal Medicine

## 2021-02-16 ENCOUNTER — Other Ambulatory Visit: Payer: Self-pay

## 2021-02-16 ENCOUNTER — Encounter (HOSPITAL_COMMUNITY): Payer: Self-pay | Admitting: Emergency Medicine

## 2021-02-16 DIAGNOSIS — C7951 Secondary malignant neoplasm of bone: Secondary | ICD-10-CM | POA: Diagnosis not present

## 2021-02-16 DIAGNOSIS — Z20822 Contact with and (suspected) exposure to covid-19: Secondary | ICD-10-CM | POA: Diagnosis present

## 2021-02-16 DIAGNOSIS — G629 Polyneuropathy, unspecified: Secondary | ICD-10-CM | POA: Diagnosis present

## 2021-02-16 DIAGNOSIS — R519 Headache, unspecified: Secondary | ICD-10-CM | POA: Diagnosis present

## 2021-02-16 DIAGNOSIS — R7303 Prediabetes: Secondary | ICD-10-CM | POA: Diagnosis present

## 2021-02-16 DIAGNOSIS — I5022 Chronic systolic (congestive) heart failure: Secondary | ICD-10-CM | POA: Diagnosis present

## 2021-02-16 DIAGNOSIS — Z923 Personal history of irradiation: Secondary | ICD-10-CM | POA: Diagnosis not present

## 2021-02-16 DIAGNOSIS — Z806 Family history of leukemia: Secondary | ICD-10-CM

## 2021-02-16 DIAGNOSIS — I130 Hypertensive heart and chronic kidney disease with heart failure and stage 1 through stage 4 chronic kidney disease, or unspecified chronic kidney disease: Secondary | ICD-10-CM | POA: Diagnosis not present

## 2021-02-16 DIAGNOSIS — I2510 Atherosclerotic heart disease of native coronary artery without angina pectoris: Secondary | ICD-10-CM | POA: Diagnosis not present

## 2021-02-16 DIAGNOSIS — K219 Gastro-esophageal reflux disease without esophagitis: Secondary | ICD-10-CM | POA: Diagnosis not present

## 2021-02-16 DIAGNOSIS — Z79899 Other long term (current) drug therapy: Secondary | ICD-10-CM | POA: Diagnosis not present

## 2021-02-16 DIAGNOSIS — Z7982 Long term (current) use of aspirin: Secondary | ICD-10-CM | POA: Diagnosis not present

## 2021-02-16 DIAGNOSIS — Z8042 Family history of malignant neoplasm of prostate: Secondary | ICD-10-CM

## 2021-02-16 DIAGNOSIS — I255 Ischemic cardiomyopathy: Secondary | ICD-10-CM | POA: Diagnosis present

## 2021-02-16 DIAGNOSIS — N1832 Chronic kidney disease, stage 3b: Secondary | ICD-10-CM | POA: Diagnosis present

## 2021-02-16 DIAGNOSIS — Z888 Allergy status to other drugs, medicaments and biological substances status: Secondary | ICD-10-CM | POA: Diagnosis not present

## 2021-02-16 DIAGNOSIS — Z8546 Personal history of malignant neoplasm of prostate: Secondary | ICD-10-CM

## 2021-02-16 DIAGNOSIS — I272 Pulmonary hypertension, unspecified: Secondary | ICD-10-CM | POA: Diagnosis not present

## 2021-02-16 DIAGNOSIS — I420 Dilated cardiomyopathy: Secondary | ICD-10-CM | POA: Diagnosis not present

## 2021-02-16 DIAGNOSIS — I081 Rheumatic disorders of both mitral and tricuspid valves: Secondary | ICD-10-CM | POA: Diagnosis present

## 2021-02-16 DIAGNOSIS — I447 Left bundle-branch block, unspecified: Secondary | ICD-10-CM | POA: Diagnosis present

## 2021-02-16 DIAGNOSIS — Z8249 Family history of ischemic heart disease and other diseases of the circulatory system: Secondary | ICD-10-CM

## 2021-02-16 DIAGNOSIS — F17210 Nicotine dependence, cigarettes, uncomplicated: Secondary | ICD-10-CM | POA: Diagnosis not present

## 2021-02-16 DIAGNOSIS — E782 Mixed hyperlipidemia: Secondary | ICD-10-CM | POA: Diagnosis present

## 2021-02-16 DIAGNOSIS — Z85830 Personal history of malignant neoplasm of bone: Secondary | ICD-10-CM

## 2021-02-16 HISTORY — DX: Hypercalcemia: E83.52

## 2021-02-16 LAB — COMPREHENSIVE METABOLIC PANEL
ALT: 17 U/L (ref 0–44)
AST: 27 U/L (ref 15–41)
Albumin: 3.4 g/dL — ABNORMAL LOW (ref 3.5–5.0)
Alkaline Phosphatase: 76 U/L (ref 38–126)
Anion gap: 11 (ref 5–15)
BUN: 38 mg/dL — ABNORMAL HIGH (ref 8–23)
CO2: 31 mmol/L (ref 22–32)
Calcium: 11.6 mg/dL — ABNORMAL HIGH (ref 8.9–10.3)
Chloride: 96 mmol/L — ABNORMAL LOW (ref 98–111)
Creatinine, Ser: 1.89 mg/dL — ABNORMAL HIGH (ref 0.61–1.24)
GFR, Estimated: 36 mL/min — ABNORMAL LOW (ref >60)
Glucose, Bld: 125 mg/dL — ABNORMAL HIGH (ref 70–99)
Potassium: 4 mmol/L (ref 3.5–5.1)
Sodium: 138 mmol/L (ref 135–145)
Total Bilirubin: 1.3 mg/dL — ABNORMAL HIGH (ref 0.3–1.2)
Total Protein: 6.3 g/dL — ABNORMAL LOW (ref 6.5–8.1)

## 2021-02-16 LAB — CBC WITH DIFFERENTIAL/PLATELET
Abs Immature Granulocytes: 0.02 10*3/uL (ref 0.00–0.07)
Basophils Absolute: 0.1 10*3/uL (ref 0.0–0.1)
Basophils Relative: 1 %
Eosinophils Absolute: 0.1 10*3/uL (ref 0.0–0.5)
Eosinophils Relative: 1 %
HCT: 47.5 % (ref 39.0–52.0)
Hemoglobin: 15 g/dL (ref 13.0–17.0)
Immature Granulocytes: 0 %
Lymphocytes Relative: 30 %
Lymphs Abs: 1.8 10*3/uL (ref 0.7–4.0)
MCH: 27.4 pg (ref 26.0–34.0)
MCHC: 31.6 g/dL (ref 30.0–36.0)
MCV: 86.7 fL (ref 80.0–100.0)
Monocytes Absolute: 0.6 10*3/uL (ref 0.1–1.0)
Monocytes Relative: 10 %
Neutro Abs: 3.4 10*3/uL (ref 1.7–7.7)
Neutrophils Relative %: 58 %
Platelets: 302 10*3/uL (ref 150–400)
RBC: 5.48 MIL/uL (ref 4.22–5.81)
RDW: 20.8 % — ABNORMAL HIGH (ref 11.5–15.5)
WBC: 5.9 10*3/uL (ref 4.0–10.5)
nRBC: 0 % (ref 0.0–0.2)

## 2021-02-16 LAB — URINALYSIS, ROUTINE W REFLEX MICROSCOPIC
Bilirubin Urine: NEGATIVE
Glucose, UA: NEGATIVE mg/dL
Hgb urine dipstick: NEGATIVE
Ketones, ur: NEGATIVE mg/dL
Leukocytes,Ua: NEGATIVE
Nitrite: NEGATIVE
Protein, ur: NEGATIVE mg/dL
Specific Gravity, Urine: 1.015 (ref 1.005–1.030)
pH: 6 (ref 5.0–8.0)

## 2021-02-16 LAB — MAGNESIUM: Magnesium: 2.4 mg/dL (ref 1.7–2.4)

## 2021-02-16 LAB — DIGOXIN LEVEL: Digoxin Level: 0.9 ng/mL (ref 0.8–2.0)

## 2021-02-16 MED ORDER — PANTOPRAZOLE SODIUM 40 MG PO TBEC
40.0000 mg | DELAYED_RELEASE_TABLET | Freq: Every day | ORAL | Status: DC
  Administered 2021-02-17: 40 mg via ORAL
  Filled 2021-02-16: qty 1

## 2021-02-16 MED ORDER — ENZALUTAMIDE 40 MG PO TABS: 160.0000 mg | ORAL_TABLET | Freq: Every day | ORAL | Status: DC

## 2021-02-16 MED ORDER — SPIRONOLACTONE 25 MG PO TABS
25.0000 mg | ORAL_TABLET | Freq: Every day | ORAL | Status: DC
  Administered 2021-02-17: 25 mg via ORAL
  Filled 2021-02-16: qty 1

## 2021-02-16 MED ORDER — TORSEMIDE 20 MG PO TABS
60.0000 mg | ORAL_TABLET | ORAL | Status: DC
  Filled 2021-02-16: qty 3

## 2021-02-16 MED ORDER — PREGABALIN 25 MG PO CAPS
75.0000 mg | ORAL_CAPSULE | Freq: Three times a day (TID) | ORAL | Status: DC
  Administered 2021-02-16 – 2021-02-17 (×2): 75 mg via ORAL
  Filled 2021-02-16 (×2): qty 3

## 2021-02-16 MED ORDER — LOSARTAN POTASSIUM 25 MG PO TABS
12.5000 mg | ORAL_TABLET | Freq: Every day | ORAL | Status: DC
  Administered 2021-02-17: 12.5 mg via ORAL
  Filled 2021-02-16: qty 1

## 2021-02-16 MED ORDER — TAMSULOSIN HCL 0.4 MG PO CAPS
0.4000 mg | ORAL_CAPSULE | Freq: Every day | ORAL | Status: DC
  Administered 2021-02-17: 0.4 mg via ORAL
  Filled 2021-02-16: qty 1

## 2021-02-16 MED ORDER — TORSEMIDE 20 MG PO TABS: 40.0000 mg | ORAL_TABLET | Freq: Every evening | ORAL | Status: DC

## 2021-02-16 MED ORDER — NICOTINE 14 MG/24HR TD PT24
14.0000 mg | MEDICATED_PATCH | Freq: Every day | TRANSDERMAL | Status: DC
  Administered 2021-02-16 – 2021-02-17 (×2): 14 mg via TRANSDERMAL
  Filled 2021-02-16 (×2): qty 1

## 2021-02-16 MED ORDER — DIGOXIN 125 MCG PO TABS
0.0625 mg | ORAL_TABLET | Freq: Every day | ORAL | Status: DC
  Administered 2021-02-17: 0.0625 mg via ORAL
  Filled 2021-02-16: qty 1

## 2021-02-16 MED ORDER — ACETAMINOPHEN 650 MG RE SUPP: 650.0000 mg | Freq: Four times a day (QID) | RECTAL | Status: DC | PRN

## 2021-02-16 MED ORDER — CHOLESTYRAMINE LIGHT 4 G PO PACK
4.0000 g | PACK | ORAL | Status: DC
  Filled 2021-02-16: qty 1

## 2021-02-16 MED ORDER — ACETAMINOPHEN 325 MG PO TABS
650.0000 mg | ORAL_TABLET | Freq: Four times a day (QID) | ORAL | Status: DC | PRN
  Administered 2021-02-17: 650 mg via ORAL
  Filled 2021-02-16: qty 2

## 2021-02-16 MED ORDER — ENOXAPARIN SODIUM 40 MG/0.4ML IJ SOSY
40.0000 mg | PREFILLED_SYRINGE | Freq: Every day | INTRAMUSCULAR | Status: DC
  Administered 2021-02-17: 40 mg via SUBCUTANEOUS
  Filled 2021-02-16: qty 0.4

## 2021-02-16 MED ORDER — ASPIRIN EC 81 MG PO TBEC
81.0000 mg | DELAYED_RELEASE_TABLET | Freq: Every day | ORAL | Status: DC
  Administered 2021-02-17: 81 mg via ORAL
  Filled 2021-02-16: qty 1

## 2021-02-16 MED ORDER — METOPROLOL SUCCINATE ER 25 MG PO TB24
12.5000 mg | ORAL_TABLET | Freq: Every day | ORAL | Status: DC
  Administered 2021-02-17: 12.5 mg via ORAL
  Filled 2021-02-16: qty 1

## 2021-02-16 NOTE — ED Provider Notes (Signed)
Emergency Medicine Provider Triage Evaluation Note  Tommy Ward , a 80 y.o. male  was evaluated in triage.  Pt complains of a possible abnormal lab. Pt is wearing a life vest. Wife states he is having a defibrillator placed after wearing life vest for 90 days. Pt has been feeling more off balance the past few days. No falls or head trauma. He went to UC yesterday and was told that he could  possible be in renal failure so he came to the ED. He was experiencing edema in the BLEs so his provider recently increased his torsemide from 40mg  per day to 100mg  per day about 1 week ago.   Physical Exam  BP 110/65   Pulse 90   Temp 98.3 F (36.8 C) (Oral)   Resp 16   Ht 5\' 9"  (1.753 m)   Wt 78 kg   SpO2 96%   BMI 25.40 kg/m  Gen:   Awake, no distress   Resp:  Normal effort  MSK:   Moves extremities without difficulty  Other:    Medical Decision Making  Medically screening exam initiated at 12:39 PM.  Appropriate orders placed.  Tommy Ward was informed that the remainder of the evaluation will be completed by another provider, this initial triage assessment does not replace that evaluation, and the importance of remaining in the ED until their evaluation is complete.    Tommy Sexton, PA-C 02/16/21 1248    Horton, Alvin Critchley, DO 02/19/21 1901

## 2021-02-16 NOTE — ED Provider Notes (Signed)
[facility] EMERGENCY DEPARTMENT Provider Note   CSN: [id number] Arrival date & time: 02/16/21  1213     History Chief Complaint  Patient presents with   Abnormal Lab    Tommy Ward is a 80 y.o. male.  HPI Patient presents after sent in from at normal lab.  Had been drawn in [place] but family does not know the results.  Patient has been doing worse over the last few days.  Headaches some dizziness feeling weak all over.  Some muscle aches.  Wears a LifeVest and reportedly is hoping to get an AICD.  Recently had Xgeva infusion a week ago.  Also has increase of torsemide recently to get fluid off him due to his worsening fluid status.  Has been ejection fraction is less than 20%.     Past Medical History:  Diagnosis Date   Abnormal stress test 12/05/2020   AKI (acute kidney injury) ([place]) 11/25/2019   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[place]) 08/24/2019   Aortic valve disorder 07/08/2014   B12 deficiency 03/22/2018   Benign hypertension with chronic kidney disease, stage III ([place]) 05/20/2019   CAD (coronary artery disease) 11/23/2020   Cancer ([place]) 08/2008   prostate ca   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   Cataract 11/2013   bilateral   Chronic renal insufficiency, stage III (moderate) (HCC) 54/62/7035   Chronic systolic heart failure (HCC)    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 02/24/2019   Coronary arteriosclerosis 07/06/2012   Decreased cardiac ejection fraction 05/20/2019   Depressed left ventricular ejection fraction 12/05/2020   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   Essential hypertension 07/06/2012   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   Gastroesophageal reflux disease without esophagitis 03/22/2018   GERD (gastroesophageal reflux disease)    HFrEF (heart failure with reduced ejection fraction) ([place]) 11/23/2020   Hyperkalemia 08/24/2019   Hyperlipidemia    Hyperphosphatemia 11/25/2019   Hypertension    Hypertensive  heart disease without congestive heart failure 07/06/2012   Left bundle branch block 07/06/2012   Malignant neoplasm of prostate ([place])    Malignant neoplasm of prostate ([place])    Metabolic bone disease 00/93/8182   Mixed hyperlipidemia 07/06/2012   Nonrheumatic mitral valve regurgitation 12/05/2020   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   Olecranon bursitis of left elbow 06/25/2018   Pre-diabetes 12/05/2020   Pulmonary hypertension ([place]) 12/05/2020   S/P radiation therapy > 12 wks ago 2010   prostate CA   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[place])    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[place]) 12/05/2020   Tobacco use 12/05/2020   Vitamin D deficiency 08/24/2019    Patient Active Problem List   Diagnosis Date Noted   Hypercalcemia of malignancy 99/37/1696   Chronic systolic heart failure (HCC)    Abnormal stress test 12/05/2020   Depressed left ventricular ejection fraction 12/05/2020   Nonrheumatic mitral valve regurgitation 12/05/2020   Pre-diabetes 12/05/2020   Pulmonary hypertension ([place]) 12/05/2020   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[place]) 12/05/2020   Tobacco use 12/05/2020   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   HFrEF (heart failure with reduced ejection fraction) ([place]) 11/23/2020   CAD (coronary artery disease) 11/23/2020   GERD (gastroesophageal reflux disease)    Hyperlipidemia    Hypertension    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[place]) 06/02/2020   Malignant neoplasm of prostate ([place]) 06/02/2020   AKI (acute kidney injury) ([place]) 11/25/2019   Hyperphosphatemia 11/25/2019  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[place]) 08/24/2019   Hyperkalemia 58/04/9832   Metabolic bone disease 82/50/5397   Vitamin D deficiency 08/24/2019   Benign hypertension with chronic kidney disease, stage III ([place]) 05/20/2019   Decreased cardiac ejection fraction 05/20/2019   Familial  hyperlipidemia 02/24/2019   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 02/24/2019   Olecranon bursitis of left elbow 06/25/2018   B12 deficiency 03/22/2018   Gastroesophageal reflux disease without esophagitis 03/22/2018   Chronic renal insufficiency, stage III (moderate) (HCC) 11/27/2015   Aortic valve disorder 07/08/2014   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   Cataract 11/2013   Essential hypertension 07/06/2012   Coronary arteriosclerosis 07/06/2012   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   Left bundle branch block 07/06/2012   Mixed hyperlipidemia 07/06/2012   Cancer ([place]) 08/2008   S/P radiation therapy > 12 wks ago 2010    Past Surgical History:  Procedure Laterality Date   CHOLECYSTECTOMY  01/2003   lap chole   left hip repaired     RIGHT/LEFT HEART CATH AND CORONARY ANGIOGRAPHY N/A 12/08/2020   Procedure: RIGHT/LEFT HEART CATH AND CORONARY ANGIOGRAPHY;  Surgeon: Wellington Hampshire, MD;  Location: [facility];  Service: Cardiovascular;  Laterality: N/A;   TEE WITHOUT CARDIOVERSION N/A 01/26/2021   Procedure: TRANSESOPHAGEAL ECHOCARDIOGRAM (TEE);  Surgeon: Larey Dresser, MD;  Location: [facility];  Service: Cardiovascular;  Laterality: N/A;       Family History  Problem Relation Age of Onset   Leukemia Mother    Prostate cancer Father    Heart attack Brother    Colon cancer Neg Hx    Rectal cancer Neg Hx    Stomach cancer Neg Hx    Esophageal cancer Neg Hx     Social History   Tobacco Use   Smoking status: Every Day    Packs/day: 0.50    Years: 50.00    Pack years: 25.00    Types: Cigarettes   Smokeless tobacco: Never  Vaping Use   Vaping Use: Never used  Substance Use Topics   Alcohol use: Not Currently   Drug use: Never    Home Medications Prior to Admission medications   Medication Sig Start Date End Date Taking? Authorizing Provider  acetaminophen (TYLENOL) 500 MG tablet Take 500 mg by mouth every 6 (six)  hours as needed for moderate pain or headache.    [provider]  aspirin EC 81 MG tablet Take 1 tablet (81 mg total) by mouth daily. 09/20/19   Pixie Casino, MD  b complex vitamins capsule Take 1 capsule by mouth daily.    [provider]  calcium-vitamin D (OSCAL WITH D) 500-200 MG-UNIT tablet Take 1 tablet by mouth daily.    [provider]  cetirizine (ZYRTEC) 10 MG tablet Take 10 mg by mouth daily as needed for allergies.    [provider]  cholecalciferol (VITAMIN D3) 25 MCG (1000 UT) tablet Take 1,000 Units by mouth daily.    [provider]  cholestyramine light (PREVALITE) 4 GM/DOSE powder Take 4 g by mouth daily.    [provider]  denosumab (XGEVA) 120 MG/1.7ML SOLN injection Inject 120 mg into the skin every 30 (thirty) days.    [provider]  digoxin (LANOXIN) 0.125 MG tablet Take 0.5 tablets (0.0625 mg total) by mouth daily. 01/19/21   Larey Dresser, MD  enzalutamide Gillermina Phy) 40 MG tablet Take 160 mg by mouth daily.  [provider]  Evolocumab 140 MG/ML SOAJ Inject 140 mg into the skin every 14 (fourteen) days.    [provider]  losartan (COZAAR) 25 MG tablet Take 0.5 tablets (12.5 mg total) by mouth daily. 01/30/21   Larey Dresser, MD  metoprolol succinate (TOPROL XL) 25 MG 24 hr tablet Take 0.5 tablets (12.5 mg total) by mouth daily. 12/26/20   Richardo Priest, MD  omeprazole (PRILOSEC) 40 MG capsule Take 40 mg by mouth daily. 02/04/19   [provider]  ondansetron (ZOFRAN) 4 MG tablet Take 1 tablet (4 mg total) by mouth every 8 (eight) hours as needed for nausea or vomiting. 01/08/21   Revankar, Reita Cliche, MD  pregabalin (LYRICA) 75 MG capsule Take 75 mg by mouth 3 (three) times daily. 01/30/21   [provider]  spironolactone (ALDACTONE) 25 MG tablet Take 1 tablet (25 mg total) by mouth daily. 01/30/21   Larey Dresser, MD  tamsulosin (FLOMAX) 0.4 MG CAPS capsule Take 0.4  mg by mouth daily. 12/01/18   [provider]  torsemide (DEMADEX) 20 MG tablet Take 60 mg by mouth every morning. And takes 40 every evening    [provider]    Allergies    Ezetimibe and Statins  Review of Systems   Review of Systems  Constitutional:  Positive for appetite change and fatigue.  HENT:  Negative for congestion.   Respiratory:  Positive for shortness of breath.   Gastrointestinal:  Negative for abdominal pain.  Musculoskeletal:  Negative for back pain.  Skin:  Negative for rash.  Neurological:  Positive for headaches.  Psychiatric/Behavioral:  Negative for confusion.    Physical Exam Updated Vital Signs BP 128/63 (BP Location: Right Arm)   Pulse 86   Temp 97.8 F (36.6 C) (Oral)   Resp 18   Ht 5\' 9"  (1.753 m)   Wt 78 kg   SpO2 93%   BMI 25.40 kg/m   Physical Exam Vitals and nursing note reviewed.  HENT:     Head: Atraumatic.     Mouth/Throat:     Mouth: Mucous membranes are moist.  Eyes:     Pupils: Pupils are equal, round, and reactive to light.  Cardiovascular:     Rate and Rhythm: Regular rhythm.  Pulmonary:     Breath sounds: No wheezing.  Abdominal:     Tenderness: There is no abdominal tenderness.  Musculoskeletal:     Cervical back: Neck supple.     Right lower leg: Edema present.     Left lower leg: Edema present.  Skin:    General: Skin is warm.     Capillary Refill: Capillary refill takes less than 2 seconds.  Neurological:     Mental Status: He is alert and oriented to person, place, and time.    ED Results / Procedures / Treatments   Labs (all labs ordered are listed, but only abnormal results are displayed) Labs Reviewed  COMPREHENSIVE METABOLIC PANEL - Abnormal; Notable for the following components:      Result Value   Chloride 96 (*)    Glucose, Bld 125 (*)    BUN 38 (*)    Creatinine, Ser 1.89 (*)    Calcium 11.6 (*)    Total Protein 6.3 (*)    Albumin 3.4 (*)    Total Bilirubin 1.3 (*)    GFR,  Estimated 36 (*)    All other components within normal limits  CBC WITH DIFFERENTIAL/PLATELET - Abnormal; Notable  for the following components:   RDW 20.8 (*)    All other components within normal limits  RESP PANEL BY RT-PCR (FLU A&B, COVID) ARPGX2  URINALYSIS, ROUTINE W REFLEX MICROSCOPIC  DIGOXIN LEVEL  MAGNESIUM  RENAL FUNCTION PANEL  PTH, INTACT AND CALCIUM  CALCIUM / CREATININE RATIO, URINE  VITAMIN D 25 HYDROXY (VIT D DEFICIENCY, FRACTURES)  CALCITRIOL (1,25 DI-OH VIT D)  PROTEIN ELECTROPHORESIS, SERUM  UPEP/UIFE/LIGHT CHAINS/TP, 24-HR UR  ANGIOTENSIN CONVERTING ENZYME  BASIC METABOLIC PANEL  PHOSPHORUS    EKG None  Radiology No results found.  Procedures Procedures   Medications Ordered in ED Medications  aspirin EC tablet 81 mg (has no administration in time range)  enzalutamide (XTANDI) tablet 160 mg (160 mg Oral Not Given 02/17/21 0000)  cholestyramine light (PREVALITE) packet 4 g (has no administration in time range)  digoxin (LANOXIN) tablet 0.0625 mg (has no administration in time range)  losartan (COZAAR) tablet 12.5 mg (has no administration in time range)  metoprolol succinate (TOPROL-XL) 24 hr tablet 12.5 mg (has no administration in time range)  spironolactone (ALDACTONE) tablet 25 mg (has no administration in time range)  torsemide (DEMADEX) tablet 60 mg (has no administration in time range)  pantoprazole (PROTONIX) EC tablet 40 mg (has no administration in time range)  tamsulosin (FLOMAX) capsule 0.4 mg (has no administration in time range)  pregabalin (LYRICA) capsule 75 mg (75 mg Oral Given 02/16/21 2337)  enoxaparin (LOVENOX) injection 40 mg (has no administration in time range)  acetaminophen (TYLENOL) tablet 650 mg (has no administration in time range)    Or  acetaminophen (TYLENOL) suppository 650 mg (has no administration in time range)  nicotine (NICODERM CQ - dosed in mg/24 hours) patch 14 mg (14 mg Transdermal Patch Applied 02/16/21 2337)   torsemide (DEMADEX) tablet 40 mg (has no administration in time range)    ED Course  I have reviewed the triage vital signs and the nursing notes.  Pertinent labs & imaging results that were available during my care of the patient were reviewed by me and considered in my medical decision making (see chart for details).    MDM Rules/Calculators/A&P                         Patient presents with reported lab abnormality.  Unable to view outpatient labs but labs from today show mostly stable findings including renal insufficiency with creatinine about 1.9, however has a hypercalcemia.  Calcium of 11.6.  Was 9.9 2 weeks ago.  He just had an infusion of Xgeva that should have brought down his calcium a week ago.  Also on torsemide which also would likely bring his calcium go low.  Normally would add more fluids but with his delicate fluid balance due to his very low ejection fraction and just having his diuretics increased I feels the patient benefit from mission to the hospital for further adjustment of his hypercalcemia.  I believe he is symptomatic feeling weak feeling dizzy and with his headaches.  Will discuss with unassigned medicine.  Will admit to residents. Final Clinical Impression(s) / ED Diagnoses Final diagnoses:  Hypercalcemia    Rx / DC Orders ED Discharge Orders     None        Davonna Belling, MD 02/17/21 [phone]

## 2021-02-16 NOTE — H&P (Addendum)
Date: 02/17/2021               Patient Name:  Tommy Ward MRN: [id number]  DOB: 10/14/1940 Age / Sex: 80 y.o., male   PCP: Serita Grammes, MD         Medical Service: Internal Medicine Teaching Service         Attending Physician: Dr. Velna Ochs, MD    First Contact: Dr. Vinetta Bergamo Pager: [phone]  Second Contact: Dr. 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  He presents due to a call telling him to go to ED because his Calcium level was elevated.  In ED, corrected calcium was 12.1.  Over the last week, patient states that he has noticed increased fatigue and a left sided headache at his temple.  He has been taking tylenol every morning to help with this with some relief (no changes in vision or jaw pain).  Last week, he had increased bilateral lower extremity edema and his provider had him increase his torsemide from 40 mg per day to 100 mg per day.  He follows with [facility] and they plan to repeat his echo in July/22 to further evaluate for AICD placement.   Meds:  No outpatient medications have been marked as taking for the 02/16/21 encounter [facility] Encounter).     Allergies: Allergies as of 02/16/2021 - Review Complete 02/16/2021  Allergen Reaction Noted   Ezetimibe Other (See Comments) 07/28/2020   Statins  03/19/2018   Past Medical History:  Diagnosis Date   Abnormal stress test 12/05/2020   AKI (acute kidney injury) ([place]) 11/25/2019   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[place]) 08/24/2019    Aortic valve disorder 07/08/2014   B12 deficiency 03/22/2018   Benign hypertension with chronic kidney disease, stage III ([place]) 05/20/2019   CAD (coronary artery disease) 11/23/2020   Cancer (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   Essential hypertension 07/06/2012   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   Gastroesophageal reflux disease without esophagitis 03/22/2018   GERD (gastroesophageal reflux disease)    HFrEF (heart failure with reduced ejection fraction) ([place]) 11/23/2020   Hyperkalemia 08/24/2019   Hyperlipidemia    Hyperphosphatemia 11/25/2019   Hypertension    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   Left bundle branch block 07/06/2012   Malignant neoplasm of prostate ([place])    Malignant neoplasm of prostate ([place])    Metabolic bone disease 22/63/3354   Mixed hyperlipidemia 07/06/2012   Nonrheumatic mitral valve regurgitation 12/05/2020   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   Olecranon bursitis of left elbow 06/25/2018   Pre-diabetes 12/05/2020  Pulmonary hypertension ([place]) 12/05/2020   S/P radiation therapy > 12 wks ago 2010   prostate CA   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HCC)    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[place]) 12/05/2020   Tobacco use 12/05/2020   Vitamin D deficiency 08/24/2019    Family History:  Family History  Problem Relation Age of Onset   Leukemia Mother    Prostate cancer Father    Heart attack Brother    Colon cancer Neg Hx    Rectal cancer Neg Hx    Stomach cancer Neg  Hx    Esophageal cancer Neg Hx      Social History: patient resides with his wife in [place], he smokes a 1/2 pack per day, drinks occasionally (1 beer once a week), denies illicit substance use.  Review of Systems: A complete ROS was negative except as per HPI.   Physical Exam: Blood pressure 103/66, pulse 78, temperature 98.2 F (36.8 C), temperature source Oral, resp. rate 18, height $RemoveBe'5\' 9"'LvhxFbkVQ$  (1.753 m), weight 75 kg, SpO2 96 %. Gen: well-developed, well-nourished HENT: NCAT, hearing intact Eyes: No scleral icterus, scleral injection present bilaterally Neck: Supple, without lesions, thyroid non-enlarged CV: Mitral regurgitation noted, no rubs Pulm: CTAB, no rales, no wheezes Abd: Bowel sounds normal, no tenderness Extm: No upper or lower extremity edema,  Psychiatric: Oriented x3, intact recent and remote memory, judgement and insight, normal mood and effect  EKG: sinus rhythm, normal rate. LBBB. 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   He is admitted due to elevated corrected calcium at 12.1.    Moderate, asymptomatic hypercalcemia of malignancy He currently take Denosumab to help keep his calcium at a normal level. Additionally on toresemide.  Given his known history of bone mets, I would consider that to be a likely source of the hypercalcemia, however alk phos is normal. Interestingly, he is on a calcium-vitamin d supplement as well as a separate vitamin D supplement at home, which he has been reportedly taking. Xtandi, which he takes for prostate cancer, also has a reported 7% incidence of hypercalcemia. Not on a thiazide diuretic Plan -since he is asymptomatic, will defer  aggressive treatment for now. Would consider treating if he becomes symptomatic or if calcium increases >13 -Follow-up on labs including: PTH, Ca/ creatinine ratio urine, Vitamin D, Calcitron, Protein Electrophoresis, UPEP, Angiotension Converting Enzyme, BMP, and Phosphorus.  Prostate cancer with bone metastasis -Continue Enzulutamide 160 mg and Tamsulosin 0.4 mg -may need to get oncology's thoughts on the association between hypercalcemia and xtandi  Chronic ICM, Combined CHF (NYHA Class III) Chronic LBBB Moderate-severe tricuspid regurg  Severe MR (does not meet criteria for Mitraclip) Follows with Dr. Aundra Dubin in the [place] Failure clinic. New drop in LVEF noted in April--now <20%. RHC from April showed CI of 1.8. Now has a lifevest on and is planning for a repeat echocardiogram scheduled 7/22. If EF remains low, plan will be for CRT placement Not in an acute exacerbation. Plan -Continue Digoxin 0.0623 mg daily, Metoprolol 12.5 mg,Spironolactone 25 mg, Torsemide 60 mg -Blood pressures precluding entresto at this time -did not tolerate jardiance due to nausea, farxiga not covered by his insurance -Follows  with Heart Failure clinic, echo schedule for 7/22 to further evaluate for AICD placement.  CAD. LHC performed in April showed 80% RCA stenosis, chronic occlusion of mid LAD with faint collaterals Familial Hyperlipidemia -Continue Evolocumab and Cholestyramine, Asprin  Hypertension -Continue losartan 12.5 mg  CKD 3b. Renal function at baseline on admission.   GERD -Continue Pantoprazole 40 mg   Peripheral neuropathy -Continue Pregabalin 75 mg  Tobacco use disorder -patient with a history of smoking 0.5 packs/day. -Nicotine patch  Seasonal allergies -Continue Cetirizine 10 mg    Diet: regular diet w fluid restriction IVF: None VTE: Lovenox 40 mg Code: FULL  Dispo: Admit patient to Inpatient with expected length of stay greater than 2  midnights.  Signed: Christiana Fuchs, DO 02/17/2021, 7:13 AM  Pager: [phone] After 5pm on weekdays and 1pm on weekends: On Call pager: [phone]

## 2021-02-16 NOTE — ED Triage Notes (Signed)
Pt send by PCP for evaluation for possible renal failure, states he had an abnormal lab but is no able to recall what lab was abnormal.

## 2021-02-17 DIAGNOSIS — R519 Headache, unspecified: Secondary | ICD-10-CM | POA: Diagnosis not present

## 2021-02-17 LAB — RENAL FUNCTION PANEL
Albumin: 3 g/dL — ABNORMAL LOW (ref 3.5–5.0)
Anion gap: 11 (ref 5–15)
BUN: 33 mg/dL — ABNORMAL HIGH (ref 8–23)
CO2: 32 mmol/L (ref 22–32)
Calcium: 10.8 mg/dL — ABNORMAL HIGH (ref 8.9–10.3)
Chloride: 96 mmol/L — ABNORMAL LOW (ref 98–111)
Creatinine, Ser: 1.8 mg/dL — ABNORMAL HIGH (ref 0.61–1.24)
GFR, Estimated: 38 mL/min — ABNORMAL LOW (ref >60)
Glucose, Bld: 177 mg/dL — ABNORMAL HIGH (ref 70–99)
Phosphorus: 3.9 mg/dL (ref 2.5–4.6)
Potassium: 3.5 mmol/L (ref 3.5–5.1)
Sodium: 139 mmol/L (ref 135–145)

## 2021-02-17 LAB — COMPREHENSIVE METABOLIC PANEL
ALT: 17 U/L (ref 0–44)
AST: 28 U/L (ref 15–41)
Albumin: 3.1 g/dL — ABNORMAL LOW (ref 3.5–5.0)
Alkaline Phosphatase: 69 U/L (ref 38–126)
Anion gap: 8 (ref 5–15)
BUN: 34 mg/dL — ABNORMAL HIGH (ref 8–23)
CO2: 33 mmol/L — ABNORMAL HIGH (ref 22–32)
Calcium: 10.6 mg/dL — ABNORMAL HIGH (ref 8.9–10.3)
Chloride: 97 mmol/L — ABNORMAL LOW (ref 98–111)
Creatinine, Ser: 1.82 mg/dL — ABNORMAL HIGH (ref 0.61–1.24)
GFR, Estimated: 37 mL/min — ABNORMAL LOW (ref >60)
Glucose, Bld: 170 mg/dL — ABNORMAL HIGH (ref 70–99)
Potassium: 3.6 mmol/L (ref 3.5–5.1)
Sodium: 138 mmol/L (ref 135–145)
Total Bilirubin: 1.1 mg/dL (ref 0.3–1.2)
Total Protein: 5.7 g/dL — ABNORMAL LOW (ref 6.5–8.1)

## 2021-02-17 LAB — RESP PANEL BY RT-PCR (FLU A&B, COVID) ARPGX2
Influenza A by PCR: NEGATIVE
Influenza B by PCR: NEGATIVE
SARS Coronavirus 2 by RT PCR: NEGATIVE

## 2021-02-17 LAB — VITAMIN D 25 HYDROXY (VIT D DEFICIENCY, FRACTURES): Vit D, 25-Hydroxy: 85.17 ng/mL (ref 30–100)

## 2021-02-17 MED ORDER — ENZALUTAMIDE 40 MG PO TABS
160.0000 mg | ORAL_TABLET | Freq: Every day | ORAL | Status: DC
  Filled 2021-02-17: qty 4

## 2021-02-17 MED ORDER — SPIRONOLACTONE 25 MG PO TABS
12.5000 mg | ORAL_TABLET | Freq: Every day | ORAL | Status: DC
Start: 2021-02-17 — End: 2021-05-16

## 2021-02-17 MED ORDER — ENZALUTAMIDE 40 MG PO TABS: 160.0000 mg | ORAL_TABLET | Freq: Every day | ORAL | Status: DC

## 2021-02-17 NOTE — Discharge Instructions (Signed)
You were hospitalized for elevated calcium. Thank you for allowing [place] to be part of your care.   We made no changes to your medication regimen.   Please make sure to follow-up with you oncologist and your cardiologist and discharge.  We would like for you to be seen by the oncologist in 1 week.

## 2021-02-17 NOTE — Discharge Summary (Addendum)
Name: Tommy Ward MRN: [id number] DOB: Feb 06, 1941 80 y.o. PCP: Serita Grammes, MD  Date of Admission: 02/16/2021 12:33 PM Date of Discharge:  Attending Physician: Velna Ochs, MD  Discharge Diagnosis: Moderate, asymptomatic hypercalcemia of malignancy  Discharge Medications: Allergies as of 02/17/2021       Reactions   Ezetimibe Other (See Comments)   Myalgia   Gabapentin Itching, Other (See Comments)   Sore throat, breakouts   Statins Other (See Comments)   Myalgias (intolerance)        Medication List     STOP taking these medications    losartan 25 MG tablet Commonly known as: Cozaar       TAKE these medications    acetaminophen 500 MG tablet Commonly known as: TYLENOL Take 500 mg by mouth every 6 (six) hours as needed for moderate pain or headache.   aspirin EC 81 MG tablet Take 1 tablet (81 mg total) by mouth daily.   b complex vitamins capsule Take 1 capsule by mouth daily.   calcium-vitamin D 500-200 MG-UNIT tablet Commonly known as: OSCAL WITH D Take 1 tablet by mouth daily.   cetirizine 10 MG tablet Commonly known as: ZYRTEC Take 10 mg by mouth daily as needed for allergies.   cholecalciferol 25 MCG (1000 UNIT) tablet Commonly known as: VITAMIN D3 Take 1,000 Units by mouth daily.   cholestyramine light 4 GM/DOSE powder Commonly known as: PREVALITE Take 4 g by mouth daily.   denosumab 120 MG/1.7ML Soln injection Commonly known as: XGEVA Inject 120 mg into the skin every 30 (thirty) days.   digoxin 0.125 MG tablet Commonly known as: LANOXIN Take 0.5 tablets (0.0625 mg total) by mouth daily.   enzalutamide 40 MG tablet Commonly known as: XTANDI Take 160 mg by mouth daily.   Evolocumab 140 MG/ML Soaj Inject 140 mg into the skin every 14 (fourteen) days.   metoprolol succinate 25 MG 24 hr tablet Commonly known as: Toprol XL Take 0.5 tablets (12.5 mg total) by mouth daily. What changed: when to take this   omeprazole  40 MG capsule Commonly known as: PRILOSEC Take 40 mg by mouth daily.   ondansetron 4 MG tablet Commonly known as: Zofran Take 1 tablet (4 mg total) by mouth every 8 (eight) hours as needed for nausea or vomiting.   pregabalin 75 MG capsule Commonly known as: LYRICA Take 75 mg by mouth 3 (three) times daily.   spironolactone 25 MG tablet Commonly known as: ALDACTONE Take 0.5 tablets (12.5 mg total) by mouth daily.   tamsulosin 0.4 MG Caps capsule Commonly known as: FLOMAX Take 0.4 mg by mouth daily.   torsemide 20 MG tablet Commonly known as: DEMADEX Take 40-60 mg by mouth 2 (two) times daily. Take 60mg  in the morning and 40mg  in the evening.        Disposition and follow-up:   Tommy Ward was discharged from [facility] in Good condition.  At the hospital follow up visit please address:  1.  Moderate, asymptomatic hypercalcemia of malignancy: Stable at discharge with corrected calcium of 11.3. No treatment or fluids given due to EF <20%. Instructed to hold his calcium and vitamin D supplements on discharge until he follows up with his oncologist or PCP.   2.  Labs / imaging needed at time of follow-up: Calcium  3.  Pending labs/ test needing follow-up:  - PTH, PTHrp, ionized calcium, calcitriol, ACE, & protein electrophoresis   Follow-up Appointments: Patient will follow up  with cardiologist and oncologist. Patient will schedule these appointments  Hospital Course by problem list: 1. Moderate, asymptomatic hypercalcemia of malignancy Tommy Ward is a 80 yo male with a pmhx of metastatic prostate cancer to the bone, ischemic cardiomyopathy, HFrEF, and CKD IIIb who was sent to the ED by his doctor due to hypercalcemia with reported Ca level of 12. In the ED, inital labs showed a corrected calcium of 12. Patient has a history of prostate cancer with bone mets and has been prescribed Denosumab to maintain normocalcemia. Patients bone metastasis is  likely the cause of his hypercalcemia. Interestingly, he is on  vitamin D and calcium supplements which he states were prescribed by his oncologist. He also takes Xtandi for his prostate cancer which has a reported 7% incidence of hypercalcemia. Since he is asymptomatic, we deferred giving IV fluids given history of HF and EF of <20. Repeat labs today with a stable corrected calcium of 11.3. Patient will hold calcium and vitamin d supplements until he follows up with his oncologist. Several labs, PTH, Ca/ creatinine ratio urine, Calcitron, Protein Electrophoresis, UPEP, Angiotension Converting Enzyme are still pending but can be followed up after discharge.   2. Prostate cancer with bone metastasis We continued his home medication, Enzulutamide 160 mg and Tamsulosin 0.4 mg.  3. Orthostatic Hypotension  Patient reported two week history of dizziness upon standing and feeling unsteady on his feet. Orthostatic vitals this admission were positive (lying BP 127/69, HR 83 -> sitting BP 112/53, HR 69 -> Standing BP 105/64, HR 74) with an overall 22 point drop in his systolic blood pressure and an inappropriate decrease in his HR. His torsemide and spironolactone were recently increased by his cardiologist. He is also on a beta blocker. With his chronotropic incompetence, this could certainly be playing a role. He was given fall precautions and instructed to follow up with his cardiologist. His HF meds and diuretics were continued due to his severely reduced EF and poor cardiac reserve.     3. Chronic ICM, Combined CHF (NYHA Class III) Chronic LBBB Moderate-severe tricuspid regurg Severe MR (does not meet criteria for Mitraclip) Patient appears euvolemic and is not in an acute exacerbation. He follows with Dr. Aundra Dubin in the [facility]. New drop in LVEF noted in April--now <20%. RHC from April showed CI of 1.8. Now has a lifevest on and is planning for a repeat echocardiogram scheduled 7/22 to  evaluate for AICD placement. This hospitalization we continued his home mediations, Digoxin 0.0623 mg daily, Metoprolol 12.5 mg,Spironolactone 25 mg, Torsemide 60 mg. Delene Loll was not restarted due to low blood pressure. Patient will follow up with HF clinic.    5. CAD. Familial Hyperlipidemia Hypertension LHC performed in April showed 80% RCA stenosis, chronic occlusion of mid LAD with faint collaterals. We continued his home medications, Evolocumab and Cholestyramine, Asprin, Losartan 12.5 mg    CKD 3b. Patient's renal function was baseline on admission.   GERD We continued his home Pantoprazole 40 mg   Peripheral neuropathy We continued his home Pregabalin 75 mg   Tobacco use disorder Patient has a history of smoking 0.5 packs/day. Patient was provided with Nicotine patch   Seasonal allergies We continued his home Cetirizine 10 mg   Subjective: Patient endorses feeling well. He does endorse headache of 3 days and was reminded that he can ask nursing for tylenol if he needs it. Patient also endorsed two week history of dizziness upon standing. Patient denies abdominal pain, pain  else where in the body and changes in vision. Patient eating and drinking without difficulties.   Discharge Exam:   BP 105/64 (BP Location: Left Arm)   Pulse 98   Temp 98.4 F (36.9 C) (Oral)   Resp 18   Ht 5\' 9"  (1.753 m)   Wt 75 kg   SpO2 99%   BMI 24.41 kg/m   Discharge Exam:  Gen: well-developed, well-nourished HENT: NCAT, hearing intact Eyes: No scleral icterus, scleral injection present bilaterally Neck: Supple, without lesions, thyroid non-enlarged CV: Mitral regurgitation noted, no rubs, no LEE Pulm: CTAB, no rales, no wheezes Abd: Bowel sounds normal, no tenderness Psychiatric: Oriented x3, intact recent and remote memory, judgement and insight, normal mood and effect  Pertinent Labs, Studies, and Procedures:  CBC Latest Ref Rng & Units 02/16/2021 02/08/2021 01/16/2021  WBC 4.0 -  10.5 K/uL 5.9 5.6 7.8  Hemoglobin 13.0 - 17.0 g/dL 15.0 14.4 12.0(L)  Hematocrit 39.0 - 52.0 % 47.5 44 37.9(L)  Platelets 150 - 400 K/uL 302 278 340    BMP Latest Ref Rng & Units 02/17/2021 02/17/2021 02/16/2021  Glucose 70 - 99 mg/dL 170(H) 177(H) 125(H)  BUN 8 - 23 mg/dL 34(H) 33(H) 38(H)  Creatinine 0.61 - 1.24 mg/dL 1.82(H) 1.80(H) 1.89(H)  BUN/Creat Ratio 10 - 24 - - -  Sodium 135 - 145 mmol/L 138 139 138  Potassium 3.5 - 5.1 mmol/L 3.6 3.5 4.0  Chloride 98 - 111 mmol/L 97(L) 96(L) 96(L)  CO2 22 - 32 mmol/L 33(H) 32 31  Calcium 8.9 - 10.3 mg/dL 10.6(H) 10.8(H) 11.6(H)    Discharge Instructions: Discharge Instructions     Diet - low sodium heart healthy   Complete by: As directed    Increase activity slowly   Complete by: As directed        Signed: Wayland Denis, MD 02/17/2021, 3:35 PM   Pager: [phone]

## 2021-02-18 LAB — PTH, INTACT AND CALCIUM
Calcium, Total (PTH): 10.6 mg/dL — ABNORMAL HIGH (ref 8.6–10.2)
PTH: 9 pg/mL — ABNORMAL LOW (ref 15–65)

## 2021-02-18 LAB — CALCITRIOL (1,25 DI-OH VIT D): Vit D, 1,25-Dihydroxy: 18.3 pg/mL — ABNORMAL LOW (ref 24.8–81.5)

## 2021-02-18 LAB — ANGIOTENSIN CONVERTING ENZYME: Angiotensin-Converting Enzyme: 59 U/L (ref 14–82)

## 2021-02-19 LAB — CALCIUM, IONIZED: Calcium, Ionized, Serum: 5.8 mg/dL — ABNORMAL HIGH (ref 4.5–5.6)

## 2021-02-21 LAB — PROTEIN ELECTROPHORESIS, SERUM
A/G Ratio: 1.4 (ref 0.7–1.7)
Albumin ELP: 3.3 g/dL (ref 2.9–4.4)
Alpha-1-Globulin: 0.2 g/dL (ref 0.0–0.4)
Alpha-2-Globulin: 0.8 g/dL (ref 0.4–1.0)
Beta Globulin: 1 g/dL (ref 0.7–1.3)
Gamma Globulin: 0.4 g/dL (ref 0.4–1.8)
Globulin, Total: 2.4 g/dL (ref 2.2–3.9)
Total Protein ELP: 5.7 g/dL — ABNORMAL LOW (ref 6.0–8.5)

## 2021-02-21 NOTE — Progress Notes (Signed)
PCP: Serita Grammes, MD Cardiology: Dr. [name] HF Cardiology: Dr. Aundra Dubin  80 y.o. with history of prostate cancer, CKD stage 3, CAD, and ischemic cardiomyopathy was referred by Dr. [name] for evaluation of CHF.  Patient developed gradually worsening exertional dyspnea starting early this year.  The shortness of breath has been especially bad for about 1.5 months.  He had an echo done in 4/22, showing EF < 20%, moderate-severe LV dilation, normal RV, severe MR.  LHC/RHC was done showing low cardiac index at 1.81 and occluded mid LAD.  No intervention.  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   TEE was done 6/22, showing EF < 20% with septal-lateral dyssynchrony, mildly decreased RV systolic function, moderate TR, moderate central MR with ERO 0.2 cm^2.   He was seen 6/22 for followup of CHF.  Weight was down 3 lbs, but still swollen with NYHA III symptoms.  Arlyce Harman and torsemide were increased.  He was sent to the ED from MD's office (unclear if it was PCP or Oncology) for hypercalcemia. On arrival Ca was 12. Calcium and Vit D supplements were held and he was discharged the next day. Repeat Ca 10.6.  Today he returns for HF follow up wit his wife. Overall feeling fine. Physical activity limited by neuropathy. Does OK on stationary bike. Struggling with HA for past 3 weeks, responds to tylenol. Denies increasing SOB, CP, dizziness, edema, or PND/Orthopnea. Appetite ok. No fever or chills. Weight at home 165-170 pounds. Taking all medications. 1/2 ppd smoker currently.  Life Vest Interrogation: no events (personally reviewed).  Labs (5/22): K 3.4, creatinine 1.7 => 2.15, AST 123 => 189, ALT 322 =>190, tbili 1.7, Hgb 12, LDL 38 Labs (6/22): Viral hepatitis serologies negative  PMH:  1. Hyperlipidemia 2. HTN 3. CKD stage 3 4. 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  Treated with radiation and currently on  denosumab.   5. Cholecystectomy 6. CAD: LHC in 4/22 with 80% proximal RCA stenosis (nondominant), totally occluded mLAD with collaterals, 60% D1.  No intervention.  7. Chronic systolic CHF: Ischemic cardiomyopathy.   - Echo (4/22) with EF < 20%, moderate-severe LV dilation, normal RV, severe LAE, severe MR.  - RHC (4/22): mean RA 3, PA 39/10, mean PCWP 12, CI 1.81 - Echo (6/22): EF < 20% with septal-lateral dyssynchrony, mildly decreased RV systolic function, moderate TR, moderate central MR with ERO 0.2 cm^2.  8. Mitral regurgitation: Severe on 4/22 echo.  - TEE (6/22) with moderate central MR with ERO 0.2 cm^2 (functional, annular dilatation).  9. Elevated LFTs: Viral hepatitis labs negative.  RUQ [place] (6/22) was not suggestive of cirrhosis, ascites noted.   Social History   Socioeconomic History   Marital status: Married    Spouse name: Not on file   Number of children: Not on file   Years of education: Not on file   Highest education level: Not on file  Occupational History   Not on file  Tobacco Use   Smoking status: Every Day    Packs/day: 0.50    Years: 50.00    Pack years: 25.00    Types: Cigarettes   Smokeless tobacco: Never  Vaping Use   Vaping Use: Never used  Substance and Sexual Activity   Alcohol use: Not Currently   Drug use: Never   Sexual activity: Not on file  Other Topics Concern   Not on file  Social History Narrative   Not on file  Social Determinants of Health   Financial Resource Strain: Not on file  Food Insecurity: Not on file  Transportation Needs: Not on file  Physical Activity: Not on file  Stress: Not on file  Social Connections: Not on file  Intimate Partner Violence: Not on file   Family History  Problem Relation Age of Onset   Leukemia Mother    Prostate cancer Father    Heart attack Brother    Colon cancer Neg Hx    Rectal cancer Neg Hx    Stomach cancer Neg Hx    Esophageal cancer Neg Hx    ROS: All systems reviewed and  negative except as per HPI.   Current Outpatient Medications  Medication Sig Dispense Refill   acetaminophen (TYLENOL) 500 MG tablet Take 500 mg by mouth every 6 (six) hours as needed for moderate pain or headache.     aspirin EC 81 MG tablet Take 1 tablet (81 mg total) by mouth daily. 90 tablet 3   cetirizine (ZYRTEC) 10 MG tablet Take 10 mg by mouth daily as needed for allergies.     cholestyramine light (PREVALITE) 4 GM/DOSE powder Take 4 g by mouth daily.     denosumab (XGEVA) 120 MG/1.7ML SOLN injection Inject 120 mg into the skin every 30 (thirty) days.     digoxin (LANOXIN) 0.125 MG tablet Take 0.5 tablets (0.0625 mg total) by mouth daily. 15 tablet 0   enzalutamide (XTANDI) 40 MG tablet Take 160 mg by mouth daily.     Evolocumab 140 MG/ML SOAJ Inject 140 mg into the skin every 14 (fourteen) days.     metoprolol succinate (TOPROL XL) 25 MG 24 hr tablet Take 0.5 tablets (12.5 mg total) by mouth daily. 45 tablet 3   omeprazole (PRILOSEC) 40 MG capsule Take 40 mg by mouth daily.     ondansetron (ZOFRAN) 4 MG tablet Take 1 tablet (4 mg total) by mouth every 8 (eight) hours as needed for nausea or vomiting. 20 tablet 0   pregabalin (LYRICA) 75 MG capsule Take 75 mg by mouth 3 (three) times daily.     spironolactone (ALDACTONE) 25 MG tablet Take 0.5 tablets (12.5 mg total) by mouth daily.     tamsulosin (FLOMAX) 0.4 MG CAPS capsule Take 0.4 mg by mouth daily.     torsemide (DEMADEX) 20 MG tablet Take 40-60 mg by mouth 2 (two) times daily. Take 60mg  in the morning and 40mg  in the evening.     No current facility-administered medications for this encounter.   Wt Readings from Last 3 Encounters:  02/22/21 78.1 kg (172 lb 3.2 oz)  02/17/21 75 kg (165 lb 4.8 oz)  02/08/21 77.6 kg (171 lb)   BP (!) 104/57   Pulse 75   Wt 78.1 kg (172 lb 3.2 oz)   SpO2 96%   BMI 25.43 kg/m   General:  NAD. No resp difficulty. HEENT: Normal Neck: Supple. No JVD. Carotids 2+ bilat; no bruits. No  lymphadenopathy or thryomegaly appreciated. Cor: PMI nondisplaced. Regular rate & rhythm. No rubs, gallops or murmurs. Lungs: Clear Abdomen: Soft, nontender, nondistended. No hepatosplenomegaly. No bruits or masses. Good bowel sounds. Extremities: No cyanosis, clubbing, rash, edema Neuro: Alert & oriented x 3, cranial nerves grossly intact. Moves all 4 extremities w/o difficulty. Affect pleasant.  Assessment/Plan: 1. CAD: Occluded mid LAD and 80% proximal stenosis in nondominant RCA in 4/22.  No intervention.  No chest pain.  - Continue ASA 81 mg daily.  - Continue Repatha (intolerant  of statins and Zetia).  Good LDL in 5/22.  2. Chronic systolic CHF: Ischemic cardiomyopathy.  Echo in 4/22 with EF < 20%, moderate-severe LV dilation, normal RV, severe LAE, severe MR.  RHC in 4/22 with CI 1.8.  TEE in 6/22 with EF <20%, dyssynchrony, mildly decreased RV systolic function.  On exam, patient is not volume overloaded. He has NYHA class III symptoms.  He has a wide LBBB.  - Continue Toprol XL 12.5 mg daily.  - Continue spironolactone 25 mg daily.  - Continue digoxin 0.0625 mg daily (lower dose with CKD stage III), check level today.   - Continue torsemide to 60 qam/40 qpm.  BMET today. - He was unable to take Jardiance due to nausea, insurance will not cover [place].   - Continue Lifevest for now, repeat echo in 7/22 (scheduled).  If EF is < 35%, I think he would be a good CRT-D candidate.  3. Mitral regurgitation: Severe on 5/22 TTE. However, TEE in 6/22 showed moderate functional central MR.    - MR not bad enough for Mitraclip, may improve with CRT.  4. Elevated LFTs: Noted on recent labs. Viral hepatitis labs negative.  Abdominal US did not show cirrhosis but did show ascites.  Etiology uncertain, ?congestive hepatopathy.  - Recent LFTs ok.  5. CKD stage 3: Follow closely with medication titration.  - Will send labs to Dr. Olivia Mackie. 6. Smoker: 1/2 ppd currently. Encouraged cessation.  Echo  scheduled 03/05/21. If EF<35%, will refer to EP for CRT-D consideration. QRS 155 ms.  Follow up with Mr. Aundra Dubin in 2 months.  [place], FNP-BC 02/22/2021

## 2021-02-22 ENCOUNTER — Other Ambulatory Visit: Payer: Self-pay

## 2021-02-22 ENCOUNTER — Telehealth: Payer: Self-pay | Admitting: Oncology

## 2021-02-22 ENCOUNTER — Ambulatory Visit (HOSPITAL_COMMUNITY)
Admission: RE | Admit: 2021-02-22 | Discharge: 2021-02-22 | Disposition: A | Discharge status: Home / Self Care | Payer: Medicare Other | Source: Ambulatory Visit | Attending: Family Medicine | Admitting: Family Medicine

## 2021-02-22 ENCOUNTER — Encounter (HOSPITAL_COMMUNITY): Payer: Self-pay

## 2021-02-22 VITALS — BP 104/57 | HR 75 | Wt 172.2 lb

## 2021-02-22 DIAGNOSIS — Z7901 Long term (current) use of anticoagulants: Secondary | ICD-10-CM | POA: Diagnosis not present

## 2021-02-22 DIAGNOSIS — I255 Ischemic cardiomyopathy: Secondary | ICD-10-CM | POA: Insufficient documentation

## 2021-02-22 DIAGNOSIS — I2510 Atherosclerotic heart disease of native coronary artery without angina pectoris: Secondary | ICD-10-CM | POA: Insufficient documentation

## 2021-02-22 DIAGNOSIS — N183 Chronic kidney disease, stage 3 unspecified: Secondary | ICD-10-CM | POA: Diagnosis not present

## 2021-02-22 DIAGNOSIS — R519 Headache, unspecified: Secondary | ICD-10-CM | POA: Diagnosis present

## 2021-02-22 DIAGNOSIS — Z79899 Other long term (current) drug therapy: Secondary | ICD-10-CM | POA: Diagnosis not present

## 2021-02-22 DIAGNOSIS — I447 Left bundle-branch block, unspecified: Secondary | ICD-10-CM | POA: Insufficient documentation

## 2021-02-22 DIAGNOSIS — Z7982 Long term (current) use of aspirin: Secondary | ICD-10-CM | POA: Insufficient documentation

## 2021-02-22 DIAGNOSIS — Z720 Tobacco use: Secondary | ICD-10-CM

## 2021-02-22 DIAGNOSIS — I130 Hypertensive heart and chronic kidney disease with heart failure and stage 1 through stage 4 chronic kidney disease, or unspecified chronic kidney disease: Secondary | ICD-10-CM | POA: Insufficient documentation

## 2021-02-22 DIAGNOSIS — I5022 Chronic systolic (congestive) heart failure: Secondary | ICD-10-CM | POA: Diagnosis not present

## 2021-02-22 DIAGNOSIS — G629 Polyneuropathy, unspecified: Secondary | ICD-10-CM | POA: Insufficient documentation

## 2021-02-22 DIAGNOSIS — R7989 Other specified abnormal findings of blood chemistry: Secondary | ICD-10-CM | POA: Insufficient documentation

## 2021-02-22 DIAGNOSIS — R188 Other ascites: Secondary | ICD-10-CM | POA: Diagnosis not present

## 2021-02-22 DIAGNOSIS — I420 Dilated cardiomyopathy: Secondary | ICD-10-CM

## 2021-02-22 DIAGNOSIS — I340 Nonrheumatic mitral (valve) insufficiency: Secondary | ICD-10-CM

## 2021-02-22 DIAGNOSIS — Z8249 Family history of ischemic heart disease and other diseases of the circulatory system: Secondary | ICD-10-CM | POA: Insufficient documentation

## 2021-02-22 DIAGNOSIS — F17210 Nicotine dependence, cigarettes, uncomplicated: Secondary | ICD-10-CM | POA: Diagnosis not present

## 2021-02-22 LAB — BASIC METABOLIC PANEL
Anion gap: 8 (ref 5–15)
BUN: 29 mg/dL — ABNORMAL HIGH (ref 8–23)
CO2: 30 mmol/L (ref 22–32)
Calcium: 9 mg/dL (ref 8.9–10.3)
Chloride: 101 mmol/L (ref 98–111)
Creatinine, Ser: 1.65 mg/dL — ABNORMAL HIGH (ref 0.61–1.24)
GFR, Estimated: 42 mL/min — ABNORMAL LOW (ref >60)
Glucose, Bld: 121 mg/dL — ABNORMAL HIGH (ref 70–99)
Potassium: 3.8 mmol/L (ref 3.5–5.1)
Sodium: 139 mmol/L (ref 135–145)

## 2021-02-22 NOTE — Patient Instructions (Signed)
Routine lab work today. Will notify you of abnormal results  Follow up with Dr.McLean in 2 months  Do the following things EVERYDAY: Weigh yourself in the morning before breakfast. Write it down and keep it in a log. Take your medicines as prescribed Eat low salt foods--Limit salt (sodium) to 2000 mg per day.  Stay as active as you can everyday Limit all fluids for the day to less than 2 liters

## 2021-02-22 NOTE — Telephone Encounter (Signed)
Called pt to reschedule appt per MD  PAL - no answer. Left message for patient with new appt date and time and call back number if needed.

## 2021-02-26 LAB — PTH-RELATED PEPTIDE: PTH-related peptide: <2 pmol/L

## 2021-02-28 ENCOUNTER — Encounter (HOSPITAL_COMMUNITY): Payer: Self-pay | Admitting: *Deleted

## 2021-03-02 ENCOUNTER — Encounter: Payer: Self-pay | Admitting: Oncology

## 2021-03-02 NOTE — Addendum Note (Signed)
Addended by: Juanetta Beets on: 03/02/2021 12:51 PM   Modules accepted: Orders

## 2021-03-05 ENCOUNTER — Ambulatory Visit (INDEPENDENT_AMBULATORY_CARE_PROVIDER_SITE_OTHER): Payer: Medicare Other

## 2021-03-05 ENCOUNTER — Other Ambulatory Visit: Payer: Self-pay

## 2021-03-05 DIAGNOSIS — I5020 Unspecified systolic (congestive) heart failure: Secondary | ICD-10-CM

## 2021-03-05 DIAGNOSIS — I2510 Atherosclerotic heart disease of native coronary artery without angina pectoris: Secondary | ICD-10-CM

## 2021-03-05 DIAGNOSIS — I420 Dilated cardiomyopathy: Secondary | ICD-10-CM | POA: Diagnosis not present

## 2021-03-05 LAB — ECHOCARDIOGRAM COMPLETE
Area-P 1/2: 5.88 cm2
Calc EF: 15.6 %
MV M vel: 4.35 m/s
MV Peak grad: 75.7 mmHg
Radius: 0.8 cm
S' Lateral: 6.1 cm
Single Plane A2C EF: 9.9 %
Single Plane A4C EF: 18.8 %

## 2021-03-05 LAB — ECHO TEE
MV M vel: 4.44 m/s
MV Peak grad: 78.9 mmHg
Radius: 0.6 cm

## 2021-03-05 NOTE — Progress Notes (Signed)
Complete echocardiogram performed.  Jimmy Montgomery Rothlisberger RDCS, RVT  

## 2021-03-06 ENCOUNTER — Other Ambulatory Visit: Payer: Self-pay

## 2021-03-07 ENCOUNTER — Other Ambulatory Visit: Payer: Medicare Other

## 2021-03-07 ENCOUNTER — Ambulatory Visit: Payer: Medicare Other | Admitting: Oncology

## 2021-03-08 NOTE — Progress Notes (Signed)
[facility]  [address] [place],  [place]  [address] [phone]  Clinic Day:  03/09/2021  Referring physician: Serita Grammes, MD  This document serves as a record of services personally performed by Ryhanna Dunsmore Macarthur Critchley, MD. It was created on their behalf by [place] E, a trained medical scribe. The creation of this record is based on the scribe's personal observations and the provider's statements to them.  HISTORY OF PRESENT ILLNESS:  The patient is a 80 y.o. male with metastatic prostate cancer, who is currently taking Trelstar/enzalutamide for his complete androgen blockade therapy.  He also is taking Xgeva to protect his bones against worsening disease metastasis.  He comes in today for routine followup.  Since his last visit, the patient has been doing very well.  He denies having any significant bone pain or other systemic symptoms which concern him for overt progression of his metastatic prostate cancer.  PHYSICAL EXAM:  Blood pressure 123/63, pulse 77, temperature 98.3 F (36.8 C), resp. rate 18, height 5\' 9"  (1.753 m), weight 168 lb 12.8 oz (76.6 kg), SpO2 97 %. Wt Readings from Last 3 Encounters:  03/09/21 169 lb (76.7 kg)  03/09/21 168 lb 12.8 oz (76.6 kg)  02/22/21 172 lb 3.2 oz (78.1 kg)   Body mass index is 24.93 kg/m. Performance status (ECOG): 1 - Symptomatic but completely ambulatory Physical Exam Constitutional:      Appearance: Normal appearance. He is not ill-appearing.     Comments: He has problems with memory recollection  HENT:     Mouth/Throat:     Mouth: Mucous membranes are moist.     Pharynx: Oropharynx is clear. No oropharyngeal exudate or posterior oropharyngeal erythema.  Cardiovascular:     Rate and Rhythm: Normal rate and regular rhythm.     Heart sounds: No murmur heard.   No friction rub. No gallop.  Pulmonary:     Effort: Pulmonary effort is normal. No respiratory distress.     Breath sounds: Normal  breath sounds. No wheezing, rhonchi or rales.  Chest:  Breasts:    Right: No axillary adenopathy or supraclavicular adenopathy.     Left: No axillary adenopathy or supraclavicular adenopathy.  Abdominal:     General: Bowel sounds are normal. There is no distension.     Palpations: Abdomen is soft. There is no mass.     Tenderness: There is no abdominal tenderness.  Musculoskeletal:        General: No swelling.     Right lower leg: No edema.     Left lower leg: No edema.  Lymphadenopathy:     Cervical: No cervical adenopathy.     Upper Body:     Right upper body: No supraclavicular or axillary adenopathy.     Left upper body: No supraclavicular or axillary adenopathy.     Lower Body: No right inguinal adenopathy. No left inguinal adenopathy.  Skin:    General: Skin is warm.     Coloration: Skin is not jaundiced.     Findings: No lesion or rash.  Neurological:     General: No focal deficit present.     Mental Status: He is alert and oriented to person, place, and time. Mental status is at baseline.     Cranial Nerves: Cranial nerves are intact.  Psychiatric:        Mood and Affect: Mood normal.        Behavior: Behavior normal.        Thought  Content: Thought content normal.    LABS:   CBC Latest Ref Rng & Units 03/09/2021 02/16/2021 02/08/2021  WBC - 5.7 5.9 5.6  Hemoglobin 13.5 - 17.5 14.0 15.0 14.4  Hematocrit 41 - 53 42 47.5 44  Platelets 150 - 399 295 302 278   CMP Latest Ref Rng & Units 03/09/2021 02/22/2021 02/17/2021  Glucose 70 - 99 mg/dL - 121(H) 170(H)  BUN 4 - 21 39(A) 29(H) 34(H)  Creatinine 0.6 - 1.3 1.7(A) 1.65(H) 1.82(H)  Sodium 137 - 147 138 139 138  Potassium 3.4 - 5.3 3.1(A) 3.8 3.6  Chloride 99 - 108 97(A) 101 97(L)  CO2 13 - 22 31(A) 30 33(H)  Calcium 8.7 - 10.7 9.8 9.0 10.6(H)  Total Protein 6.5 - 8.1 g/dL - - 5.7(L)  Total Bilirubin 0.3 - 1.2 mg/dL - - 1.1  Alkaline Phos 25 - 125 83 - 69  AST 14 - 40 27 - 28  ALT 10 - 40 15 - 17   Lab Results   Component Value Date   PSA1 <0.1 03/09/2021   ASSESSMENT & PLAN:  Assessment/Plan:  A 80 y.o. male with metastatic prostate cancer.  I am very pleased as his PSA remains at an undetectable level.  This reflects the continued efficacy of his Trelstar/enzalutamide therapy.  He will continue taking this regimen until there is evidence of disease progression.  He will also continue taking Xgeva monthly for protection against worsening metastatic bone disease.  I will see him back in 3 months for repeat clinical assessment.  The patient understands all the plans discussed today and is in agreement with them.     I, Rita Ohara, am acting as scribe for Marice Potter, MD    I have reviewed this report as typed by the medical scribe, and it is complete and accurate.  Calyb Mcquarrie Macarthur Critchley, MD

## 2021-03-09 ENCOUNTER — Other Ambulatory Visit: Payer: Self-pay

## 2021-03-09 ENCOUNTER — Inpatient Hospital Stay: Payer: Medicare Other

## 2021-03-09 ENCOUNTER — Inpatient Hospital Stay (INDEPENDENT_AMBULATORY_CARE_PROVIDER_SITE_OTHER): Payer: Medicare Other | Admitting: Oncology

## 2021-03-09 ENCOUNTER — Other Ambulatory Visit: Payer: Self-pay | Admitting: Oncology

## 2021-03-09 ENCOUNTER — Ambulatory Visit: Payer: Medicare Other | Admitting: Oncology

## 2021-03-09 ENCOUNTER — Encounter: Payer: Self-pay | Admitting: Oncology

## 2021-03-09 ENCOUNTER — Inpatient Hospital Stay: Payer: Medicare Other | Attending: Oncology

## 2021-03-09 ENCOUNTER — Inpatient Hospital Stay: Payer: Medicare Other | Admitting: Oncology

## 2021-03-09 VITALS — BP 123/63 | HR 77 | Temp 98.3°F | Resp 18 | Ht 69.0 in | Wt 168.8 lb

## 2021-03-09 VITALS — BP 133/74 | HR 78 | Temp 98.0°F | Resp 18 | Ht 69.0 in | Wt 169.0 lb

## 2021-03-09 DIAGNOSIS — C7951 Secondary malignant neoplasm of bone: Secondary | ICD-10-CM | POA: Diagnosis present

## 2021-03-09 DIAGNOSIS — C61 Malignant neoplasm of prostate: Secondary | ICD-10-CM

## 2021-03-09 DIAGNOSIS — I420 Dilated cardiomyopathy: Secondary | ICD-10-CM

## 2021-03-09 LAB — HEPATIC FUNCTION PANEL
ALT: 15 (ref 10–40)
AST: 27 (ref 14–40)
Alkaline Phosphatase: 83 (ref 25–125)
Bilirubin, Total: 1.3

## 2021-03-09 LAB — CBC: RBC: 4.89 (ref 3.87–5.11)

## 2021-03-09 LAB — CBC AND DIFFERENTIAL
HCT: 42 (ref 41–53)
Hemoglobin: 14 (ref 13.5–17.5)
Neutrophils Absolute: 3.42
Platelets: 295 (ref 150–399)
WBC: 5.7

## 2021-03-09 LAB — COMPREHENSIVE METABOLIC PANEL WITH GFR
Albumin: 4 (ref 3.5–5.0)
Calcium: 9.8 (ref 8.7–10.7)

## 2021-03-09 LAB — BASIC METABOLIC PANEL
BUN: 39 — AB (ref 4–21)
CO2: 31 — AB (ref 13–22)
Chloride: 97 — AB (ref 99–108)
Creatinine: 1.7 — AB (ref 0.6–1.3)
Glucose: 108
Potassium: 3.1 — AB (ref 3.4–5.3)
Sodium: 138 (ref 137–147)

## 2021-03-09 MED ORDER — DENOSUMAB 120 MG/1.7ML ~~LOC~~ SOLN
120.0000 mg | Freq: Once | SUBCUTANEOUS | Status: AC
  Administered 2021-03-09: 120 mg via SUBCUTANEOUS

## 2021-03-09 MED ORDER — DENOSUMAB 120 MG/1.7ML ~~LOC~~ SOLN
SUBCUTANEOUS | Status: AC
  Filled 2021-03-09: qty 1.7

## 2021-03-09 NOTE — Patient Instructions (Signed)
Denosumab injection What is this medication? DENOSUMAB (den oh sue mab) slows bone breakdown. Prolia is used to treat osteoporosis in women after menopause and in men, and in people who are taking corticosteroids for 6 months or more. Delton See is used to treat a high calcium level due to cancer and to prevent bone fractures and other bone problems caused by multiple myeloma or cancer bone metastases. Delton See is also used totreat giant cell tumor of the bone. This medicine may be used for other purposes; ask your health care provider orpharmacist if you have questions. COMMON BRAND NAME(S): Prolia, XGEVA What should I tell my care team before I take this medication? They need to know if you have any of these conditions: dental disease having surgery or tooth extraction infection kidney disease low levels of calcium or Vitamin D in the blood malnutrition on hemodialysis skin conditions or sensitivity thyroid or parathyroid disease an unusual reaction to denosumab, other medicines, foods, dyes, or preservatives pregnant or trying to get pregnant breast-feeding How should I use this medication? This medicine is for injection under the skin. It is given by a health careprofessional in a hospital or clinic setting. A special MedGuide will be given to you before each treatment. Be sure to readthis information carefully each time. For Prolia, talk to your pediatrician regarding the use of this medicine in children. Special care may be needed. For Delton See, talk to your pediatrician regarding the use of this medicine in children. While this drug may be prescribed for children as young as 13 years for selected conditions,precautions do apply. Overdosage: If you think you have taken too much of this medicine contact apoison control center or emergency room at once. NOTE: This medicine is only for you. Do not share this medicine with others. What if I miss a dose? It is important not to miss your dose. Call  your doctor or health careprofessional if you are unable to keep an appointment. What may interact with this medication? Do not take this medicine with any of the following medications: other medicines containing denosumab This medicine may also interact with the following medications: medicines that lower your chance of fighting infection steroid medicines like prednisone or cortisone This list may not describe all possible interactions. Give your health care provider a list of all the medicines, herbs, non-prescription drugs, or dietary supplements you use. Also tell them if you smoke, drink alcohol, or use illegaldrugs. Some items may interact with your medicine. What should I watch for while using this medication? Visit your doctor or health care professional for regular checks on your progress. Your doctor or health care professional may order blood tests andother tests to see how you are doing. Call your doctor or health care professional for advice if you get a fever, chills or sore throat, or other symptoms of a cold or flu. Do not treat yourself. This drug may decrease your body's ability to fight infection. Try toavoid being around people who are sick. You should make sure you get enough calcium and vitamin D while you are taking this medicine, unless your doctor tells you not to. Discuss the foods you eatand the vitamins you take with your health care professional. See your dentist regularly. Brush and floss your teeth as directed. Before youhave any dental work done, tell your dentist you are receiving this medicine. Do not become pregnant while taking this medicine or for 5 months after stopping it. Talk with your doctor or health care professional about your  birth control options while taking this medicine. Women should inform their doctor if they wish to become pregnant or think they might be pregnant. There is a potential for serious side effects to an unborn child. Talk to your health  careprofessional or pharmacist for more information. What side effects may I notice from receiving this medication? Side effects that you should report to your doctor or health care professionalas soon as possible: allergic reactions like skin rash, itching or hives, swelling of the face, lips, or tongue bone pain breathing problems dizziness jaw pain, especially after dental work redness, blistering, peeling of the skin signs and symptoms of infection like fever or chills; cough; sore throat; pain or trouble passing urine signs of low calcium like fast heartbeat, muscle cramps or muscle pain; pain, tingling, numbness in the hands or feet; seizures unusual bleeding or bruising unusually weak or tired Side effects that usually do not require medical attention (report to yourdoctor or health care professional if they continue or are bothersome): constipation diarrhea headache joint pain loss of appetite muscle pain runny nose tiredness upset stomach This list may not describe all possible side effects. Call your doctor for medical advice about side effects. You may report side effects to FDA at1-800-FDA-1088. Where should I keep my medication? This medicine is only given in a clinic, doctor's office, or other health caresetting and will not be stored at home. NOTE: This sheet is a summary. It may not cover all possible information. If you have questions about this medicine, talk to your doctor, pharmacist, orhealth care provider.  2022 Elsevier/Gold Standard (2017-12-12 16:10:44)

## 2021-03-10 LAB — PROSTATE-SPECIFIC AG, SERUM (LABCORP): Prostate Specific Ag, Serum: <0.1 ng/mL (ref 0.0–4.0)

## 2021-03-12 ENCOUNTER — Ambulatory Visit: Payer: Medicare Other | Admitting: Oncology

## 2021-03-12 ENCOUNTER — Ambulatory Visit: Payer: Medicare Other

## 2021-03-12 ENCOUNTER — Other Ambulatory Visit: Payer: Medicare Other

## 2021-03-14 ENCOUNTER — Encounter: Payer: Self-pay | Admitting: Oncology

## 2021-03-19 ENCOUNTER — Telehealth: Payer: Self-pay | Admitting: Oncology

## 2021-03-19 NOTE — Telephone Encounter (Signed)
Patient's staff called to verify Oct Appt's

## 2021-04-05 ENCOUNTER — Other Ambulatory Visit: Payer: Self-pay | Admitting: Hematology and Oncology

## 2021-04-05 DIAGNOSIS — C61 Malignant neoplasm of prostate: Secondary | ICD-10-CM

## 2021-04-06 ENCOUNTER — Encounter: Payer: Self-pay | Admitting: Hematology and Oncology

## 2021-04-06 ENCOUNTER — Inpatient Hospital Stay: Payer: Medicare Other

## 2021-04-06 ENCOUNTER — Other Ambulatory Visit: Payer: Self-pay

## 2021-04-06 ENCOUNTER — Inpatient Hospital Stay: Payer: Medicare Other | Attending: Oncology

## 2021-04-06 VITALS — BP 98/52 | HR 76 | Temp 98.2°F | Resp 18 | Ht 69.0 in | Wt 175.0 lb

## 2021-04-06 DIAGNOSIS — C61 Malignant neoplasm of prostate: Secondary | ICD-10-CM

## 2021-04-06 DIAGNOSIS — C7951 Secondary malignant neoplasm of bone: Secondary | ICD-10-CM | POA: Insufficient documentation

## 2021-04-06 LAB — CBC AND DIFFERENTIAL
HCT: 40 — AB (ref 41–53)
Hemoglobin: 13.3 — AB (ref 13.5–17.5)
Neutrophils Absolute: 3.83
Platelets: 210 (ref 150–399)
WBC: 6.6

## 2021-04-06 LAB — BASIC METABOLIC PANEL
BUN: 39 — AB (ref 4–21)
CO2: 27 — AB (ref 13–22)
Chloride: 99 (ref 99–108)
Creatinine: 1.9 — AB (ref 0.6–1.3)
Glucose: 118
Potassium: 4.2 (ref 3.4–5.3)
Sodium: 137 (ref 137–147)

## 2021-04-06 LAB — HEPATIC FUNCTION PANEL
ALT: 17 (ref 10–40)
AST: 30 (ref 14–40)
Alkaline Phosphatase: 72 (ref 25–125)
Bilirubin, Total: 1

## 2021-04-06 LAB — CBC: RBC: 4.53 (ref 3.87–5.11)

## 2021-04-06 LAB — COMPREHENSIVE METABOLIC PANEL
Albumin: 3.9 (ref 3.5–5.0)
Calcium: 9.4 (ref 8.7–10.7)

## 2021-04-06 LAB — PSA: Prostatic Specific Antigen: 0.01 ng/mL (ref 0.00–4.00)

## 2021-04-06 MED ORDER — DENOSUMAB 120 MG/1.7ML ~~LOC~~ SOLN
120.0000 mg | Freq: Once | SUBCUTANEOUS | Status: AC
  Administered 2021-04-06: 120 mg via SUBCUTANEOUS
  Filled 2021-04-06: qty 1.7

## 2021-04-06 NOTE — Patient Instructions (Signed)
Denosumab injection What is this medication? DENOSUMAB (den oh sue mab) slows bone breakdown. Prolia is used to treat osteoporosis in women after menopause and in men, and in people who are taking corticosteroids for 6 months or more. Delton See is used to treat a high calcium level due to cancer and to prevent bone fractures and other bone problems caused by multiple myeloma or cancer bone metastases. Delton See is also used totreat giant cell tumor of the bone. This medicine may be used for other purposes; ask your health care provider orpharmacist if you have questions. COMMON BRAND NAME(S): Prolia, XGEVA What should I tell my care team before I take this medication? They need to know if you have any of these conditions: dental disease having surgery or tooth extraction infection kidney disease low levels of calcium or Vitamin D in the blood malnutrition on hemodialysis skin conditions or sensitivity thyroid or parathyroid disease an unusual reaction to denosumab, other medicines, foods, dyes, or preservatives pregnant or trying to get pregnant breast-feeding How should I use this medication? This medicine is for injection under the skin. It is given by a health careprofessional in a hospital or clinic setting. A special MedGuide will be given to you before each treatment. Be sure to readthis information carefully each time. For Prolia, talk to your pediatrician regarding the use of this medicine in children. Special care may be needed. For Delton See, talk to your pediatrician regarding the use of this medicine in children. While this drug may be prescribed for children as young as 13 years for selected conditions,precautions do apply. Overdosage: If you think you have taken too much of this medicine contact apoison control center or emergency room at once. NOTE: This medicine is only for you. Do not share this medicine with others. What if I miss a dose? It is important not to miss your dose. Call  your doctor or health careprofessional if you are unable to keep an appointment. What may interact with this medication? Do not take this medicine with any of the following medications: other medicines containing denosumab This medicine may also interact with the following medications: medicines that lower your chance of fighting infection steroid medicines like prednisone or cortisone This list may not describe all possible interactions. Give your health care provider a list of all the medicines, herbs, non-prescription drugs, or dietary supplements you use. Also tell them if you smoke, drink alcohol, or use illegaldrugs. Some items may interact with your medicine. What should I watch for while using this medication? Visit your doctor or health care professional for regular checks on your progress. Your doctor or health care professional may order blood tests andother tests to see how you are doing. Call your doctor or health care professional for advice if you get a fever, chills or sore throat, or other symptoms of a cold or flu. Do not treat yourself. This drug may decrease your body's ability to fight infection. Try toavoid being around people who are sick. You should make sure you get enough calcium and vitamin D while you are taking this medicine, unless your doctor tells you not to. Discuss the foods you eatand the vitamins you take with your health care professional. See your dentist regularly. Brush and floss your teeth as directed. Before youhave any dental work done, tell your dentist you are receiving this medicine. Do not become pregnant while taking this medicine or for 5 months after stopping it. Talk with your doctor or health care professional about your  birth control options while taking this medicine. Women should inform their doctor if they wish to become pregnant or think they might be pregnant. There is a potential for serious side effects to an unborn child. Talk to your health  careprofessional or pharmacist for more information. What side effects may I notice from receiving this medication? Side effects that you should report to your doctor or health care professionalas soon as possible: allergic reactions like skin rash, itching or hives, swelling of the face, lips, or tongue bone pain breathing problems dizziness jaw pain, especially after dental work redness, blistering, peeling of the skin signs and symptoms of infection like fever or chills; cough; sore throat; pain or trouble passing urine signs of low calcium like fast heartbeat, muscle cramps or muscle pain; pain, tingling, numbness in the hands or feet; seizures unusual bleeding or bruising unusually weak or tired Side effects that usually do not require medical attention (report to yourdoctor or health care professional if they continue or are bothersome): constipation diarrhea headache joint pain loss of appetite muscle pain runny nose tiredness upset stomach This list may not describe all possible side effects. Call your doctor for medical advice about side effects. You may report side effects to FDA at1-800-FDA-1088. Where should I keep my medication? This medicine is only given in a clinic, doctor's office, or other health caresetting and will not be stored at home. NOTE: This sheet is a summary. It may not cover all possible information. If you have questions about this medicine, talk to your doctor, pharmacist, orhealth care provider.  2022 Elsevier/Gold Standard (2017-12-12 16:10:44)

## 2021-04-13 ENCOUNTER — Telehealth: Payer: Self-pay | Admitting: Cardiology

## 2021-04-13 NOTE — Telephone Encounter (Signed)
Spoke with pt's wife who states [employer] will be faixng paperwork to continue life vest. Advised we will complete as soon as received.

## 2021-04-13 NOTE — Telephone Encounter (Signed)
Wife was calling and wanted to speak to [place], Dr. Julien Nordmann Nurse regarding an rx for a life vest he wears daily

## 2021-04-25 ENCOUNTER — Encounter: Payer: Self-pay | Admitting: Oncology

## 2021-04-25 ENCOUNTER — Other Ambulatory Visit: Payer: Self-pay

## 2021-04-25 ENCOUNTER — Encounter (HOSPITAL_COMMUNITY): Payer: Self-pay | Admitting: Cardiology

## 2021-04-25 ENCOUNTER — Ambulatory Visit (HOSPITAL_COMMUNITY)
Admission: RE | Admit: 2021-04-25 | Discharge: 2021-04-25 | Disposition: A | Discharge status: Home / Self Care | Payer: Medicare Other | Source: Ambulatory Visit | Attending: Cardiology | Admitting: Cardiology

## 2021-04-25 ENCOUNTER — Other Ambulatory Visit (HOSPITAL_COMMUNITY): Payer: Self-pay

## 2021-04-25 VITALS — BP 112/60 | HR 66 | Wt 171.8 lb

## 2021-04-25 DIAGNOSIS — R0600 Dyspnea, unspecified: Secondary | ICD-10-CM | POA: Diagnosis present

## 2021-04-25 DIAGNOSIS — F17210 Nicotine dependence, cigarettes, uncomplicated: Secondary | ICD-10-CM | POA: Diagnosis not present

## 2021-04-25 DIAGNOSIS — I255 Ischemic cardiomyopathy: Secondary | ICD-10-CM | POA: Diagnosis not present

## 2021-04-25 DIAGNOSIS — I2510 Atherosclerotic heart disease of native coronary artery without angina pectoris: Secondary | ICD-10-CM | POA: Insufficient documentation

## 2021-04-25 DIAGNOSIS — I340 Nonrheumatic mitral (valve) insufficiency: Secondary | ICD-10-CM | POA: Insufficient documentation

## 2021-04-25 DIAGNOSIS — I130 Hypertensive heart and chronic kidney disease with heart failure and stage 1 through stage 4 chronic kidney disease, or unspecified chronic kidney disease: Secondary | ICD-10-CM | POA: Insufficient documentation

## 2021-04-25 DIAGNOSIS — I447 Left bundle-branch block, unspecified: Secondary | ICD-10-CM | POA: Insufficient documentation

## 2021-04-25 DIAGNOSIS — N183 Chronic kidney disease, stage 3 unspecified: Secondary | ICD-10-CM | POA: Insufficient documentation

## 2021-04-25 DIAGNOSIS — Z7901 Long term (current) use of anticoagulants: Secondary | ICD-10-CM | POA: Insufficient documentation

## 2021-04-25 DIAGNOSIS — Z7982 Long term (current) use of aspirin: Secondary | ICD-10-CM | POA: Diagnosis not present

## 2021-04-25 DIAGNOSIS — I5022 Chronic systolic (congestive) heart failure: Secondary | ICD-10-CM | POA: Diagnosis not present

## 2021-04-25 DIAGNOSIS — R188 Other ascites: Secondary | ICD-10-CM | POA: Insufficient documentation

## 2021-04-25 DIAGNOSIS — Z8249 Family history of ischemic heart disease and other diseases of the circulatory system: Secondary | ICD-10-CM | POA: Insufficient documentation

## 2021-04-25 DIAGNOSIS — Z79899 Other long term (current) drug therapy: Secondary | ICD-10-CM | POA: Insufficient documentation

## 2021-04-25 LAB — BASIC METABOLIC PANEL
Anion gap: 9 (ref 5–15)
BUN: 31 mg/dL — ABNORMAL HIGH (ref 8–23)
CO2: 33 mmol/L — ABNORMAL HIGH (ref 22–32)
Calcium: 9.4 mg/dL (ref 8.9–10.3)
Chloride: 94 mmol/L — ABNORMAL LOW (ref 98–111)
Creatinine, Ser: 1.9 mg/dL — ABNORMAL HIGH (ref 0.61–1.24)
GFR, Estimated: 35 mL/min — ABNORMAL LOW (ref >60)
Glucose, Bld: 129 mg/dL — ABNORMAL HIGH (ref 70–99)
Potassium: 4.3 mmol/L (ref 3.5–5.1)
Sodium: 136 mmol/L (ref 135–145)

## 2021-04-25 LAB — DIGOXIN LEVEL: Digoxin Level: 1.3 ng/mL (ref 0.8–2.0)

## 2021-04-25 MED ORDER — TORSEMIDE 20 MG PO TABS: 40.0000 mg | ORAL_TABLET | Freq: Two times a day (BID) | ORAL | Status: DC

## 2021-04-25 MED ORDER — ENTRESTO 24-26 MG PO TABS: 1.0000 | ORAL_TABLET | Freq: Two times a day (BID) | ORAL | 3 refills | Status: DC

## 2021-04-25 NOTE — Patient Instructions (Addendum)
Start Entresto 24/26 mg Twice daily   Decrease Torsemide to 40 mg (2 tabs) Twice daily   Labs done today, your results will be available in MyChart, we will contact you for abnormal readings.  Your physician recommends that you return for lab work in: 1-2 weeks  Please follow up with our heart failure pharmacist in 3 weeks  You have been referred to EP to discuss getting a defibrillator, they will call you for an appointment  Your physician recommends that you schedule a follow-up appointment in: 2 months  If you have any questions or concerns before your next appointment please send [place] a message through [place] or call our office at [phone].    TO LEAVE A MESSAGE FOR THE NURSE SELECT OPTION 2, PLEASE LEAVE A MESSAGE INCLUDING: YOUR NAME DATE OF BIRTH CALL BACK NUMBER REASON FOR CALL**this is important as we prioritize the call backs  YOU WILL RECEIVE A CALL BACK THE SAME DAY AS LONG AS YOU CALL BEFORE 4:00 PM  At the [facility], you and your health needs are our priority. As part of our continuing mission to provide you with exceptional heart care, we have created designated Provider Care Teams. These Care Teams include your primary Cardiologist (physician) and Advanced Practice Providers (APPs- Physician Assistants and Nurse Practitioners) who all work together to provide you with the care you need, when you need it.   You may see any of the following providers on your designated Care Team at your next follow up: Dr Glori Bickers Dr Loralie Champagne Dr Patrice Paradise, NP Lyda Jester, [place] Ginnie Smart Audry Riles, PharmD   Please be sure to bring in all your medications bottles to every appointment.    Cardioverter Defibrillator Implantation An implantable cardioverter defibrillator (ICD) is a device that identifies and corrects abnormal heart rhythms. Cardioverter defibrillator implantation is a surgery to place an ICD under the  skin in the chest or abdomen. An ICD has a battery, a small computer (pulse generator), and wires (leads) that go into the heart. The ICD detects and corrects two types of dangerous irregular heart rhythms (arrhythmias): A rapid heart rhythm in the lower chambers of the heart (ventricles). This is called ventricular tachycardia. The ventricles contracting in an uncoordinated way. This is called ventricular fibrillation. There are different types of ICDs, and the electrical signals from the ICD can be programmed differently based on the condition being treated. The electrical signals from the ICD can be low-energy pulses, high-energy shocks, or a combination of the two. The low-energy pulses are generally used to restore the heartbeat to normal when it is either too slow (bradycardia) or too fast. These pulses are painless. The high-energy shocks are used to treat abnormal rhythms such as ventricular tachycardia or ventricular fibrillation. This shock may feel like a strong jolt in the chest. Your health care provider may recommend an ICD if you have: Had a ventricular arrhythmia in the past. A damaged heart because of a disease or heart condition. A weakened heart muscle from a heart attack or cardiac arrest. A congenital heart defect. Long QT syndrome, which is a disorder of the heart's electrical system. Brugada syndrome, which is a condition that causes a disruption of the heart's normal rhythm. Tell a health care provider about: Any allergies you have. All medicines you are taking, including vitamins, herbs, eye drops, creams, and over-the-counter medicines. Any problems you or family members have had with anesthetic medicines. Any blood disorders you  have. Any surgeries you have had. Any medical conditions you have. Whether you are pregnant or may be pregnant. What are the risks? Generally, this is a safe procedure. However, problems may occur, including: Infection. Bleeding. Allergic  reactions to medicines used during the procedure. Blood clots. Swelling or bruising. Damage to nearby structures or organs, such as nerves, lungs, blood vessels, or the heart where the ICD leads or pulse generator is implanted. What happens before the procedure? Staying hydrated Follow instructions from your health care provider about hydration, which may include: Up to 2 hours before the procedure - you may continue to drink clear liquids, such as water, clear fruit juice, black coffee, and plain tea.  Eating and drinking restrictions Follow instructions from your health care provider about eating and drinking, which may include: 8 hours before the procedure - stop eating heavy meals or foods, such as meat, fried foods, or fatty foods. 6 hours before the procedure - stop eating light meals or foods, such as toast or cereal. 6 hours before the procedure - stop drinking milk or drinks that contain milk. 2 hours before the procedure - stop drinking clear liquids. Medicines Ask your health care provider about: Changing or stopping your regular medicines. This is especially important if you are taking diabetes medicines or blood thinners. Taking medicines such as aspirin and ibuprofen. These medicines can thin your blood. Do not take these medicines unless your health care provider tells you to take them. Taking over-the-counter medicines, vitamins, herbs, and supplements. Tests You may have an exam or testing. These may include: Blood tests. A test to check the electrical signals in your heart (electrocardiogram, ECG). Imaging tests, such as a chest X-ray. Echocardiogram. This is an ultrasound of your heart to evaluate your heart structures and function. An event monitor or Holter monitor to wear at home. General instructions Do not use any products that contain nicotine or tobacco for at least 4 weeks before the procedure. These products include cigarettes, chewing tobacco, and vaping  devices, such as e-cigarettes. If you need help quitting, ask your health care provider. Ask your health care provider: How your procedure site will be marked. What steps will be taken to help prevent infection. These may include: Removing hair at the surgery site. Washing skin with a germ-killing soap. Taking antibiotic medicine. You may be asked to shower with a germ-killing soap. Plan to have a responsible adult take you home from the hospital or clinic. What happens during the procedure?  Small monitors will be put on your body. They will be used to check your heart rate, blood pressure, and oxygen level. A pair of sticky pads (defibrillator pads) may be placed on your back and chest. These pads are able to pace your heart as needed during the procedure. An IV will be inserted into one of your veins. You will be given one or more of the following: A medicine to help you relax (sedative). A medicine to numb the area (local anesthetic). A medicine to make you fall asleep(general anesthetic). A small incision will be made to create a deep pocket under the skin of your chest or abdomen. Leads will be guided through a blood vessel into your heart and attached to your heart muscles. Depending on the ICD, the leads may go into one ventricle, or they may go into both ventricles and into an upper chamber of the heart. An X-ray machine (fluoroscope) will be used to help guide the leads. The other end of  the leads will be attached to the pulse generator. The pulse generator will be placed into the pocket under the skin. The ICD will be tested, and your health care provider will program the ICD for the condition being treated. The incision will be closed with stitches (sutures), skin glue, adhesive strips, or staples. A bandage (dressing) will be placed over the incision. The procedure may vary among health care providers and hospitals. What happens after the procedure? Your blood pressure, heart  rate, breathing rate, and blood oxygen level will be monitored until you leave the hospital or clinic. Your health care provider will also monitor your ICD to make sure it is working properly. A chest X-ray will be taken to check that the ICD is in the right place. Do not raise the arm on the side of your procedure higher than your shoulder for as long as told by your health care provider. This is usually at least 6 weeks. You may be given an identification card explaining that you have an ICD. You will be given a remote home monitoring device to use with your ICD to allow your device to communicate with your clinic. Summary An implantable cardioverter defibrillator (ICD) is a device that identifies and corrects abnormal heart rhythms. Cardioverter defibrillator implantation is a surgery to place an ICD under the skin in the chest or abdomen. An ICD consists of a battery, a small computer (pulse generator), and wires (leads) that go into the heart. During the procedure, the ICD will be tested, and your health care provider will program the ICD for the condition being treated. After the procedure, a chest X-ray will be taken to check that the ICD is in the right place. This information is not intended to replace advice given to you by your health care provider. Make sure you discuss any questions you have with your health care provider. Document Revised: 02/02/2020 Document Reviewed: 02/02/2020 Elsevier Patient Education  [place].

## 2021-04-25 NOTE — Progress Notes (Signed)
PCP: Serita Grammes, MD Cardiology: Dr. [name] HF Cardiology: Dr. Aundra Dubin  80 y.o. with history of prostate cancer, CKD stage 3, CAD, and ischemic cardiomyopathy was referred by Dr. [name] for evaluation of CHF.  Patient developed gradually worsening exertional dyspnea starting early this year.  The shortness of breath has been especially bad for about 1.5 months.  He had an echo done in 4/22, showing EF < 20%, moderate-severe LV dilation, normal RV, severe MR.  LHC/RHC was done showing low cardiac index at 1.81 and occluded mid LAD.  No intervention.  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   TEE was done 6/22, showing EF < 20% with septal-lateral dyssynchrony, mildly decreased RV systolic function, moderate TR, moderate central MR with ERO 0.2 cm^2.  Echo in 7/22 similarly showed EF 20%, normal RV, moderate MR.   Patient returns for followup of CHF.  Weight  is down 1 lb.  He is somewhat laconic but his wife says that he is doing better overall.  No dyspnea walking on flat ground.  Dyspnea with moderate exertion.  No orthopnea/PND.  No chest pain.  No lightheadedness.  He wants to get CRT-D device placed and Lifevest off so he can make a trip to see family in [place]. Weight down 1 lb.   ECG (personally reviewed): NSR, LBBB (152 msec)  Labs (5/22): K 3.4, creatinine 1.7 => 2.15, AST 123 => 189, ALT 322 =>190, tbili 1.7, Hgb 12, LDL 38 Labs (6/22): Viral hepatitis serologies negative Labs (8/22): K 4.2, creatinine 1.87  PMH:  1. Hyperlipidemia 2. HTN 3. CKD stage 3 4. 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  Treated with radiation and currently on denosumab.   5. Cholecystectomy 6. CAD: LHC in 4/22 with 80% proximal RCA stenosis (nondominant), totally occluded mLAD with collaterals, 60% D1.  No intervention.  7. Chronic systolic CHF: Ischemic cardiomyopathy.   - Echo (4/22) with EF < 20%,  moderate-severe LV dilation, normal RV, severe LAE, severe MR.  - RHC (4/22): mean RA 3, PA 39/10, mean PCWP 12, CI 1.81 - Echo (6/22): EF < 20% with septal-lateral dyssynchrony, mildly decreased RV systolic function, moderate TR, moderate central MR with ERO 0.2 cm^2.  - Echo (7/22): EF <20%, mild LVH, normal RV, moderate MR.  8. Mitral regurgitation: Severe on 4/22 echo.  - TEE (6/22) with moderate central MR with ERO 0.2 cm^2 (functional, annular dilatation).  9. Elevated LFTs: Viral hepatitis labs negative.  RUQ [place] (6/22) was not suggestive of cirrhosis, ascites noted.  10. Hypercalcemia: Due to Ca supplementation in setting of renal dysfunction.  11. Chronic LBBB  Social History   Socioeconomic History   Marital status: Married    Spouse name: Not on file   Number of children: Not on file   Years of education: Not on file   Highest education level: Not on file  Occupational History   Not on file  Tobacco Use   Smoking status: Every Day    Packs/day: 0.50    Years: 50.00    Pack years: 25.00    Types: Cigarettes   Smokeless tobacco: Never  Vaping Use   Vaping Use: Never used  Substance and Sexual Activity   Alcohol use: Not Currently   Drug use: Never   Sexual activity: Not on file  Other Topics Concern   Not on file  Social History Narrative   Not on file   Social Determinants of Health  Financial Resource Strain: Not on file  Food Insecurity: Not on file  Transportation Needs: Not on file  Physical Activity: Not on file  Stress: Not on file  Social Connections: Not on file  Intimate Partner Violence: Not on file   Family History  Problem Relation Age of Onset   Leukemia Mother    Prostate cancer Father    Heart attack Brother    Colon cancer Neg Hx    Rectal cancer Neg Hx    Stomach cancer Neg Hx    Esophageal cancer Neg Hx    ROS: All systems reviewed and negative except as per HPI.   Current Outpatient Medications  Medication Sig Dispense Refill    acetaminophen (TYLENOL) 500 MG tablet Take 500 mg by mouth every 6 (six) hours as needed for moderate pain or headache.     aspirin EC 81 MG tablet Take 1 tablet (81 mg total) by mouth daily. 90 tablet 3   cetirizine (ZYRTEC) 10 MG tablet Take 10 mg by mouth daily as needed for allergies.     cholestyramine light (PREVALITE) 4 GM/DOSE powder Take 4 g by mouth daily.     denosumab (XGEVA) 120 MG/1.7ML SOLN injection Inject 120 mg into the skin every 30 (thirty) days.     digoxin (LANOXIN) 0.125 MG tablet Take 0.5 tablets (0.0625 mg total) by mouth daily. 15 tablet 0   enzalutamide (XTANDI) 40 MG tablet Take 160 mg by mouth daily.     Evolocumab 140 MG/ML SOAJ Inject 140 mg into the skin every 14 (fourteen) days.     metoprolol succinate (TOPROL XL) 25 MG 24 hr tablet Take 0.5 tablets (12.5 mg total) by mouth daily. 45 tablet 3   omeprazole (PRILOSEC) 40 MG capsule Take 40 mg by mouth daily.     ondansetron (ZOFRAN) 4 MG tablet Take 1 tablet (4 mg total) by mouth every 8 (eight) hours as needed for nausea or vomiting. 20 tablet 0   pregabalin (LYRICA) 75 MG capsule Take 75 mg by mouth 3 (three) times daily.     sacubitril-valsartan (ENTRESTO) 24-26 MG Take 1 tablet by mouth 2 (two) times daily. 60 tablet 3   spironolactone (ALDACTONE) 25 MG tablet Take 0.5 tablets (12.5 mg total) by mouth daily.     tamsulosin (FLOMAX) 0.4 MG CAPS capsule Take 0.4 mg by mouth daily.     torsemide (DEMADEX) 20 MG tablet Take 2 tablets (40 mg total) by mouth 2 (two) times daily. 60 tablet    No current facility-administered medications for this encounter.   BP 112/60   Pulse 66   Wt 77.9 kg (171 lb 12.8 oz)   SpO2 97%   BMI 25.37 kg/m  General: NAD Neck: No JVD, no thyromegaly or thyroid nodule.  Lungs: Clear to auscultation bilaterally with normal respiratory effort. CV: Nondisplaced PMI.  Heart regular S1/S2, no S3/S4, no murmur.  No peripheral edema.  No carotid bruit.  Normal pedal pulses.  Abdomen:  Soft, nontender, no hepatosplenomegaly, no distention.  Skin: Intact without lesions or rashes.  Neurologic: Alert and oriented x 3.  Psych: Normal affect. Extremities: No clubbing or cyanosis.  HEENT: Normal.   Assessment/Plan: 1. CAD: Occluded mid LAD and 80% proximal stenosis in nondominant RCA in 4/22.  No intervention.  No chest pain.  - Continue ASA 81 mg daily.  - Continue Repatha (intolerant of statins and Zetia).  Good LDL in 5/22.  2. Chronic systolic CHF: Ischemic cardiomyopathy.  Echo in 4/22 with  EF < 20%, moderate-severe LV dilation, normal RV, severe LAE, severe MR.  RHC in 4/22 with CI 1.8.  TEE in 6/22 with EF <20%, dyssynchrony, mildly decreased RV systolic function.  Echo in 7/22 with EF < 20%, moderate MR.  NYHA class II, he is not volume overloaded on exam. He has a wide LBBB.   - Continue Toprol XL 12.5 mg daily.  - He was unable to take Jardiance due to nausea, insurance will not cover [place].   - Continue spironolactone 25 mg daily.  - Continue digoxin 0.0625 mg daily (lower dose with CKD stage III), check level today.   - I am going to try him on Entresto 24/26 bid. BMET today and in 10 days.  - With Entresto, will decrease torsemide to 40 mg bid.  - He is currently wearing a Lifevest. Echo in 7/22 with EF < 20%.  I think that he would be a good CRT-D candidate, I will refer to EP for consideration.   3. Mitral regurgitation: Severe on 5/22 TTE. However, TEE in 6/22 showed moderate functional central MR.    - MR not bad enough for Mitraclip, may improve with CRT.  4. Elevated LFTs: Viral hepatitis labs negative.  Abdominal US did not show cirrhosis but did show ascites.  Etiology uncertain, ?congestive hepatopathy.  - Repeat LFTs in 8/22 were normal.  5. CKD stage 3: Follow closely with medication titration  6. Smoking: Active, encouraged him again to quit.   Followup in 2 months with me.   Loralie Champagne 04/25/2021

## 2021-04-26 ENCOUNTER — Telehealth (HOSPITAL_COMMUNITY): Payer: Self-pay | Admitting: *Deleted

## 2021-04-26 DIAGNOSIS — I5022 Chronic systolic (congestive) heart failure: Secondary | ICD-10-CM

## 2021-04-26 MED ORDER — DIGOXIN 125 MCG PO TABS: 0.0625 mg | ORAL_TABLET | ORAL | 0 refills | Status: DC

## 2021-04-26 NOTE — Telephone Encounter (Signed)
-----   Message from Larey Dresser, MD sent at 04/25/2021  1:49 PM EDT ----- Change digoxin to 0.0625 every other day.  Digoxin level in 1 week.

## 2021-04-26 NOTE — Telephone Encounter (Signed)
Tommy Ward, [place]  04/26/2021 10:31 AM EDT Back to Top    Pts wife returned call she is aware of med change and lab added to scheduled lab appt 9/19.    Kerry Dory, [place]  04/25/2021  4:59 PM EDT     Patient called.  Left message for patient to call back. [phone] Jerilynn Mages)   Larey Dresser, MD  04/25/2021  1:49 PM EDT     Change digoxin to 0.0625 every other day.  Digoxin level in 1 week.

## 2021-04-27 ENCOUNTER — Encounter (HOSPITAL_COMMUNITY): Payer: Self-pay

## 2021-04-30 ENCOUNTER — Telehealth: Payer: Self-pay | Admitting: Cardiology

## 2021-04-30 ENCOUNTER — Other Ambulatory Visit: Payer: Self-pay

## 2021-04-30 NOTE — Telephone Encounter (Signed)
Per Madelaine Bhat with [employer] the order was accidentally cancelled and is being reinstated. She will reach out to Ms Demeo and advise her as well.

## 2021-04-30 NOTE — Telephone Encounter (Signed)
Patient's wife is requesting to speak with Dr. Julien Nordmann nurse. Patient's wife states she e-mailed RN regarding life vest renewal.

## 2021-05-01 ENCOUNTER — Telehealth (HOSPITAL_COMMUNITY): Payer: Self-pay | Admitting: *Deleted

## 2021-05-01 MED ORDER — LOSARTAN POTASSIUM 25 MG PO TABS: 12.5000 mg | ORAL_TABLET | Freq: Every day | ORAL | 3 refills | Status: DC

## 2021-05-01 NOTE — Telephone Encounter (Signed)
[place], Maricela Bo, FNP     Stop Delene Loll. Start losartan 12.5 mg every evening. Continue to weigh daily. If weight increases  3lbs in 24hrs or 5 lbs in a week, will need to increase torsemide.

## 2021-05-02 ENCOUNTER — Other Ambulatory Visit: Payer: Self-pay

## 2021-05-02 ENCOUNTER — Ambulatory Visit (INDEPENDENT_AMBULATORY_CARE_PROVIDER_SITE_OTHER): Payer: Medicare Other | Admitting: Cardiology

## 2021-05-02 ENCOUNTER — Encounter: Payer: Self-pay | Admitting: Cardiology

## 2021-05-02 VITALS — BP 112/70 | HR 76 | Ht 69.0 in | Wt 175.4 lb

## 2021-05-02 DIAGNOSIS — I10 Essential (primary) hypertension: Secondary | ICD-10-CM | POA: Diagnosis not present

## 2021-05-02 DIAGNOSIS — E7849 Other hyperlipidemia: Secondary | ICD-10-CM

## 2021-05-02 DIAGNOSIS — I420 Dilated cardiomyopathy: Secondary | ICD-10-CM

## 2021-05-02 DIAGNOSIS — I2510 Atherosclerotic heart disease of native coronary artery without angina pectoris: Secondary | ICD-10-CM | POA: Diagnosis not present

## 2021-05-02 DIAGNOSIS — N183 Chronic kidney disease, stage 3 unspecified: Secondary | ICD-10-CM

## 2021-05-02 DIAGNOSIS — I129 Hypertensive chronic kidney disease with stage 1 through stage 4 chronic kidney disease, or unspecified chronic kidney disease: Secondary | ICD-10-CM

## 2021-05-02 DIAGNOSIS — I5022 Chronic systolic (congestive) heart failure: Secondary | ICD-10-CM

## 2021-05-02 DIAGNOSIS — I5020 Unspecified systolic (congestive) heart failure: Secondary | ICD-10-CM

## 2021-05-02 NOTE — Progress Notes (Signed)
Cardiology Office Note:    Date:  05/02/2021   ID:  Tommy Ward, DOB 04-23-1941, MRN [id number]  PCP:  Serita Grammes, MD  Cardiologist:  Jenean Lindau, MD   Referring MD: Serita Grammes, MD    ASSESSMENT:    1. Benign hypertension with chronic kidney disease, stage III ([place])   2. Coronary artery disease involving native coronary artery of native heart without angina pectoris   3. Essential hypertension   4. Familial hyperlipidemia   5. Dilated cardiomyopathy (HCC)    PLAN:    In order of problems listed above:  Coronary artery disease: Secondary prevention stressed with the patient.  Importance of compliance with diet medication stressed any vocalized understanding. Advanced cardiomyopathy with severely depressed ejection fraction: I discussed findings with the patient at length.  Echocardiogram report done a couple of months ago was reviewed and there has not been much improvement in left ventricular systolic function he sees her heart failure specialist and has been referred to Dr. Curt Bears for electrophysiology evaluation.  It is to be noted that patient has multiple comorbidities including prostate cancer and significant renal insufficiency.  Diet was emphasized weight checks were recommended and he does that well.  He has a LifeVest at this time for primary prevention of sudden cardiac death. Essential hypertension: Blood pressure stable and lifestyle.  Modification emphasized. Renal insufficiency and mixed dyslipidemia: He seems to be taking good care of himself and numbers were noted.  He sees her partners at the lipid clinic. Patient will be seen in follow-up appointment in 6 months or earlier if the patient has any concerns    Medication Adjustments/Labs and Tests Ordered: Current medicines are reviewed at length with the patient today.  Concerns regarding medicines are outlined above.  No orders of the defined types were placed in this encounter.  No orders  of the defined types were placed in this encounter.    No chief complaint on file.    History of Present Illness:    Tommy Ward is a 80 y.o. male.  Patient has past medical history of coronary artery disease, essential hypertension, mixed dyslipidemia, advanced cardiomyopathy.  He denies any problems at this time and takes care of activities of daily living.  No chest pain orthopnea or PND.  He has prostate cancer for which she sees hematology oncology specialist.  At the time of my evaluation, the patient is alert awake oriented and in no distress.  Past Medical History:  Diagnosis Date   Abnormal stress test 12/05/2020   AKI (acute kidney injury) ([place]) 11/25/2019   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[place]) 08/24/2019   Aortic valve disorder 07/08/2014   B12 deficiency 03/22/2018   Benign hypertension with chronic kidney disease, stage III ([place]) 05/20/2019   CAD (coronary artery disease) 11/23/2020   Cancer ([place]) 08/2008   prostate ca   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   Cataract 11/2013   bilateral   Chronic renal insufficiency, stage III (moderate) (HCC) 64/33/2951   Chronic systolic heart failure (HCC)    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 02/24/2019   Coronary arteriosclerosis 07/06/2012   Decreased cardiac ejection fraction 05/20/2019   Depressed left ventricular ejection fraction 12/05/2020   Dilated cardiomyopathy ([place]) 12/05/2020   Essential hypertension 07/06/2012   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   Gastroesophageal reflux disease without esophagitis 03/22/2018   GERD (gastroesophageal reflux disease)    HFrEF (heart failure with reduced ejection fraction) ([place]) 11/23/2020   Hypercalcemia of malignancy  02/16/2021   Hyperkalemia 08/24/2019   Hyperlipidemia    Hyperphosphatemia 11/25/2019   Hypertension    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   Left bundle branch block 07/06/2012   Malignant neoplasm of  prostate ([place])    Malignant neoplasm of prostate ([place])    Metabolic bone disease 60/05/9322   Mixed hyperlipidemia 07/06/2012   Nonrheumatic mitral valve regurgitation 12/05/2020   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   Olecranon bursitis of left elbow 06/25/2018   Pre-diabetes 12/05/2020   Pulmonary hypertension ([place]) 12/05/2020   S/P radiation therapy > 12 wks ago 2010   prostate CA   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[place])    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[place]) 12/05/2020   Tobacco use 12/05/2020   Vitamin D deficiency 08/24/2019    Past Surgical History:  Procedure Laterality Date   CHOLECYSTECTOMY  01/2003   lap chole   left hip repaired     RIGHT/LEFT HEART CATH AND CORONARY ANGIOGRAPHY N/A 12/08/2020   Procedure: RIGHT/LEFT HEART CATH AND CORONARY ANGIOGRAPHY;  Surgeon: Wellington Hampshire, MD;  Location: [facility];  Service: Cardiovascular;  Laterality: N/A;   TEE WITHOUT CARDIOVERSION N/A 01/26/2021   Procedure: TRANSESOPHAGEAL ECHOCARDIOGRAM (TEE);  Surgeon: Larey Dresser, MD;  Location: [facility] ENDOSCOPY;  Service: Cardiovascular;  Laterality: N/A;    Current Medications: Current Meds  Medication Sig   acetaminophen (TYLENOL) 500 MG tablet Take 500 mg by mouth every 6 (six) hours as needed for moderate pain or headache.   aspirin EC 81 MG tablet Take 1 tablet (81 mg total) by mouth daily.   cetirizine (ZYRTEC) 10 MG tablet Take 10 mg by mouth daily as needed for allergies.   cholestyramine light (PREVALITE) 4 GM/DOSE powder Take 4 g by mouth daily.   denosumab (XGEVA) 120 MG/1.7ML SOLN injection Inject 120 mg into the skin every 30 (thirty) days.   digoxin (LANOXIN) 0.125 MG tablet Take 0.5 tablets (0.0625 mg total) by mouth every other day.   enzalutamide (XTANDI) 40 MG tablet Take 160 mg by mouth daily.   Evolocumab 140 MG/ML SOAJ Inject 140 mg into the skin every 14 (fourteen) days.   losartan (COZAAR) 25 MG tablet Take 0.5  tablets (12.5 mg total) by mouth at bedtime.   metoprolol succinate (TOPROL XL) 25 MG 24 hr tablet Take 0.5 tablets (12.5 mg total) by mouth daily.   omeprazole (PRILOSEC) 40 MG capsule Take 40 mg by mouth daily.   ondansetron (ZOFRAN) 4 MG tablet Take 1 tablet (4 mg total) by mouth every 8 (eight) hours as needed for nausea or vomiting.   pregabalin (LYRICA) 100 MG capsule Take 100 mg by mouth 2 (two) times daily as needed for pain.   pregabalin (LYRICA) 75 MG capsule Take 75 mg by mouth 3 (three) times daily.   spironolactone (ALDACTONE) 25 MG tablet Take 0.5 tablets (12.5 mg total) by mouth daily.   tamsulosin (FLOMAX) 0.4 MG CAPS capsule Take 0.4 mg by mouth daily.   torsemide (DEMADEX) 20 MG tablet Take 2 tablets (40 mg total) by mouth 2 (two) times daily.     Allergies:   Ezetimibe, Gabapentin, and Statins   Social History   Socioeconomic History   Marital status: Married    Spouse name: Not on file   Number of children: Not on file   Years of education: Not on file   Highest education level: Not on file  Occupational History   Not on file  Tobacco Use   Smoking status: Every Day    Packs/day: 0.50    Years: 50.00    Pack years: 25.00    Types: Cigarettes   Smokeless tobacco: Never  Vaping Use   Vaping Use: Never used  Substance and Sexual Activity   Alcohol use: Not Currently   Drug use: Never   Sexual activity: Not on file  Other Topics Concern   Not on file  Social History Narrative   Not on file   Social Determinants of Health   Financial Resource Strain: Not on file  Food Insecurity: Not on file  Transportation Needs: Not on file  Physical Activity: Not on file  Stress: Not on file  Social Connections: Not on file     Family History: The patient's family history includes Heart attack in his brother; Leukemia in his mother; Prostate cancer in his father. There is no history of Colon cancer, Rectal cancer, Stomach cancer, or Esophageal cancer.  ROS:    Please see the history of present illness.    All other systems reviewed and are negative.   EKGs/Labs/Other Studies Reviewed:    The following studies were reviewed today: I discussed my findings with the patient at length.   Recent Labs: 02/16/2021: Magnesium 2.4 04/06/2021: ALT 17; Hemoglobin 13.3; Platelets 210 04/25/2021: BUN 31; Creatinine, Ser 1.90; Potassium 4.3; Sodium 136  Recent Lipid Panel    Component Value Date/Time   CHOL 111 01/16/2021 1511   CHOL 400 (H) 02/25/2019 0905   TRIG 76 01/16/2021 1511   HDL 58 01/16/2021 1511   HDL 51 02/25/2019 0905   CHOLHDL 1.9 01/16/2021 1511   VLDL 15 01/16/2021 1511   LDLCALC 38 01/16/2021 1511   LDLCALC 290 (H) 02/25/2019 0905    Physical Exam:    VS:  BP 112/70   Pulse 76   Ht 5\' 9"  (1.753 m)   Wt 175 lb 6.4 oz (79.6 kg)   SpO2 95%   BMI 25.90 kg/m     Wt Readings from Last 3 Encounters:  05/02/21 175 lb 6.4 oz (79.6 kg)  04/25/21 171 lb 12.8 oz (77.9 kg)  04/06/21 175 lb (79.4 kg)     GEN: Patient is in no acute distress HEENT: Normal NECK: No JVD; No carotid bruits LYMPHATICS: No lymphadenopathy CARDIAC: Hear sounds regular, 2/6 systolic murmur at the apex. RESPIRATORY:  Clear to auscultation without rales, wheezing or rhonchi  ABDOMEN: Soft, non-tender, non-distended MUSCULOSKELETAL:  No edema; No deformity  SKIN: Warm and dry NEUROLOGIC:  Alert and oriented x 3 PSYCHIATRIC:  Normal affect   Signed, Jenean Lindau, MD  05/02/2021 10:12 AM    [place]

## 2021-05-02 NOTE — Addendum Note (Signed)
Encounter addended by: Truddie Hidden, RN on: 05/02/2021 11:28 AM  Actions taken: Vitals modified

## 2021-05-02 NOTE — Patient Instructions (Signed)
Medication Instructions:  No medication changes. *If you need a refill on your cardiac medications before your next appointment, please call your pharmacy*   Lab Work: Your physician recommends that you have labs done in the office today. Your test is for basic metabolic panel.  If you have labs (blood work) drawn today and your tests are completely normal, you will receive your results only by: [place] (if you have MyChart) OR A paper copy in the mail If you have any lab test that is abnormal or we need to change your treatment, we will call you to review the results.   Testing/Procedures: None ordered   Follow-Up: At [facility], you and your health needs are our priority.  As part of our continuing mission to provide you with exceptional heart care, we have created designated Provider Care Teams.  These Care Teams include your primary Cardiologist (physician) and Advanced Practice Providers (APPs -  Physician Assistants and Nurse Practitioners) who all work together to provide you with the care you need, when you need it.  We recommend signing up for the patient portal called "MyChart".  Sign up information is provided on this After Visit Summary.  MyChart is used to connect with patients for Virtual Visits (Telemedicine).  Patients are able to view lab/test results, encounter notes, upcoming appointments, etc.  Non-urgent messages can be sent to your provider as well.   To learn more about what you can do with MyChart, go to [email or web address].    Your next appointment:   6 month(s)  The format for your next appointment:   In Person  Provider:   Jyl Heinz, MD   Other Instructions NA

## 2021-05-03 ENCOUNTER — Ambulatory Visit (INDEPENDENT_AMBULATORY_CARE_PROVIDER_SITE_OTHER): Payer: Medicare Other | Admitting: Cardiology

## 2021-05-03 ENCOUNTER — Encounter: Payer: Self-pay | Admitting: Cardiology

## 2021-05-03 ENCOUNTER — Other Ambulatory Visit (HOSPITAL_COMMUNITY): Payer: Self-pay | Admitting: *Deleted

## 2021-05-03 VITALS — BP 124/64 | HR 71 | Ht 69.0 in | Wt 176.0 lb

## 2021-05-03 DIAGNOSIS — N183 Chronic kidney disease, stage 3 unspecified: Secondary | ICD-10-CM

## 2021-05-03 DIAGNOSIS — I2510 Atherosclerotic heart disease of native coronary artery without angina pectoris: Secondary | ICD-10-CM | POA: Diagnosis not present

## 2021-05-03 DIAGNOSIS — I5022 Chronic systolic (congestive) heart failure: Secondary | ICD-10-CM | POA: Diagnosis not present

## 2021-05-03 DIAGNOSIS — I10 Essential (primary) hypertension: Secondary | ICD-10-CM | POA: Diagnosis not present

## 2021-05-03 LAB — BASIC METABOLIC PANEL
BUN/Creatinine Ratio: 17 (ref 10–24)
BUN: 31 mg/dL — ABNORMAL HIGH (ref 8–27)
CO2: 26 mmol/L (ref 20–29)
Calcium: 9.5 mg/dL (ref 8.6–10.2)
Chloride: 99 mmol/L (ref 96–106)
Creatinine, Ser: 1.81 mg/dL — ABNORMAL HIGH (ref 0.76–1.27)
Glucose: 108 mg/dL — ABNORMAL HIGH (ref 65–99)
Potassium: 4.9 mmol/L (ref 3.5–5.2)
Sodium: 141 mmol/L (ref 134–144)
eGFR: 38 mL/min/{1.73_m2} — ABNORMAL LOW (ref >59)

## 2021-05-03 NOTE — Patient Instructions (Signed)
Medication Instructions:  Your physician recommends that you continue on your current medications as directed. Please refer to the Current Medication list given to you today.  *If you need a refill on your cardiac medications before your next appointment, please call your pharmacy*   Lab Work: CBC and BMET today  If you have labs (blood work) drawn today and your tests are completely normal, you will receive your results only by: [place] (if you have MyChart) OR A paper copy in the mail If you have any lab test that is abnormal or we need to change your treatment, we will call you to review the results.   Testing/Procedures: CRT or cardiac resynchronization therapy is a treatment used to correct heart failure. When you have heart failure your heart is weakened and doesn't pump as well as it should. This therapy may help reduce symptoms and improve the quality of life.  Please see the handout/brochure given to you today to get more information of the different options of therapy.    Follow-Up: At [facility], you and your health needs are our priority.  As part of our continuing mission to provide you with exceptional heart care, we have created designated Provider Care Teams.  These Care Teams include your primary Cardiologist (physician) and Advanced Practice Providers (APPs -  Physician Assistants and Nurse Practitioners) who all work together to provide you with the care you need, when you need it.  We recommend signing up for the patient portal called "MyChart".  Sign up information is provided on this After Visit Summary.  MyChart is used to connect with patients for Virtual Visits (Telemedicine).  Patients are able to view lab/test results, encounter notes, upcoming appointments, etc.  Non-urgent messages can be sent to your provider as well.   To learn more about what you can do with MyChart, go to [email or web address].    Your next appointment:   To be scheduled

## 2021-05-03 NOTE — Progress Notes (Signed)
Electrophysiology Office Note:    Date:  05/03/2021   ID:  Tommy Ward, DOB Feb 15, 1941, MRN [id number]  PCP:  Serita Grammes, MD  [facility] Cardiologist:  Pixie Casino, MD  [facility] Electrophysiologist:  Vickie Epley, MD   Referring MD: Larey Dresser, MD   Chief Complaint: Ischemic cardiomyopathy  History of Present Illness:    Tommy Ward is a 80 y.o. male who presents for an evaluation of ischemic cardiomyopathy at the request of Dr. Aundra Dubin. 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  The patient was last seen by Dr. Aundra Dubin on April 25, 2021.  At that appointment the patient expressed interest in discontinuing the LifeVest and getting a CRT-D implanted.  He is hoping to make a trip to [place] to see family.  He is with his wife today.  Past Medical History:  Diagnosis Date   Abnormal stress test 12/05/2020   AKI (acute kidney injury) ([place]) 11/25/2019   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[place]) 08/24/2019   Aortic valve disorder 07/08/2014   B12 deficiency 03/22/2018   Benign hypertension with chronic kidney disease, stage III ([place]) 05/20/2019   CAD (coronary artery disease) 11/23/2020   Cancer ([place]) 08/2008   prostate ca   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   Cataract 11/2013   bilateral   Chronic renal insufficiency, stage III (moderate) (HCC) 19/50/9326   Chronic systolic heart failure (HCC)    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 02/24/2019   Coronary arteriosclerosis 07/06/2012   Decreased cardiac ejection fraction 05/20/2019   Depressed left ventricular ejection fraction 12/05/2020   Dilated cardiomyopathy ([place]) 12/05/2020   Essential hypertension 07/06/2012   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   Gastroesophageal reflux disease without esophagitis 03/22/2018   GERD (gastroesophageal reflux disease)    HFrEF (heart failure with reduced  ejection fraction) ([place]) 11/23/2020   Hypercalcemia of malignancy 02/16/2021   Hyperkalemia 08/24/2019   Hyperlipidemia    Hyperphosphatemia 11/25/2019   Hypertension    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   Left bundle branch block 07/06/2012   Malignant neoplasm of prostate ([place])    Malignant neoplasm of prostate ([place])    Metabolic bone disease 71/24/5809   Mixed hyperlipidemia 07/06/2012   Nonrheumatic mitral valve regurgitation 12/05/2020   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   Olecranon bursitis of left elbow 06/25/2018   Pre-diabetes 12/05/2020   Pulmonary hypertension ([place]) 12/05/2020   S/P radiation therapy > 12 wks ago 2010   prostate CA   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[place])    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[place]) 12/05/2020   Tobacco use 12/05/2020   Vitamin D deficiency 08/24/2019    Past Surgical History:  Procedure Laterality Date   CHOLECYSTECTOMY  01/2003   lap chole   left hip repaired     RIGHT/LEFT HEART CATH AND CORONARY ANGIOGRAPHY N/A 12/08/2020   Procedure: RIGHT/LEFT HEART CATH AND CORONARY ANGIOGRAPHY;  Surgeon: Wellington Hampshire, MD;  Location: [place] CV LAB;  Service: Cardiovascular;  Laterality: N/A;   TEE WITHOUT CARDIOVERSION N/A 01/26/2021   Procedure: TRANSESOPHAGEAL ECHOCARDIOGRAM (TEE);  Surgeon: Larey Dresser, MD;  Location: [facility] ENDOSCOPY;  Service: Cardiovascular;  Laterality: N/A;    Current Medications: Current Meds  Medication Sig   acetaminophen (TYLENOL) 500 MG tablet Take 500 mg by mouth every 6 (six) hours as needed for moderate pain or headache.   aspirin EC 81  MG tablet Take 1 tablet (81 mg total) by mouth daily.   cetirizine (ZYRTEC) 10 MG tablet Take 10 mg by mouth daily as needed for allergies.   cholestyramine light (PREVALITE) 4 GM/DOSE powder Take 4 g by mouth daily.   denosumab (XGEVA) 120 MG/1.7ML SOLN injection Inject 120 mg into the skin every  30 (thirty) days.   digoxin (LANOXIN) 0.125 MG tablet Take 0.5 tablets (0.0625 mg total) by mouth every other day.   enzalutamide (XTANDI) 40 MG tablet Take 160 mg by mouth daily.   Evolocumab 140 MG/ML SOAJ Inject 140 mg into the skin every 14 (fourteen) days.   losartan (COZAAR) 25 MG tablet Take 0.5 tablets (12.5 mg total) by mouth at bedtime.   metoprolol succinate (TOPROL XL) 25 MG 24 hr tablet Take 0.5 tablets (12.5 mg total) by mouth daily.   omeprazole (PRILOSEC) 40 MG capsule Take 40 mg by mouth daily.   ondansetron (ZOFRAN) 4 MG tablet Take 1 tablet (4 mg total) by mouth every 8 (eight) hours as needed for nausea or vomiting.   pregabalin (LYRICA) 100 MG capsule Take 100 mg by mouth 2 (two) times daily.   spironolactone (ALDACTONE) 25 MG tablet Take 0.5 tablets (12.5 mg total) by mouth daily.   tamsulosin (FLOMAX) 0.4 MG CAPS capsule Take 0.4 mg by mouth daily.   torsemide (DEMADEX) 20 MG tablet Take 2 tablets (40 mg total) by mouth 2 (two) times daily.     Allergies:   Ezetimibe, Gabapentin, and Statins   Social History   Socioeconomic History   Marital status: Married    Spouse name: Not on file   Number of children: Not on file   Years of education: Not on file   Highest education level: Not on file  Occupational History   Not on file  Tobacco Use   Smoking status: Every Day    Packs/day: 0.50    Years: 50.00    Pack years: 25.00    Types: Cigarettes   Smokeless tobacco: Never  Vaping Use   Vaping Use: Never used  Substance and Sexual Activity   Alcohol use: Not Currently   Drug use: Never   Sexual activity: Not on file  Other Topics Concern   Not on file  Social History Narrative   Not on file   Social Determinants of Health   Financial Resource Strain: Not on file  Food Insecurity: Not on file  Transportation Needs: Not on file  Physical Activity: Not on file  Stress: Not on file  Social Connections: Not on file     Family History: The patient's  family history includes Heart attack in his brother; Leukemia in his mother; Prostate cancer in his father. There is no history of Colon cancer, Rectal cancer, Stomach cancer, or Esophageal cancer.  ROS:   Please see the history of present illness.    All other systems reviewed and are negative.  EKGs/Labs/Other Studies Reviewed:    The following studies were reviewed today:  March 05, 2021 echo personally reviewed Left ventricular function severely decreased, less than 20% Right ventricular function normal Dilated left atrium Moderate MR  04/25/2021 ECG Left bundle branch block, QRS duration 152 ms    EKG:  The ekg ordered today demonstrates sinus rhythm and LBBB. QRS duration 133ms.  Recent Labs: 02/16/2021: Magnesium 2.4 04/06/2021: ALT 17; Hemoglobin 13.3; Platelets 210 05/02/2021: BUN 31; Creatinine, Ser 1.81; Potassium 4.9; Sodium 141  Recent Lipid Panel    Component Value Date/Time  CHOL 111 01/16/2021 1511   CHOL 400 (H) 02/25/2019 0905   TRIG 76 01/16/2021 1511   HDL 58 01/16/2021 1511   HDL 51 02/25/2019 0905   CHOLHDL 1.9 01/16/2021 1511   VLDL 15 01/16/2021 1511   LDLCALC 38 01/16/2021 1511   LDLCALC 290 (H) 02/25/2019 0905    Physical Exam:    VS:  BP 124/64   Pulse 71   Ht 5\' 9"  (1.753 m)   Wt 176 lb (79.8 kg)   BMI 25.99 kg/m     Wt Readings from Last 3 Encounters:  05/03/21 176 lb (79.8 kg)  05/02/21 175 lb 6.4 oz (79.6 kg)  04/25/21 171 lb 12.8 oz (77.9 kg)     GEN:  Well nourished, well developed in no acute distress HEENT: Normal NECK: No JVD; No carotid bruits LYMPHATICS: No lymphadenopathy CARDIAC: RRR, no murmurs, rubs, gallops RESPIRATORY:  Clear to auscultation without rales, wheezing or rhonchi  ABDOMEN: Soft, non-tender, non-distended MUSCULOSKELETAL:  No edema; No deformity  SKIN: Warm and dry NEUROLOGIC:  Alert and oriented x 3 PSYCHIATRIC:  Normal affect   ASSESSMENT:    1. Chronic systolic heart failure ([place])   2. Coronary  artery disease involving native coronary artery of native heart without angina pectoris   3. Stage 3 chronic kidney disease, unspecified whether stage 3a or 3b CKD ([place])   4. Primary hypertension    PLAN:    In order of problems listed above:  1. Chronic systolic heart failure (HCC) NYHA II-III. Warm and dry today. On good medical therapy with digoxin, losartan, metoprolol.  Given his persistently reduced EF despite good medical therapy, I do think he is a candidate for ICD. Given his LBBB, I would recommend a CRT-D.  The patient has an ischemic CM (EF <20%), NYHA Class III CHF, and CAD.  He is referred by Dr Aundra Dubin for risk stratification of sudden death and consideration of ICD implantation.  At this time, he meets criteria for ICD implantation for primary prevention of sudden death.  I have had a thorough discussion with the patient reviewing options.  The patient and their family (if available) have had opportunities to ask questions and have them answered. The patient and I have decided together through a shared decision making process to implant a CRT-D at this time.   Risks, benefits, alternatives to ICD implantation were discussed in detail with the patient today. The patient understands that the risks include but are not limited to bleeding, infection, pneumothorax, perforation, tamponade, vascular damage, renal failure, MI, stroke, death, inappropriate shocks, and lead dislodgement and wishes to proceed.  We will therefore schedule device implantation at the next available time.   2. Coronary artery disease involving native coronary artery of native heart without angina pectoris No ischemic symptoms today. Continue aspirin.   3. Stage 3 chronic kidney disease, unspecified whether stage 3a or 3b CKD ([place])   4. Primary hypertension Controlled today. Continue current regimen.        Total time spent with patient today 65 minutes. This includes reviewing records, evaluating the  patient and coordinating care.  Medication Adjustments/Labs and Tests Ordered: Current medicines are reviewed at length with the patient today.  Concerns regarding medicines are outlined above.  Orders Placed This Encounter  Procedures   Basic metabolic panel   CBC   EKG 12-Lead    No orders of the defined types were placed in this encounter.    Signed, Hilton Cork. Quentin Ore, MD, [facility], [facility]  05/03/2021 10:19 PM    Electrophysiology [facility]

## 2021-05-03 NOTE — H&P (View-Only) (Signed)
Electrophysiology Office Note:    Date:  05/03/2021   ID:  Tommy Ward, DOB 1941/05/21, MRN [id number]  PCP:  Serita Grammes, MD  [facility] Cardiologist:  Pixie Casino, MD  [facility] Electrophysiologist:  Vickie Epley, MD   Referring MD: Larey Dresser, MD   Chief Complaint: Ischemic cardiomyopathy  History of Present Illness:    Tommy Ward is a 80 y.o. male who presents for an evaluation of ischemic cardiomyopathy at the request of Dr. Aundra Dubin. 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  The patient was last seen by Dr. Aundra Dubin on April 25, 2021.  At that appointment the patient expressed interest in discontinuing the LifeVest and getting a CRT-D implanted.  He is hoping to make a trip to [place] to see family.  He is with his wife today.  Past Medical History:  Diagnosis Date   Abnormal stress test 12/05/2020   AKI (acute kidney injury) ([place]) 11/25/2019   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[place]) 08/24/2019   Aortic valve disorder 07/08/2014   B12 deficiency 03/22/2018   Benign hypertension with chronic kidney disease, stage III ([place]) 05/20/2019   CAD (coronary artery disease) 11/23/2020   Cancer ([place]) 08/2008   prostate ca   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   Cataract 11/2013   bilateral   Chronic renal insufficiency, stage III (moderate) (HCC) 02/54/2706   Chronic systolic heart failure (HCC)    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 02/24/2019   Coronary arteriosclerosis 07/06/2012   Decreased cardiac ejection fraction 05/20/2019   Depressed left ventricular ejection fraction 12/05/2020   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   Essential hypertension 07/06/2012   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   Gastroesophageal reflux disease without esophagitis 03/22/2018   GERD (gastroesophageal reflux disease)    HFrEF (heart failure with reduced  ejection fraction) ([place]) 11/23/2020   Hypercalcemia of malignancy 02/16/2021   Hyperkalemia 08/24/2019   Hyperlipidemia    Hyperphosphatemia 11/25/2019   Hypertension    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   Left bundle branch block 07/06/2012   Malignant neoplasm of prostate ([place])    Malignant neoplasm of prostate ([place])    Metabolic bone disease 23/76/2831   Mixed hyperlipidemia 07/06/2012   Nonrheumatic mitral valve regurgitation 12/05/2020   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   Olecranon bursitis of left elbow 06/25/2018   Pre-diabetes 12/05/2020   Pulmonary hypertension ([place]) 12/05/2020   S/P radiation therapy > 12 wks ago 2010   prostate CA   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[place])    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[place]) 12/05/2020   Tobacco use 12/05/2020   Vitamin D deficiency 08/24/2019    Past Surgical History:  Procedure Laterality Date   CHOLECYSTECTOMY  01/2003   lap chole   left hip repaired     RIGHT/LEFT HEART CATH AND CORONARY ANGIOGRAPHY N/A 12/08/2020   Procedure: RIGHT/LEFT HEART CATH AND CORONARY ANGIOGRAPHY;  Surgeon: Wellington Hampshire, MD;  Location: [facility];  Service: Cardiovascular;  Laterality: N/A;   TEE WITHOUT CARDIOVERSION N/A 01/26/2021   Procedure: TRANSESOPHAGEAL ECHOCARDIOGRAM (TEE);  Surgeon: Larey Dresser, MD;  Location: [facility] ENDOSCOPY;  Service: Cardiovascular;  Laterality: N/A;    Current Medications: Current Meds  Medication Sig   acetaminophen (TYLENOL) 500 MG tablet Take 500 mg by mouth every 6 (six) hours as needed for moderate pain or headache.   aspirin EC 81  MG tablet Take 1 tablet (81 mg total) by mouth daily.   cetirizine (ZYRTEC) 10 MG tablet Take 10 mg by mouth daily as needed for allergies.   cholestyramine light (PREVALITE) 4 GM/DOSE powder Take 4 g by mouth daily.   denosumab (XGEVA) 120 MG/1.7ML SOLN injection Inject 120 mg into the skin every  30 (thirty) days.   digoxin (LANOXIN) 0.125 MG tablet Take 0.5 tablets (0.0625 mg total) by mouth every other day.   enzalutamide (XTANDI) 40 MG tablet Take 160 mg by mouth daily.   Evolocumab 140 MG/ML SOAJ Inject 140 mg into the skin every 14 (fourteen) days.   losartan (COZAAR) 25 MG tablet Take 0.5 tablets (12.5 mg total) by mouth at bedtime.   metoprolol succinate (TOPROL XL) 25 MG 24 hr tablet Take 0.5 tablets (12.5 mg total) by mouth daily.   omeprazole (PRILOSEC) 40 MG capsule Take 40 mg by mouth daily.   ondansetron (ZOFRAN) 4 MG tablet Take 1 tablet (4 mg total) by mouth every 8 (eight) hours as needed for nausea or vomiting.   pregabalin (LYRICA) 100 MG capsule Take 100 mg by mouth 2 (two) times daily.   spironolactone (ALDACTONE) 25 MG tablet Take 0.5 tablets (12.5 mg total) by mouth daily.   tamsulosin (FLOMAX) 0.4 MG CAPS capsule Take 0.4 mg by mouth daily.   torsemide (DEMADEX) 20 MG tablet Take 2 tablets (40 mg total) by mouth 2 (two) times daily.     Allergies:   Ezetimibe, Gabapentin, and Statins   Social History   Socioeconomic History   Marital status: Married    Spouse name: Not on file   Number of children: Not on file   Years of education: Not on file   Highest education level: Not on file  Occupational History   Not on file  Tobacco Use   Smoking status: Every Day    Packs/day: 0.50    Years: 50.00    Pack years: 25.00    Types: Cigarettes   Smokeless tobacco: Never  Vaping Use   Vaping Use: Never used  Substance and Sexual Activity   Alcohol use: Not Currently   Drug use: Never   Sexual activity: Not on file  Other Topics Concern   Not on file  Social History Narrative   Not on file   Social Determinants of Health   Financial Resource Strain: Not on file  Food Insecurity: Not on file  Transportation Needs: Not on file  Physical Activity: Not on file  Stress: Not on file  Social Connections: Not on file     Family History: The patient's  family history includes Heart attack in his brother; Leukemia in his mother; Prostate cancer in his father. There is no history of Colon cancer, Rectal cancer, Stomach cancer, or Esophageal cancer.  ROS:   Please see the history of present illness.    All other systems reviewed and are negative.  EKGs/Labs/Other Studies Reviewed:    The following studies were reviewed today:  March 05, 2021 echo personally reviewed Left ventricular function severely decreased, less than 20% Right ventricular function normal Dilated left atrium Moderate MR  04/25/2021 ECG Left bundle branch block, QRS duration 152 ms    EKG:  The ekg ordered today demonstrates sinus rhythm and LBBB. QRS duration 11ms.  Recent Labs: 02/16/2021: Magnesium 2.4 04/06/2021: ALT 17; Hemoglobin 13.3; Platelets 210 05/02/2021: BUN 31; Creatinine, Ser 1.81; Potassium 4.9; Sodium 141  Recent Lipid Panel    Component Value Date/Time  CHOL 111 01/16/2021 1511   CHOL 400 (H) 02/25/2019 0905   TRIG 76 01/16/2021 1511   HDL 58 01/16/2021 1511   HDL 51 02/25/2019 0905   CHOLHDL 1.9 01/16/2021 1511   VLDL 15 01/16/2021 1511   LDLCALC 38 01/16/2021 1511   LDLCALC 290 (H) 02/25/2019 0905    Physical Exam:    VS:  BP 124/64   Pulse 71   Ht 5\' 9"  (1.753 m)   Wt 176 lb (79.8 kg)   BMI 25.99 kg/m     Wt Readings from Last 3 Encounters:  05/03/21 176 lb (79.8 kg)  05/02/21 175 lb 6.4 oz (79.6 kg)  04/25/21 171 lb 12.8 oz (77.9 kg)     GEN:  Well nourished, well developed in no acute distress HEENT: Normal NECK: No JVD; No carotid bruits LYMPHATICS: No lymphadenopathy CARDIAC: RRR, no murmurs, rubs, gallops RESPIRATORY:  Clear to auscultation without rales, wheezing or rhonchi  ABDOMEN: Soft, non-tender, non-distended MUSCULOSKELETAL:  No edema; No deformity  SKIN: Warm and dry NEUROLOGIC:  Alert and oriented x 3 PSYCHIATRIC:  Normal affect   ASSESSMENT:    1. Chronic systolic heart failure ([place])   2. Coronary  artery disease involving native coronary artery of native heart without angina pectoris   3. Stage 3 chronic kidney disease, unspecified whether stage 3a or 3b CKD ([place])   4. Primary hypertension    PLAN:    In order of problems listed above:  1. Chronic systolic heart failure (HCC) NYHA II-III. Warm and dry today. On good medical therapy with digoxin, losartan, metoprolol.  Given his persistently reduced EF despite good medical therapy, I do think he is a candidate for ICD. Given his LBBB, I would recommend a CRT-D.  The patient has an ischemic CM (EF <20%), NYHA Class III CHF, and CAD.  He is referred by Dr Aundra Dubin for risk stratification of sudden death and consideration of ICD implantation.  At this time, he meets criteria for ICD implantation for primary prevention of sudden death.  I have had a thorough discussion with the patient reviewing options.  The patient and their family (if available) have had opportunities to ask questions and have them answered. The patient and I have decided together through a shared decision making process to implant a CRT-D at this time.   Risks, benefits, alternatives to ICD implantation were discussed in detail with the patient today. The patient understands that the risks include but are not limited to bleeding, infection, pneumothorax, perforation, tamponade, vascular damage, renal failure, MI, stroke, death, inappropriate shocks, and lead dislodgement and wishes to proceed.  We will therefore schedule device implantation at the next available time.   2. Coronary artery disease involving native coronary artery of native heart without angina pectoris No ischemic symptoms today. Continue aspirin.   3. Stage 3 chronic kidney disease, unspecified whether stage 3a or 3b CKD ([place])   4. Primary hypertension Controlled today. Continue current regimen.        Total time spent with patient today 65 minutes. This includes reviewing records, evaluating the  patient and coordinating care.  Medication Adjustments/Labs and Tests Ordered: Current medicines are reviewed at length with the patient today.  Concerns regarding medicines are outlined above.  Orders Placed This Encounter  Procedures   Basic metabolic panel   CBC   EKG 12-Lead    No orders of the defined types were placed in this encounter.    Signed, Hilton Cork. Quentin Ore, MD, [facility], [facility]  05/03/2021 10:19 PM    Electrophysiology [facility]

## 2021-05-04 ENCOUNTER — Other Ambulatory Visit (HOSPITAL_COMMUNITY): Payer: Self-pay

## 2021-05-04 ENCOUNTER — Inpatient Hospital Stay: Payer: Medicare Other | Attending: Oncology

## 2021-05-04 ENCOUNTER — Other Ambulatory Visit: Payer: Self-pay

## 2021-05-04 ENCOUNTER — Other Ambulatory Visit: Payer: Self-pay | Admitting: Hematology and Oncology

## 2021-05-04 ENCOUNTER — Telehealth: Payer: Self-pay

## 2021-05-04 ENCOUNTER — Other Ambulatory Visit: Payer: Self-pay | Admitting: Pharmacist

## 2021-05-04 ENCOUNTER — Inpatient Hospital Stay: Payer: Medicare Other

## 2021-05-04 VITALS — BP 108/58 | HR 71 | Temp 97.8°F | Resp 18 | Ht 69.0 in | Wt 168.8 lb

## 2021-05-04 DIAGNOSIS — C61 Malignant neoplasm of prostate: Secondary | ICD-10-CM | POA: Insufficient documentation

## 2021-05-04 DIAGNOSIS — C7952 Secondary malignant neoplasm of bone marrow: Secondary | ICD-10-CM

## 2021-05-04 DIAGNOSIS — C7951 Secondary malignant neoplasm of bone: Secondary | ICD-10-CM | POA: Insufficient documentation

## 2021-05-04 LAB — BASIC METABOLIC PANEL
BUN/Creatinine Ratio: 19 (ref 10–24)
BUN: 31 mg/dL — ABNORMAL HIGH (ref 8–27)
BUN: 36 — AB (ref 4–21)
CO2: 27 mmol/L (ref 20–29)
CO2: 28 — AB (ref 13–22)
Calcium: 9.3 mg/dL (ref 8.6–10.2)
Chloride: 102 mmol/L (ref 96–106)
Chloride: 98 — AB (ref 99–108)
Creatinine, Ser: 1.66 mg/dL — ABNORMAL HIGH (ref 0.76–1.27)
Creatinine: 1.8 — AB (ref 0.6–1.3)
Glucose: 97
Glucose: 98 mg/dL (ref 65–99)
Potassium: 4 (ref 3.4–5.3)
Potassium: 5 mmol/L (ref 3.5–5.2)
Sodium: 134 — AB (ref 137–147)
Sodium: 144 mmol/L (ref 134–144)
eGFR: 42 mL/min/{1.73_m2} — ABNORMAL LOW (ref >59)

## 2021-05-04 LAB — HEPATIC FUNCTION PANEL
ALT: 14 (ref 10–40)
AST: 33 (ref 14–40)
Alkaline Phosphatase: 78 (ref 25–125)
Bilirubin, Total: 1.1

## 2021-05-04 LAB — COMPREHENSIVE METABOLIC PANEL
Albumin: 4.1 (ref 3.5–5.0)
Calcium: 9.1 (ref 8.7–10.7)

## 2021-05-04 LAB — CBC
Hematocrit: 44.9 % (ref 37.5–51.0)
Hemoglobin: 15.2 g/dL (ref 13.0–17.7)
MCH: 29.6 pg (ref 26.6–33.0)
MCHC: 33.9 g/dL (ref 31.5–35.7)
MCV: 87 fL (ref 79–97)
Platelets: 248 10*3/uL (ref 150–450)
RBC: 5.14 x10E6/uL (ref 4.14–5.80)
RDW: 18.7 % — ABNORMAL HIGH (ref 11.6–15.4)
WBC: 5.9 10*3/uL (ref 3.4–10.8)

## 2021-05-04 MED ORDER — DENOSUMAB 120 MG/1.7ML ~~LOC~~ SOLN
120.0000 mg | Freq: Once | SUBCUTANEOUS | Status: AC
  Administered 2021-05-04: 120 mg via SUBCUTANEOUS
  Filled 2021-05-04: qty 1.7

## 2021-05-04 NOTE — Patient Instructions (Signed)
Denosumab injection What is this medication? DENOSUMAB (den oh sue mab) slows bone breakdown. Prolia is used to treat osteoporosis in women after menopause and in men, and in people who are taking corticosteroids for 6 months or more. Xgeva is used to treat a high calcium level due to cancer and to prevent bone fractures and other bone problems caused by multiple myeloma or cancer bone metastases. Xgeva is also used to treat giant cell tumor of the bone. This medicine may be used for other purposes; ask your health care provider or pharmacist if you have questions. COMMON BRAND NAME(S): Prolia, XGEVA What should I tell my care team before I take this medication? They need to know if you have any of these conditions: dental disease having surgery or tooth extraction infection kidney disease low levels of calcium or Vitamin D in the blood malnutrition on hemodialysis skin conditions or sensitivity thyroid or parathyroid disease an unusual reaction to denosumab, other medicines, foods, dyes, or preservatives pregnant or trying to get pregnant breast-feeding How should I use this medication? This medicine is for injection under the skin. It is given by a health care professional in a hospital or clinic setting. A special MedGuide will be given to you before each treatment. Be sure to read this information carefully each time. For Prolia, talk to your pediatrician regarding the use of this medicine in children. Special care may be needed. For Xgeva, talk to your pediatrician regarding the use of this medicine in children. While this drug may be prescribed for children as young as 13 years for selected conditions, precautions do apply. Overdosage: If you think you have taken too much of this medicine contact a poison control center or emergency room at once. NOTE: This medicine is only for you. Do not share this medicine with others. What if I miss a dose? It is important not to miss your dose.  Call your doctor or health care professional if you are unable to keep an appointment. What may interact with this medication? Do not take this medicine with any of the following medications: other medicines containing denosumab This medicine may also interact with the following medications: medicines that lower your chance of fighting infection steroid medicines like prednisone or cortisone This list may not describe all possible interactions. Give your health care provider a list of all the medicines, herbs, non-prescription drugs, or dietary supplements you use. Also tell them if you smoke, drink alcohol, or use illegal drugs. Some items may interact with your medicine. What should I watch for while using this medication? Visit your doctor or health care professional for regular checks on your progress. Your doctor or health care professional may order blood tests and other tests to see how you are doing. Call your doctor or health care professional for advice if you get a fever, chills or sore throat, or other symptoms of a cold or flu. Do not treat yourself. This drug may decrease your body's ability to fight infection. Try to avoid being around people who are sick. You should make sure you get enough calcium and vitamin D while you are taking this medicine, unless your doctor tells you not to. Discuss the foods you eat and the vitamins you take with your health care professional. See your dentist regularly. Brush and floss your teeth as directed. Before you have any dental work done, tell your dentist you are receiving this medicine. Do not become pregnant while taking this medicine or for 5 months after   stopping it. Talk with your doctor or health care professional about your birth control options while taking this medicine. Women should inform their doctor if they wish to become pregnant or think they might be pregnant. There is a potential for serious side effects to an unborn child. Talk to  your health care professional or pharmacist for more information. What side effects may I notice from receiving this medication? Side effects that you should report to your doctor or health care professional as soon as possible: allergic reactions like skin rash, itching or hives, swelling of the face, lips, or tongue bone pain breathing problems dizziness jaw pain, especially after dental work redness, blistering, peeling of the skin signs and symptoms of infection like fever or chills; cough; sore throat; pain or trouble passing urine signs of low calcium like fast heartbeat, muscle cramps or muscle pain; pain, tingling, numbness in the hands or feet; seizures unusual bleeding or bruising unusually weak or tired Side effects that usually do not require medical attention (report to your doctor or health care professional if they continue or are bothersome): constipation diarrhea headache joint pain loss of appetite muscle pain runny nose tiredness upset stomach This list may not describe all possible side effects. Call your doctor for medical advice about side effects. You may report side effects to FDA at 1-800-FDA-1088. Where should I keep my medication? This medicine is only given in a clinic, doctor's office, or other health care setting and will not be stored at home. NOTE: This sheet is a summary. It may not cover all possible information. If you have questions about this medicine, talk to your doctor, pharmacist, or health care provider.  2022 Elsevier/Gold Standard (2017-12-12 16:10:44)

## 2021-05-04 NOTE — Pre-Procedure Instructions (Signed)
Instructed patient on the following items: Arrival time 0530 Nothing to eat or drink after midnight No meds AM of procedure Responsible person to drive you home and stay with you for 24 hrs Wash with special soap night before and morning of procedure  

## 2021-05-04 NOTE — Telephone Encounter (Signed)
Spoke with patient regarding results and recommendation.  Patient verbalizes understanding and is agreeable to plan of care. Advised patient to call back with any issues or concerns.  

## 2021-05-04 NOTE — Telephone Encounter (Signed)
-----   Message from Jenean Lindau, MD sent at 05/03/2021  4:48 PM EDT ----- The results of the study is unremarkable. Please inform patient. I will discuss in detail at next appointment. Cc  primary care/referring physician Jenean Lindau, MD 05/03/2021 4:48 PM

## 2021-05-04 NOTE — Progress Notes (Signed)
1513: PT STABLE AT TIME OF DISCHARGE  

## 2021-05-07 ENCOUNTER — Ambulatory Visit (HOSPITAL_COMMUNITY): Payer: Medicare Other

## 2021-05-07 ENCOUNTER — Other Ambulatory Visit (HOSPITAL_COMMUNITY): Payer: Medicare Other

## 2021-05-07 ENCOUNTER — Encounter (HOSPITAL_COMMUNITY)
Admission: RE | Disposition: A | Discharge status: Home / Self Care | Payer: Self-pay | Source: Home / Self Care | Attending: Cardiology

## 2021-05-07 ENCOUNTER — Encounter (HOSPITAL_COMMUNITY): Payer: Self-pay | Admitting: Cardiology

## 2021-05-07 ENCOUNTER — Ambulatory Visit (HOSPITAL_COMMUNITY)
Admission: RE | Admit: 2021-05-07 | Discharge: 2021-05-07 | Disposition: A | Discharge status: Home / Self Care | Payer: Medicare Other | Attending: Cardiology | Admitting: Cardiology

## 2021-05-07 ENCOUNTER — Other Ambulatory Visit: Payer: Self-pay

## 2021-05-07 DIAGNOSIS — Z888 Allergy status to other drugs, medicaments and biological substances status: Secondary | ICD-10-CM | POA: Diagnosis not present

## 2021-05-07 DIAGNOSIS — I447 Left bundle-branch block, unspecified: Secondary | ICD-10-CM | POA: Diagnosis not present

## 2021-05-07 DIAGNOSIS — I5022 Chronic systolic (congestive) heart failure: Secondary | ICD-10-CM | POA: Diagnosis not present

## 2021-05-07 DIAGNOSIS — Z8546 Personal history of malignant neoplasm of prostate: Secondary | ICD-10-CM | POA: Diagnosis not present

## 2021-05-07 DIAGNOSIS — I340 Nonrheumatic mitral (valve) insufficiency: Secondary | ICD-10-CM | POA: Diagnosis not present

## 2021-05-07 DIAGNOSIS — Z7982 Long term (current) use of aspirin: Secondary | ICD-10-CM | POA: Diagnosis not present

## 2021-05-07 DIAGNOSIS — I2510 Atherosclerotic heart disease of native coronary artery without angina pectoris: Secondary | ICD-10-CM | POA: Diagnosis not present

## 2021-05-07 DIAGNOSIS — F17210 Nicotine dependence, cigarettes, uncomplicated: Secondary | ICD-10-CM | POA: Diagnosis not present

## 2021-05-07 DIAGNOSIS — I130 Hypertensive heart and chronic kidney disease with heart failure and stage 1 through stage 4 chronic kidney disease, or unspecified chronic kidney disease: Secondary | ICD-10-CM | POA: Insufficient documentation

## 2021-05-07 DIAGNOSIS — Z79899 Other long term (current) drug therapy: Secondary | ICD-10-CM | POA: Diagnosis not present

## 2021-05-07 DIAGNOSIS — N183 Chronic kidney disease, stage 3 unspecified: Secondary | ICD-10-CM | POA: Insufficient documentation

## 2021-05-07 DIAGNOSIS — Z95810 Presence of automatic (implantable) cardiac defibrillator: Secondary | ICD-10-CM

## 2021-05-07 DIAGNOSIS — I255 Ischemic cardiomyopathy: Secondary | ICD-10-CM | POA: Insufficient documentation

## 2021-05-07 HISTORY — PX: BIV ICD INSERTION CRT-D: EP1195

## 2021-05-07 SURGERY — BIV ICD INSERTION CRT-D

## 2021-05-07 MED ORDER — ONDANSETRON HCL 4 MG/2ML IJ SOLN: 4.0000 mg | Freq: Four times a day (QID) | INTRAMUSCULAR | Status: DC | PRN

## 2021-05-07 MED ORDER — SODIUM CHLORIDE 0.9 % IV SOLN
80.0000 mg | INTRAVENOUS | Status: AC
  Administered 2021-05-07: 80 mg
  Filled 2021-05-07: qty 2

## 2021-05-07 MED ORDER — IOHEXOL 350 MG/ML SOLN
INTRAVENOUS | Status: DC | PRN
  Administered 2021-05-07: 5 mL

## 2021-05-07 MED ORDER — LIDOCAINE HCL 1 % IJ SOLN
INTRAMUSCULAR | Status: AC
  Filled 2021-05-07: qty 60

## 2021-05-07 MED ORDER — MIDAZOLAM HCL 5 MG/5ML IJ SOLN
INTRAMUSCULAR | Status: DC | PRN
  Administered 2021-05-07: 1 mg via INTRAVENOUS

## 2021-05-07 MED ORDER — CEFAZOLIN SODIUM-DEXTROSE 2-4 GM/100ML-% IV SOLN
INTRAVENOUS | Status: AC
  Filled 2021-05-07: qty 100

## 2021-05-07 MED ORDER — POVIDONE-IODINE 10 % EX SWAB
2.0000 "application " | Freq: Once | CUTANEOUS | Status: AC
  Administered 2021-05-07: 2 via TOPICAL

## 2021-05-07 MED ORDER — SODIUM CHLORIDE 0.9 % IV SOLN
INTRAVENOUS | Status: AC
  Filled 2021-05-07: qty 2

## 2021-05-07 MED ORDER — ACETAMINOPHEN 325 MG PO TABS
325.0000 mg | ORAL_TABLET | ORAL | Status: DC | PRN
  Filled 2021-05-07: qty 2

## 2021-05-07 MED ORDER — SODIUM CHLORIDE 0.9 % IV SOLN: INTRAVENOUS | Status: DC

## 2021-05-07 MED ORDER — HEPARIN (PORCINE) IN NACL 1000-0.9 UT/500ML-% IV SOLN
INTRAVENOUS | Status: AC
  Filled 2021-05-07: qty 500

## 2021-05-07 MED ORDER — MIDAZOLAM HCL 5 MG/5ML IJ SOLN
INTRAMUSCULAR | Status: AC
  Filled 2021-05-07: qty 5

## 2021-05-07 MED ORDER — FENTANYL CITRATE (PF) 100 MCG/2ML IJ SOLN
INTRAMUSCULAR | Status: AC
  Filled 2021-05-07: qty 2

## 2021-05-07 MED ORDER — LIDOCAINE HCL (PF) 1 % IJ SOLN
INTRAMUSCULAR | Status: DC | PRN
  Administered 2021-05-07: 60 mL

## 2021-05-07 MED ORDER — CHLORHEXIDINE GLUCONATE 4 % EX LIQD: 4.0000 "application " | Freq: Once | CUTANEOUS | Status: DC

## 2021-05-07 MED ORDER — HEPARIN (PORCINE) IN NACL 1000-0.9 UT/500ML-% IV SOLN
INTRAVENOUS | Status: DC | PRN
  Administered 2021-05-07: 500 mL

## 2021-05-07 MED ORDER — FENTANYL CITRATE (PF) 100 MCG/2ML IJ SOLN
INTRAMUSCULAR | Status: DC | PRN
  Administered 2021-05-07: 25 ug via INTRAVENOUS

## 2021-05-07 MED ORDER — CEFAZOLIN SODIUM-DEXTROSE 2-4 GM/100ML-% IV SOLN
2.0000 g | INTRAVENOUS | Status: AC
  Administered 2021-05-07: 2 g via INTRAVENOUS

## 2021-05-07 SURGICAL SUPPLY — 14 items
CABLE SURGICAL S-101-97-12 (CABLE) ×2 IMPLANT
CATH ATTAIN COM SURV 6250V-EH (CATHETERS) ×2 IMPLANT
ICD GALLANT HFCRTD CDHFA500Q (ICD Generator) ×2 IMPLANT
LEAD DURATA 7122Q-65CM (Lead) ×2 IMPLANT
LEAD QUARTET 1458Q-86CM (Lead) ×2 IMPLANT
LEAD TENDRIL MRI 52CM LPA1200M (Lead) ×2 IMPLANT
PAD PRO RADIOLUCENT 2001M-C (PAD) ×2 IMPLANT
SHEATH 7FR PRELUDE SNAP 13 (SHEATH) ×2 IMPLANT
SHEATH 8FR PRELUDE SNAP 13 (SHEATH) ×2 IMPLANT
SHEATH 9FR PRELUDE SNAP 13 (SHEATH) ×2 IMPLANT
SLITTER 6232ADJ (MISCELLANEOUS) ×2 IMPLANT
TRAY PACEMAKER INSERTION (PACKS) ×2 IMPLANT
WIRE ACUITY WHISPER EDS 4648 (WIRE) ×2 IMPLANT
WIRE HI TORQ VERSACORE-J 145CM (WIRE) ×2 IMPLANT

## 2021-05-07 NOTE — Discharge Instructions (Signed)
    Supplemental Discharge Instructions for  Pacemaker/Defibrillator Patients  Tomorrow, 05/08/21, send in a device transmission  Activity No heavy lifting or vigorous activity with your left/right arm for 6 to 8 weeks.  Do not raise your left/right arm above your head for one week.  Gradually raise your affected arm as drawn below.              05/12/21                    05/13/21                  05/14/21                  05/15/21 __  NO DRIVING for  1 week   ; you may begin driving on   2/45/80  .  WOUND CARE Keep the wound area clean and dry.  Do not get this area wet , no showers for one week; you may shower on  05/15/21   . Tomorrow, 05/08/21, remove the arm sling Tomorrow, 05/08/21 remove the LARGE outer plastic bandage.  Underneath the plastic bandage there are steri strips (paper tapes), DO NOT remove these. The tape/steri-strips on your wound will fall off; do not pull them off.  No bandage is needed on the site.  DO  NOT apply any creams, oils, or ointments to the wound area. If you notice any drainage or discharge from the wound, any swelling or bruising at the site, or you develop a fever > 101? F after you are discharged home, call the office at once.  Special Instructions You are still able to use cellular telephones; use the ear opposite the side where you have your pacemaker/defibrillator.  Avoid carrying your cellular phone near your device. When traveling through airports, show security personnel your identification card to avoid being screened in the metal detectors.  Ask the security personnel to use the hand wand. Avoid arc welding equipment, MRI testing (magnetic resonance imaging), TENS units (transcutaneous nerve stimulators).  Call the office for questions about other devices. Avoid electrical appliances that are in poor condition or are not properly grounded. Microwave ovens are safe to be near or to operate.  Additional information for defibrillator patients should  your device go off: If your device goes off ONCE and you feel fine afterward, notify the device clinic nurses. If your device goes off ONCE and you do not feel well afterward, call 911. If your device goes off TWICE, call 911. If your device goes off THREE times in one day, call 911.  DO NOT DRIVE YOURSELF OR A FAMILY MEMBER WITH A DEFIBRILLATOR TO THE HOSPITAL--CALL 911.

## 2021-05-07 NOTE — Interval H&P Note (Signed)
History and Physical Interval Note:  05/07/2021 7:07 AM  Tommy Ward  has presented today for surgery, with the diagnosis of cardiomyopathy.  The various methods of treatment have been discussed with the patient and family. After consideration of risks, benefits and other options for treatment, the patient has consented to  Procedure(s): BIV ICD INSERTION CRT-D (N/A) as a surgical intervention.  The patient's history has been reviewed, patient examined, no change in status, stable for surgery.  I have reviewed the patient's chart and labs.  Questions were answered to the patient's satisfaction.     Corrinna Karapetyan T Haadi Santellan

## 2021-05-07 NOTE — Progress Notes (Signed)
Patient was given discharge instructions. He verbalized understanding. 

## 2021-05-08 ENCOUNTER — Telehealth: Payer: Self-pay | Admitting: Cardiology

## 2021-05-08 MED FILL — Lidocaine HCl Local Inj 1%: INTRAMUSCULAR | Qty: 60 | Status: AC

## 2021-05-08 NOTE — Telephone Encounter (Signed)
Spoke with patient.  Advised aptient that he may be experiencing PNS from his BiV device.  Advised that this is not dangerous, but can be aggrevating, we can test/ try to program around it in clinic.  Pt reports that he was worried but it does go away with position change so he will see how it goes for a couple of days rather than come to office since he lives far.   Follow-up after same day discharge: Implant date: 05/07/21 MD: Dr. Quentin Ore Device: SJM CRT-D Location: Left chest   Wound check visit: 05/17/21 90 day MD follow-up: :08/15/21  Dressing removed: Not yet, patient educated to remove dressing today and s/s of infection to monitor for and report.    Patient has mobile app on phone, per [employer] received today.  Educated patient to keep app running in background of phone.    Reviewed Activity restrictions from discharge summary.

## 2021-05-08 NOTE — Telephone Encounter (Signed)
  1. Has your device fired?   2. Is you device beeping? No beeping  3. Are you experiencing draining or swelling at device site? No, pt is experiencing a thumping under his left arm and down his left side but it's not feeling.  4. Are you calling to see if we received your device transmission?   5. Have you passed out? No    Please route to [place]

## 2021-05-08 NOTE — Telephone Encounter (Signed)
Patient called wanting to know is it normal for him to have thumping when he lays on his left side from the armpit down. Patient denies any chest pains.

## 2021-05-11 ENCOUNTER — Ambulatory Visit (INDEPENDENT_AMBULATORY_CARE_PROVIDER_SITE_OTHER): Payer: Medicare Other | Admitting: Sports Medicine

## 2021-05-11 ENCOUNTER — Encounter: Payer: Self-pay | Admitting: Sports Medicine

## 2021-05-11 ENCOUNTER — Other Ambulatory Visit: Payer: Self-pay

## 2021-05-11 DIAGNOSIS — I2510 Atherosclerotic heart disease of native coronary artery without angina pectoris: Secondary | ICD-10-CM | POA: Diagnosis not present

## 2021-05-11 DIAGNOSIS — M79674 Pain in right toe(s): Secondary | ICD-10-CM | POA: Diagnosis not present

## 2021-05-11 DIAGNOSIS — B351 Tinea unguium: Secondary | ICD-10-CM

## 2021-05-11 DIAGNOSIS — G629 Polyneuropathy, unspecified: Secondary | ICD-10-CM

## 2021-05-11 DIAGNOSIS — M79675 Pain in left toe(s): Secondary | ICD-10-CM

## 2021-05-11 NOTE — Progress Notes (Signed)
Subjective: Tommy Ward is a 80 y.o. male patient seen today in office with complaint of mildly painful thickened and elongated toenails; unable to trim. Patient denies history of Diabetes or Vascular disease but has an extensive history of neuropathy unsure of the cause but does admit to history of cancer and admits to a history of having left hip surgery where they had to go back in and do something to the nerves. Patient has no other pedal complaints at this time.   Patient is assisted by wife this visit.  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   HFrEF (heart failure with reduced ejection fraction) ([place]) 11/23/2020   CAD (coronary artery disease) 11/23/2020   GERD (gastroesophageal reflux disease)    Hyperlipidemia    Hypertension    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[place]) 06/02/2020   Malignant neoplasm of prostate ([place]) 06/02/2020   AKI (acute kidney injury) ([place]) 11/25/2019   Hyperphosphatemia 11/25/2019   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[place]) 08/24/2019   Hyperkalemia 09/73/5329   Metabolic bone disease 92/42/6834   Vitamin D deficiency 08/24/2019   Benign hypertension with chronic kidney disease, stage III ([place]) 05/20/2019   Decreased cardiac ejection fraction 05/20/2019   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 02/24/2019   Olecranon bursitis of left elbow 06/25/2018   B12 deficiency 03/22/2018   Gastroesophageal reflux  disease without esophagitis 03/22/2018   Chronic renal insufficiency, stage III (moderate) ([place]) 11/27/2015   Aortic valve disorder 07/08/2014   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   Cataract 11/2013   Essential hypertension 07/06/2012   Coronary arteriosclerosis 07/06/2012   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   Left bundle branch block 07/06/2012   Mixed hyperlipidemia 07/06/2012   Cancer ([place]) 08/2008   S/P radiation therapy > 12 wks ago 2010    Current Outpatient Medications on File Prior to Visit  Medication Sig Dispense Refill   acetaminophen (TYLENOL) 500 MG tablet Take 500 mg by mouth every 6 (six) hours as needed for moderate pain or headache.     aspirin EC 81 MG tablet Take 1 tablet (81 mg total) by mouth daily. 90 tablet 3   cetirizine (ZYRTEC) 10 MG tablet Take 10 mg by mouth daily as needed for allergies.     cholestyramine light (PREVALITE) 4 GM/DOSE powder Take 4 g by mouth daily.     denosumab (XGEVA) 120 MG/1.7ML SOLN injection Inject 120 mg into the skin every 30 (thirty) days.     digoxin (LANOXIN) 0.125 MG tablet Take 0.5 tablets (0.0625 mg total) by mouth every other day. 15 tablet 0   enzalutamide (XTANDI) 40 MG tablet Take 160 mg by mouth daily.     Evolocumab 140 MG/ML SOAJ Inject 140 mg into the skin every 14 (fourteen) days.     losartan (COZAAR) 25 MG tablet Take 0.5 tablets (12.5 mg total) by mouth at bedtime. 45 tablet 3   metoprolol succinate (TOPROL XL) 25 MG 24 hr tablet Take 0.5 tablets (12.5 mg total) by  mouth daily. 45 tablet 3   omeprazole (PRILOSEC) 40 MG capsule Take 40 mg by mouth daily.     ondansetron (ZOFRAN) 4 MG tablet Take 1 tablet (4 mg total) by mouth every 8 (eight) hours as needed for nausea or vomiting. 20 tablet 0   pregabalin (LYRICA) 100 MG capsule Take 100 mg by mouth 2 (two) times daily.     spironolactone (ALDACTONE) 25 MG tablet Take 0.5 tablets (12.5 mg total) by mouth daily.     tamsulosin  (FLOMAX) 0.4 MG CAPS capsule Take 0.4 mg by mouth daily.     torsemide (DEMADEX) 20 MG tablet Take 2 tablets (40 mg total) by mouth 2 (two) times daily. 60 tablet    No current facility-administered medications on file prior to visit.    Allergies  Allergen Reactions   Ezetimibe Other (See Comments)    Myalgia   Gabapentin Itching and Other (See Comments)    Sore throat, breakouts   Statins Other (See Comments)    Myalgias (intolerance)    Objective: Physical Exam  General: Well developed, nourished, no acute distress, awake, alert and oriented x 3  Vascular: Dorsalis pedis artery 1/4 bilateral, Posterior tibial artery 1/4 bilateral, skin temperature warm to warm proximal to distal bilateral lower extremities, mild varicosities, scant pedal hair present bilateral.  Neurological: Gross sensation present via light touch bilateral.   Dermatological: Skin is warm, dry, and supple bilateral, Nails 1-10 are tender, long, thick, and discolored with mild to moderate subungal debris, with worse pain at the right great toe where there is pincer nail deformity, no webspace macerations present bilateral, no open lesions present bilateral, no callus/corns/hyperkeratotic tissue present bilateral. No signs of infection bilateral.  Musculoskeletal: Asymptomatic hallux rigidus worse on the right boney deformities noted bilateral. Muscular strength within normal limits without painon range of motion. No pain with calf compression bilateral.  Assessment and Plan:  Problem List Items Addressed This Visit   None Visit Diagnoses     Pain due to onychomycosis of toenails of both feet    -  Primary   Neuropathy           -Examined patient.  -Discussed treatment options for painful mycotic nails. -Mechanically debrided and reduced mycotic nails with sterile nail nipper and dremel nail file without incident. -Advised patient that there is no cure for neuropathy should continue with Lyrica as  prescribed by long-term provider -Patient to return in 3 months for follow up evaluation or sooner if symptoms worsen.  Landis Martins, DPM

## 2021-05-16 ENCOUNTER — Ambulatory Visit (HOSPITAL_COMMUNITY)
Admission: RE | Admit: 2021-05-16 | Discharge: 2021-05-16 | Disposition: A | Discharge status: Home / Self Care | Payer: Medicare Other | Source: Ambulatory Visit | Attending: Cardiology | Admitting: Cardiology

## 2021-05-16 ENCOUNTER — Other Ambulatory Visit: Payer: Self-pay

## 2021-05-16 ENCOUNTER — Ambulatory Visit (INDEPENDENT_AMBULATORY_CARE_PROVIDER_SITE_OTHER): Payer: Medicare Other

## 2021-05-16 VITALS — BP 126/80 | HR 66 | Wt 173.4 lb

## 2021-05-16 DIAGNOSIS — I255 Ischemic cardiomyopathy: Secondary | ICD-10-CM | POA: Diagnosis not present

## 2021-05-16 DIAGNOSIS — Z79899 Other long term (current) drug therapy: Secondary | ICD-10-CM | POA: Diagnosis not present

## 2021-05-16 DIAGNOSIS — R0600 Dyspnea, unspecified: Secondary | ICD-10-CM | POA: Insufficient documentation

## 2021-05-16 DIAGNOSIS — N183 Chronic kidney disease, stage 3 unspecified: Secondary | ICD-10-CM | POA: Insufficient documentation

## 2021-05-16 DIAGNOSIS — I5022 Chronic systolic (congestive) heart failure: Secondary | ICD-10-CM

## 2021-05-16 DIAGNOSIS — I2510 Atherosclerotic heart disease of native coronary artery without angina pectoris: Secondary | ICD-10-CM | POA: Insufficient documentation

## 2021-05-16 DIAGNOSIS — Z7901 Long term (current) use of anticoagulants: Secondary | ICD-10-CM | POA: Diagnosis not present

## 2021-05-16 DIAGNOSIS — R0602 Shortness of breath: Secondary | ICD-10-CM | POA: Diagnosis not present

## 2021-05-16 DIAGNOSIS — I340 Nonrheumatic mitral (valve) insufficiency: Secondary | ICD-10-CM | POA: Diagnosis not present

## 2021-05-16 DIAGNOSIS — F17210 Nicotine dependence, cigarettes, uncomplicated: Secondary | ICD-10-CM | POA: Diagnosis not present

## 2021-05-16 MED ORDER — SPIRONOLACTONE 25 MG PO TABS: 25.0000 mg | ORAL_TABLET | Freq: Every day | ORAL | 11 refills | Status: DC

## 2021-05-16 NOTE — Progress Notes (Signed)
PCP: Serita Grammes, MD Cardiology: Dr. [name] HF Cardiology: Dr. Aundra Dubin  HPI:  80 y.o. with history of prostate cancer, CKD stage 3, CAD, and ischemic cardiomyopathy was referred by Dr. [name] for evaluation of CHF. Patient developed gradually worsening exertional dyspnea starting early this year. The shortness of breath had been especially bad for about 1.5 months. He had an echo done in 11/2020, showing EF < 20%, moderate-severe LV dilation, normal RV, severe MR. LHC/RHC was done showing low cardiac index at 1.81 and occluded mid LAD.  No intervention. 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    TEE was done 01/2021, showing EF < 20% with septal-lateral dyssynchrony, mildly decreased RV systolic function, moderate TR, moderate central MR with ERO 0.2 cm^2. Echo in 02/2021 similarly showed EF 20%, normal RV, moderate MR.    Patient returned on 04/25/21 for followup of CHF. Weight was down 1 lb. He was somewhat laconic but his wife said that he is doing better overall.  No dyspnea walking on flat ground. Dyspnea with moderate exertion. No orthopnea/PND. No chest pain. No lightheadedness. He wanted to get CRT-D device placed and Lifevest off so he can make a trip to see family in [place].  Today he returns to HF clinic for pharmacist medication titration. At last visit with MD, Delene Loll 24/26 mg BID was started and torsemide was decreased to 40 mg BID. Delene Loll was then stopped on 05/01/21 due to low home BP's and losartan 12.5 mg daily was started. When on Entresto, patient reported feeling poorly and did not want to get up out of bed. Noted home SBPs down in the 80s and BP on 04/27/21 was 97/68. BiV-ICD placed 05/07/21. Overall feeling good today. Feels "unbalanced" some mornings when he first gets up.  Denies fatigue. Dyspnea with moderate exertion. Weight at home stable around 171-172 lbs. Takes torsemide 40 mg  BID and has not needed any extra. No LEE, PND/orthopnea. Appetite is ok. Patient is taking and tolerating all his medications.  HF Medications: Metoprolol succinate 12.5 mg daily Losartan 12.5 mg daily  Spironolactone 12.5 mg daily Digoxin 0.0625 mg every other day Torsemide 40 mg BID  Has the patient been experiencing any side effects to the medications prescribed? No - Patient is tolerating all his medications.  Does the patient have any problems obtaining medications due to transportation or finances? No - Patient has BCBS Medicare Part D.  Understanding of regimen: good Understanding of indications: good Potential of compliance: good Patient understands to avoid NSAIDs. Patient understands to avoid decongestants.   Pertinent Lab Values: 05/04/21: Serum creatinine 1.8, BUN 36, Potassium 4, Sodium 134, Digoxin (04/25/21) 1.3   Vital Signs: Weight: 173.4 lbs (last clinic weight: 171.8 lbs) Blood pressure: 126/80  Heart rate: 66   Assessment/Plan: 1. CAD: Occluded mid LAD and 80% proximal stenosis in nondominant RCA in 11/2020. No intervention. No chest pain.  - Continue ASA 81 mg daily.  - Continue Repatha (intolerant of statins and Zetia). Good LDL in 12/2020.   2. Chronic systolic CHF: Ischemic cardiomyopathy. Echo in 11/2020 with EF < 20%, moderate-severe LV dilation, normal RV, severe LAE, severe MR. RHC in 11/2020 with CI 1.8. TEE in 01/2021 with EF <20%, dyssynchrony, mildly decreased RV systolic function. Echo in 02/2021 with EF < 20%, moderate MR.   - NYHA class II, he is not volume overloaded on exam.  - Continue torsemide 40 mg BID   - Continue  metoprolol succinate 12.5 mg daily.  - Continue losartan 12.5 mg daily - Increase spironolactone to 25 mg daily. Repeat BMET in 1 week at local doctor's office.  - Continue digoxin 0.0625 mg every other day (lower dose with CKD stage III). Repeat digoxin level in 1 week.  - He was unable to take Jardiance due to nausea, insurance will  not cover [facility] - Was wearing a Lifevest. Expressed interest CRT-D device. Was implanted on 05/07/21.  3. Mitral regurgitation: Severe on 12/2020 TTE. However, TEE in 01/2021 showed moderate functional central MR.    - MR not bad enough for Mitraclip, may improve with CRT.   4. Elevated LFTs: Viral hepatitis labs negative. Abdominal US did not show cirrhosis but did show ascites. Etiology uncertain, ?congestive hepatopathy.  - Repeat LFTs in 03/2021 were normal.   5. CKD stage 3: Follow closely with medication titration   6. Smoking: Active, previously encouraged him again to quit.   Follow-up in 1 week at local doctor's office for labs (BMET and digoxin level) and on 06/27/21 with Dr. Rush Farmer, PharmD, BCPS, [facility], [facility] Pharmacist [phone]

## 2021-05-16 NOTE — Patient Instructions (Signed)

## 2021-05-16 NOTE — Patient Instructions (Addendum)
It was a pleasure seeing you today!  MEDICATIONS: -We are changing your medications today -Increase spironolactone to 25 mg (1 tablet) daily. Please get labs drawn in 1 week at your doctor's office. Orders have been placed. -Call if you have questions about your medications.   NEXT APPOINTMENT: Return to clinic in 6 weeks with Dr. Aundra Dubin.  In general, to take care of your heart failure: -Limit your fluid intake to 2 Liters (half-gallon) per day.   -Limit your salt intake to ideally 2-3 grams (2000-3000 mg) per day. -Weigh yourself daily and record, and bring that "weight diary" to your next appointment.  (Weight gain of 2-3 pounds in 1 day typically means fluid weight.) -The medications for your heart are to help your heart and help you live longer.   -Please contact us before stopping any of your heart medications.  Call the clinic at [phone] with questions or to reschedule future appointments.

## 2021-05-17 ENCOUNTER — Ambulatory Visit: Payer: Medicare Other

## 2021-05-17 NOTE — Progress Notes (Signed)
Wound check appointment. Steri-strips removed. Wound without redness or edema. Incision edges approximated, wound well healed. Normal device function. Thresholds, sensing, and impedances consistent with implant measurements. Device programmed at 3.5V for extra safety margin until 3 month visit. Histogram distribution appropriate for patient and level of activity. No mode switches or ventricular arrhythmias noted. Patient educated about wound care, arm mobility, lifting restrictions, shock plan. ROV in 3 months with implanting physician.  Patient has PNS noted. Consulted with Bryan from [facility] and following changes made - LRL increased to 70 bpm ~ decreased PVC burden which is 7% - LV Pulse Amplitude changed 2.0V d/t PNS.  - LV Pulse Configuration Md 2-Proximal 4 - LV pulse width 0.7 ms - Secure Sense Off

## 2021-05-22 ENCOUNTER — Telehealth: Payer: Self-pay | Admitting: Cardiology

## 2021-05-22 ENCOUNTER — Encounter: Payer: Self-pay | Admitting: Oncology

## 2021-05-22 DIAGNOSIS — I5022 Chronic systolic (congestive) heart failure: Secondary | ICD-10-CM

## 2021-05-22 LAB — CUP PACEART INCLINIC DEVICE CHECK
Battery Remaining Longevity: 72 mo
Brady Statistic RA Percent Paced: 8.2 %
Brady Statistic RV Percent Paced: 93 %
Date Time Interrogation Session: 20220929111537
HighPow Impedance: 68.625
Implantable Lead Implant Date: 20220919
Implantable Lead Implant Date: 20220919
Implantable Lead Implant Date: 20220919
Implantable Lead Location: 753858
Implantable Lead Location: 753859
Implantable Lead Location: 753860
Implantable Pulse Generator Implant Date: 20220919
Lead Channel Impedance Value: 487.5 Ohm
Lead Channel Impedance Value: 512.5 Ohm
Lead Channel Impedance Value: 600 Ohm
Lead Channel Pacing Threshold Amplitude: 0.75 V
Lead Channel Pacing Threshold Amplitude: 0.75 V
Lead Channel Pacing Threshold Amplitude: 1 V
Lead Channel Pacing Threshold Amplitude: 1 V
Lead Channel Pacing Threshold Pulse Width: 0.5 ms
Lead Channel Pacing Threshold Pulse Width: 0.5 ms
Lead Channel Pacing Threshold Pulse Width: 0.7 ms
Lead Channel Pacing Threshold Pulse Width: 0.7 ms
Lead Channel Sensing Intrinsic Amplitude: 12 mV
Lead Channel Sensing Intrinsic Amplitude: 3 mV
Lead Channel Setting Pacing Amplitude: 2 V
Lead Channel Setting Pacing Amplitude: 3.5 V
Lead Channel Setting Pacing Amplitude: 3.5 V
Lead Channel Setting Pacing Pulse Width: 0.5 ms
Lead Channel Setting Pacing Pulse Width: 0.7 ms
Lead Channel Setting Sensing Sensitivity: 0.5 mV
Pulse Gen Serial Number: 111046561

## 2021-05-22 NOTE — Telephone Encounter (Signed)
Wife of the patient wanted to speak to [place] about the patient's labs. Please call the wife

## 2021-05-22 NOTE — Telephone Encounter (Signed)
Labs have been reordered for [facility].

## 2021-05-23 LAB — BASIC METABOLIC PANEL
BUN/Creatinine Ratio: 20 (ref 10–24)
BUN: 35 mg/dL — ABNORMAL HIGH (ref 8–27)
CO2: 25 mmol/L (ref 20–29)
Calcium: 9.1 mg/dL (ref 8.6–10.2)
Chloride: 101 mmol/L (ref 96–106)
Creatinine, Ser: 1.74 mg/dL — ABNORMAL HIGH (ref 0.76–1.27)
Glucose: 96 mg/dL (ref 70–99)
Potassium: 4.1 mmol/L (ref 3.5–5.2)
Sodium: 144 mmol/L (ref 134–144)
eGFR: 39 mL/min/{1.73_m2} — ABNORMAL LOW (ref >59)

## 2021-05-23 LAB — DIGOXIN LEVEL: Digoxin, Serum: 0.6 ng/mL (ref 0.5–0.9)

## 2021-05-30 ENCOUNTER — Institutional Professional Consult (permissible substitution): Payer: Medicare Other | Admitting: Internal Medicine

## 2021-05-31 ENCOUNTER — Other Ambulatory Visit: Payer: Medicare Other

## 2021-06-01 ENCOUNTER — Ambulatory Visit: Payer: Medicare Other

## 2021-06-01 ENCOUNTER — Ambulatory Visit: Payer: Medicare Other | Admitting: Oncology

## 2021-06-04 ENCOUNTER — Encounter: Payer: Self-pay | Admitting: Oncology

## 2021-06-05 NOTE — Progress Notes (Signed)
[facility]  [address] [place],  [place]  [address] [phone]  Clinic Day:  06/08/2021  Referring physician: Serita Grammes, MD  This document serves as a record of services personally performed by Dequincy Macarthur Critchley, MD. It was created on their behalf by [facility] E, a trained medical scribe. The creation of this record is based on the scribe's personal observations and the provider's statements to them.  HISTORY OF PRESENT ILLNESS:  The patient is a 80 y.o. male with metastatic prostate cancer, who is currently taking Trelstar/enzalutamide for his complete androgen blockade therapy.  He also is taking Xgeva to protect his bones against worsening disease metastasis.  He comes in today for routine followup.  Since his last visit, the patient has been doing very well.  He complains of lower back/hip pain, which primarily affects him in the morning.  He denies having other systemic symptoms which concern him for overt progression of his metastatic prostate cancer.  PHYSICAL EXAM:  Blood pressure (!) 121/58, pulse 70, temperature 97.6 F (36.4 C), resp. rate 16, height 5\' 9"  (1.753 m), weight 176 lb 3.2 oz (79.9 kg), SpO2 100 %. Wt Readings from Last 3 Encounters:  06/08/21 176 lb (79.8 kg)  06/08/21 176 lb 3.2 oz (79.9 kg)  05/16/21 173 lb 6.4 oz (78.7 kg)   Body mass index is 26.02 kg/m. Performance status (ECOG): 1 - Symptomatic but completely ambulatory Physical Exam Constitutional:      Appearance: Normal appearance. He is not ill-appearing.     Comments: He has problems with memory recollection  HENT:     Mouth/Throat:     Mouth: Mucous membranes are moist.     Pharynx: Oropharynx is clear. No oropharyngeal exudate or posterior oropharyngeal erythema.  Cardiovascular:     Rate and Rhythm: Normal rate and regular rhythm.     Heart sounds: No murmur heard.   No friction rub. No gallop.  Pulmonary:     Effort: Pulmonary effort is  normal. No respiratory distress.     Breath sounds: Normal breath sounds. No wheezing, rhonchi or rales.  Abdominal:     General: Bowel sounds are normal. There is no distension.     Palpations: Abdomen is soft. There is no mass.     Tenderness: There is no abdominal tenderness.  Musculoskeletal:        General: No swelling.     Right lower leg: No edema.     Left lower leg: No edema.  Lymphadenopathy:     Cervical: No cervical adenopathy.     Upper Body:     Right upper body: No supraclavicular or axillary adenopathy.     Left upper body: No supraclavicular or axillary adenopathy.     Lower Body: No right inguinal adenopathy. No left inguinal adenopathy.  Skin:    General: Skin is warm.     Coloration: Skin is not jaundiced.     Findings: No lesion or rash.  Neurological:     General: No focal deficit present.     Mental Status: He is alert and oriented to person, place, and time. Mental status is at baseline.     Cranial Nerves: Cranial nerves are intact.  Psychiatric:        Mood and Affect: Mood normal.        Behavior: Behavior normal.        Thought Content: Thought content normal.    LABS:   CBC Latest Ref Rng &  Units 06/07/2021 05/03/2021 04/06/2021  WBC - 5.3 5.9 6.6  Hemoglobin 13.5 - 17.5 43.5(A) 15.2 13.3(A)  Hematocrit 41 - 53 30(A) 44.9 40(A)  Platelets 150 - 399 224 248 210   CMP Latest Ref Rng & Units 06/07/2021 05/22/2021 05/04/2021  Glucose 70 - 99 mg/dL - 96 -  BUN 4 - 21 40(A) 35(H) 36(A)  Creatinine 0.6 - 1.3 2.0(A) 1.74(H) 1.8(A)  Sodium 137 - 147 137 144 134(A)  Potassium 3.4 - 5.3 3.7 4.1 4.0  Chloride 99 - 108 99 101 98(A)  CO2 13 - 22 29(A) 25 28(A)  Calcium 8.7 - 10.7 8.9 9.1 9.1  Total Protein 6.5 - 8.1 g/dL - - -  Total Bilirubin 0.3 - 1.2 mg/dL - - -  Alkaline Phos 25 - 125 91 - 78  AST 14 - 40 25 - 33  ALT 10 - 40 12 - 14   Lab Results  Component Value Date   PSA1 <0.1 06/07/2021   ASSESSMENT & PLAN:  Assessment/Plan:  A 80 y.o.  male with metastatic prostate cancer.  I am very pleased as his PSA remains at an undetectable level.  This reflects the continued efficacy of his Trelstar/enzalutamide therapy.  He will continue taking this regimen until there is evidence of disease progression.  He will also continue taking Xgeva monthly for protection against worsening metastatic bone disease.  I did have this gentleman go for x-rays of his hips and lower back, for which no obvious bone lesions were appreciated.  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  As he is doing well from a prostate cancer perspective, I will see him back in 3 months for repeat clinical assessment.  The patient understands all the plans discussed today and is in agreement with them.     I, Rita Ohara, am acting as scribe for Marice Potter, MD    I have reviewed this report as typed by the medical scribe, and it is complete and accurate.  Dequincy Macarthur Critchley, MD

## 2021-06-07 ENCOUNTER — Inpatient Hospital Stay: Payer: Medicare Other | Attending: Oncology

## 2021-06-07 ENCOUNTER — Other Ambulatory Visit: Payer: Self-pay | Admitting: Hematology and Oncology

## 2021-06-07 DIAGNOSIS — C61 Malignant neoplasm of prostate: Secondary | ICD-10-CM | POA: Diagnosis present

## 2021-06-07 DIAGNOSIS — C7951 Secondary malignant neoplasm of bone: Secondary | ICD-10-CM

## 2021-06-07 LAB — CBC AND DIFFERENTIAL
HCT: 30 — AB (ref 41–53)
Hemoglobin: 43.5 — AB (ref 13.5–17.5)
Neutrophils Absolute: 2.7
Platelets: 224 (ref 150–399)
WBC: 5.3

## 2021-06-07 LAB — CBC
MCV: 93 (ref 80–94)
RBC: 14.3 — AB (ref 3.87–5.11)

## 2021-06-07 LAB — BASIC METABOLIC PANEL
BUN: 40 — AB (ref 4–21)
CO2: 29 — AB (ref 13–22)
Chloride: 99 (ref 99–108)
Creatinine: 2 — AB (ref 0.6–1.3)
Glucose: 109
Potassium: 3.7 (ref 3.4–5.3)
Sodium: 137 (ref 137–147)

## 2021-06-07 LAB — COMPREHENSIVE METABOLIC PANEL
Albumin: 4.1 (ref 3.5–5.0)
Calcium: 8.9 (ref 8.7–10.7)

## 2021-06-07 LAB — HEPATIC FUNCTION PANEL
ALT: 12 (ref 10–40)
AST: 25 (ref 14–40)
Alkaline Phosphatase: 91 (ref 25–125)
Bilirubin, Total: 0.7

## 2021-06-08 ENCOUNTER — Inpatient Hospital Stay: Payer: Medicare Other

## 2021-06-08 ENCOUNTER — Ambulatory Visit: Payer: Medicare Other | Admitting: Oncology

## 2021-06-08 ENCOUNTER — Other Ambulatory Visit: Payer: Self-pay

## 2021-06-08 ENCOUNTER — Ambulatory Visit: Payer: Medicare Other

## 2021-06-08 ENCOUNTER — Inpatient Hospital Stay (INDEPENDENT_AMBULATORY_CARE_PROVIDER_SITE_OTHER): Payer: Medicare Other | Admitting: Oncology

## 2021-06-08 ENCOUNTER — Other Ambulatory Visit: Payer: Medicare Other

## 2021-06-08 ENCOUNTER — Other Ambulatory Visit: Payer: Self-pay | Admitting: Oncology

## 2021-06-08 VITALS — BP 121/58 | HR 70 | Temp 97.6°F | Resp 16 | Ht 69.0 in | Wt 176.2 lb

## 2021-06-08 VITALS — BP 112/61 | HR 70 | Temp 98.0°F | Resp 18 | Ht 69.0 in | Wt 176.0 lb

## 2021-06-08 DIAGNOSIS — C7952 Secondary malignant neoplasm of bone marrow: Secondary | ICD-10-CM

## 2021-06-08 DIAGNOSIS — C7951 Secondary malignant neoplasm of bone: Secondary | ICD-10-CM | POA: Diagnosis not present

## 2021-06-08 DIAGNOSIS — C61 Malignant neoplasm of prostate: Secondary | ICD-10-CM

## 2021-06-08 LAB — PROSTATE-SPECIFIC AG, SERUM (LABCORP): Prostate Specific Ag, Serum: <0.1 ng/mL (ref 0.0–4.0)

## 2021-06-08 MED ORDER — DENOSUMAB 120 MG/1.7ML ~~LOC~~ SOLN
120.0000 mg | Freq: Once | SUBCUTANEOUS | Status: AC
  Administered 2021-06-08: 120 mg via SUBCUTANEOUS
  Filled 2021-06-08: qty 1.7

## 2021-06-08 NOTE — Patient Instructions (Signed)
Denosumab injection What is this medication? DENOSUMAB (den oh sue mab) slows bone breakdown. Prolia is used to treat osteoporosis in women after menopause and in men, and in people who are taking corticosteroids for 6 months or more. Xgeva is used to treat a high calcium level due to cancer and to prevent bone fractures and other bone problems caused by multiple myeloma or cancer bone metastases. Xgeva is also used to treat giant cell tumor of the bone. This medicine may be used for other purposes; ask your health care provider or pharmacist if you have questions. COMMON BRAND NAME(S): Prolia, XGEVA What should I tell my care team before I take this medication? They need to know if you have any of these conditions: dental disease having surgery or tooth extraction infection kidney disease low levels of calcium or Vitamin D in the blood malnutrition on hemodialysis skin conditions or sensitivity thyroid or parathyroid disease an unusual reaction to denosumab, other medicines, foods, dyes, or preservatives pregnant or trying to get pregnant breast-feeding How should I use this medication? This medicine is for injection under the skin. It is given by a health care professional in a hospital or clinic setting. A special MedGuide will be given to you before each treatment. Be sure to read this information carefully each time. For Prolia, talk to your pediatrician regarding the use of this medicine in children. Special care may be needed. For Xgeva, talk to your pediatrician regarding the use of this medicine in children. While this drug may be prescribed for children as young as 13 years for selected conditions, precautions do apply. Overdosage: If you think you have taken too much of this medicine contact a poison control center or emergency room at once. NOTE: This medicine is only for you. Do not share this medicine with others. What if I miss a dose? It is important not to miss your dose.  Call your doctor or health care professional if you are unable to keep an appointment. What may interact with this medication? Do not take this medicine with any of the following medications: other medicines containing denosumab This medicine may also interact with the following medications: medicines that lower your chance of fighting infection steroid medicines like prednisone or cortisone This list may not describe all possible interactions. Give your health care provider a list of all the medicines, herbs, non-prescription drugs, or dietary supplements you use. Also tell them if you smoke, drink alcohol, or use illegal drugs. Some items may interact with your medicine. What should I watch for while using this medication? Visit your doctor or health care professional for regular checks on your progress. Your doctor or health care professional may order blood tests and other tests to see how you are doing. Call your doctor or health care professional for advice if you get a fever, chills or sore throat, or other symptoms of a cold or flu. Do not treat yourself. This drug may decrease your body's ability to fight infection. Try to avoid being around people who are sick. You should make sure you get enough calcium and vitamin D while you are taking this medicine, unless your doctor tells you not to. Discuss the foods you eat and the vitamins you take with your health care professional. See your dentist regularly. Brush and floss your teeth as directed. Before you have any dental work done, tell your dentist you are receiving this medicine. Do not become pregnant while taking this medicine or for 5 months after   stopping it. Talk with your doctor or health care professional about your birth control options while taking this medicine. Women should inform their doctor if they wish to become pregnant or think they might be pregnant. There is a potential for serious side effects to an unborn child. Talk to  your health care professional or pharmacist for more information. What side effects may I notice from receiving this medication? Side effects that you should report to your doctor or health care professional as soon as possible: allergic reactions like skin rash, itching or hives, swelling of the face, lips, or tongue bone pain breathing problems dizziness jaw pain, especially after dental work redness, blistering, peeling of the skin signs and symptoms of infection like fever or chills; cough; sore throat; pain or trouble passing urine signs of low calcium like fast heartbeat, muscle cramps or muscle pain; pain, tingling, numbness in the hands or feet; seizures unusual bleeding or bruising unusually weak or tired Side effects that usually do not require medical attention (report to your doctor or health care professional if they continue or are bothersome): constipation diarrhea headache joint pain loss of appetite muscle pain runny nose tiredness upset stomach This list may not describe all possible side effects. Call your doctor for medical advice about side effects. You may report side effects to FDA at 1-800-FDA-1088. Where should I keep my medication? This medicine is only given in a clinic, doctor's office, or other health care setting and will not be stored at home. NOTE: This sheet is a summary. It may not cover all possible information. If you have questions about this medicine, talk to your doctor, pharmacist, or health care provider.  2022 Elsevier/Gold Standard (2017-12-12 16:10:44)

## 2021-06-11 ENCOUNTER — Ambulatory Visit: Payer: Medicare Other | Admitting: Oncology

## 2021-06-11 ENCOUNTER — Ambulatory Visit: Payer: Medicare Other

## 2021-06-12 ENCOUNTER — Telehealth: Payer: Self-pay

## 2021-06-12 NOTE — Telephone Encounter (Addendum)
06/12/21 @1209 - I spoke with pt's wife and notified her of beow. She verbalized understanding.    Per Dr Bobby Rumpf office note:  I did have this gentleman go for x-rays of his hips and lower back, for which no obvious bone lesions were appreciated.  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  As he is doing well from a prostate cancer perspective, I will see him back in 3 months for repeat clinical assessment.  The patient understands all the plans discussed today and is in agreement with them.

## 2021-06-18 ENCOUNTER — Telehealth: Payer: Self-pay | Admitting: Cardiology

## 2021-06-18 NOTE — Telephone Encounter (Signed)
Spoke to patient wife per DPR. She wants to confirm if patient can have water, electrode stimulation for neuropathy? She wants to confirm this is ok with him having a ICD. Will check with device team.

## 2021-06-18 NOTE — Telephone Encounter (Signed)
Advised it would be best for clinic doing the therapy to send a written request with the type of therapy so we can determine if it will interfere with patient device or not.

## 2021-06-18 NOTE — Telephone Encounter (Signed)
Patient's wife would like to discuss the patient having neuropathy stimulation for his hands and feet.

## 2021-06-22 ENCOUNTER — Telehealth: Payer: Self-pay

## 2021-06-22 NOTE — Telephone Encounter (Signed)
Request received from Dr. Ron Parker with [facility].  They would like to use a "ReBuilder" device on patient hands and feet.  The device is described as a cross between a TENS unit and EMS.  Data regarding device reviewed with Dr. Quentin Ore and industry.  Dr. Quentin Ore advised that he would not recommend use of this due to lack of testing and concern for interaction with patient's ICD.    Attempted to call Dr. Ron Parker office to advise.  No answer, they are closed today.  Spoke with pt wife [name] Pulse, advised of MD recommendation.    I will attempt to reach Dr. Ron Parker office when they are open.

## 2021-06-27 ENCOUNTER — Other Ambulatory Visit (HOSPITAL_COMMUNITY): Payer: Self-pay

## 2021-06-27 ENCOUNTER — Encounter (HOSPITAL_COMMUNITY): Payer: Self-pay | Admitting: Cardiology

## 2021-06-27 ENCOUNTER — Telehealth (HOSPITAL_COMMUNITY): Payer: Self-pay | Admitting: Pharmacy Technician

## 2021-06-27 ENCOUNTER — Ambulatory Visit (HOSPITAL_COMMUNITY)
Admission: RE | Admit: 2021-06-27 | Discharge: 2021-06-27 | Disposition: A | Discharge status: Home / Self Care | Payer: Medicare Other | Source: Ambulatory Visit | Attending: Cardiology | Admitting: Cardiology

## 2021-06-27 VITALS — BP 110/60 | HR 70 | Wt 179.4 lb

## 2021-06-27 DIAGNOSIS — N183 Chronic kidney disease, stage 3 unspecified: Secondary | ICD-10-CM | POA: Diagnosis not present

## 2021-06-27 DIAGNOSIS — Z923 Personal history of irradiation: Secondary | ICD-10-CM | POA: Diagnosis not present

## 2021-06-27 DIAGNOSIS — E785 Hyperlipidemia, unspecified: Secondary | ICD-10-CM | POA: Diagnosis not present

## 2021-06-27 DIAGNOSIS — I2510 Atherosclerotic heart disease of native coronary artery without angina pectoris: Secondary | ICD-10-CM | POA: Insufficient documentation

## 2021-06-27 DIAGNOSIS — I340 Nonrheumatic mitral (valve) insufficiency: Secondary | ICD-10-CM | POA: Insufficient documentation

## 2021-06-27 DIAGNOSIS — R0600 Dyspnea, unspecified: Secondary | ICD-10-CM | POA: Diagnosis not present

## 2021-06-27 DIAGNOSIS — Z79899 Other long term (current) drug therapy: Secondary | ICD-10-CM | POA: Insufficient documentation

## 2021-06-27 DIAGNOSIS — Z8546 Personal history of malignant neoplasm of prostate: Secondary | ICD-10-CM | POA: Diagnosis not present

## 2021-06-27 DIAGNOSIS — I5022 Chronic systolic (congestive) heart failure: Secondary | ICD-10-CM | POA: Diagnosis present

## 2021-06-27 DIAGNOSIS — R188 Other ascites: Secondary | ICD-10-CM | POA: Diagnosis not present

## 2021-06-27 DIAGNOSIS — R0602 Shortness of breath: Secondary | ICD-10-CM | POA: Insufficient documentation

## 2021-06-27 DIAGNOSIS — I130 Hypertensive heart and chronic kidney disease with heart failure and stage 1 through stage 4 chronic kidney disease, or unspecified chronic kidney disease: Secondary | ICD-10-CM | POA: Insufficient documentation

## 2021-06-27 DIAGNOSIS — I255 Ischemic cardiomyopathy: Secondary | ICD-10-CM | POA: Diagnosis present

## 2021-06-27 DIAGNOSIS — F17210 Nicotine dependence, cigarettes, uncomplicated: Secondary | ICD-10-CM | POA: Diagnosis not present

## 2021-06-27 DIAGNOSIS — C7951 Secondary malignant neoplasm of bone: Secondary | ICD-10-CM | POA: Insufficient documentation

## 2021-06-27 DIAGNOSIS — Z7982 Long term (current) use of aspirin: Secondary | ICD-10-CM | POA: Insufficient documentation

## 2021-06-27 LAB — LIPID PANEL
Cholesterol: 260 mg/dL — ABNORMAL HIGH (ref 0–200)
HDL: 56 mg/dL (ref >40)
LDL Cholesterol: 159 mg/dL — ABNORMAL HIGH (ref 0–99)
Total CHOL/HDL Ratio: 4.6 RATIO
Triglycerides: 226 mg/dL — ABNORMAL HIGH (ref <150)
VLDL: 45 mg/dL — ABNORMAL HIGH (ref 0–40)

## 2021-06-27 LAB — BASIC METABOLIC PANEL
Anion gap: 9 (ref 5–15)
BUN: 35 mg/dL — ABNORMAL HIGH (ref 8–23)
CO2: 29 mmol/L (ref 22–32)
Calcium: 9 mg/dL (ref 8.9–10.3)
Chloride: 99 mmol/L (ref 98–111)
Creatinine, Ser: 1.84 mg/dL — ABNORMAL HIGH (ref 0.61–1.24)
GFR, Estimated: 37 mL/min — ABNORMAL LOW (ref >60)
Glucose, Bld: 99 mg/dL (ref 70–99)
Potassium: 4.4 mmol/L (ref 3.5–5.1)
Sodium: 137 mmol/L (ref 135–145)

## 2021-06-27 LAB — DIGOXIN LEVEL: Digoxin Level: 0.4 ng/mL — ABNORMAL LOW (ref 0.8–2.0)

## 2021-06-27 MED ORDER — DAPAGLIFLOZIN PROPANEDIOL 10 MG PO TABS: 10.0000 mg | ORAL_TABLET | Freq: Every day | ORAL | 3 refills | Status: DC

## 2021-06-27 NOTE — Patient Instructions (Signed)
Labs done today. We will contact you only if your labs are abnormal.  START Tommy Ward 10mg  (1 tablet) by mouth daily.   No other medication changes were made. Please continue all current medications as prescribed.  Your physician recommends that you schedule a follow-up appointment in: 10 days for a lab only appointment and in 2 months with an echo prior to your exam.   If you have any questions or concerns before your next appointment please send [place] a message through [place] or call our office at [phone].    TO LEAVE A MESSAGE FOR THE NURSE SELECT OPTION 2, PLEASE LEAVE A MESSAGE INCLUDING: YOUR NAME DATE OF BIRTH CALL BACK NUMBER REASON FOR CALL**this is important as we prioritize the call backs  YOU WILL RECEIVE A CALL BACK THE SAME DAY AS LONG AS YOU CALL BEFORE 4:00 PM   Do the following things EVERYDAY: Weigh yourself in the morning before breakfast. Write it down and keep it in a log. Take your medicines as prescribed Eat low salt foods--Limit salt (sodium) to 2000 mg per day.  Stay as active as you can everyday Limit all fluids for the day to less than 2 liters   At the [facility], you and your health needs are our priority. As part of our continuing mission to provide you with exceptional heart care, we have created designated Provider Care Teams. These Care Teams include your primary Cardiologist (physician) and Advanced Practice Providers (APPs- Physician Assistants and Nurse Practitioners) who all work together to provide you with the care you need, when you need it.   You may see any of the following providers on your designated Care Team at your next follow up: Dr Glori Bickers Dr Haynes Kerns, NP Lyda Jester, [place] Audry Riles, PharmD   Please be sure to bring in all your medications bottles to every appointment.

## 2021-06-27 NOTE — Telephone Encounter (Signed)
Advanced Heart Failure Patient Advocate Encounter  Patient was seen in clinic today and started on Farxiga. (Unable to tolerate Jardiance due to nausea)   The current 30 day co-pay is $139, which is a barrier. Started an application for [place] assistance.   Will fax in once signatures are obtained.

## 2021-06-27 NOTE — Progress Notes (Signed)
PCP: Serita Grammes, MD Cardiology: Dr. [name] HF Cardiology: Dr. Aundra Dubin  80 y.o. with history of prostate cancer, CKD stage 3, CAD, and ischemic cardiomyopathy was referred by Dr. [name] for evaluation of CHF.  Patient developed gradually worsening exertional dyspnea starting early this year.  The shortness of breath has been especially bad for about 1.5 months.  He had an echo done in 4/22, showing EF < 20%, moderate-severe LV dilation, normal RV, severe MR.  LHC/RHC was done showing low cardiac index at 1.81 and occluded mid LAD.  No intervention.  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   TEE was done 6/22, showing EF < 20% with septal-lateral dyssynchrony, mildly decreased RV systolic function, moderate TR, moderate central MR with ERO 0.2 cm^2.  Echo in 7/22 similarly showed EF 20%, normal RV, moderate MR.  Patient had Medtronic CRT-D device implanted.   Patient returns for followup of CHF.  Feels better with CRT.  No significant exertional dyspnea.  Drove to [place] recently to visit family, had no limitations.  No chest pain.  No orthopnea/PND.  No lightheadedness.  Wife thinks he's doing better. Weight is up.  Still smoking.    Labs (5/22): K 3.4, creatinine 1.7 => 2.15, AST 123 => 189, ALT 322 =>190, tbili 1.7, Hgb 12, LDL 38 Labs (6/22): Viral hepatitis serologies negative Labs (8/22): K 4.2, creatinine 1.87 Labs (10/22): K 3.7, creatinine 2  PMH:  1. Hyperlipidemia 2. HTN 3. CKD stage 3 4. 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  Treated with radiation and currently on denosumab.   5. Cholecystectomy 6. CAD: LHC in 4/22 with 80% proximal RCA stenosis (nondominant), totally occluded mLAD with collaterals, 60% D1.  No intervention.  7. Chronic systolic CHF: Ischemic cardiomyopathy.  Medtronic CRT-D.  - Echo (4/22) with EF < 20%, moderate-severe LV dilation, normal RV, severe LAE, severe  MR.  - RHC (4/22): mean RA 3, PA 39/10, mean PCWP 12, CI 1.81 - Echo (6/22): EF < 20% with septal-lateral dyssynchrony, mildly decreased RV systolic function, moderate TR, moderate central MR with ERO 0.2 cm^2.  - Echo (7/22): EF <20%, mild LVH, normal RV, moderate MR.  8. Mitral regurgitation: Severe on 4/22 echo.  - TEE (6/22) with moderate central MR with ERO 0.2 cm^2 (functional, annular dilatation).  9. Elevated LFTs: Viral hepatitis labs negative.  RUQ [place] (6/22) was not suggestive of cirrhosis, ascites noted.  10. Hypercalcemia: Due to Ca supplementation in setting of renal dysfunction.  11. Chronic LBBB  Social History   Socioeconomic History   Marital status: Married    Spouse name: Not on file   Number of children: Not on file   Years of education: Not on file   Highest education level: Not on file  Occupational History   Not on file  Tobacco Use   Smoking status: Every Day    Packs/day: 0.50    Years: 50.00    Pack years: 25.00    Types: Cigarettes   Smokeless tobacco: Never  Vaping Use   Vaping Use: Never used  Substance and Sexual Activity   Alcohol use: Not Currently   Drug use: Never   Sexual activity: Not on file  Other Topics Concern   Not on file  Social History Narrative   Not on file   Social Determinants of Health   Financial Resource Strain: Not on file  Food Insecurity: Not on file  Transportation Needs: Not on  file  Physical Activity: Not on file  Stress: Not on file  Social Connections: Not on file  Intimate Partner Violence: Not on file   Family History  Problem Relation Age of Onset   Leukemia Mother    Prostate cancer Father    Heart attack Brother    Colon cancer Neg Hx    Rectal cancer Neg Hx    Stomach cancer Neg Hx    Esophageal cancer Neg Hx    ROS: All systems reviewed and negative except as per HPI.   Current Outpatient Medications  Medication Sig Dispense Refill   acetaminophen (TYLENOL) 500 MG tablet Take 500 mg by  mouth every 6 (six) hours as needed for moderate pain or headache.     aspirin EC 81 MG tablet Take 1 tablet (81 mg total) by mouth daily. 90 tablet 3   cetirizine (ZYRTEC) 10 MG tablet Take 10 mg by mouth daily as needed for allergies.     cholestyramine light (PREVALITE) 4 GM/DOSE powder Take 4 g by mouth daily. As needed     dapagliflozin propanediol (FARXIGA) 10 MG TABS tablet Take 1 tablet (10 mg total) by mouth daily before breakfast. 90 tablet 3   denosumab (XGEVA) 120 MG/1.7ML SOLN injection Inject 120 mg into the skin every 30 (thirty) days.     digoxin (LANOXIN) 0.125 MG tablet Take 0.5 tablets (0.0625 mg total) by mouth every other day. 15 tablet 0   enzalutamide (XTANDI) 40 MG tablet Take 160 mg by mouth daily.     Evolocumab 140 MG/ML SOAJ Inject 140 mg into the skin every 14 (fourteen) days.     losartan (COZAAR) 25 MG tablet Take 0.5 tablets (12.5 mg total) by mouth at bedtime. 45 tablet 3   metoprolol succinate (TOPROL XL) 25 MG 24 hr tablet Take 0.5 tablets (12.5 mg total) by mouth daily. 45 tablet 3   omeprazole (PRILOSEC) 40 MG capsule Take 40 mg by mouth daily.     ondansetron (ZOFRAN) 4 MG tablet Take 1 tablet (4 mg total) by mouth every 8 (eight) hours as needed for nausea or vomiting. 20 tablet 0   pregabalin (LYRICA) 100 MG capsule Take 100 mg by mouth 2 (two) times daily.     spironolactone (ALDACTONE) 25 MG tablet Take 1 tablet (25 mg total) by mouth daily. 30 tablet 11   tamsulosin (FLOMAX) 0.4 MG CAPS capsule Take 0.4 mg by mouth daily.     torsemide (DEMADEX) 20 MG tablet Take 2 tablets (40 mg total) by mouth 2 (two) times daily. 60 tablet    No current facility-administered medications for this encounter.   BP 110/60   Pulse 70   Wt 81.4 kg (179 lb 6.4 oz)   SpO2 97%   BMI 26.49 kg/m  General: NAD Neck: No JVD, no thyromegaly or thyroid nodule.  Lungs: Clear to auscultation bilaterally with normal respiratory effort. CV: Nondisplaced PMI.  Heart regular  S1/S2, no S3/S4, no murmur.  No peripheral edema.  No carotid bruit.  Normal pedal pulses.  Abdomen: Soft, nontender, no hepatosplenomegaly, no distention.  Skin: Intact without lesions or rashes.  Neurologic: Alert and oriented x 3.  Psych: Normal affect. Extremities: No clubbing or cyanosis.  HEENT: Normal.   Assessment/Plan: 1. CAD: Occluded mid LAD and 80% proximal stenosis in nondominant RCA in 4/22.  No intervention.  No chest pain.  - Continue ASA 81 mg daily.  - Continue Repatha (intolerant of statins and Zetia).  Check lipids.  2. Chronic systolic CHF: Ischemic cardiomyopathy.  Echo in 4/22 with EF < 20%, moderate-severe LV dilation, normal RV, severe LAE, severe MR.  RHC in 4/22 with CI 1.8.  TEE in 6/22 with EF <20%, dyssynchrony, mildly decreased RV systolic function.  Echo in 7/22 with EF < 20%, moderate MR.  Medtronic CRT-D device placed.  NYHA class I-II, he is not volume overloaded on exam. GDMT limited by orthostatic symptoms.  - Continue Toprol XL 12.5 mg daily.  - He did not tolerate Jardiance due to nausea, I will start him on Farxiga 10 mg daily with BMET today and in 10 days.    - Continue spironolactone 25 mg daily.  - Continue digoxin 0.0625 mg daily (lower dose with CKD stage III), check level today.   - Continue losartan 12.5 qhs.  He did not tolerate Entresto due to orthostatic symptoms.  - Continue torsemide to 40 mg bid.  - I will repeat echo at followup in 2 months, look for improvement post-CRT.  3. Mitral regurgitation: Severe on 5/22 TTE. However, TEE in 6/22 showed moderate functional central MR.    - MR not bad enough for Mitraclip, may improve with CRT.  Echo in 2 months as above.  4. Elevated LFTs: Viral hepatitis labs negative.  Abdominal US did not show cirrhosis but did show ascites.  Etiology uncertain, ?congestive hepatopathy.  - Repeat LFTs in 8/22 were normal.  5. CKD stage 3: Follow closely with medication titration  6. Smoking: Active, encouraged  him again to quit.   Followup in 2 months with me.   Loralie Champagne 06/27/2021

## 2021-07-02 ENCOUNTER — Encounter (HOSPITAL_COMMUNITY): Payer: Self-pay

## 2021-07-03 ENCOUNTER — Other Ambulatory Visit (HOSPITAL_COMMUNITY): Payer: Self-pay

## 2021-07-04 NOTE — Telephone Encounter (Signed)
Sent in application via fax 15/97.   Advanced Heart Failure Patient Advocate Encounter   Patient was approved to receive Farxiga from [employer]  Patient ID: [id number] Effective dates: 07/03/21 through 08/18/22  Called and spoke with the patient's wife.  [name], CPhT

## 2021-07-09 ENCOUNTER — Other Ambulatory Visit: Payer: Self-pay | Admitting: *Deleted

## 2021-07-09 ENCOUNTER — Other Ambulatory Visit: Payer: Medicare Other

## 2021-07-09 ENCOUNTER — Other Ambulatory Visit: Payer: Self-pay

## 2021-07-09 ENCOUNTER — Ambulatory Visit: Payer: Medicare Other

## 2021-07-09 DIAGNOSIS — I5022 Chronic systolic (congestive) heart failure: Secondary | ICD-10-CM

## 2021-07-09 DIAGNOSIS — C7952 Secondary malignant neoplasm of bone marrow: Secondary | ICD-10-CM

## 2021-07-09 DIAGNOSIS — C7951 Secondary malignant neoplasm of bone: Secondary | ICD-10-CM

## 2021-07-09 DIAGNOSIS — C61 Malignant neoplasm of prostate: Secondary | ICD-10-CM

## 2021-07-10 LAB — BASIC METABOLIC PANEL
BUN/Creatinine Ratio: 19 (ref 10–24)
BUN: 39 mg/dL — ABNORMAL HIGH (ref 8–27)
CO2: 24 mmol/L (ref 20–29)
Calcium: 9.1 mg/dL (ref 8.6–10.2)
Chloride: 98 mmol/L (ref 96–106)
Creatinine, Ser: 2.03 mg/dL — ABNORMAL HIGH (ref 0.76–1.27)
Glucose: 113 mg/dL — ABNORMAL HIGH (ref 70–99)
Potassium: 4.3 mmol/L (ref 3.5–5.2)
Sodium: 138 mmol/L (ref 134–144)
eGFR: 33 mL/min/{1.73_m2} — ABNORMAL LOW (ref >59)

## 2021-07-16 ENCOUNTER — Other Ambulatory Visit: Payer: Self-pay | Admitting: Hematology and Oncology

## 2021-07-16 ENCOUNTER — Inpatient Hospital Stay: Payer: Medicare Other

## 2021-07-16 ENCOUNTER — Other Ambulatory Visit: Payer: Self-pay

## 2021-07-16 ENCOUNTER — Inpatient Hospital Stay: Payer: Medicare Other | Attending: Oncology

## 2021-07-16 VITALS — BP 140/60 | HR 74 | Temp 97.5°F | Resp 20 | Wt 174.1 lb

## 2021-07-16 DIAGNOSIS — C61 Malignant neoplasm of prostate: Secondary | ICD-10-CM | POA: Diagnosis present

## 2021-07-16 DIAGNOSIS — C7951 Secondary malignant neoplasm of bone: Secondary | ICD-10-CM | POA: Insufficient documentation

## 2021-07-16 DIAGNOSIS — C7952 Secondary malignant neoplasm of bone marrow: Secondary | ICD-10-CM

## 2021-07-16 LAB — BASIC METABOLIC PANEL
BUN: 44 — AB (ref 4–21)
CO2: 28 — AB (ref 13–22)
Chloride: 103 (ref 99–108)
Creatinine: 2.2 — AB (ref 0.6–1.3)
Glucose: 119
Potassium: 4 (ref 3.4–5.3)
Sodium: 138 (ref 137–147)

## 2021-07-16 LAB — CBC AND DIFFERENTIAL
HCT: 46 (ref 41–53)
Hemoglobin: 15.5 (ref 13.5–17.5)
Neutrophils Absolute: 2.92
Platelets: 278 (ref 150–399)
WBC: 5.5

## 2021-07-16 LAB — COMPREHENSIVE METABOLIC PANEL
Albumin: 4.3 (ref 3.5–5.0)
Calcium: 8.7 (ref 8.7–10.7)

## 2021-07-16 LAB — CBC: RBC: 5.01 (ref 3.87–5.11)

## 2021-07-16 LAB — HEPATIC FUNCTION PANEL
ALT: 16 (ref 10–40)
AST: 23 (ref 14–40)
Alkaline Phosphatase: 70 (ref 25–125)
Bilirubin, Total: 1

## 2021-07-16 LAB — PSA: Prostatic Specific Antigen: <0.01 ng/mL (ref 0.00–4.00)

## 2021-07-16 MED ORDER — DENOSUMAB 120 MG/1.7ML ~~LOC~~ SOLN
120.0000 mg | Freq: Once | SUBCUTANEOUS | Status: AC
  Administered 2021-07-16: 120 mg via SUBCUTANEOUS
  Filled 2021-07-16: qty 1.7

## 2021-07-16 NOTE — Progress Notes (Signed)
Patient is not taking calcium supplement because his cardiologist recommended that he stop taking them.  Calcium level is normal at 8.7.  Will continue to monitor.

## 2021-07-16 NOTE — Progress Notes (Signed)
Patient instructed to increase fluid intake. States understanding. Patient reports that he is not taking a Calcium supplement at this time at the advice of his cardiologist.

## 2021-07-16 NOTE — Patient Instructions (Signed)
Denosumab injection What is this medication? DENOSUMAB (den oh sue mab) slows bone breakdown. Prolia is used to treat osteoporosis in women after menopause and in men, and in people who are taking corticosteroids for 6 months or more. Xgeva is used to treat a high calcium level due to cancer and to prevent bone fractures and other bone problems caused by multiple myeloma or cancer bone metastases. Xgeva is also used to treat giant cell tumor of the bone. This medicine may be used for other purposes; ask your health care provider or pharmacist if you have questions. COMMON BRAND NAME(S): Prolia, XGEVA What should I tell my care team before I take this medication? They need to know if you have any of these conditions: dental disease having surgery or tooth extraction infection kidney disease low levels of calcium or Vitamin D in the blood malnutrition on hemodialysis skin conditions or sensitivity thyroid or parathyroid disease an unusual reaction to denosumab, other medicines, foods, dyes, or preservatives pregnant or trying to get pregnant breast-feeding How should I use this medication? This medicine is for injection under the skin. It is given by a health care professional in a hospital or clinic setting. A special MedGuide will be given to you before each treatment. Be sure to read this information carefully each time. For Prolia, talk to your pediatrician regarding the use of this medicine in children. Special care may be needed. For Xgeva, talk to your pediatrician regarding the use of this medicine in children. While this drug may be prescribed for children as young as 13 years for selected conditions, precautions do apply. Overdosage: If you think you have taken too much of this medicine contact a poison control center or emergency room at once. NOTE: This medicine is only for you. Do not share this medicine with others. What if I miss a dose? It is important not to miss your dose.  Call your doctor or health care professional if you are unable to keep an appointment. What may interact with this medication? Do not take this medicine with any of the following medications: other medicines containing denosumab This medicine may also interact with the following medications: medicines that lower your chance of fighting infection steroid medicines like prednisone or cortisone This list may not describe all possible interactions. Give your health care provider a list of all the medicines, herbs, non-prescription drugs, or dietary supplements you use. Also tell them if you smoke, drink alcohol, or use illegal drugs. Some items may interact with your medicine. What should I watch for while using this medication? Visit your doctor or health care professional for regular checks on your progress. Your doctor or health care professional may order blood tests and other tests to see how you are doing. Call your doctor or health care professional for advice if you get a fever, chills or sore throat, or other symptoms of a cold or flu. Do not treat yourself. This drug may decrease your body's ability to fight infection. Try to avoid being around people who are sick. You should make sure you get enough calcium and vitamin D while you are taking this medicine, unless your doctor tells you not to. Discuss the foods you eat and the vitamins you take with your health care professional. See your dentist regularly. Brush and floss your teeth as directed. Before you have any dental work done, tell your dentist you are receiving this medicine. Do not become pregnant while taking this medicine or for 5 months after   stopping it. Talk with your doctor or health care professional about your birth control options while taking this medicine. Women should inform their doctor if they wish to become pregnant or think they might be pregnant. There is a potential for serious side effects to an unborn child. Talk to  your health care professional or pharmacist for more information. What side effects may I notice from receiving this medication? Side effects that you should report to your doctor or health care professional as soon as possible: allergic reactions like skin rash, itching or hives, swelling of the face, lips, or tongue bone pain breathing problems dizziness jaw pain, especially after dental work redness, blistering, peeling of the skin signs and symptoms of infection like fever or chills; cough; sore throat; pain or trouble passing urine signs of low calcium like fast heartbeat, muscle cramps or muscle pain; pain, tingling, numbness in the hands or feet; seizures unusual bleeding or bruising unusually weak or tired Side effects that usually do not require medical attention (report to your doctor or health care professional if they continue or are bothersome): constipation diarrhea headache joint pain loss of appetite muscle pain runny nose tiredness upset stomach This list may not describe all possible side effects. Call your doctor for medical advice about side effects. You may report side effects to FDA at 1-800-FDA-1088. Where should I keep my medication? This medicine is only given in a clinic, doctor's office, or other health care setting and will not be stored at home. NOTE: This sheet is a summary. It may not cover all possible information. If you have questions about this medicine, talk to your doctor, pharmacist, or health care provider.  2022 Elsevier/Gold Standard (2017-12-12 00:00:00)

## 2021-07-24 NOTE — Progress Notes (Signed)
Patient was re-approved for free Xtandi through [employer] for year 2023.

## 2021-08-02 ENCOUNTER — Telehealth: Payer: Self-pay | Admitting: Oncology

## 2021-08-02 NOTE — Telephone Encounter (Signed)
Patient's 12/21 Rescheduled to afternoon due to overlapping another Appt

## 2021-08-06 ENCOUNTER — Ambulatory Visit (INDEPENDENT_AMBULATORY_CARE_PROVIDER_SITE_OTHER): Payer: Medicare Other

## 2021-08-06 DIAGNOSIS — I420 Dilated cardiomyopathy: Secondary | ICD-10-CM

## 2021-08-07 ENCOUNTER — Encounter: Payer: Self-pay | Admitting: Oncology

## 2021-08-07 LAB — CUP PACEART REMOTE DEVICE CHECK
Battery Remaining Longevity: 58 mo
Battery Remaining Percentage: 93 %
Battery Voltage: 2.98 V
Brady Statistic AP VP Percent: 58 %
Brady Statistic AP VS Percent: 2 %
Brady Statistic AS VP Percent: 35 %
Brady Statistic AS VS Percent: 1.7 %
Brady Statistic RA Percent Paced: 57 %
Date Time Interrogation Session: 20221219010728
HighPow Impedance: 78 Ohm
Implantable Lead Implant Date: 20220919
Implantable Lead Implant Date: 20220919
Implantable Lead Implant Date: 20220919
Implantable Lead Location: 753858
Implantable Lead Location: 753859
Implantable Lead Location: 753860
Implantable Pulse Generator Implant Date: 20220919
Lead Channel Impedance Value: 450 Ohm
Lead Channel Impedance Value: 490 Ohm
Lead Channel Impedance Value: 810 Ohm
Lead Channel Pacing Threshold Amplitude: 0.75 V
Lead Channel Pacing Threshold Amplitude: 0.75 V
Lead Channel Pacing Threshold Amplitude: 1 V
Lead Channel Pacing Threshold Pulse Width: 0.5 ms
Lead Channel Pacing Threshold Pulse Width: 0.5 ms
Lead Channel Pacing Threshold Pulse Width: 0.7 ms
Lead Channel Sensing Intrinsic Amplitude: 1.6 mV
Lead Channel Sensing Intrinsic Amplitude: 12 mV
Lead Channel Setting Pacing Amplitude: 2 V
Lead Channel Setting Pacing Amplitude: 3.5 V
Lead Channel Setting Pacing Amplitude: 3.5 V
Lead Channel Setting Pacing Pulse Width: 0.5 ms
Lead Channel Setting Pacing Pulse Width: 0.7 ms
Lead Channel Setting Sensing Sensitivity: 0.5 mV
Pulse Gen Serial Number: 111046561

## 2021-08-07 NOTE — Addendum Note (Signed)
Addended by: Juanetta Beets on: 08/07/2021 01:56 PM   Modules accepted: Orders

## 2021-08-08 ENCOUNTER — Ambulatory Visit: Payer: Medicare Other

## 2021-08-08 ENCOUNTER — Other Ambulatory Visit: Payer: Medicare Other

## 2021-08-10 ENCOUNTER — Ambulatory Visit (INDEPENDENT_AMBULATORY_CARE_PROVIDER_SITE_OTHER): Payer: Medicare Other | Admitting: Sports Medicine

## 2021-08-10 ENCOUNTER — Other Ambulatory Visit: Payer: Self-pay

## 2021-08-10 ENCOUNTER — Encounter: Payer: Self-pay | Admitting: Sports Medicine

## 2021-08-10 DIAGNOSIS — M79674 Pain in right toe(s): Secondary | ICD-10-CM

## 2021-08-10 DIAGNOSIS — B351 Tinea unguium: Secondary | ICD-10-CM | POA: Diagnosis not present

## 2021-08-10 DIAGNOSIS — G629 Polyneuropathy, unspecified: Secondary | ICD-10-CM

## 2021-08-10 DIAGNOSIS — M79675 Pain in left toe(s): Secondary | ICD-10-CM

## 2021-08-10 NOTE — Progress Notes (Signed)
Subjective: Tommy Ward is a 80 y.o. male patient seen today in office with complaint of mildly painful thickened and elongated toenails; unable to trim.  Denies any changes with medical history since last encounter.  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 Patient is assisted by wife this visit.  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[place]) 12/05/2020   Tobacco use 12/05/2020   Dilated cardiomyopathy ([place]) 12/05/2020   HFrEF (heart failure with reduced ejection fraction) ([place]) 11/23/2020   CAD (coronary artery disease) 11/23/2020   GERD (gastroesophageal reflux disease)    Hyperlipidemia    Hypertension    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[place]) 06/02/2020   Malignant neoplasm of prostate ([place]) 06/02/2020   AKI (acute kidney injury) ([place]) 11/25/2019   Hyperphosphatemia 11/25/2019   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/24/2019   Hyperkalemia 95/28/4132   Metabolic bone disease 44/08/270   Vitamin D deficiency 08/24/2019   Benign hypertension with chronic kidney disease, stage III ([place]) 05/20/2019   Decreased cardiac ejection fraction 05/20/2019   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 02/24/2019   Olecranon bursitis of left elbow 06/25/2018   B12 deficiency 03/22/2018   Gastroesophageal reflux disease without esophagitis 03/22/2018   Chronic renal insufficiency, stage III (moderate) ([place]) 11/27/2015   Aortic valve disorder 07/08/2014   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     amoxicillin (AMOXIL) 500 MG capsule Take 500 mg by mouth 3 (three) times daily.     aspirin EC 81 MG tablet Take 1 tablet (81 mg total) by mouth daily. 90 tablet 3   cetirizine (ZYRTEC) 10 MG tablet Take 10 mg by mouth daily as needed for allergies.     chlorhexidine (PERIDEX) 0.12 % solution SMARTSIG:By Mouth     cholestyramine light (PREVALITE) 4 GM/DOSE powder Take 4 g by mouth daily. As needed     dapagliflozin propanediol (FARXIGA) 10 MG TABS tablet Take 1 tablet (10 mg total) by mouth daily before breakfast. 90 tablet 3   denosumab (XGEVA) 120 MG/1.7ML SOLN injection Inject 120 mg into the skin every 30 (thirty) days.     digoxin (LANOXIN) 0.125 MG tablet Take 0.5 tablets (0.0625 mg total) by mouth every other day. 15 tablet 0   enzalutamide (XTANDI) 40 MG tablet Take 160 mg by mouth daily.     Evolocumab 140 MG/ML SOAJ Inject 140 mg into the skin every 14 (fourteen) days.     HYDROcodone-acetaminophen (NORCO/VICODIN) 5-325 MG tablet Take 1 tablet by mouth every 4 (four) hours as needed.     losartan (COZAAR) 25 MG  tablet Take 0.5 tablets (12.5 mg total) by mouth at bedtime. 45 tablet 3   metoprolol succinate (TOPROL XL) 25 MG 24 hr tablet Take 0.5 tablets (12.5 mg total) by mouth daily. 45 tablet 3   omeprazole (PRILOSEC) 40 MG capsule Take 40 mg by mouth daily.     ondansetron (ZOFRAN) 4 MG tablet Take 1 tablet (4 mg total) by mouth every 8 (eight) hours as needed for nausea or vomiting. 20  tablet 0   pregabalin (LYRICA) 100 MG capsule Take 100 mg by mouth 2 (two) times daily.     spironolactone (ALDACTONE) 25 MG tablet Take 1 tablet (25 mg total) by mouth daily. 30 tablet 11   tamsulosin (FLOMAX) 0.4 MG CAPS capsule Take 0.4 mg by mouth daily.     torsemide (DEMADEX) 20 MG tablet Take 2 tablets (40 mg total) by mouth 2 (two) times daily. 60 tablet    No current facility-administered medications on file prior to visit.    Allergies  Allergen Reactions   Ezetimibe Other (See Comments)    Myalgia   Gabapentin Itching and Other (See Comments)    Sore throat, breakouts   Statins Other (See Comments)    Myalgias (intolerance)    Objective: Physical Exam  General: Well developed, nourished, no acute distress, awake, alert and oriented x 3  Vascular: Dorsalis pedis artery 1/4 bilateral, Posterior tibial artery 1/4 bilateral, skin temperature warm to warm proximal to distal bilateral lower extremities, mild varicosities, scant pedal hair present bilateral.  Neurological: Gross sensation present via light touch bilateral.   Dermatological: Skin is warm, dry, and supple bilateral, Nails 1-10 are tender, long, thick, and discolored with mild to moderate subungal debris, with worse pain at the right great toe where there is pincer nail deformity, no webspace macerations present bilateral, no open lesions present bilateral, no callus/corns/hyperkeratotic tissue present bilateral. No signs of infection bilateral.  Musculoskeletal: Asymptomatic hallux rigidus worse on the right boney deformities noted bilateral. Muscular strength within normal limits without painon range of motion. No pain with calf compression bilateral.  Assessment and Plan:  Problem List Items Addressed This Visit   None Visit Diagnoses     Pain due to onychomycosis of toenails of both feet    -  Primary   Neuropathy           -Examined patient.  -Re-Discussed treatment options for painful mycotic  nails. -Mechanically debrided and reduced mycotic nails with sterile nail nipper and dremel nail file without incident. -Continue with Lyrica as given by previous provider for neuropathy -Patient to return in 3 months for follow up evaluation or sooner if symptoms worsen.  Landis Martins, DPM

## 2021-08-13 ENCOUNTER — Other Ambulatory Visit: Payer: Self-pay | Admitting: Internal Medicine

## 2021-08-13 DIAGNOSIS — E7849 Other hyperlipidemia: Secondary | ICD-10-CM

## 2021-08-13 DIAGNOSIS — I2510 Atherosclerotic heart disease of native coronary artery without angina pectoris: Secondary | ICD-10-CM

## 2021-08-14 ENCOUNTER — Telehealth: Payer: Self-pay

## 2021-08-14 ENCOUNTER — Inpatient Hospital Stay: Payer: Medicare Other

## 2021-08-14 ENCOUNTER — Other Ambulatory Visit: Payer: Medicare Other

## 2021-08-14 NOTE — Telephone Encounter (Addendum)
I notified pt's wife of below. ----- Message from Juanetta Beets, [name] sent at 08/14/2021  2:43 PM EST ----- Regarding: RE: Delton See I put the xgeva plan on HOLD and took out the appts.   ----- Message ----- From: Dairl Ponder, RN Sent: 08/14/2021   1:47 PM EST To: Juanetta Beets, RPH, Melodye Ped, NP Subject: Delton See                                          Received a call from pt's wife. Pt has been instructed to call us and request that he stop the xgeva until his dental work has healed. Pt had dental work done By Dr Hardie Shackleton in July 2022, and it still isn't healed. Dr Hardie Shackleton told them the area probably wouldn't heal while he is on the [place], and he recommends pt being off the xgeva for 4-5 months.

## 2021-08-15 ENCOUNTER — Encounter: Payer: Self-pay | Admitting: Cardiology

## 2021-08-15 ENCOUNTER — Other Ambulatory Visit: Payer: Self-pay

## 2021-08-15 ENCOUNTER — Ambulatory Visit (INDEPENDENT_AMBULATORY_CARE_PROVIDER_SITE_OTHER): Payer: Medicare Other | Admitting: Cardiology

## 2021-08-15 VITALS — BP 124/70 | HR 89 | Ht 69.0 in | Wt 175.2 lb

## 2021-08-15 DIAGNOSIS — Z95810 Presence of automatic (implantable) cardiac defibrillator: Secondary | ICD-10-CM | POA: Diagnosis not present

## 2021-08-15 DIAGNOSIS — I5022 Chronic systolic (congestive) heart failure: Secondary | ICD-10-CM | POA: Diagnosis not present

## 2021-08-15 DIAGNOSIS — I420 Dilated cardiomyopathy: Secondary | ICD-10-CM | POA: Diagnosis not present

## 2021-08-15 NOTE — Patient Instructions (Addendum)
Medication Instructions:  Your physician recommends that you continue on your current medications as directed. Please refer to the Current Medication list given to you today. *If you need a refill on your cardiac medications before your next appointment, please call your pharmacy*  Lab Work: None ordered. If you have labs (blood work) drawn today and your tests are completely normal, you will receive your results only by: [place] (if you have MyChart) OR A paper copy in the mail If you have any lab test that is abnormal or we need to change your treatment, we will call you to review the results.  Testing/Procedures: None ordered.  Follow-Up: At [facility], you and your health needs are our priority.  As part of our continuing mission to provide you with exceptional heart care, we have created designated Provider Care Teams.  These Care Teams include your primary Cardiologist (physician) and Advanced Practice Providers (APPs -  Physician Assistants and Nurse Practitioners) who all work together to provide you with the care you need, when you need it.  Your next appointment:   Your physician wants you to follow-up in: 9 months with Vickie Epley, MD  You will receive a reminder letter in the mail two months in advance. If you don't receive a letter, please call our office to schedule the follow-up appointment.  Remote monitoring is used to monitor your ICD from home. This monitoring reduces the number of office visits required to check your device to one time per year. It allows [place] to keep an eye on the functioning of your device to ensure it is working properly. You are scheduled for a device check from home on 11/05/2021. You may send your transmission at any time that day. If you have a wireless device, the transmission will be sent automatically. After your physician reviews your transmission, you will receive a postcard with your next transmission date.

## 2021-08-15 NOTE — Progress Notes (Signed)
Electrophysiology Office Follow up Visit Note:    Date:  08/15/2021   ID:  Tommy Ward, DOB 1941/03/21, MRN [id number]  PCP:  Serita Grammes, MD  [facility] Cardiologist:  Pixie Casino, MD  [facility] Electrophysiologist:  Vickie Epley, MD    Interval History:    Tommy Ward is a 80 y.o. male who presents for a follow up visit.  He had a BiV ICD implanted May 07, 2021.  The system is a [employer].  He is done well since implant.  He reports an improved exercise tolerance and less shortness of breath since the device was implanted.  No problems with the incision.     Past Medical History:  Diagnosis Date   Abnormal stress test 12/05/2020   AKI (acute kidney injury) ([place]) 11/25/2019   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[place]) 08/24/2019   Aortic valve disorder 07/08/2014   B12 deficiency 03/22/2018   Benign hypertension with chronic kidney disease, stage III ([place]) 05/20/2019   CAD (coronary artery disease) 11/23/2020   Cancer ([place]) 08/2008   prostate ca   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   Cataract 11/2013   bilateral   Chronic renal insufficiency, stage III (moderate) (HCC) 38/93/7342   Chronic systolic heart failure (HCC)    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 02/24/2019   Coronary arteriosclerosis 07/06/2012   Decreased cardiac ejection fraction 05/20/2019   Depressed left ventricular ejection fraction 12/05/2020   Dilated cardiomyopathy ([place]) 12/05/2020   Essential hypertension 07/06/2012   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   Gastroesophageal reflux disease without esophagitis 03/22/2018   GERD (gastroesophageal reflux disease)    HFrEF (heart failure with reduced ejection fraction) ([place]) 11/23/2020   Hypercalcemia of malignancy 02/16/2021   Hyperkalemia 08/24/2019   Hyperlipidemia    Hyperphosphatemia 11/25/2019   Hypertension    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    Left bundle branch block 07/06/2012   Malignant neoplasm of prostate ([place])    Malignant neoplasm of prostate ([place])    Metabolic bone disease 87/68/1157   Mixed hyperlipidemia 07/06/2012   Nonrheumatic mitral valve regurgitation 12/05/2020   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   Olecranon bursitis of left elbow 06/25/2018   Pre-diabetes 12/05/2020   Pulmonary hypertension ([place]) 12/05/2020   S/P radiation therapy > 12 wks ago 2010   prostate CA   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[place])    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[place]) 12/05/2020   Tobacco use 12/05/2020   Vitamin D deficiency 08/24/2019    Past Surgical History:  Procedure Laterality Date   BIV ICD INSERTION CRT-D N/A 05/07/2021   Procedure: BIV ICD INSERTION CRT-D;  Surgeon: Vickie Epley, MD;  Location: [facility];  Service: Cardiovascular;  Laterality: N/A;   CHOLECYSTECTOMY  01/2003   lap chole   left hip repaired     RIGHT/LEFT HEART CATH AND CORONARY ANGIOGRAPHY N/A 12/08/2020   Procedure: RIGHT/LEFT HEART CATH AND CORONARY ANGIOGRAPHY;  Surgeon: Wellington Hampshire, MD;  Location: [facility];  Service: Cardiovascular;  Laterality: N/A;   TEE WITHOUT CARDIOVERSION N/A 01/26/2021   Procedure: TRANSESOPHAGEAL ECHOCARDIOGRAM (TEE);  Surgeon: Larey Dresser, MD;  Location: [facility];  Service: Cardiovascular;  Laterality: N/A;    Current Medications: Current Meds  Medication Sig   acetaminophen (TYLENOL) 500 MG tablet Take 500 mg by mouth every 6 (six) hours as needed for moderate pain or headache.   amoxicillin (AMOXIL) 500  MG capsule Take 500 mg by mouth 3 (three) times daily.   aspirin EC 81 MG tablet Take 1 tablet (81 mg total) by mouth daily.   cetirizine (ZYRTEC) 10 MG tablet Take 10 mg by mouth daily as needed for allergies.   chlorhexidine (PERIDEX) 0.12 % solution SMARTSIG:By Mouth   cholestyramine light (PREVALITE) 4 GM/DOSE powder Take 4 g by mouth  daily. As needed   dapagliflozin propanediol (FARXIGA) 10 MG TABS tablet Take 1 tablet (10 mg total) by mouth daily before breakfast.   denosumab (XGEVA) 120 MG/1.7ML SOLN injection Inject 120 mg into the skin every 30 (thirty) days.   digoxin (LANOXIN) 0.125 MG tablet Take 0.5 tablets (0.0625 mg total) by mouth every other day.   enzalutamide (XTANDI) 40 MG tablet Take 160 mg by mouth daily.   Evolocumab (REPATHA SURECLICK) 295 MG/ML SOAJ INJECT 1 DOSE INTO THE SKIN EVERY 14 DAYS   HYDROcodone-acetaminophen (NORCO/VICODIN) 5-325 MG tablet Take 1 tablet by mouth every 4 (four) hours as needed.   metoprolol succinate (TOPROL XL) 25 MG 24 hr tablet Take 0.5 tablets (12.5 mg total) by mouth daily.   omeprazole (PRILOSEC) 40 MG capsule Take 40 mg by mouth daily.   ondansetron (ZOFRAN) 4 MG tablet Take 1 tablet (4 mg total) by mouth every 8 (eight) hours as needed for nausea or vomiting.   pregabalin (LYRICA) 100 MG capsule Take 100 mg by mouth 2 (two) times daily.   spironolactone (ALDACTONE) 25 MG tablet Take 1 tablet (25 mg total) by mouth daily.   tamsulosin (FLOMAX) 0.4 MG CAPS capsule Take 0.4 mg by mouth daily.   torsemide (DEMADEX) 20 MG tablet Take 2 tablets (40 mg total) by mouth 2 (two) times daily.     Allergies:   Ezetimibe, Gabapentin, and Statins   Social History   Socioeconomic History   Marital status: Married    Spouse name: Not on file   Number of children: Not on file   Years of education: Not on file   Highest education level: Not on file  Occupational History   Not on file  Tobacco Use   Smoking status: Every Day    Packs/day: 0.50    Years: 50.00    Pack years: 25.00    Types: Cigarettes   Smokeless tobacco: Never  Vaping Use   Vaping Use: Never used  Substance and Sexual Activity   Alcohol use: Not Currently   Drug use: Never   Sexual activity: Not on file  Other Topics Concern   Not on file  Social History Narrative   Not on file   Social Determinants  of Health   Financial Resource Strain: Not on file  Food Insecurity: Not on file  Transportation Needs: Not on file  Physical Activity: Not on file  Stress: Not on file  Social Connections: Not on file     Family History: The patient's family history includes Heart attack in his brother; Leukemia in his mother; Prostate cancer in his father. There is no history of Colon cancer, Rectal cancer, Stomach cancer, or Esophageal cancer.  ROS:   Please see the history of present illness.    All other systems reviewed and are negative.  EKGs/Labs/Other Studies Reviewed:    The following studies were reviewed today:  August 15, 2021 in clinic device interrogation and BiV optimization personally performed/reviewed Battery longevity 6.9 years Lead parameters are all stable BiV pacing 94% No therapies delivered Presenting rhythm a sensed, BiV pace Today we reprogrammed  the atrial lead output, decrease the LV RV interventricular pacing delay, increase the PV AB and decrease the right ventricular pulse amplitude.  EKG:  The ekg ordered today demonstrates: 9 EKGs were obtained today with various LV RV interventricular conduction delays and AV delays.  The final EKG configuration with an LV RV interventricular conduction delay of 30 ms and AV intervals at 190/150 had a QRS duration of 136 ms.  Recent Labs: 02/16/2021: Magnesium 2.4 07/16/2021: ALT 16; BUN 44; Creatinine 2.2; Hemoglobin 15.5; Platelets 278; Potassium 4.0; Sodium 138  Recent Lipid Panel    Component Value Date/Time   CHOL 260 (H) 06/27/2021 0946   CHOL 400 (H) 02/25/2019 0905   TRIG 226 (H) 06/27/2021 0946   HDL 56 06/27/2021 0946   HDL 51 02/25/2019 0905   CHOLHDL 4.6 06/27/2021 0946   VLDL 45 (H) 06/27/2021 0946   LDLCALC 159 (H) 06/27/2021 0946   LDLCALC 290 (H) 02/25/2019 0905    Physical Exam:    VS:  BP 124/70    Pulse 89    Ht 5\' 9"  (1.753 m)    Wt 175 lb 3.2 oz (79.5 kg)    SpO2 96%    BMI 25.87 kg/m     Wt  Readings from Last 3 Encounters:  08/15/21 175 lb 3.2 oz (79.5 kg)  07/16/21 174 lb 1.9 oz (79 kg)  06/27/21 179 lb 6.4 oz (81.4 kg)     GEN:  Well nourished, well developed in no acute distress HEENT: Normal NECK: No JVD; No carotid bruits LYMPHATICS: No lymphadenopathy CARDIAC: RRR, no murmurs, rubs, gallops.  CRT pocket well-healed RESPIRATORY:  Clear to auscultation without rales, wheezing or rhonchi  ABDOMEN: Soft, non-tender, non-distended MUSCULOSKELETAL:  No edema; No deformity  SKIN: Warm and dry NEUROLOGIC:  Alert and oriented x 3 PSYCHIATRIC:  Normal affect        ASSESSMENT:    1. Chronic systolic heart failure ([place])   2. Dilated cardiomyopathy ([place])   3. Cardiac resynchronization therapy defibrillator (CRT-D) in place    PLAN:    In order of problems listed above:   #Chronic systolic heart failure NYHA class II today.  Warm and dry on exam. Follows with heart failure clinic. Repeat echocardiogram planned for 1 to 2 months from now. Continue current medical therapy including Farxiga, digoxin, losartan, metoprolol, spironolactone and torsemide.  #CRT-D in place Device functioning appropriately.  Optimization performed during today's visit with VV timing adjusted.  Follow-up with me in 9 months or sooner as needed.       Total time spent with patient today 45 minutes. This includes reviewing records, evaluating the patient and coordinating care.   Medication Adjustments/Labs and Tests Ordered: Current medicines are reviewed at length with the patient today.  Concerns regarding medicines are outlined above.  Orders Placed This Encounter  Procedures   EKG 12-Lead   No orders of the defined types were placed in this encounter.    Signed, Lars Mage, MD, [facility], [facility] 08/15/2021 10:13 PM    Electrophysiology [facility]

## 2021-08-16 ENCOUNTER — Telehealth: Payer: Self-pay | Admitting: Internal Medicine

## 2021-08-16 NOTE — Telephone Encounter (Signed)
Tommy Ward with [facility] is following up regarding PA. She states coverage has been approved for 1 year, through 08/16/22.  Phone #: [phone] (option#: 5)

## 2021-08-16 NOTE — Telephone Encounter (Signed)
Approved Effective from 08/16/2021 through 08/16/2022

## 2021-08-16 NOTE — Telephone Encounter (Signed)
PA for repatha sureclick submitted via CMM (Key: [id number])

## 2021-08-16 NOTE — Progress Notes (Signed)
Remote ICD transmission.   

## 2021-08-29 ENCOUNTER — Other Ambulatory Visit: Payer: Self-pay

## 2021-08-29 ENCOUNTER — Other Ambulatory Visit (HOSPITAL_COMMUNITY): Payer: Self-pay

## 2021-08-29 ENCOUNTER — Ambulatory Visit (HOSPITAL_BASED_OUTPATIENT_CLINIC_OR_DEPARTMENT_OTHER)
Admission: RE | Admit: 2021-08-29 | Discharge: 2021-08-29 | Disposition: A | Discharge status: Home / Self Care | Payer: Medicare Other | Source: Ambulatory Visit | Attending: Cardiology | Admitting: Cardiology

## 2021-08-29 ENCOUNTER — Encounter (HOSPITAL_COMMUNITY): Payer: Self-pay | Admitting: Cardiology

## 2021-08-29 ENCOUNTER — Ambulatory Visit (HOSPITAL_COMMUNITY)
Admission: RE | Admit: 2021-08-29 | Discharge: 2021-08-29 | Disposition: A | Discharge status: Home / Self Care | Payer: Medicare Other | Source: Ambulatory Visit | Attending: Family Medicine | Admitting: Family Medicine

## 2021-08-29 VITALS — BP 112/60 | HR 70 | Wt 179.8 lb

## 2021-08-29 DIAGNOSIS — Z7901 Long term (current) use of anticoagulants: Secondary | ICD-10-CM | POA: Diagnosis not present

## 2021-08-29 DIAGNOSIS — I2510 Atherosclerotic heart disease of native coronary artery without angina pectoris: Secondary | ICD-10-CM | POA: Insufficient documentation

## 2021-08-29 DIAGNOSIS — I340 Nonrheumatic mitral (valve) insufficiency: Secondary | ICD-10-CM | POA: Diagnosis not present

## 2021-08-29 DIAGNOSIS — I130 Hypertensive heart and chronic kidney disease with heart failure and stage 1 through stage 4 chronic kidney disease, or unspecified chronic kidney disease: Secondary | ICD-10-CM | POA: Insufficient documentation

## 2021-08-29 DIAGNOSIS — Z7984 Long term (current) use of oral hypoglycemic drugs: Secondary | ICD-10-CM | POA: Insufficient documentation

## 2021-08-29 DIAGNOSIS — I5022 Chronic systolic (congestive) heart failure: Secondary | ICD-10-CM

## 2021-08-29 DIAGNOSIS — F17210 Nicotine dependence, cigarettes, uncomplicated: Secondary | ICD-10-CM | POA: Diagnosis not present

## 2021-08-29 DIAGNOSIS — I255 Ischemic cardiomyopathy: Secondary | ICD-10-CM | POA: Insufficient documentation

## 2021-08-29 DIAGNOSIS — I447 Left bundle-branch block, unspecified: Secondary | ICD-10-CM | POA: Diagnosis not present

## 2021-08-29 DIAGNOSIS — Z7982 Long term (current) use of aspirin: Secondary | ICD-10-CM | POA: Diagnosis not present

## 2021-08-29 DIAGNOSIS — E785 Hyperlipidemia, unspecified: Secondary | ICD-10-CM | POA: Insufficient documentation

## 2021-08-29 DIAGNOSIS — N183 Chronic kidney disease, stage 3 unspecified: Secondary | ICD-10-CM | POA: Diagnosis not present

## 2021-08-29 DIAGNOSIS — R188 Other ascites: Secondary | ICD-10-CM | POA: Insufficient documentation

## 2021-08-29 DIAGNOSIS — Z79899 Other long term (current) drug therapy: Secondary | ICD-10-CM | POA: Insufficient documentation

## 2021-08-29 LAB — BASIC METABOLIC PANEL
Anion gap: 10 (ref 5–15)
BUN: 36 mg/dL — ABNORMAL HIGH (ref 8–23)
CO2: 27 mmol/L (ref 22–32)
Calcium: 9.1 mg/dL (ref 8.9–10.3)
Chloride: 101 mmol/L (ref 98–111)
Creatinine, Ser: 2.11 mg/dL — ABNORMAL HIGH (ref 0.61–1.24)
GFR, Estimated: 31 mL/min — ABNORMAL LOW (ref >60)
Glucose, Bld: 117 mg/dL — ABNORMAL HIGH (ref 70–99)
Potassium: 4.2 mmol/L (ref 3.5–5.1)
Sodium: 138 mmol/L (ref 135–145)

## 2021-08-29 LAB — ECHOCARDIOGRAM COMPLETE
Area-P 1/2: 3.42 cm2
Calc EF: 18.5 %
MV M vel: 4.85 m/s
MV Peak grad: 94.1 mmHg
Radius: 0.5 cm
S' Lateral: 6.1 cm
Single Plane A2C EF: 21.2 %
Single Plane A4C EF: 14.9 %

## 2021-08-29 LAB — DIGOXIN LEVEL: Digoxin Level: 0.3 ng/mL — ABNORMAL LOW (ref 0.8–2.0)

## 2021-08-29 MED ORDER — TORSEMIDE 40 MG PO TABS: 40.0000 mg | ORAL_TABLET | Freq: Every day | ORAL | 11 refills | Status: DC

## 2021-08-29 NOTE — Patient Instructions (Signed)
Labs done today. We will contact you only if your labs are abnormal.  STOP taking Losartan  START Entresto 24-26mg  (1 tablet) by mouth 2 times daily.   DECREASE Lasix to 40mg  (1 tablet) by mouth daily.   No other medication changes were made. Please continue all current medications as prescribed.  Your physician recommends that you schedule a follow-up appointment in: 10 days for a lab only appointment and in 1 month with our NP/PA Clinic here in our office.   If you have any questions or concerns before your next appointment please send [place] a message through [place] or call our office at [phone].    TO LEAVE A MESSAGE FOR THE NURSE SELECT OPTION 2, PLEASE LEAVE A MESSAGE INCLUDING: YOUR NAME DATE OF BIRTH CALL BACK NUMBER REASON FOR CALL**this is important as we prioritize the call backs  YOU WILL RECEIVE A CALL BACK THE SAME DAY AS LONG AS YOU CALL BEFORE 4:00 PM   Do the following things EVERYDAY: Weigh yourself in the morning before breakfast. Write it down and keep it in a log. Take your medicines as prescribed Eat low salt foods--Limit salt (sodium) to 2000 mg per day.  Stay as active as you can everyday Limit all fluids for the day to less than 2 liters   At the [facility], you and your health needs are our priority. As part of our continuing mission to provide you with exceptional heart care, we have created designated Provider Care Teams. These Care Teams include your primary Cardiologist (physician) and Advanced Practice Providers (APPs- Physician Assistants and Nurse Practitioners) who all work together to provide you with the care you need, when you need it.   You may see any of the following providers on your designated Care Team at your next follow up: Dr Glori Bickers Dr Haynes Kerns, NP Lyda Jester, [place] Audry Riles, PharmD   Please be sure to bring in all your medications bottles to every appointment.

## 2021-08-29 NOTE — Progress Notes (Signed)
PCP: Serita Grammes, MD Cardiology: Dr. [name] HF Cardiology: Dr. Aundra Dubin  81 y.o. with history of prostate cancer, CKD stage 3, CAD, and ischemic cardiomyopathy was referred by Dr. [name] for evaluation of CHF.  Patient developed gradually worsening exertional dyspnea starting early this year.  The shortness of breath has been especially bad for about 1.5 months.  He had an echo done in 4/22, showing EF < 20%, moderate-severe LV dilation, normal RV, severe MR.  LHC/RHC was done showing low cardiac index at 1.81 and occluded mid LAD.  No intervention.  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   TEE was done 6/22, showing EF < 20% with septal-lateral dyssynchrony, mildly decreased RV systolic function, moderate TR, moderate central MR with ERO 0.2 cm^2.  Echo in 7/22 similarly showed EF 20%, normal RV, moderate MR.  Patient had Medtronic CRT-D device implanted.  Echo was done today and reviewed, EF <20% with moderate LV dilation, mildly decreased RV systolic function, normal IVC.   Patient returns for followup of CHF.  He has felt better since CRT device implanted.  No significant exertional dyspnea or chest pain.  No orthopnea/PND.  No lightheadedness.  Weight stable.    Labs (5/22): K 3.4, creatinine 1.7 => 2.15, AST 123 => 189, ALT 322 =>190, tbili 1.7, Hgb 12, LDL 38 Labs (6/22): Viral hepatitis serologies negative Labs (8/22): K 4.2, creatinine 1.87 Labs (10/22): K 3.7, creatinine 2 Labs (11/22): LDL 159 Labs (12/22): K 3.7, creatinine 2  PMH:  1. Hyperlipidemia 2. HTN 3. CKD stage 3 4. 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  Treated with radiation and currently on denosumab.   5. Cholecystectomy 6. CAD: LHC in 4/22 with 80% proximal RCA stenosis (nondominant), totally occluded mLAD with collaterals, 60% D1.  No intervention.  7. Chronic systolic CHF: Ischemic cardiomyopathy.  Medtronic CRT-D.  -  Echo (4/22) with EF < 20%, moderate-severe LV dilation, normal RV, severe LAE, severe MR.  - RHC (4/22): mean RA 3, PA 39/10, mean PCWP 12, CI 1.81 - Echo (6/22): EF < 20% with septal-lateral dyssynchrony, mildly decreased RV systolic function, moderate TR, moderate central MR with ERO 0.2 cm^2.  - Echo (7/22): EF <20%, mild LVH, normal RV, moderate MR.  - Echo (1/23: EF <20% with moderate LV dilation, mildly decreased RV systolic function, normal IVC, mild-moderate MR. 8. Mitral regurgitation: Severe on 4/22 echo.  - TEE (6/22) with moderate central MR with ERO 0.2 cm^2 (functional, annular dilatation).  - Echo (1/23) with mild-moderate MR.  9. Elevated LFTs: Viral hepatitis labs negative.  RUQ [place] (6/22) was not suggestive of cirrhosis, ascites noted.  10. Hypercalcemia: Due to Ca supplementation in setting of renal dysfunction.  11. Chronic LBBB  Social History   Socioeconomic History   Marital status: Married    Spouse name: Not on file   Number of children: Not on file   Years of education: Not on file   Highest education level: Not on file  Occupational History   Not on file  Tobacco Use   Smoking status: Every Day    Packs/day: 0.50    Years: 50.00    Pack years: 25.00    Types: Cigarettes   Smokeless tobacco: Never  Vaping Use   Vaping Use: Never used  Substance and Sexual Activity   Alcohol use: Not Currently   Drug use: Never   Sexual activity: Not on file  Other Topics Concern  Not on file  Social History Narrative   Not on file   Social Determinants of Health   Financial Resource Strain: Not on file  Food Insecurity: Not on file  Transportation Needs: Not on file  Physical Activity: Not on file  Stress: Not on file  Social Connections: Not on file  Intimate Partner Violence: Not on file   Family History  Problem Relation Age of Onset   Leukemia Mother    Prostate cancer Father    Heart attack Brother    Colon cancer Neg Hx    Rectal cancer Neg Hx     Stomach cancer Neg Hx    Esophageal cancer Neg Hx    ROS: All systems reviewed and negative except as per HPI.   Current Outpatient Medications  Medication Sig Dispense Refill   acetaminophen (TYLENOL) 500 MG tablet Take 500 mg by mouth every 6 (six) hours as needed for moderate pain or headache.     aspirin EC 81 MG tablet Take 1 tablet (81 mg total) by mouth daily. 90 tablet 3   cetirizine (ZYRTEC) 10 MG tablet Take 10 mg by mouth daily as needed for allergies.     chlorhexidine (PERIDEX) 0.12 % solution SMARTSIG:By Mouth     cholestyramine light (PREVALITE) 4 GM/DOSE powder Take 4 g by mouth daily. As needed     dapagliflozin propanediol (FARXIGA) 10 MG TABS tablet Take 1 tablet (10 mg total) by mouth daily before breakfast. 90 tablet 3   denosumab (XGEVA) 120 MG/1.7ML SOLN injection Inject 120 mg into the skin every 30 (thirty) days.     digoxin (LANOXIN) 0.125 MG tablet Take 0.5 tablets (0.0625 mg total) by mouth every other day. 15 tablet 0   enzalutamide (XTANDI) 40 MG tablet Take 160 mg by mouth daily.     Evolocumab (REPATHA SURECLICK) 751 MG/ML SOAJ INJECT 1 DOSE INTO THE SKIN EVERY 14 DAYS 6 mL 1   metoprolol succinate (TOPROL XL) 25 MG 24 hr tablet Take 0.5 tablets (12.5 mg total) by mouth daily. 45 tablet 3   omeprazole (PRILOSEC) 40 MG capsule Take 40 mg by mouth daily.     pregabalin (LYRICA) 100 MG capsule Take 100 mg by mouth 2 (two) times daily.     spironolactone (ALDACTONE) 25 MG tablet Take 1 tablet (25 mg total) by mouth daily. 30 tablet 11   tamsulosin (FLOMAX) 0.4 MG CAPS capsule Take 0.4 mg by mouth daily.     torsemide 40 MG TABS Take 40 mg by mouth daily. 30 tablet 11   No current facility-administered medications for this encounter.   BP 112/60    Pulse 70    Wt 81.6 kg (179 lb 12.8 oz)    SpO2 96%    BMI 26.55 kg/m  General: NAD Neck: No JVD, no thyromegaly or thyroid nodule.  Lungs: Clear to auscultation bilaterally with normal respiratory effort. CV:  Nondisplaced PMI.  Heart regular S1/S2, no S3/S4, no murmur.  No peripheral edema.  No carotid bruit.  Normal pedal pulses.  Abdomen: Soft, nontender, no hepatosplenomegaly, no distention.  Skin: Intact without lesions or rashes.  Neurologic: Alert and oriented x 3.  Psych: Normal affect. Extremities: No clubbing or cyanosis.  HEENT: Normal.   Assessment/Plan: 1. CAD: Occluded mid LAD and 80% proximal stenosis in nondominant RCA in 4/22.  No intervention.  No chest pain.  - Continue ASA 81 mg daily.  - Continue Repatha (intolerant of statins and Zetia).  Will need to recheck  lipids as he has been compliant with Repatha.  2. Chronic systolic CHF: Ischemic cardiomyopathy.  Echo in 4/22 with EF < 20%, moderate-severe LV dilation, normal RV, severe LAE, severe MR.  RHC in 4/22 with CI 1.8.  TEE in 6/22 with EF <20%, dyssynchrony, mildly decreased RV systolic function.  Echo in 7/22 with EF < 20%, moderate MR.  Medtronic CRT-D device placed.  Echo today showed EF < 20%, mild RV dysfunction.  No change to echo since CRT but he feels better symptomatically.  NYHA class I-II, he is not volume overloaded on exam.  No orthostatic symptoms since CRT implanted.  - Continue Toprol XL 12.5 mg daily.  - Continue Farxiga 10 mg daily.     - Continue spironolactone 25 mg daily.  - Continue digoxin 0.0625 mg daily (lower dose with CKD stage III), check level today.   - I will try him again on Entresto 24/26 bid.  Stop losartan.  BMET today and in 10 days.   - Decrease torsemide to 40 mg daily with initiation of Entresto.  3. Mitral regurgitation: Severe on 5/22 TTE. However, TEE in 6/22 showed moderate functional central MR.   Mild-moderate MR on echo today post-CRT.  4. Elevated LFTs: Viral hepatitis labs negative.  Abdominal US did not show cirrhosis but did show ascites.  Etiology uncertain, ?congestive hepatopathy.  - Repeat LFTs in 8/22 were normal.  5. CKD stage 3: Follow closely with medication titration   6. Smoking: Active, encouraged him again to quit.   Followup in 1 month with APP for medication titration.   Loralie Champagne 08/29/2021

## 2021-09-03 ENCOUNTER — Encounter (HOSPITAL_COMMUNITY): Payer: Self-pay

## 2021-09-03 NOTE — Progress Notes (Signed)
[facility]  [address] [place],  [place]  [address] [phone]  Clinic Day:  09/07/2021  Referring physician: Serita Grammes, MD  This document serves as a record of services personally performed by Other Atienza Macarthur Critchley, MD. It was created on their behalf by [facility] E, a trained medical scribe. The creation of this record is based on the scribe's personal observations and the provider's statements to them.  HISTORY OF PRESENT ILLNESS:  The patient is a 81 y.o. male with metastatic prostate cancer, who is currently supposed to be taking Trelstar/enzalutamide for his complete androgen blockade therapy.  However, he brings to my attention that he has not received his Trelstar shot at his urologist's office in numerous months.  He also is taking Xgeva to protect his bones against worsening disease metastasis.  He comes in today for routine followup.  Since his last visit, the patient has been doing well.  He denies having other systemic symptoms which concern him for overt progression of his metastatic prostate cancer.  PHYSICAL EXAM:  Blood pressure (!) 130/58, pulse 69, temperature 97.7 F (36.5 C), resp. rate 16, height 5\' 9"  (1.753 m), weight 182 lb 12.8 oz (82.9 kg), SpO2 96 %. Wt Readings from Last 3 Encounters:  09/07/21 182 lb 12.8 oz (82.9 kg)  08/29/21 179 lb 12.8 oz (81.6 kg)  08/15/21 175 lb 3.2 oz (79.5 kg)   Body mass index is 26.99 kg/m. Performance status (ECOG): 1 - Symptomatic but completely ambulatory Physical Exam Constitutional:      Appearance: Normal appearance. He is not ill-appearing.     Comments: He has problems with memory recollection  HENT:     Mouth/Throat:     Mouth: Mucous membranes are moist.     Pharynx: Oropharynx is clear. No oropharyngeal exudate or posterior oropharyngeal erythema.  Cardiovascular:     Rate and Rhythm: Normal rate and regular rhythm.     Heart sounds: No murmur heard.   No friction  rub. No gallop.  Pulmonary:     Effort: Pulmonary effort is normal. No respiratory distress.     Breath sounds: Normal breath sounds. No wheezing, rhonchi or rales.  Abdominal:     General: Bowel sounds are normal. There is no distension.     Palpations: Abdomen is soft. There is no mass.     Tenderness: There is no abdominal tenderness.  Musculoskeletal:        General: No swelling.     Right lower leg: No edema.     Left lower leg: No edema.  Lymphadenopathy:     Cervical: No cervical adenopathy.     Upper Body:     Right upper body: No supraclavicular or axillary adenopathy.     Left upper body: No supraclavicular or axillary adenopathy.     Lower Body: No right inguinal adenopathy. No left inguinal adenopathy.  Skin:    General: Skin is warm.     Coloration: Skin is not jaundiced.     Findings: No lesion or rash.  Neurological:     General: No focal deficit present.     Mental Status: He is alert and oriented to person, place, and time. Mental status is at baseline.  Psychiatric:        Mood and Affect: Mood normal.        Behavior: Behavior normal.        Thought Content: Thought content normal.    LABS:   CBC Latest Ref  Rng & Units 07/16/2021 06/07/2021 05/03/2021  WBC - 5.5 5.3 5.9  Hemoglobin 13.5 - 17.5 15.5 43.5(A) 15.2  Hematocrit 41 - 53 46 30(A) 44.9  Platelets 150 - 399 278 224 248   CMP Latest Ref Rng & Units 09/06/2021 08/29/2021 07/16/2021  Glucose 70 - 99 mg/dL 109(H) 117(H) -  BUN 8 - 27 mg/dL 35(H) 36(H) 44(A)  Creatinine 0.76 - 1.27 mg/dL 2.03(H) 2.11(H) 2.2(A)  Sodium 134 - 144 mmol/L 140 138 138  Potassium 3.5 - 5.2 mmol/L 5.1 4.2 4.0  Chloride 96 - 106 mmol/L 103 101 103  CO2 20 - 29 mmol/L 24 27 28(A)  Calcium 8.6 - 10.2 mg/dL 9.3 9.1 8.7  Total Protein 6.5 - 8.1 g/dL - - -  Total Bilirubin 0.3 - 1.2 mg/dL - - -  Alkaline Phos 25 - 125 - - 70  AST 14 - 40 - - 23  ALT 10 - 40 - - 16    Latest Reference Range & Units 09/07/21 13:44  Prostatic  Specific Antigen 0.00 - 4.00 ng/mL 0.01   ASSESSMENT & PLAN:  Assessment/Plan:  A 81 y.o. male with metastatic prostate cancer.  I am very pleased as his PSA remains at an undetectable level.  His level of .01 is still <.1.  Of note, the patient has not been receiving his androgen deprivation therapy at his urologist's office.  I explained to the patient that single-agent antiandrogen therapy usually is not standard of care in treating metastatic prostate cancer.  For clarity, I will get in contact with his urologist's office to ensure he was not given a 53-month shot, which would explain why he has not received a shot anytime recently.  For now, he will continue taking his enzalutamide daily as his PSA remains undetectable.  Clinically, the patient is doing well.  I will see him back in 3 months for repeat clinical assessment.  The patient understands all the plans discussed today and is in agreement with them.     I, Rita Ohara, am acting as scribe for Marice Potter, MD.  I have reviewed this report as typed by the medical scribe, and it is complete and accurate.  Mirca Yale Macarthur Critchley, MD

## 2021-09-06 ENCOUNTER — Telehealth (HOSPITAL_COMMUNITY): Payer: Self-pay | Admitting: Pharmacy Technician

## 2021-09-06 ENCOUNTER — Other Ambulatory Visit: Payer: Self-pay

## 2021-09-06 ENCOUNTER — Other Ambulatory Visit (HOSPITAL_COMMUNITY): Payer: Self-pay

## 2021-09-06 ENCOUNTER — Inpatient Hospital Stay: Payer: Medicare Other | Attending: Oncology

## 2021-09-06 DIAGNOSIS — C61 Malignant neoplasm of prostate: Secondary | ICD-10-CM | POA: Insufficient documentation

## 2021-09-06 DIAGNOSIS — C7951 Secondary malignant neoplasm of bone: Secondary | ICD-10-CM

## 2021-09-06 DIAGNOSIS — I272 Pulmonary hypertension, unspecified: Secondary | ICD-10-CM

## 2021-09-06 DIAGNOSIS — C801 Malignant (primary) neoplasm, unspecified: Secondary | ICD-10-CM

## 2021-09-06 DIAGNOSIS — I5022 Chronic systolic (congestive) heart failure: Secondary | ICD-10-CM

## 2021-09-06 NOTE — Progress Notes (Signed)
P 

## 2021-09-06 NOTE — Telephone Encounter (Signed)
Pt and his wife were in our office today for labs and states they had sent a message regarding Delene Loll and Jefm Miles but had never received a response. Please advise the pt.

## 2021-09-06 NOTE — Telephone Encounter (Signed)
Advanced Heart Failure Patient Advocate Encounter  Patient called and left message regarding Tommy Ward cost concerns. Called and left patient message to start [employer] assistance.   [name], CPhT

## 2021-09-06 NOTE — Telephone Encounter (Addendum)
Advanced Heart Failure Patient Advocate Encounter  Spoke with the patient's wife. They have an Entresto assistance application and are going to fax and the POI for the household over to me.   Will fax in once signatures are obtained.   While speaking with the patient's wife, she stated there was an issue with the torsemide. Looks like an update rx needs to be sent in with 20mg , not 40mg . Sent Heather Investment banker, corporate) that request to send an updated 90 day RX to [facility] in [place], [place].

## 2021-09-07 ENCOUNTER — Other Ambulatory Visit (HOSPITAL_COMMUNITY): Payer: Self-pay | Admitting: *Deleted

## 2021-09-07 ENCOUNTER — Inpatient Hospital Stay (INDEPENDENT_AMBULATORY_CARE_PROVIDER_SITE_OTHER): Payer: Medicare Other | Admitting: Oncology

## 2021-09-07 ENCOUNTER — Ambulatory Visit: Payer: Medicare Other

## 2021-09-07 ENCOUNTER — Other Ambulatory Visit: Payer: Self-pay | Admitting: Oncology

## 2021-09-07 VITALS — BP 130/58 | HR 69 | Temp 97.7°F | Resp 16 | Ht 69.0 in | Wt 182.8 lb

## 2021-09-07 DIAGNOSIS — C801 Malignant (primary) neoplasm, unspecified: Secondary | ICD-10-CM

## 2021-09-07 DIAGNOSIS — C61 Malignant neoplasm of prostate: Secondary | ICD-10-CM

## 2021-09-07 DIAGNOSIS — C7951 Secondary malignant neoplasm of bone: Secondary | ICD-10-CM | POA: Diagnosis present

## 2021-09-07 LAB — BASIC METABOLIC PANEL
BUN/Creatinine Ratio: 17 (ref 10–24)
BUN: 35 mg/dL — ABNORMAL HIGH (ref 8–27)
CO2: 24 mmol/L (ref 20–29)
Calcium: 9.3 mg/dL (ref 8.6–10.2)
Chloride: 103 mmol/L (ref 96–106)
Creatinine, Ser: 2.03 mg/dL — ABNORMAL HIGH (ref 0.76–1.27)
Glucose: 109 mg/dL — ABNORMAL HIGH (ref 70–99)
Potassium: 5.1 mmol/L (ref 3.5–5.2)
Sodium: 140 mmol/L (ref 134–144)
eGFR: 33 mL/min/{1.73_m2} — ABNORMAL LOW (ref >59)

## 2021-09-07 LAB — PSA: Prostatic Specific Antigen: 0.01 ng/mL (ref 0.00–4.00)

## 2021-09-07 MED ORDER — TORSEMIDE 20 MG PO TABS: 40.0000 mg | ORAL_TABLET | Freq: Two times a day (BID) | ORAL | 3 refills | Status: DC

## 2021-09-07 NOTE — Telephone Encounter (Signed)
Patient's wife sent her income but the fax was missing the patient's income for [facility] application. Will proceed with application process while she tries to get another social security statement mailed to them.

## 2021-09-09 ENCOUNTER — Encounter: Payer: Self-pay | Admitting: Oncology

## 2021-09-13 NOTE — Telephone Encounter (Signed)
Advanced Heart Failure Patient Advocate Encounter  The patient's current PAN HF grant was reinstated.  Patient ID Number: [id number] Group Number: [id number] Patient assistance start date will remain from 01/25/2021 and continues until 04/24/2022 Amount of Assistance Available: $1,000.00 BIN [id number] PCN [id number]

## 2021-09-25 ENCOUNTER — Other Ambulatory Visit (HOSPITAL_COMMUNITY): Payer: Self-pay

## 2021-09-25 MED ORDER — ENTRESTO 24-26 MG PO TABS: 1.0000 | ORAL_TABLET | Freq: Two times a day (BID) | ORAL | 3 refills | Status: DC

## 2021-09-25 MED ORDER — DAPAGLIFLOZIN PROPANEDIOL 10 MG PO TABS: 10.0000 mg | ORAL_TABLET | Freq: Every day | ORAL | 3 refills | Status: DC

## 2021-09-25 NOTE — Addendum Note (Signed)
Encounter addended by: Scarlette Calico, RN on: 09/25/2021 12:17 PM  Actions taken: Pharmacy for encounter modified, Order list changed

## 2021-09-25 NOTE — Telephone Encounter (Signed)
Advanced Heart Failure Patient Advocate Encounter  Called and spoke with the patient's wife. Copy of the grant was sent to her. She is aware that both [place] and Delene Loll can be billed to the grant. We will forego the assistance for now. Will circle back to assistance if the grant expires before the end of the year. Sent 90 day RX request to [employer] Investment banker, corporate) to send to [employer].  [name], CPhT

## 2021-09-27 ENCOUNTER — Other Ambulatory Visit (HOSPITAL_COMMUNITY): Payer: Self-pay

## 2021-09-27 NOTE — Progress Notes (Signed)
PCP: Serita Grammes, MD Cardiology: Dr. [name] HF Cardiology: Dr. Aundra Dubin  81 y.o. with history of prostate cancer, CKD stage 3, CAD, and ischemic cardiomyopathy was referred by Dr. [name] for evaluation of CHF.  Patient developed gradually worsening exertional dyspnea starting early this year.  The shortness of breath has been especially bad for about 1.5 months.  He had an echo done in 4/22, showing EF < 20%, moderate-severe LV dilation, normal RV, severe MR.  LHC/RHC was done showing low cardiac index at 1.81 and occluded mid LAD.  No intervention.  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   TEE was done 6/22, showing EF < 20% with septal-lateral dyssynchrony, mildly decreased RV systolic function, moderate TR, moderate central MR with ERO 0.2 cm^2.  Echo in 7/22 similarly showed EF 20%, normal RV, moderate MR.  Patient had Medtronic CRT-D device implanted.    Echo 1/23 showed EF <20% with moderate LV dilation, mildly decreased RV systolic function, normal IVC.   Today he returns for HF follow up with his wife. Overall feeling fine. He has continued to feel better since his CRT device implanted. He is not SOB walking on flat ground or with stairs. Denies palpitations, abnormal bleeding, CP, dizziness, edema, or PND/Orthopnea. Appetite ok. No fever or chills. Weight at home 178 pounds. Taking all medications. Smoking 1/2 ppd.  Labs (5/22): K 3.4, creatinine 1.7 => 2.15, AST 123 => 189, ALT 322 =>190, tbili 1.7, Hgb 12, LDL 38 Labs (6/22): Viral hepatitis serologies negative Labs (8/22): K 4.2, creatinine 1.87 Labs (10/22): K 3.7, creatinine 2 Labs (11/22): LDL 159 Labs (12/22): K 3.7, creatinine 2 Labs (1/23): K 5.1, creatinine 2.03  PMH:  1. Hyperlipidemia 2. HTN 3. CKD stage 3 4. 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  Treated with radiation and currently on denosumab.   5. Cholecystectomy 6. CAD:  LHC in 4/22 with 80% proximal RCA stenosis (nondominant), totally occluded mLAD with collaterals, 60% D1.  No intervention.  7. Chronic systolic CHF: Ischemic cardiomyopathy.  Medtronic CRT-D.  - Echo (4/22) with EF < 20%, moderate-severe LV dilation, normal RV, severe LAE, severe MR.  - RHC (4/22): mean RA 3, PA 39/10, mean PCWP 12, CI 1.81 - Echo (6/22): EF < 20% with septal-lateral dyssynchrony, mildly decreased RV systolic function, moderate TR, moderate central MR with ERO 0.2 cm^2.  - Echo (7/22): EF <20%, mild LVH, normal RV, moderate MR.  - Echo (1/23: EF <20% with moderate LV dilation, mildly decreased RV systolic function, normal IVC, mild-moderate MR. 8. Mitral regurgitation: Severe on 4/22 echo.  - TEE (6/22) with moderate central MR with ERO 0.2 cm^2 (functional, annular dilatation).  - Echo (1/23) with mild-moderate MR.  9. Elevated LFTs: Viral hepatitis labs negative.  RUQ [place] (6/22) was not suggestive of cirrhosis, ascites noted.  10. Hypercalcemia: Due to Ca supplementation in setting of renal dysfunction.  11. Chronic LBBB  Social History   Socioeconomic History   Marital status: Married    Spouse name: Not on file   Number of children: Not on file   Years of education: Not on file   Highest education level: Not on file  Occupational History   Not on file  Tobacco Use   Smoking status: Every Day    Packs/day: 0.50    Years: 50.00    Pack years: 25.00    Types: Cigarettes   Smokeless tobacco: Never  Vaping Use   Vaping  Use: Never used  Substance and Sexual Activity   Alcohol use: Not Currently   Drug use: Never   Sexual activity: Not on file  Other Topics Concern   Not on file  Social History Narrative   Not on file   Social Determinants of Health   Financial Resource Strain: Not on file  Food Insecurity: Not on file  Transportation Needs: Not on file  Physical Activity: Not on file  Stress: Not on file  Social Connections: Not on file  Intimate  Partner Violence: Not on file   Family History  Problem Relation Age of Onset   Leukemia Mother    Prostate cancer Father    Heart attack Brother    Colon cancer Neg Hx    Rectal cancer Neg Hx    Stomach cancer Neg Hx    Esophageal cancer Neg Hx    ROS: All systems reviewed and negative except as per HPI.   Current Outpatient Medications  Medication Sig Dispense Refill   acetaminophen (TYLENOL) 500 MG tablet Take 500 mg by mouth every 6 (six) hours as needed for moderate pain or headache.     aspirin EC 81 MG tablet Take 1 tablet (81 mg total) by mouth daily. 90 tablet 3   cetirizine (ZYRTEC) 10 MG tablet Take 10 mg by mouth daily as needed for allergies.     chlorhexidine (PERIDEX) 0.12 % solution 2 (two) times daily.     cholestyramine light (PREVALITE) 4 GM/DOSE powder Take 4 g by mouth daily. As needed     dapagliflozin propanediol (FARXIGA) 10 MG TABS tablet Take 1 tablet (10 mg total) by mouth daily before breakfast. 90 tablet 3   denosumab (XGEVA) 120 MG/1.7ML SOLN injection Inject 120 mg into the skin every 30 (thirty) days.     digoxin (LANOXIN) 0.125 MG tablet Take 0.5 tablets (0.0625 mg total) by mouth every other day. 15 tablet 0   enzalutamide (XTANDI) 40 MG tablet Take 160 mg by mouth daily.     Evolocumab (REPATHA SURECLICK) 038 MG/ML SOAJ INJECT 1 DOSE INTO THE SKIN EVERY 14 DAYS 6 mL 1   metoprolol succinate (TOPROL XL) 25 MG 24 hr tablet Take 0.5 tablets (12.5 mg total) by mouth daily. 45 tablet 3   omeprazole (PRILOSEC) 40 MG capsule Take 40 mg by mouth daily.     pregabalin (LYRICA) 100 MG capsule Take 100 mg by mouth 2 (two) times daily.     sacubitril-valsartan (ENTRESTO) 24-26 MG Take 1 tablet by mouth 2 (two) times daily. 180 tablet 3   spironolactone (ALDACTONE) 25 MG tablet Take 1 tablet (25 mg total) by mouth daily. 30 tablet 11   tamsulosin (FLOMAX) 0.4 MG CAPS capsule Take 0.4 mg by mouth daily.     torsemide (DEMADEX) 20 MG tablet Take 2 tablets (40 mg  total) by mouth 2 (two) times daily. 180 tablet 3   No current facility-administered medications for this encounter.   Wt Readings from Last 3 Encounters:  10/01/21 83 kg (183 lb)  09/07/21 82.9 kg (182 lb 12.8 oz)  08/29/21 81.6 kg (179 lb 12.8 oz)   BP 100/62    Pulse 68    Wt 83 kg (183 lb)    SpO2 95%    BMI 27.02 kg/m  General:  NAD. No resp difficulty HEENT: Normal Neck: Supple. No JVD. Carotids 2+ bilat; no bruits. No lymphadenopathy or thryomegaly appreciated. Cor: PMI nondisplaced. Regular rate & rhythm. No rubs, gallops or murmurs. Lungs:  Clear Abdomen: Soft, nontender, nondistended. No hepatosplenomegaly. No bruits or masses. Good bowel sounds. Extremities: No cyanosis, clubbing, rash, edema Neuro: Alert & oriented x 3, cranial nerves grossly intact. Moves all 4 extremities w/o difficulty. Affect pleasant.  Assessment/Plan: 1. CAD: Occluded mid LAD and 80% proximal stenosis in nondominant RCA in 4/22.  No intervention.  No chest pain.  - Continue ASA 81 mg daily.  - Continue Repatha (intolerant of statins and Zetia).  Check lipids next visit. 2. Chronic systolic CHF: Ischemic cardiomyopathy.  Echo in 4/22 with EF < 20%, moderate-severe LV dilation, normal RV, severe LAE, severe MR.  RHC in 4/22 with CI 1.8.  TEE in 6/22 with EF <20%, dyssynchrony, mildly decreased RV systolic function.  Echo in 7/22 with EF < 20%, moderate MR.  Medtronic CRT-D device placed.  Echo 1/23 showed EF < 20%, mild RV dysfunction.  No change to echo since CRT but he feels better symptomatically.  NYHA class I-II, he is not volume overloaded on exam.  No orthostatic symptoms since CRT implanted.  - Continue Toprol XL 12.5 mg daily.  - Continue Farxiga 10 mg daily.     - Continue spironolactone 25 mg daily.  - Continue digoxin 0.0625 mg every other day (lower dose with CKD stage III), check level today.   - Continue Entresto 24/26 bid. BMET today. No BP room to increase today. - Continue torsemide 40  mg bid. 3. Mitral regurgitation: Severe on 5/22 TTE. However, TEE in 6/22 showed moderate functional central MR.   Mild-moderate MR on echo 1/23 post-CRT.  4. Elevated LFTs: Viral hepatitis labs negative.  Abdominal US did not show cirrhosis but did show ascites.  Etiology uncertain, ?congestive hepatopathy.  - Repeat LFTs in 8/22 were normal.  5. CKD stage 3: Follow closely with medication titration.  6. Smoking: Active, encouraged him again to quit.   Followup in 2 months with Dr. Wynema Birch [facility] FNP 10/01/2021

## 2021-10-01 ENCOUNTER — Other Ambulatory Visit: Payer: Self-pay

## 2021-10-01 ENCOUNTER — Ambulatory Visit (HOSPITAL_COMMUNITY)
Admission: RE | Admit: 2021-10-01 | Discharge: 2021-10-01 | Disposition: A | Discharge status: Home / Self Care | Payer: Medicare Other | Source: Ambulatory Visit | Attending: Family Medicine | Admitting: Family Medicine

## 2021-10-01 ENCOUNTER — Encounter (HOSPITAL_COMMUNITY): Payer: Self-pay

## 2021-10-01 VITALS — BP 100/62 | HR 68 | Wt 183.0 lb

## 2021-10-01 DIAGNOSIS — C7951 Secondary malignant neoplasm of bone: Secondary | ICD-10-CM | POA: Insufficient documentation

## 2021-10-01 DIAGNOSIS — F17210 Nicotine dependence, cigarettes, uncomplicated: Secondary | ICD-10-CM | POA: Insufficient documentation

## 2021-10-01 DIAGNOSIS — Z7982 Long term (current) use of aspirin: Secondary | ICD-10-CM | POA: Insufficient documentation

## 2021-10-01 DIAGNOSIS — I340 Nonrheumatic mitral (valve) insufficiency: Secondary | ICD-10-CM | POA: Diagnosis not present

## 2021-10-01 DIAGNOSIS — Z95810 Presence of automatic (implantable) cardiac defibrillator: Secondary | ICD-10-CM | POA: Diagnosis not present

## 2021-10-01 DIAGNOSIS — I2510 Atherosclerotic heart disease of native coronary artery without angina pectoris: Secondary | ICD-10-CM | POA: Diagnosis not present

## 2021-10-01 DIAGNOSIS — Z79899 Other long term (current) drug therapy: Secondary | ICD-10-CM | POA: Diagnosis not present

## 2021-10-01 DIAGNOSIS — C61 Malignant neoplasm of prostate: Secondary | ICD-10-CM | POA: Insufficient documentation

## 2021-10-01 DIAGNOSIS — R188 Other ascites: Secondary | ICD-10-CM | POA: Insufficient documentation

## 2021-10-01 DIAGNOSIS — Z923 Personal history of irradiation: Secondary | ICD-10-CM | POA: Diagnosis not present

## 2021-10-01 DIAGNOSIS — N183 Chronic kidney disease, stage 3 unspecified: Secondary | ICD-10-CM | POA: Diagnosis not present

## 2021-10-01 DIAGNOSIS — I130 Hypertensive heart and chronic kidney disease with heart failure and stage 1 through stage 4 chronic kidney disease, or unspecified chronic kidney disease: Secondary | ICD-10-CM | POA: Insufficient documentation

## 2021-10-01 DIAGNOSIS — I5022 Chronic systolic (congestive) heart failure: Secondary | ICD-10-CM

## 2021-10-01 DIAGNOSIS — Z7984 Long term (current) use of oral hypoglycemic drugs: Secondary | ICD-10-CM | POA: Insufficient documentation

## 2021-10-01 DIAGNOSIS — Z720 Tobacco use: Secondary | ICD-10-CM

## 2021-10-01 DIAGNOSIS — R7989 Other specified abnormal findings of blood chemistry: Secondary | ICD-10-CM

## 2021-10-01 DIAGNOSIS — I255 Ischemic cardiomyopathy: Secondary | ICD-10-CM | POA: Insufficient documentation

## 2021-10-01 LAB — DIGOXIN LEVEL: Digoxin Level: 0.5 ng/mL — ABNORMAL LOW (ref 0.8–2.0)

## 2021-10-01 LAB — BASIC METABOLIC PANEL
Anion gap: 11 (ref 5–15)
BUN: 51 mg/dL — ABNORMAL HIGH (ref 8–23)
CO2: 26 mmol/L (ref 22–32)
Calcium: 9.3 mg/dL (ref 8.9–10.3)
Chloride: 104 mmol/L (ref 98–111)
Creatinine, Ser: 2.33 mg/dL — ABNORMAL HIGH (ref 0.61–1.24)
GFR, Estimated: 28 mL/min — ABNORMAL LOW (ref >60)
Glucose, Bld: 117 mg/dL — ABNORMAL HIGH (ref 70–99)
Potassium: 4.2 mmol/L (ref 3.5–5.1)
Sodium: 141 mmol/L (ref 135–145)

## 2021-10-01 NOTE — Patient Instructions (Addendum)
Thank you for coming in today  Labs (BMET, Dig) were done today, if any labs are abnormal the clinic will call you  Your physician recommends that you schedule a follow-up appointment in: 2 months with Dr. Aundra Dubin  Lipid Clinic [phone]  At the [facility], you and your health needs are our priority. As part of our continuing mission to provide you with exceptional heart care, we have created designated Provider Care Teams. These Care Teams include your primary Cardiologist (physician) and Advanced Practice Providers (APPs- Physician Assistants and Nurse Practitioners) who all work together to provide you with the care you need, when you need it.   You may see any of the following providers on your designated Care Team at your next follow up: Dr Glori Bickers Dr Haynes Kerns, NP Lyda Jester, [facility] [place], [place] Audry Riles, PharmD   Please be sure to bring in all your medications bottles to every appointment.   If you have any questions or concerns before your next appointment please send [place] a message through [place] or call our office at [phone].    TO LEAVE A MESSAGE FOR THE NURSE SELECT OPTION 2, PLEASE LEAVE A MESSAGE INCLUDING: YOUR NAME DATE OF BIRTH CALL BACK NUMBER REASON FOR CALL**this is important as we prioritize the call backs  YOU WILL RECEIVE A CALL BACK THE SAME DAY AS LONG AS YOU CALL BEFORE 4:00 PM

## 2021-10-12 ENCOUNTER — Telehealth: Payer: Self-pay

## 2021-10-12 NOTE — Telephone Encounter (Signed)
PA started on CMM for Farxiga 10 mg. Key [id number]

## 2021-10-17 ENCOUNTER — Other Ambulatory Visit: Payer: Self-pay | Admitting: Internal Medicine

## 2021-10-17 DIAGNOSIS — I2510 Atherosclerotic heart disease of native coronary artery without angina pectoris: Secondary | ICD-10-CM

## 2021-10-19 ENCOUNTER — Telehealth: Payer: Self-pay | Admitting: Internal Medicine

## 2021-10-19 NOTE — Telephone Encounter (Signed)
Patient's wife called stating he is going tomorrow to the hospital to have his labs done. Lipid panel needs to be released.  ?

## 2021-10-19 NOTE — Telephone Encounter (Signed)
Spoke with [facility] here in our office she stated that the hosp should be able to see our order for pt to go to the hosp tomorrow. ?Pt notified to go to the hosp and make sure pt is fasting. Verbalized understanding ?

## 2021-10-20 ENCOUNTER — Other Ambulatory Visit (HOSPITAL_COMMUNITY)
Admission: AD | Admit: 2021-10-20 | Discharge: 2021-10-20 | Disposition: A | Discharge status: Home / Self Care | Payer: Medicare Other | Source: Ambulatory Visit | Attending: Internal Medicine | Admitting: Internal Medicine

## 2021-10-20 ENCOUNTER — Emergency Department (HOSPITAL_COMMUNITY)
Admission: EM | Admit: 2021-10-20 | Discharge: 2021-10-20 | Discharge status: Absent without leave | Payer: Medicare Other | Source: Home / Self Care

## 2021-10-20 DIAGNOSIS — I2510 Atherosclerotic heart disease of native coronary artery without angina pectoris: Secondary | ICD-10-CM | POA: Insufficient documentation

## 2021-10-20 DIAGNOSIS — E259 Adrenogenital disorder, unspecified: Secondary | ICD-10-CM | POA: Insufficient documentation

## 2021-10-20 LAB — LIPID PANEL
Cholesterol: 308 mg/dL — ABNORMAL HIGH (ref 0–200)
HDL: 44 mg/dL (ref >40)
LDL Cholesterol: 205 mg/dL — ABNORMAL HIGH (ref 0–99)
Total CHOL/HDL Ratio: 7 RATIO
Triglycerides: 297 mg/dL — ABNORMAL HIGH (ref <150)
VLDL: 59 mg/dL — ABNORMAL HIGH (ref 0–40)

## 2021-10-22 ENCOUNTER — Ambulatory Visit (INDEPENDENT_AMBULATORY_CARE_PROVIDER_SITE_OTHER): Payer: Medicare Other | Admitting: Internal Medicine

## 2021-10-22 ENCOUNTER — Encounter: Payer: Self-pay | Admitting: Internal Medicine

## 2021-10-22 ENCOUNTER — Telehealth: Payer: Self-pay | Admitting: Internal Medicine

## 2021-10-22 ENCOUNTER — Other Ambulatory Visit: Payer: Self-pay

## 2021-10-22 VITALS — BP 119/64 | HR 70 | Ht 69.0 in | Wt 182.8 lb

## 2021-10-22 DIAGNOSIS — I2510 Atherosclerotic heart disease of native coronary artery without angina pectoris: Secondary | ICD-10-CM

## 2021-10-22 DIAGNOSIS — I420 Dilated cardiomyopathy: Secondary | ICD-10-CM | POA: Diagnosis not present

## 2021-10-22 DIAGNOSIS — T466X5D Adverse effect of antihyperlipidemic and antiarteriosclerotic drugs, subsequent encounter: Secondary | ICD-10-CM | POA: Diagnosis not present

## 2021-10-22 DIAGNOSIS — T466X5A Adverse effect of antihyperlipidemic and antiarteriosclerotic drugs, initial encounter: Secondary | ICD-10-CM

## 2021-10-22 DIAGNOSIS — E7801 Familial hypercholesterolemia: Secondary | ICD-10-CM

## 2021-10-22 DIAGNOSIS — M791 Myalgia, unspecified site: Secondary | ICD-10-CM

## 2021-10-22 MED ORDER — NEXLETOL 180 MG PO TABS: 1.0000 | ORAL_TABLET | Freq: Every day | ORAL | 3 refills | Status: DC

## 2021-10-22 NOTE — Patient Instructions (Signed)
Medication Instructions:  ?START Nexletol '180mg'$  in addition to current therapy  ? ?The [employer] foundation offers assistance to help pay for medication copays.  They will cover copays for all cholesterol lowering meds, including statins, fibrates, omega-3 fish oils like Vascepa, ezetimibe, Repatha, Praluent, Nexletol, Nexlizet.  The cards are usually good for $2,500 or 12 months, whichever comes first. ?Go to healthwellfoundation.org ?Click on ?Apply Now? ?Answer questions as to whom is applying (patient or representative) ?Your disease fund will be ?hypercholesterolemia - Medicare access? ?They will ask questions about finances and which medications you are taking for cholesterol ?When you submit, the approval is usually within minutes.  You will need to print the card information from the site ?You will need to show this information to your pharmacy, they will bill your Medicare Part D plan first -then bill [employer] --for the copay.   ?You can also call them at [phone], although the hold times can be quite long.  ? ? ? ? ?*If you need a refill on your cardiac medications before your next appointment, please call your pharmacy* ? ? ?Lab Work: ?FASTING lab work to check cholesterol in about 3-4 months ? ?If you have labs (blood work) drawn today and your tests are completely normal, you will receive your results only by: ?MyChart Message (if you have MyChart) OR ?A paper copy in the mail ?If you have any lab test that is abnormal or we need to change your treatment, we will call you to review the results. ? ? ?Follow-Up: ?At [facility], you and your health needs are our priority.  As part of our continuing mission to provide you with exceptional heart care, we have created designated Provider Care Teams.  These Care Teams include your primary Cardiologist (physician) and Advanced Practice Providers (APPs -  Physician Assistants and Nurse Practitioners) who all work together to provide you with the  care you need, when you need it. ? ?We recommend signing up for the patient portal called "MyChart".  Sign up information is provided on this After Visit Summary.  MyChart is used to connect with patients for Virtual Visits (Telemedicine).  Patients are able to view lab/test results, encounter notes, upcoming appointments, etc.  Non-urgent messages can be sent to your provider as well.   ?To learn more about what you can do with MyChart, go to [email or web address].   ? ?Your next appointment:   ?3-4 months with Dr. Debara Pickett -- lipid clinic ? ?

## 2021-10-22 NOTE — Progress Notes (Signed)
LIPID CLINIC CONSULT NOTE  Chief Complaint:  Follow-up dyslipidemia  Primary Care Physician: Tommy Grammes, MD  Primary Cardiologist:  Tommy Casino, MD  HPI:  Tommy Ward is a 81 y.o. male who is being seen today for the evaluation of dyslipidemia at the request of Tommy Grammes, MD.  This is a pleasant 81 year old male patient of Dr. 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  Tommy Ward has a [name] however he says most likely inherited is coronary artery disease and dyslipidemia from his mom and sister, both had high cholesterol though his mother died at age 73.  He was originally from [place] and was living in [place] prior to coming to [place].  He saw a cardiologist there named Dr. Erling Ward in [place].  At one point he had stress testing which indicated a fixed defect suggestive of scar.  His LVEF was reduced between 40 to 45%.  Subsequently here he is also had repeat stress testing and echo which are confirmed evidence of prior heart attack.  This being said, he has never had a prior heart catheterization.  EKG today shows sinus rhythm with marked sinus arrhythmia and left bundle branch block, performed due to his coronary history which was not well elucidated in the chart.  He was also noted to be irregularly irregular on exam.  Most recently his lipid showed total cholesterol 400, triglycerides 295, HDL 51 and LDL 290.  In addition to his marked dyslipidemia, he reports significant statin intolerance to multiple different statins.  12/30/2019  Tommy Ward returns today for follow-up.  Overall he is doing well on Repatha.  He has had marked reduction in his lipids.  Most recently his lipids showed total cholesterol 255, triglycerides 398, HDL 44 and LDL 131.  This is down from 290.  Total cholesterol was over 400.  He denies any side effects with the medication.  I would like to see if we could drive his  cholesterol a little lower.  We talked about different options including possibly adding ezetimibe or Vascepa.  Cost may be an issue as the PCSK9 inhibitor is expensive.  07/28/2020  Tommy Ward is seen today in follow-up.  Unfortunately he was unable to tolerate ezetimibe which caused similar side effects as the statins.  Despite this though he has made some dietary changes and is more active.  His lipids are actually even better now with total of 193, triglycerides still elevated 261, HDL 52 and LDL 89.  This is on monotherapy with Repatha.  10/22/2021  Tommy Ward returns today for follow-up.  He just had repeat labs drawn.  He reports compliance with Repatha.  He saw Tommy Ward in January.  Updated labs show total cholesterol 308, triglycerides 297, HDL 44 and LDL 205.  His LDL 3 months ago was 159 and 9 months prior to that was down to 38, which showed excellent control.  His LDL has been as high as 290 in the past.  I suspect there must be a significant dietary component to this.  Although he reports compliance with the medication and its very hard to believe his LDL is quite that high with it being significantly lower.  That being said he remains well above target.  Cannot tolerate statins or ezetimibe.  PMHx:  Past Medical History:  Diagnosis Date   Abnormal stress test 12/05/2020   AKI (acute kidney injury) ([place]) 11/25/2019   Anemia due to stage  3b chronic kidney disease ([place]) 08/24/2019   Aortic valve disorder 07/08/2014   B12 deficiency 03/22/2018   Benign hypertension with chronic kidney disease, stage III ([place]) 05/20/2019   CAD (coronary artery disease) 11/23/2020   Cancer ([place]) 08/2008   prostate ca   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   Cataract 11/2013   bilateral   Chronic renal insufficiency, stage III (moderate) (HCC) 80/10/4915   Chronic systolic heart failure (HCC)    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 02/24/2019   Coronary arteriosclerosis 07/06/2012   Decreased  cardiac ejection fraction 05/20/2019   Depressed left ventricular ejection fraction 12/05/2020   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   Essential hypertension 07/06/2012   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   Gastroesophageal reflux disease without esophagitis 03/22/2018   GERD (gastroesophageal reflux disease)    HFrEF (heart failure with reduced ejection fraction) ([place]) 11/23/2020   Hypercalcemia of malignancy 02/16/2021   Hyperkalemia 08/24/2019   Hyperlipidemia    Hyperphosphatemia 11/25/2019   Hypertension    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   Left bundle branch block 07/06/2012   Malignant neoplasm of prostate ([place])    Malignant neoplasm of prostate ([place])    Metabolic bone disease 91/50/5697   Mixed hyperlipidemia 07/06/2012   Nonrheumatic mitral valve regurgitation 12/05/2020   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   Olecranon bursitis of left elbow 06/25/2018   Pre-diabetes 12/05/2020   Pulmonary hypertension ([place]) 12/05/2020   S/P radiation therapy > 12 wks ago 2010   prostate CA   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[place])    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[name]) 12/05/2020   Tobacco use 12/05/2020   Vitamin D deficiency 08/24/2019    Past Surgical History:  Procedure Laterality Date   BIV ICD INSERTION CRT-D N/A 05/07/2021   Procedure: BIV ICD INSERTION CRT-D;  Surgeon: Vickie Epley, MD;  Location: [facility];  Service: Cardiovascular;  Laterality: N/A;   CHOLECYSTECTOMY  01/2003   lap chole   left hip repaired     RIGHT/LEFT HEART CATH AND CORONARY ANGIOGRAPHY N/A 12/08/2020   Procedure: RIGHT/LEFT HEART CATH AND CORONARY ANGIOGRAPHY;  Surgeon: Wellington Hampshire, MD;  Location: [facility];  Service: Cardiovascular;  Laterality: N/A;   TEE WITHOUT CARDIOVERSION N/A 01/26/2021   Procedure: TRANSESOPHAGEAL ECHOCARDIOGRAM (TEE);  Surgeon: Larey Dresser, MD;  Location: [facility]  ENDOSCOPY;  Service: Cardiovascular;  Laterality: N/A;    FAMHx:  Family History  Problem Relation Age of Onset   Leukemia Mother    Prostate cancer Father    Heart attack Brother    Colon cancer Neg Hx    Rectal cancer Neg Hx    Stomach cancer Neg Hx    Esophageal cancer Neg Hx     SOCHx:   reports that he has been smoking cigarettes. He has a 25.00 pack-year smoking history. He has never used smokeless tobacco. He reports that he does not currently use alcohol. He reports that he does not use drugs.  ALLERGIES:  Allergies  Allergen Reactions   Ezetimibe Other (See Comments)    Myalgia   Gabapentin Itching and Other (See Comments)    Sore throat, breakouts   Statins Other (See Comments)    Myalgias (intolerance)    ROS: Pertinent items noted in HPI and remainder of comprehensive ROS otherwise negative.  HOME MEDS: Current Outpatient Medications on File Prior to Visit  Medication Sig Dispense Refill   acetaminophen (TYLENOL) 500 MG tablet Take 500 mg  by mouth every 6 (six) hours as needed for moderate pain or headache.     aspirin EC 81 MG tablet Take 1 tablet (81 mg total) by mouth daily. 90 tablet 3   cetirizine (ZYRTEC) 10 MG tablet Take 10 mg by mouth daily as needed for allergies.     chlorhexidine (PERIDEX) 0.12 % solution 2 (two) times daily.     cholestyramine light (PREVALITE) 4 GM/DOSE powder Take 4 g by mouth daily. As needed     dapagliflozin propanediol (FARXIGA) 10 MG TABS tablet Take 1 tablet (10 mg total) by mouth daily before breakfast. 90 tablet 3   digoxin (LANOXIN) 0.125 MG tablet Take 0.5 tablets (0.0625 mg total) by mouth every other day. 15 tablet 0   enzalutamide (XTANDI) 40 MG tablet Take 160 mg by mouth daily.     Evolocumab (REPATHA SURECLICK) 643 MG/ML SOAJ INJECT 1 DOSE INTO THE SKIN EVERY 14 DAYS 6 mL 1   metoprolol succinate (TOPROL XL) 25 MG 24 hr tablet Take 0.5 tablets (12.5 mg total) by mouth daily. 45 tablet 3   omeprazole (PRILOSEC) 40  MG capsule Take 40 mg by mouth daily.     pregabalin (LYRICA) 100 MG capsule Take 100 mg by mouth 2 (two) times daily.     sacubitril-valsartan (ENTRESTO) 24-26 MG Take 1 tablet by mouth 2 (two) times daily. 180 tablet 3   spironolactone (ALDACTONE) 25 MG tablet Take 1 tablet (25 mg total) by mouth daily. 30 tablet 11   tamsulosin (FLOMAX) 0.4 MG CAPS capsule Take 0.4 mg by mouth daily.     torsemide (DEMADEX) 20 MG tablet Take 2 tablets (40 mg total) by mouth 2 (two) times daily. 180 tablet 3   denosumab (XGEVA) 120 MG/1.7ML SOLN injection Inject 120 mg into the skin every 30 (thirty) days.     No current facility-administered medications on file prior to visit.    LABS/IMAGING: Results for orders placed or performed during the hospital encounter of 10/20/21 (from the past 48 hour(s))  Lipid panel     Status: Abnormal   Collection Time: 10/20/21  9:36 AM  Result Value Ref Range   Cholesterol 308 (H) 0 - 200 mg/dL   Triglycerides 297 (H) <150 mg/dL   HDL 44 >40 mg/dL   Total CHOL/HDL Ratio 7.0 RATIO   VLDL 59 (H) 0 - 40 mg/dL   LDL Cholesterol 205 (H) 0 - 99 mg/dL    Comment:        Total Cholesterol/HDL:CHD Risk Coronary Heart Disease Risk Table                     Men   Women  1/2 Average Risk   3.4   3.3  Average Risk       5.0   4.4  2 X Average Risk   9.6   7.1  3 X Average Risk  23.4   11.0        Use the calculated Patient Ratio above and the CHD Risk Table to determine the patient's CHD Risk.        ATP III CLASSIFICATION (LDL):  <100     mg/dL   Optimal  100-129  mg/dL   Near or Above                    Optimal  130-159  mg/dL   Borderline  160-189  mg/dL   High  >190     mg/dL  Very High Performed at [facility], [place] [address] [place], [place] [address]    No results found.  LIPID PANEL:    Component Value Date/Time   CHOL 308 (H) 10/20/2021 0936   CHOL 400 (H) 02/25/2019 0905   TRIG 297 (H) 10/20/2021 0936   HDL 44 10/20/2021 0936   HDL 51  02/25/2019 0905   CHOLHDL 7.0 10/20/2021 0936   VLDL 59 (H) 10/20/2021 0936   LDLCALC 205 (H) 10/20/2021 0936   LDLCALC 290 (H) 02/25/2019 0905    WEIGHTS: Wt Readings from Last 3 Encounters:  10/22/21 182 lb 12.8 oz (82.9 kg)  10/01/21 183 lb (83 kg)  09/07/21 182 lb 12.8 oz (82.9 kg)    VITALS: BP 119/64    Pulse 70    Ht '5\' 9"'$  (1.753 m)    Wt 182 lb 12.8 oz (82.9 kg)    SpO2 96%    BMI 26.99 kg/m   EXAM: Deferred  EKG: Deferred  ASSESSMENT: 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   Tommy Ward has had progressive increase in his cholesterol which was an LDL 38 9 months ago now up to 205.  He says he has been taking Repatha which is difficult to understand why he has had such a significant increase in his LDL.  We discussed dietary changes.  He has had some weight gain and dietary discretions but not significant.  He will likely need additional therapy and would recommend adding Nexletol which does now demonstrate cardiovascular risk reduction based on the clear-outcomes trial data presented a few days ago.  Plan repeat lipids in 3 to 4 months and follow-up at that time.  Tommy Casino, MD, [facility], [place] Director of the Advanced Lipid Disorders &  Cardiovascular Risk Reduction Clinic Diplomate of the American Board of Clinical Lipidology Attending Cardiologist  Direct Dial: [phone]   Fax: [phone]  Website:  [email or web address] [email or web address] Darrington 10/22/2021, 8:45 AM

## 2021-10-22 NOTE — Telephone Encounter (Signed)
PA for nexletol submitted via CMM ?Key: [id number] - Rx #: [id number] ?

## 2021-10-23 ENCOUNTER — Telehealth: Payer: Self-pay | Admitting: Internal Medicine

## 2021-10-23 NOTE — Telephone Encounter (Signed)
Approvedon March 6 ?Effective from 10/22/2021 through 10/23/2022. ?

## 2021-10-23 NOTE — Telephone Encounter (Signed)
Patient is approved for [employer]well foundation from for hypercholesterolemia-medicare access for $2500 from 09/22/21 - 09/21/22 ? ?Healthwell ID: [id number] ? ?Pharmacy card ID: [id number] ?PC Group: [id number] ?PC BIN: [id number] ?PC PCN: [id number] ?PC Processor: [id number] ?

## 2021-10-29 ENCOUNTER — Other Ambulatory Visit: Payer: Self-pay

## 2021-10-30 ENCOUNTER — Ambulatory Visit (INDEPENDENT_AMBULATORY_CARE_PROVIDER_SITE_OTHER): Payer: Medicare Other | Admitting: Cardiology

## 2021-10-30 ENCOUNTER — Encounter: Payer: Self-pay | Admitting: Cardiology

## 2021-10-30 ENCOUNTER — Other Ambulatory Visit: Payer: Self-pay

## 2021-10-30 VITALS — BP 114/64 | HR 64 | Ht 69.0 in | Wt 182.2 lb

## 2021-10-30 DIAGNOSIS — I10 Essential (primary) hypertension: Secondary | ICD-10-CM | POA: Diagnosis not present

## 2021-10-30 DIAGNOSIS — E782 Mixed hyperlipidemia: Secondary | ICD-10-CM

## 2021-10-30 DIAGNOSIS — I2510 Atherosclerotic heart disease of native coronary artery without angina pectoris: Secondary | ICD-10-CM

## 2021-10-30 DIAGNOSIS — E7849 Other hyperlipidemia: Secondary | ICD-10-CM | POA: Diagnosis not present

## 2021-10-30 DIAGNOSIS — I119 Hypertensive heart disease without heart failure: Secondary | ICD-10-CM

## 2021-10-30 NOTE — Progress Notes (Signed)
?Cardiology Office Note:   ? ?Date:  10/30/2021  ? ?ID:  Tommy Ward, DOB [birth year], MRN [id number] ? ?PCP:  Serita Grammes, MD  ?Cardiologist:  Jenean Lindau, MD  ? ?Referring MD: Serita Grammes, MD  ? ? ?ASSESSMENT:   ? ?1. Mixed hyperlipidemia   ?2. Hypertensive heart disease without congestive heart failure   ?3. Primary hypertension   ?4. Familial hyperlipidemia   ?5. Coronary arteriosclerosis   ? ?PLAN:   ? ?In order of problems listed above: ? ?Coronary artery disease: Secondary prevention stressed with the patient.  Importance of compliance with diet medication stressed any vocalized understanding. ?Advanced cardiomyopathy with severely depressed ejection fraction.  Recent echocardiogram has revealed ejection fraction of less than 20%.  Patient has a defibrillator and also CRT device and managed by her congestive heart failure colleagues.  CHF education given diet emphasized salt intake issues discussed and he was advised to check his weight on a regular basis and he understands ?Renal insufficiency: This complicates his above-mentioned management. ?Essential hypertension: Blood pressure stable and diet was emphasized. ?Mixed dyslipidemia: Managed by lipid clinic.  He now is on Nexletol and they are following him for this. ?Patient will be seen in follow-up appointment in 6 months or earlier if the patient has any concerns ? ? ? ?Medication Adjustments/Labs and Tests Ordered: ?Current medicines are reviewed at length with the patient today.  Concerns regarding medicines are outlined above.  ?No orders of the defined types were placed in this encounter. ? ?No orders of the defined types were placed in this encounter. ? ? ? ?No chief complaint on file. ?  ? ?History of Present Illness:   ? ?Tommy Ward is a 81 y.o. male.  Patient has past medical history of coronary artery disease, severely depressed left ventricular systolic function, essential hypertension, dyslipidemia and advanced  cardiomyopathy.  He has undergone defibrillator placement and also CRT therapy.  He follows with her lipids and congestive heart failure specialist.  He has significant renal insufficiency.  At the time of my evaluation, the patient is alert awake oriented and in no distress. ? ?Past Medical History:  ?Diagnosis Date  ? Abnormal stress test 12/05/2020  ? AKI (acute kidney injury) ([place]) 11/25/2019  ? 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[place]) 08/24/2019  ? Aortic valve disorder 07/08/2014  ? B12 deficiency 03/22/2018  ? Benign hypertension with chronic kidney disease, stage III ([place]) 05/20/2019  ? CAD (coronary artery disease) 11/23/2020  ? Cancer ([place]) 08/2008  ? prostate ca  ? 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  ? Cataract 11/2013  ? bilateral  ? Chronic renal insufficiency, stage III (moderate) ([place]) 11/27/2015  ? Chronic systolic heart failure ([place])   ? 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 02/24/2019  ? Coronary arteriosclerosis 07/06/2012  ? Decreased cardiac ejection fraction 05/20/2019  ? Depressed left ventricular ejection fraction 12/05/2020  ? 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 Essential hypertension 07/06/2012  ? 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  ? Gastroesophageal reflux disease without esophagitis 03/22/2018  ? GERD (gastroesophageal reflux disease)   ? HFrEF (heart failure with reduced ejection fraction) ([place]) 11/23/2020  ? Hypercalcemia of malignancy 02/16/2021  ? Hyperkalemia 08/24/2019  ? Hyperlipidemia   ? Hyperphosphatemia 11/25/2019  ? Hypertension   ? 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  ? Left bundle branch block 07/06/2012  ? Malignant neoplasm of prostate ([place])   ? Malignant neoplasm of prostate ([place])   ? Metabolic bone disease 65/53/7482  ?  Mixed hyperlipidemia 07/06/2012  ? Nonrheumatic mitral valve regurgitation 12/05/2020  ? 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  ? Olecranon bursitis of left elbow 06/25/2018  ?  Pre-diabetes 12/05/2020  ? Pulmonary hypertension ([place]) 12/05/2020  ? S/P radiation therapy > 12 wks ago 2010  ? prostate CA  ? 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[place]) 12/05/2020  ? Tobacco use 12/05/2020  ? Vitamin D deficiency 08/24/2019  ? ? ?Past Surgical History:  ?Procedure Laterality Date  ? BIV ICD INSERTION CRT-D N/A 05/07/2021  ? Procedure: BIV ICD INSERTION CRT-D;  Surgeon: Vickie Epley, MD;  Location: [facility];  Service: Cardiovascular;  Laterality: N/A;  ? CHOLECYSTECTOMY  01/2003  ? lap chole  ? left hip repaired    ? RIGHT/LEFT HEART CATH AND CORONARY ANGIOGRAPHY N/A 12/08/2020  ? Procedure: RIGHT/LEFT HEART CATH AND CORONARY ANGIOGRAPHY;  Surgeon: Wellington Hampshire, MD;  Location: [facility];  Service: Cardiovascular;  Laterality: N/A;  ? TEE WITHOUT CARDIOVERSION N/A 01/26/2021  ? Procedure: TRANSESOPHAGEAL ECHOCARDIOGRAM (TEE);  Surgeon: Larey Dresser, MD;  [facility];  Service: Cardiovascular;  Laterality: N/A;  ? ? ?Current Medications: ?Current Meds  ?Medication Sig  ? acetaminophen (TYLENOL) 500 MG tablet Take 500 mg by mouth every 6 (six) hours as needed for moderate pain or headache.  ? aspirin EC 81 MG tablet Take 1 tablet (81 mg total) by mouth daily.  ? Bempedoic Acid (NEXLETOL) 180 MG TABS Take 1 tablet by mouth daily.  ? cetirizine (ZYRTEC) 10 MG tablet Take 10 mg by mouth daily as needed for allergies.  ? chlorhexidine (PERIDEX) 0.12 % solution 1 mL by Mouth Rinse route 2 (two) times daily.  ? cholestyramine light (PREVALITE) 4 GM/DOSE powder Take 4 g by mouth daily.  ? dapagliflozin propanediol (FARXIGA) 10 MG TABS tablet Take 1 tablet (10 mg total) by mouth daily before breakfast.  ? digoxin (LANOXIN) 0.125 MG tablet Take 0.5 tablets (0.0625 mg total) by mouth every other day.  ? enzalutamide (XTANDI) 40 MG tablet Take 160 mg by mouth daily.  ? Evolocumab (REPATHA SURECLICK) 175 MG/ML SOAJ  INJECT 1 DOSE INTO THE SKIN EVERY 14 DAYS  ? metoprolol succinate (TOPROL XL) 25 MG 24 hr tablet Take 0.5 tablets (12.5 mg total) by mouth daily.  ? omeprazole (PRILOSEC) 40 MG capsule Take 40 mg by mouth daily.  ? pregabalin (LYRICA) 100 MG capsule Take 100 mg by mouth 2 (two) times daily.  ? sacubitril-valsartan (ENTRESTO) 24-26 MG Take 1 tablet by mouth 2 (two) times daily.  ? spironolactone (ALDACTONE) 25 MG tablet Take 1 tablet (25 mg total) by mouth daily.  ? tamsulosin (FLOMAX) 0.4 MG CAPS capsule Take 0.4 mg by mouth daily.  ? torsemide (DEMADEX) 20 MG tablet Take 2 tablets (40 mg total) by mouth 2 (two) times daily.  ?  ? ?Allergies:   Ezetimibe, Gabapentin, and Statins  ? ?Social History  ? ?Socioeconomic History  ? Marital status: Married  ?  Spouse name: Not on file  ? Number of children: Not on file  ? Years of education: Not on file  ? Highest education level: Not on file  ?Occupational History  ? Not on file  ?Tobacco Use  ? Smoking status: Every Day  ?  Packs/day: 0.50  ?  Years: 50.00  ?  Pack years: 25.00  ?  Types: Cigarettes  ? Smokeless tobacco: Never  ?Vaping Use  ?  Vaping Use: Never used  ?Substance and Sexual Activity  ? Alcohol use: Not Currently  ? Drug use: Never  ? Sexual activity: Not on file  ?Other Topics Concern  ? Not on file  ?Social History Narrative  ? Not on file  ? ?Social Determinants of Health  ? ?Financial Resource Strain: Not on file  ?Food Insecurity: Not on file  ?Transportation Needs: Not on file  ?Physical Activity: Not on file  ?Stress: Not on file  ?Social Connections: Not on file  ?  ? ?Family History: ?The patient's family history includes Heart attack in his brother; Leukemia in his mother; Prostate cancer in his father. There is no history of Colon cancer, Rectal cancer, Stomach cancer, or Esophageal cancer. ? ?ROS:   ?Please see the history of present illness.    ?All other systems reviewed and are negative. ? ?EKGs/Labs/Other Studies Reviewed:   ? ?The following  studies were reviewed today: ?IMPRESSIONS  ? ? ? 1. Left ventricular ejection fraction, by estimation, is <20%. The left  ?ventricle has severely decreased function. The left ventricle demonstrates  ?global hypo

## 2021-10-30 NOTE — Patient Instructions (Signed)

## 2021-11-05 ENCOUNTER — Ambulatory Visit (INDEPENDENT_AMBULATORY_CARE_PROVIDER_SITE_OTHER): Payer: Medicare Other

## 2021-11-05 DIAGNOSIS — I420 Dilated cardiomyopathy: Secondary | ICD-10-CM | POA: Diagnosis not present

## 2021-11-06 LAB — CUP PACEART REMOTE DEVICE CHECK
Battery Remaining Longevity: 78 mo
Battery Remaining Percentage: 90 %
Battery Voltage: 2.98 V
Brady Statistic AP VP Percent: 61 %
Brady Statistic AP VS Percent: 1.7 %
Brady Statistic AS VP Percent: 30 %
Brady Statistic AS VS Percent: 2.6 %
Brady Statistic RA Percent Paced: 57 %
Date Time Interrogation Session: 20230320052712
HighPow Impedance: 77 Ohm
Implantable Lead Implant Date: 20220919
Implantable Lead Implant Date: 20220919
Implantable Lead Implant Date: 20220919
Implantable Lead Location: 753858
Implantable Lead Location: 753859
Implantable Lead Location: 753860
Implantable Pulse Generator Implant Date: 20220919
Lead Channel Impedance Value: 460 Ohm
Lead Channel Impedance Value: 530 Ohm
Lead Channel Impedance Value: 790 Ohm
Lead Channel Pacing Threshold Amplitude: 0.75 V
Lead Channel Pacing Threshold Amplitude: 0.75 V
Lead Channel Pacing Threshold Amplitude: 1.25 V
Lead Channel Pacing Threshold Pulse Width: 0.5 ms
Lead Channel Pacing Threshold Pulse Width: 0.5 ms
Lead Channel Pacing Threshold Pulse Width: 0.7 ms
Lead Channel Sensing Intrinsic Amplitude: 1.3 mV
Lead Channel Sensing Intrinsic Amplitude: 12 mV
Lead Channel Setting Pacing Amplitude: 2 V
Lead Channel Setting Pacing Amplitude: 2 V
Lead Channel Setting Pacing Amplitude: 2.5 V
Lead Channel Setting Pacing Pulse Width: 0.5 ms
Lead Channel Setting Pacing Pulse Width: 0.7 ms
Lead Channel Setting Sensing Sensitivity: 0.5 mV
Pulse Gen Serial Number: 111046561

## 2021-11-09 ENCOUNTER — Encounter: Payer: Self-pay | Admitting: Sports Medicine

## 2021-11-09 ENCOUNTER — Ambulatory Visit (INDEPENDENT_AMBULATORY_CARE_PROVIDER_SITE_OTHER): Payer: Medicare Other | Admitting: Sports Medicine

## 2021-11-09 ENCOUNTER — Other Ambulatory Visit: Payer: Self-pay

## 2021-11-09 DIAGNOSIS — M79674 Pain in right toe(s): Secondary | ICD-10-CM

## 2021-11-09 DIAGNOSIS — B351 Tinea unguium: Secondary | ICD-10-CM

## 2021-11-09 DIAGNOSIS — M79675 Pain in left toe(s): Secondary | ICD-10-CM

## 2021-11-09 DIAGNOSIS — G629 Polyneuropathy, unspecified: Secondary | ICD-10-CM

## 2021-11-09 NOTE — Progress Notes (Signed)
Subjective: ?Tommy Ward is a 81 y.o. male patient seen today in office with complaint of mildly painful thickened and elongated toenails; unable to trim.  Denies any changes with medical history since last encounter.  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 ?Patient is assisted by wife this visit. ? ?PCP visit 2 months ago. ? ?Patient Active Problem List  ? Diagnosis Date Noted  ? Hypercalcemia of malignancy 02/16/2021  ? Chronic systolic heart failure ([place])   ? Abnormal stress test 12/05/2020  ? Depressed left ventricular ejection fraction 12/05/2020  ? Nonrheumatic mitral valve regurgitation 12/05/2020  ? Pre-diabetes 12/05/2020  ? Pulmonary hypertension ([place]) 12/05/2020  ? 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  ? 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[place]) 12/05/2020  ? Tobacco use 12/05/2020  ? Dilated cardiomyopathy ([place]) 12/05/2020  ? HFrEF (heart failure with reduced ejection fraction) ([place]) 11/23/2020  ? CAD (coronary artery disease) 11/23/2020  ? GERD (gastroesophageal reflux disease)   ? Hyperlipidemia   ? Hypertension   ? 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[place]) 06/02/2020  ? Malignant neoplasm of prostate ([place]) 06/02/2020  ? AKI (acute kidney injury) ([place]) 11/25/2019  ? Hyperphosphatemia 11/25/2019  ? 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[place]) 08/24/2019  ? Hyperkalemia 08/24/2019  ? Metabolic bone disease 02/16/1600  ? Vitamin D deficiency 08/24/2019  ? Benign hypertension with chronic kidney disease, stage III ([place]) 05/20/2019  ? Decreased cardiac ejection fraction 05/20/2019  ? 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  ? 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 02/24/2019  ? Olecranon bursitis of left elbow 06/25/2018  ? B12 deficiency 03/22/2018  ? Gastroesophageal reflux disease without esophagitis 03/22/2018  ? Chronic renal insufficiency, stage III (moderate) ([place]) 11/27/2015  ? Aortic valve disorder 07/08/2014  ? 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   ? Cataract 11/2013  ? Essential hypertension 07/06/2012  ? Coronary arteriosclerosis 07/06/2012  ? 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  ? Left bundle branch block 07/06/2012  ? Mixed hyperlipidemia 07/06/2012  ? Cancer ([place]) 08/2008  ? S/P radiation therapy > 12 wks ago 2010  ? ? ?Current Outpatient Medications on File Prior to Visit  ?Medication Sig Dispense Refill  ? acetaminophen (TYLENOL) 500 MG tablet Take 500 mg by mouth every 6 (six) hours as needed for moderate pain or headache.    ? aspirin EC 81 MG tablet Take 1 tablet (81 mg total) by mouth daily. 90 tablet 3  ? Bempedoic Acid (NEXLETOL) 180 MG TABS Take 1 tablet by mouth daily. 90 tablet 3  ? cetirizine (ZYRTEC) 10 MG tablet Take 10 mg by mouth daily as needed for allergies.    ? chlorhexidine (PERIDEX) 0.12 % solution 1 mL by Mouth Rinse route 2 (two) times daily.    ? cholestyramine light (PREVALITE) 4 GM/DOSE powder Take 4 g by mouth daily.    ? dapagliflozin propanediol (FARXIGA) 10 MG TABS tablet Take 1 tablet (10 mg total) by mouth daily before breakfast. 90 tablet 3  ? digoxin (LANOXIN) 0.125 MG tablet Take 0.5 tablets (0.0625 mg total) by mouth every other day. 15 tablet 0  ? enzalutamide (XTANDI) 40 MG tablet Take 160 mg by mouth daily.    ? Evolocumab (REPATHA SURECLICK) 093 MG/ML SOAJ INJECT 1 DOSE INTO THE SKIN EVERY 14 DAYS 6 mL 1  ? metoprolol succinate (TOPROL XL) 25 MG 24 hr tablet Take 0.5 tablets (12.5 mg total) by mouth daily. 45 tablet 3  ? omeprazole (PRILOSEC) 40 MG capsule Take 40 mg  by mouth daily.    ? pregabalin (LYRICA) 100 MG capsule Take 100 mg by mouth 2 (two) times daily.    ? sacubitril-valsartan (ENTRESTO) 24-26 MG Take 1 tablet by mouth 2 (two) times daily. 180 tablet 3  ? spironolactone (ALDACTONE) 25 MG tablet Take 1 tablet (25 mg total) by mouth daily. 30 tablet 11  ? tamsulosin (FLOMAX) 0.4 MG CAPS capsule Take 0.4 mg by mouth daily.    ? torsemide (DEMADEX) 20 MG tablet Take 2  tablets (40 mg total) by mouth 2 (two) times daily. 180 tablet 3  ? ?No current facility-administered medications on file prior to visit.  ? ? ?Allergies  ?Allergen Reactions  ? Ezetimibe Other (See Comments)  ?  Myalgia  ? Gabapentin Itching and Other (See Comments)  ?  Sore throat, breakouts  ? Statins Other (See Comments)  ?  Myalgias (intolerance)  ? ? ?Objective: ?Physical Exam ? ?General: Well developed, nourished, no acute distress, awake, alert and oriented x 3 ? ?Vascular: Dorsalis pedis artery 1/4 bilateral, Posterior tibial artery 1/4 bilateral, skin temperature warm to warm proximal to distal bilateral lower extremities, mild varicosities, scant pedal hair present bilateral. ? ?Neurological: Gross sensation present via light touch bilateral.  ? ?Dermatological: Skin is warm, dry, and supple bilateral, Nails 1-10 are tender, long, thick, and discolored with mild to moderate subungal debris, with worse pain at the right great toe where there is pincer nail deformity, no webspace macerations present bilateral, no open lesions present bilateral, no callus/corns/hyperkeratotic tissue present bilateral. No signs of infection bilateral. ? ?Musculoskeletal: Asymptomatic hallux rigidus worse on the right boney deformities noted bilateral. Muscular strength within normal limits without painon range of motion. No pain with calf compression bilateral. ? ?Assessment and Plan:  ?Problem List Items Addressed This Visit   ?None ?Visit Diagnoses   ? ? Pain due to onychomycosis of toenails of both feet    -  Primary  ? Neuropathy      ? ?  ? ? ?-Examined patient.  ?-Re-Discussed treatment options for painful mycotic nails. ?-Mechanically debrided and reduced all painful mycotic nails with sterile nail nipper and dremel nail file without incident. ?-Patient currently on Lyrica for neuropathy ?-Patient to return in 3 months for follow up evaluation or sooner if symptoms worsen. ? ?Landis Martins, DPM ? ?

## 2021-11-20 NOTE — Progress Notes (Signed)
Remote ICD transmission.   

## 2021-12-04 ENCOUNTER — Inpatient Hospital Stay: Payer: Medicare Other | Attending: Oncology

## 2021-12-04 DIAGNOSIS — C61 Malignant neoplasm of prostate: Secondary | ICD-10-CM | POA: Insufficient documentation

## 2021-12-04 DIAGNOSIS — C7951 Secondary malignant neoplasm of bone: Secondary | ICD-10-CM | POA: Insufficient documentation

## 2021-12-04 DIAGNOSIS — Z79899 Other long term (current) drug therapy: Secondary | ICD-10-CM | POA: Insufficient documentation

## 2021-12-04 LAB — BASIC METABOLIC PANEL
BUN: 53 — AB (ref 4–21)
CO2: 25 — AB (ref 13–22)
Chloride: 102 (ref 99–108)
Creatinine: 2.7 — AB (ref 0.6–1.3)
Glucose: 112
Potassium: 4.4 mEq/L (ref 3.5–5.1)
Sodium: 139 (ref 137–147)

## 2021-12-04 LAB — HEPATIC FUNCTION PANEL
ALT: 11 U/L (ref 10–40)
AST: 20 (ref 14–40)
Alkaline Phosphatase: 40 (ref 25–125)
Bilirubin, Total: 0.8

## 2021-12-04 LAB — CBC AND DIFFERENTIAL
HCT: 44 (ref 41–53)
Hemoglobin: 14.2 (ref 13.5–17.5)
Neutrophils Absolute: 2.4
Platelets: 346 10*3/uL (ref 150–400)
WBC: 4.8

## 2021-12-04 LAB — COMPREHENSIVE METABOLIC PANEL
Albumin: 3.9 (ref 3.5–5.0)
Calcium: 8.8 (ref 8.7–10.7)

## 2021-12-04 LAB — PSA: Prostatic Specific Antigen: <0.01 ng/mL (ref 0.00–4.00)

## 2021-12-04 LAB — CBC: RBC: 4.82 (ref 3.87–5.11)

## 2021-12-05 ENCOUNTER — Other Ambulatory Visit: Payer: Medicare Other

## 2021-12-05 NOTE — Progress Notes (Signed)
?[facility]  ?[address] ?[place],  [place]  [address] ?[phone] ? ?Clinic Day:  12/06/2021 ? ?Referring physician: Serita Grammes, MD ? ? ?HISTORY OF PRESENT ILLNESS:  ?The patient is an 81 y.o. male with metastatic prostate cancer, who is currently supposed to be taking Trelstar/enzalutamide for his complete androgen blockade therapy.  However, he claims he has not has a Administrator shot at his urologist's office in numerous months.  He also is taking Xgeva to protect his bones against worsening disease metastasis.  He comes in today for routine followup.  Since his last visit, the patient has been doing well.  He denies having other systemic symptoms which concern him for overt progression of his metastatic prostate cancer. ? ?PHYSICAL EXAM:  ?Blood pressure (!) 113/51, pulse 70, temperature 97.9 ?F (36.6 ?C), resp. rate 16, height '5\' 9"'$  (1.753 m), weight 179 lb 6.4 oz (81.4 kg), SpO2 97 %. ?Wt Readings from Last 3 Encounters:  ?12/06/21 179 lb 6.4 oz (81.4 kg)  ?10/30/21 182 lb 3.2 oz (82.6 kg)  ?10/22/21 182 lb 12.8 oz (82.9 kg)  ? ?Body mass index is 26.49 kg/m?Marland Kitchen ?Performance status (ECOG): 1 - Symptomatic but completely ambulatory ?Physical Exam ?Constitutional:   ?   Appearance: Normal appearance. He is not ill-appearing.  ?   Comments: He has problems with memory recollection  ?HENT:  ?   Mouth/Throat:  ?   Mouth: Mucous membranes are moist.  ?   Pharynx: Oropharynx is clear. No oropharyngeal exudate or posterior oropharyngeal erythema.  ?Cardiovascular:  ?   Rate and Rhythm: Normal rate and regular rhythm.  ?   Heart sounds: No murmur heard. ?  No friction rub. No gallop.  ?Pulmonary:  ?   Effort: Pulmonary effort is normal. No respiratory distress.  ?   Breath sounds: Normal breath sounds. No wheezing, rhonchi or rales.  ?Abdominal:  ?   General: Bowel sounds are normal. There is no distension.  ?   Palpations: Abdomen is soft. There is no mass.  ?   Tenderness:  There is no abdominal tenderness.  ?Musculoskeletal:     ?   General: No swelling.  ?   Right lower leg: No edema.  ?   Left lower leg: No edema.  ?Lymphadenopathy:  ?   Cervical: No cervical adenopathy.  ?   Upper Body:  ?   Right upper body: No supraclavicular or axillary adenopathy.  ?   Left upper body: No supraclavicular or axillary adenopathy.  ?   Lower Body: No right inguinal adenopathy. No left inguinal adenopathy.  ?Skin: ?   General: Skin is warm.  ?   Coloration: Skin is not jaundiced.  ?   Findings: No lesion or rash.  ?Neurological:  ?   General: No focal deficit present.  ?   Mental Status: He is alert and oriented to person, place, and time. Mental status is at baseline.  ?Psychiatric:     ?   Mood and Affect: Mood normal.     ?   Behavior: Behavior normal.     ?   Thought Content: Thought content normal.  ? ? ?LABS:  ? ? ?  Latest Ref Rng & Units 12/04/2021  ? 12:00 AM 07/16/2021  ? 12:00 AM 06/07/2021  ? 12:00 AM  ?CBC  ?WBC  4.8      5.5      5.3       ?Hemoglobin 13.5 - 17.5 14.2  15.5      43.5       ?Hematocrit 41 - 53 44      46      30       ?Platelets 150 - 400 K/uL 346      278      224       ?  ? This result is from an external source.  ? ? ?  Latest Ref Rng & Units 12/04/2021  ? 12:00 AM 10/01/2021  ?  9:18 AM 09/06/2021  ?  9:57 AM  ?CMP  ?Glucose 70 - 99 mg/dL  117   109    ?BUN 4 - 21 53      51   35    ?Creatinine 0.6 - 1.3 2.7      2.33   2.03    ?Sodium 137 - 147 139      141   140    ?Potassium 3.5 - 5.1 mEq/L 4.4      4.2   5.1    ?Chloride 99 - 108 102      104   103    ?CO2 13 - '22 25      26   24    '$ ?Calcium 8.7 - 10.7 8.8      9.3   9.3    ?Alkaline Phos 25 - 125 40         ?AST 14 - 40 20         ?ALT 10 - 40 U/L 11         ?  ? This result is from an external source.  ? ? Latest Reference Range & Units 12/04/21 09:55  ?Prostatic Specific Antigen 0.00 - 4.00 ng/mL <0.01  ? ?ASSESSMENT & PLAN:  ?Assessment/Plan:  An 81 y.o. male with metastatic prostate cancer.  I am very  pleased as his PSA remains at an undetectable level.  Per his recollection, the patient has not been receiving his androgen deprivation therapy at his urologist's office.  I once again explained to the patient that single-agent antiandrogen therapy usually is not standard of care in treating metastatic prostate cancer.   For now, he will continue taking his enzalutamide daily as his PSA remains undetectable.  Clinically, the patient is doing well.  I will see him back in 3 months for repeat clinical assessment.  The patient understands all the plans discussed today and is in agreement with them.   ? ?Anallely Rosell Macarthur Critchley, MD ? ? ? ?  ?

## 2021-12-06 ENCOUNTER — Inpatient Hospital Stay (INDEPENDENT_AMBULATORY_CARE_PROVIDER_SITE_OTHER): Payer: Medicare Other | Admitting: Oncology

## 2021-12-06 ENCOUNTER — Telehealth: Payer: Self-pay | Admitting: Oncology

## 2021-12-06 ENCOUNTER — Other Ambulatory Visit: Payer: Self-pay | Admitting: Oncology

## 2021-12-06 ENCOUNTER — Other Ambulatory Visit: Payer: Self-pay

## 2021-12-06 VITALS — BP 113/51 | HR 70 | Temp 97.9°F | Resp 16 | Ht 69.0 in | Wt 179.4 lb

## 2021-12-06 DIAGNOSIS — C61 Malignant neoplasm of prostate: Secondary | ICD-10-CM | POA: Diagnosis not present

## 2021-12-06 DIAGNOSIS — C7951 Secondary malignant neoplasm of bone: Secondary | ICD-10-CM

## 2021-12-06 DIAGNOSIS — C7952 Secondary malignant neoplasm of bone marrow: Secondary | ICD-10-CM | POA: Diagnosis not present

## 2021-12-06 NOTE — Telephone Encounter (Signed)
Per 12/06/21 los next appt scheduled and confirmed with patient ?

## 2021-12-09 ENCOUNTER — Encounter: Payer: Self-pay | Admitting: Oncology

## 2021-12-10 ENCOUNTER — Encounter (HOSPITAL_COMMUNITY): Payer: Self-pay | Admitting: Cardiology

## 2021-12-10 ENCOUNTER — Ambulatory Visit (HOSPITAL_COMMUNITY)
Admission: RE | Admit: 2021-12-10 | Discharge: 2021-12-10 | Disposition: A | Discharge status: Home / Self Care | Payer: Medicare Other | Source: Ambulatory Visit | Attending: Cardiology | Admitting: Cardiology

## 2021-12-10 VITALS — BP 92/64 | HR 68 | Wt 179.4 lb

## 2021-12-10 DIAGNOSIS — Z8546 Personal history of malignant neoplasm of prostate: Secondary | ICD-10-CM | POA: Diagnosis not present

## 2021-12-10 DIAGNOSIS — Z7982 Long term (current) use of aspirin: Secondary | ICD-10-CM | POA: Insufficient documentation

## 2021-12-10 DIAGNOSIS — Z79899 Other long term (current) drug therapy: Secondary | ICD-10-CM | POA: Insufficient documentation

## 2021-12-10 DIAGNOSIS — R7989 Other specified abnormal findings of blood chemistry: Secondary | ICD-10-CM | POA: Diagnosis not present

## 2021-12-10 DIAGNOSIS — I340 Nonrheumatic mitral (valve) insufficiency: Secondary | ICD-10-CM | POA: Diagnosis not present

## 2021-12-10 DIAGNOSIS — Z7984 Long term (current) use of oral hypoglycemic drugs: Secondary | ICD-10-CM | POA: Diagnosis not present

## 2021-12-10 DIAGNOSIS — E782 Mixed hyperlipidemia: Secondary | ICD-10-CM

## 2021-12-10 DIAGNOSIS — R188 Other ascites: Secondary | ICD-10-CM | POA: Insufficient documentation

## 2021-12-10 DIAGNOSIS — I130 Hypertensive heart and chronic kidney disease with heart failure and stage 1 through stage 4 chronic kidney disease, or unspecified chronic kidney disease: Secondary | ICD-10-CM | POA: Diagnosis not present

## 2021-12-10 DIAGNOSIS — K761 Chronic passive congestion of liver: Secondary | ICD-10-CM | POA: Diagnosis not present

## 2021-12-10 DIAGNOSIS — I2510 Atherosclerotic heart disease of native coronary artery without angina pectoris: Secondary | ICD-10-CM | POA: Diagnosis present

## 2021-12-10 DIAGNOSIS — I5022 Chronic systolic (congestive) heart failure: Secondary | ICD-10-CM | POA: Diagnosis not present

## 2021-12-10 DIAGNOSIS — F17210 Nicotine dependence, cigarettes, uncomplicated: Secondary | ICD-10-CM | POA: Diagnosis not present

## 2021-12-10 DIAGNOSIS — I255 Ischemic cardiomyopathy: Secondary | ICD-10-CM | POA: Insufficient documentation

## 2021-12-10 DIAGNOSIS — R0600 Dyspnea, unspecified: Secondary | ICD-10-CM | POA: Diagnosis not present

## 2021-12-10 DIAGNOSIS — N183 Chronic kidney disease, stage 3 unspecified: Secondary | ICD-10-CM | POA: Insufficient documentation

## 2021-12-10 LAB — BASIC METABOLIC PANEL
Anion gap: 9 (ref 5–15)
BUN: 64 mg/dL — ABNORMAL HIGH (ref 8–23)
CO2: 25 mmol/L (ref 22–32)
Calcium: 9.3 mg/dL (ref 8.9–10.3)
Chloride: 106 mmol/L (ref 98–111)
Creatinine, Ser: 3.31 mg/dL — ABNORMAL HIGH (ref 0.61–1.24)
GFR, Estimated: 18 mL/min — ABNORMAL LOW (ref >60)
Glucose, Bld: 125 mg/dL — ABNORMAL HIGH (ref 70–99)
Potassium: 4.5 mmol/L (ref 3.5–5.1)
Sodium: 140 mmol/L (ref 135–145)

## 2021-12-10 LAB — LIPID PANEL
Cholesterol: 210 mg/dL — ABNORMAL HIGH (ref 0–200)
HDL: 35 mg/dL — ABNORMAL LOW (ref >40)
LDL Cholesterol: 107 mg/dL — ABNORMAL HIGH (ref 0–99)
Total CHOL/HDL Ratio: 6 RATIO
Triglycerides: 341 mg/dL — ABNORMAL HIGH (ref <150)
VLDL: 68 mg/dL — ABNORMAL HIGH (ref 0–40)

## 2021-12-10 LAB — DIGOXIN LEVEL: Digoxin Level: 0.9 ng/mL (ref 0.8–2.0)

## 2021-12-10 MED ORDER — TORSEMIDE 20 MG PO TABS: 40.0000 mg | ORAL_TABLET | Freq: Every day | ORAL | 3 refills | Status: DC

## 2021-12-10 MED ORDER — SPIRONOLACTONE 25 MG PO TABS: 12.5000 mg | ORAL_TABLET | Freq: Every day | ORAL | 11 refills | Status: DC

## 2021-12-10 NOTE — Progress Notes (Signed)
PCP: Serita Grammes, MD ?Cardiology: Dr. [name] ?HF Cardiology: Dr. Aundra Dubin ? ?81 y.o. with history of prostate cancer, CKD stage 3, CAD, and ischemic cardiomyopathy was referred by Dr. [name] for evaluation of CHF.  Patient developed gradually worsening exertional dyspnea starting early this year.  The shortness of breath has been especially bad for about 1.5 months.  He had an echo done in 4/22, showing EF < 20%, moderate-severe LV dilation, normal RV, severe MR.  LHC/RHC was done showing low cardiac index at 1.81 and occluded mid LAD.  No intervention.  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  ? ?TEE was done 6/22, showing EF < 20% with septal-lateral dyssynchrony, mildly decreased RV systolic function, moderate TR, moderate central MR with ERO 0.2 cm^2.  Echo in 7/22 similarly showed EF 20%, normal RV, moderate MR.  Patient had Medtronic CRT-D device implanted.  Echo in 1/23 showed EF <20% with moderate LV dilation, mildly decreased RV systolic function, normal IVC.  ? ?Patient returns for followup of CHF.  BP is low today but he denies lightheadedness.  He is still smoking 1/2 ppd.  No significant exertional dyspnea with ADLs.  No chest pain.  He does fatigue after walking a long distance.  Nexletol recently started with persistently elevated LDL. Weight down 4 lbs.  ? ?Labs (5/22): K 3.4, creatinine 1.7 => 2.15, AST 123 => 189, ALT 322 =>190, tbili 1.7, Hgb 12, LDL 38 ?Labs (6/22): Viral hepatitis serologies negative ?Labs (8/22): K 4.2, creatinine 1.87 ?Labs (10/22): K 3.7, creatinine 2 ?Labs (11/22): LDL 159 ?Labs (12/22): K 3.7, creatinine 2 ?Labs (4/23): K 4.4, creatinine 2.7, LFTs normal ? ?PMH:  ?1. Hyperlipidemia ?2. HTN ?3. CKD stage 3 ?4. 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  Treated with radiation and currently on denosumab.   ?5. Cholecystectomy ?6. CAD: LHC in 4/22 with 80% proximal RCA stenosis  (nondominant), totally occluded mLAD with collaterals, 60% D1.  No intervention.  ?7. Chronic systolic CHF: Ischemic cardiomyopathy.  Medtronic CRT-D.  ?- Echo (4/22) with EF < 20%, moderate-severe LV dilation, normal RV, severe LAE, severe MR.  ?- RHC (4/22): mean RA 3, PA 39/10, mean PCWP 12, CI 1.81 ?- Echo (6/22): EF < 20% with septal-lateral dyssynchrony, mildly decreased RV systolic function, moderate TR, moderate central MR with ERO 0.2 cm^2.  ?- Echo (7/22): EF <20%, mild LVH, normal RV, moderate MR.  ?- Echo (1/23: EF <20% with moderate LV dilation, mildly decreased RV systolic function, normal IVC, mild-moderate MR. ?8. Mitral regurgitation: Severe on 4/22 echo.  ?- TEE (6/22) with moderate central MR with ERO 0.2 cm^2 (functional, annular dilatation).  ?- Echo (1/23) with mild-moderate MR.  ?9. Elevated LFTs: Viral hepatitis labs negative.  RUQ [place] (6/22) was not suggestive of cirrhosis, ascites noted.  ?10. Hypercalcemia: Due to Ca supplementation in setting of renal dysfunction.  ?11. Chronic LBBB ? ?Social History  ? ?Socioeconomic History  ? Marital status: Married  ?  Spouse name: Not on file  ? Number of children: Not on file  ? Years of education: Not on file  ? Highest education level: Not on file  ?Occupational History  ? Not on file  ?Tobacco Use  ? Smoking status: Every Day  ?  Packs/day: 0.50  ?  Years: 50.00  ?  Pack years: 25.00  ?  Types: Cigarettes  ? Smokeless tobacco: Never  ?Vaping Use  ? Vaping Use: Never used  ?Substance  and Sexual Activity  ? Alcohol use: Not Currently  ? Drug use: Never  ? Sexual activity: Not on file  ?Other Topics Concern  ? Not on file  ?Social History Narrative  ? Not on file  ? ?Social Determinants of Health  ? ?Financial Resource Strain: Not on file  ?Food Insecurity: Not on file  ?Transportation Needs: Not on file  ?Physical Activity: Not on file  ?Stress: Not on file  ?Social Connections: Not on file  ?Intimate Partner Violence: Not on file  ? ?Family History   ?Problem Relation Age of Onset  ? Leukemia Mother   ? Prostate cancer Father   ? Heart attack Brother   ? Colon cancer Neg Hx   ? Rectal cancer Neg Hx   ? Stomach cancer Neg Hx   ? Esophageal cancer Neg Hx   ? ?ROS: All systems reviewed and negative except as per HPI.  ? ?Current Outpatient Medications  ?Medication Sig Dispense Refill  ? acetaminophen (TYLENOL) 500 MG tablet Take 500 mg by mouth every 6 (six) hours as needed for moderate pain or headache.    ? aspirin EC 81 MG tablet Take 1 tablet (81 mg total) by mouth daily. 90 tablet 3  ? Bempedoic Acid (NEXLETOL) 180 MG TABS Take 1 tablet by mouth daily. 90 tablet 3  ? Cetirizine HCl 10 MG CAPS Take 1 capsule by mouth daily.    ? chlorhexidine (PERIDEX) 0.12 % solution 1 mL by Mouth Rinse route 2 (two) times daily.    ? dapagliflozin propanediol (FARXIGA) 10 MG TABS tablet Take 1 tablet (10 mg total) by mouth daily before breakfast. 90 tablet 3  ? digoxin (LANOXIN) 0.125 MG tablet Take 0.5 tablets (0.0625 mg total) by mouth every other day. 15 tablet 0  ? enzalutamide (XTANDI) 40 MG tablet Take 160 mg by mouth daily.    ? Evolocumab (REPATHA SURECLICK) 829 MG/ML SOAJ INJECT 1 DOSE INTO THE SKIN EVERY 14 DAYS 6 mL 1  ? metoprolol succinate (TOPROL XL) 25 MG 24 hr tablet Take 0.5 tablets (12.5 mg total) by mouth daily. 45 tablet 3  ? pregabalin (LYRICA) 100 MG capsule Take 100 mg by mouth 2 (two) times daily.    ? sacubitril-valsartan (ENTRESTO) 24-26 MG Take 1 tablet by mouth 2 (two) times daily. 180 tablet 3  ? tamsulosin (FLOMAX) 0.4 MG CAPS capsule Take 0.4 mg by mouth daily.    ? spironolactone (ALDACTONE) 25 MG tablet Take 0.5 tablets (12.5 mg total) by mouth daily. 30 tablet 11  ? torsemide (DEMADEX) 20 MG tablet Take 2 tablets (40 mg total) by mouth daily. 180 tablet 3  ? ?No current facility-administered medications for this encounter.  ? ?BP 92/64   Pulse 68   Wt 81.4 kg (179 lb 6.4 oz)   SpO2 96%   BMI 26.49 kg/m?  ?General: NAD ?Neck: No JVD, no  thyromegaly or thyroid nodule.  ?Lungs: Clear to auscultation bilaterally with normal respiratory effort. ?CV: Nondisplaced PMI.  Heart regular S1/S2, no S3/S4, no murmur.  No peripheral edema.  No carotid bruit.  Normal pedal pulses.  ?Abdomen: Soft, nontender, no hepatosplenomegaly, no distention.  ?Skin: Intact without lesions or rashes.  ?Neurologic: Alert and oriented x 3.  ?Psych: Normal affect. ?Extremities: No clubbing or cyanosis.  ?HEENT: Normal.  ? ?Assessment/Plan: ?1. CAD: Occluded mid LAD and 80% proximal stenosis in nondominant RCA in 4/22.  No intervention.  No chest pain.  ?- Continue ASA 81 mg daily.  ?-  Continue Repatha and Nexletol (intolerant of statins and Zetia).  Check lipids today, goal LDL < 55.  ?2. Chronic systolic CHF: Ischemic cardiomyopathy.  Echo in 4/22 with EF < 20%, moderate-severe LV dilation, normal RV, severe LAE, severe MR.  RHC in 4/22 with CI 1.8.  TEE in 6/22 with EF <20%, dyssynchrony, mildly decreased RV systolic function.  Echo in 7/22 with EF < 20%, moderate MR.  Medtronic CRT-D device placed.  Echo in 1/23 showed EF < 20%, mild RV dysfunction.  No change to echo since CRT but he feels better symptomatically.  NYHA class I-II, he is not volume overloaded on exam.  BP runs low but he denies orthostatic symptoms. Creatinine has been trending up.  ?- Continue Toprol XL 12.5 mg daily.  ?- Continue Farxiga 10 mg daily.     ?- Decrease spironolactone to 12.5 mg qhs with soft BP.  ?- Continue digoxin 0.0625 mg daily (lower dose with CKD stage III), check level today.   ?- Continue Entresto 24/26 bid, BMET today.    ?- Decrease torsemide to 40 mg daily.  ?3. Mitral regurgitation: Severe on 5/22 TTE. However, TEE in 6/22 showed moderate functional central MR.   Mild-moderate MR on echo 1/23 post-CRT.  ?4. Elevated LFTs: Viral hepatitis labs negative.  Abdominal US did not show cirrhosis but did show ascites.  Etiology uncertain, ?congestive hepatopathy.  ?- Repeat LFTs in 4/23  were normal.  ?5. CKD stage 3: Follow closely with medication titration, BMET today.  ?6. Smoking: Active, encouraged him again to quit.  ? ?Followup in 2 months with APP.  ? ?Loralie Champagne ?12/10/2021 ? ? ? ?

## 2021-12-10 NOTE — Patient Instructions (Signed)
Medication Changes: ? ?Stop Trental ? ?Decrease spironolactone to 12.'5mg'$  (1/2) Tab daily ? ?Decrease Torsemide to '40mg'$  daily ? ?Lab Work: ? ?Labs done today, your results will be available in MyChart, we will contact you for abnormal readings. ? ? ?Testing/Procedures: ? ?none ? ?Referrals: ? ?none ? ?Special Instructions // Education: ? ?none ? ?Follow-Up in: 2 months  ? ?At the [facility], you and your health needs are our priority. We have a designated team specialized in the treatment of Heart Failure. This Care Team includes your primary Heart Failure Specialized Cardiologist (physician), Advanced Practice Providers (APPs- Physician Assistants and Nurse Practitioners), and Pharmacist who all work together to provide you with the care you need, when you need it.  ? ?You may see any of the following providers on your designated Care Team at your next follow up: ? ?Dr Glori Bickers ?Dr Loralie Champagne ?Darrick Grinder, NP ?Lyda Jester, PA ?Jessica Milford,NP ?Marlyce Huge, PA ?Audry Riles, PharmD ? ? ?Please be sure to bring in all your medications bottles to every appointment.  ? ?Need to Contact us: ? ?If you have any questions or concerns before your next appointment please send [place] a message through [place] or call our office at [phone].   ? ?TO LEAVE A MESSAGE FOR THE NURSE SELECT OPTION 2, PLEASE LEAVE A MESSAGE INCLUDING: ?YOUR NAME ?DATE OF BIRTH ?CALL BACK NUMBER ?REASON FOR CALL**this is important as we prioritize the call backs ? ?YOU WILL RECEIVE A CALL BACK THE SAME DAY AS LONG AS YOU CALL BEFORE 4:00 PM ? ? ?

## 2021-12-11 ENCOUNTER — Other Ambulatory Visit: Payer: Self-pay | Admitting: Cardiology

## 2021-12-12 ENCOUNTER — Other Ambulatory Visit (HOSPITAL_COMMUNITY): Payer: Self-pay

## 2021-12-12 ENCOUNTER — Other Ambulatory Visit (HOSPITAL_COMMUNITY): Payer: Self-pay | Admitting: *Deleted

## 2021-12-12 ENCOUNTER — Telehealth (HOSPITAL_COMMUNITY): Payer: Self-pay | Admitting: Cardiology

## 2021-12-12 ENCOUNTER — Telehealth (HOSPITAL_COMMUNITY): Payer: Self-pay | Admitting: Pharmacy Technician

## 2021-12-12 DIAGNOSIS — I5022 Chronic systolic (congestive) heart failure: Secondary | ICD-10-CM

## 2021-12-12 MED ORDER — DAPAGLIFLOZIN PROPANEDIOL 10 MG PO TABS: 10.0000 mg | ORAL_TABLET | Freq: Every day | ORAL | 3 refills | Status: DC

## 2021-12-12 NOTE — Telephone Encounter (Signed)
Patient called.  Patient aware. ?Order mailed will have labs repeated at PCP ?

## 2021-12-12 NOTE — Telephone Encounter (Signed)
-----   Message from Larey Dresser, MD sent at 12/10/2021 10:04 PM EDT ----- ?LDL still too high but better.  Make sure he has followup with lipid clinic.  He needs to stop Entresto with creatinine higher.  Check BMET 1 week.  ?

## 2021-12-12 NOTE — Telephone Encounter (Signed)
Advanced Heart Failure Patient Advocate Encounter ? ?Received a PA request for Tommy Ward. When submitted to insurance, patient's insurance blocked the completion of the PA. Reasoning, patient is enrolled in [employer] assistance for the same medication. Cannot submit PA at this time. Patient should not be filling the medication at the pharmacy. Looks like new RX needs to be sent to [employer]. Sent 90 day RX request to [facility] ([facility]). ? ?[name], CPhT ? ?

## 2021-12-18 ENCOUNTER — Telehealth (HOSPITAL_COMMUNITY): Payer: Self-pay | Admitting: *Deleted

## 2021-12-18 NOTE — Telephone Encounter (Signed)
Pts wife called to review medication list she wanted to make sure pt is not supposed to take losartan. Advised to not take losartan.  ?

## 2021-12-19 ENCOUNTER — Other Ambulatory Visit: Payer: Self-pay

## 2021-12-19 DIAGNOSIS — I5022 Chronic systolic (congestive) heart failure: Secondary | ICD-10-CM

## 2021-12-20 LAB — BASIC METABOLIC PANEL
BUN/Creatinine Ratio: 17 (ref 10–24)
BUN: 47 mg/dL — ABNORMAL HIGH (ref 8–27)
CO2: 27 mmol/L (ref 20–29)
Calcium: 9.5 mg/dL (ref 8.6–10.2)
Chloride: 99 mmol/L (ref 96–106)
Creatinine, Ser: 2.69 mg/dL — ABNORMAL HIGH (ref 0.76–1.27)
Glucose: 116 mg/dL — ABNORMAL HIGH (ref 70–99)
Potassium: 4.4 mmol/L (ref 3.5–5.2)
Sodium: 139 mmol/L (ref 134–144)
eGFR: 23 mL/min/{1.73_m2} — ABNORMAL LOW (ref >59)

## 2021-12-21 ENCOUNTER — Telehealth (HOSPITAL_COMMUNITY): Payer: Self-pay

## 2021-12-21 NOTE — Telephone Encounter (Signed)
Patients wife called stating that Dr. Renaye Rakers, neprologist at [facility] wanted patient to stop spironolactone due to patients creatine level being 2.60. she wanted to know if that was ok before stopping, please advise  ?

## 2021-12-22 NOTE — Telephone Encounter (Signed)
We just adjusted his meds at office visit.  Have him come in for BMET this week before any more changes.  ?

## 2021-12-24 ENCOUNTER — Encounter (HOSPITAL_COMMUNITY): Payer: Self-pay | Admitting: *Deleted

## 2021-12-24 NOTE — Telephone Encounter (Signed)
Message sent via my chart

## 2022-01-02 ENCOUNTER — Other Ambulatory Visit (HOSPITAL_COMMUNITY): Payer: Self-pay | Admitting: Internal Medicine

## 2022-01-03 ENCOUNTER — Other Ambulatory Visit (HOSPITAL_COMMUNITY): Payer: Self-pay

## 2022-01-03 MED ORDER — DIGOXIN 125 MCG PO TABS: 0.0625 mg | ORAL_TABLET | ORAL | 1 refills | Status: DC

## 2022-01-15 ENCOUNTER — Encounter: Payer: Self-pay | Admitting: Internal Medicine

## 2022-02-04 ENCOUNTER — Encounter: Payer: Self-pay | Admitting: Podiatry

## 2022-02-04 ENCOUNTER — Ambulatory Visit (INDEPENDENT_AMBULATORY_CARE_PROVIDER_SITE_OTHER): Payer: Medicare Other | Admitting: Podiatry

## 2022-02-04 DIAGNOSIS — M79675 Pain in left toe(s): Secondary | ICD-10-CM | POA: Diagnosis not present

## 2022-02-04 DIAGNOSIS — M79674 Pain in right toe(s): Secondary | ICD-10-CM

## 2022-02-04 DIAGNOSIS — B351 Tinea unguium: Secondary | ICD-10-CM | POA: Diagnosis not present

## 2022-02-04 DIAGNOSIS — G629 Polyneuropathy, unspecified: Secondary | ICD-10-CM

## 2022-02-04 NOTE — Progress Notes (Signed)
Subjective: Tommy Ward is a 81 y.o. male patient seen today in office with complaint of mildly painful thickened and elongated toenails; unable to trim.  Denies any changes with medical history since last encounter.  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 Patient is assisted by wife this visit.  PCP visit 2 months ago.  Patient Active Problem List   Diagnosis Date Noted   Hypercalcemia of malignancy 31/54/0086   Chronic systolic heart failure (HCC)    Abnormal stress test 12/05/2020   Depressed left ventricular ejection fraction 12/05/2020   Nonrheumatic mitral valve regurgitation 12/05/2020   Pre-diabetes 12/05/2020   Pulmonary hypertension ([place]) 12/05/2020   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[place]) 12/05/2020   Tobacco use 12/05/2020   Dilated cardiomyopathy ([place]) 12/05/2020   HFrEF (heart failure with reduced ejection fraction) ([place]) 11/23/2020   CAD (coronary artery disease) 11/23/2020   GERD (gastroesophageal reflux disease)    Hyperlipidemia    Hypertension    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/02/2020   Malignant neoplasm of prostate ([place]) 06/02/2020   AKI (acute kidney injury) ([place]) 11/25/2019   Hyperphosphatemia 11/25/2019   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[place]) 08/24/2019   Hyperkalemia 76/19/5093   Metabolic bone disease 26/71/2458   Vitamin D deficiency 08/24/2019   Benign hypertension with chronic kidney disease, stage III ([place]) 05/20/2019   Decreased cardiac ejection fraction 05/20/2019   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 02/24/2019   Olecranon bursitis of left elbow 06/25/2018   B12 deficiency 03/22/2018   Gastroesophageal reflux disease without esophagitis 03/22/2018   Chronic renal insufficiency, stage III (moderate) (HCC) 11/27/2015   Aortic valve disorder 07/08/2014   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    Cataract 11/2013   Essential hypertension 07/06/2012   Coronary arteriosclerosis 07/06/2012   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   Left bundle branch block 07/06/2012   Mixed hyperlipidemia 07/06/2012   Cancer ([place]) 08/2008   S/P radiation therapy > 12 wks ago 2010    Current Outpatient Medications on File Prior to Visit  Medication Sig Dispense Refill   cholestyramine light (PREVALITE) 4 g packet Take by mouth.     acetaminophen (TYLENOL) 500 MG tablet Take 500 mg by mouth every 6 (six) hours as needed for moderate pain or headache.     amoxicillin (AMOXIL) 500 MG capsule Take 500 mg by mouth 4 (four) times daily.     aspirin EC 81 MG tablet Take 1 tablet (81 mg total) by mouth daily. 90 tablet 3   Bempedoic Acid (NEXLETOL) 180 MG TABS Take 1 tablet by mouth daily. 90 tablet 3   Cetirizine HCl 10 MG CAPS Take 1 capsule by mouth daily.     chlorhexidine (PERIDEX) 0.12 % solution 1 mL by Mouth Rinse route 2 (two) times daily.     dapagliflozin propanediol (FARXIGA) 10 MG TABS tablet Take 1 tablet (10 mg total) by mouth daily before breakfast. 90 tablet 3   digoxin (LANOXIN) 0.125 MG tablet Take 0.5 tablets (0.0625 mg total) by mouth every other day. 15 tablet 1   ENTRESTO 24-26 MG Take 1 tablet by mouth 2 (two) times daily.     enzalutamide (XTANDI) 40 MG tablet Take 160 mg by mouth daily.     Evolocumab (REPATHA SURECLICK) 099 MG/ML SOAJ INJECT 1 DOSE INTO THE SKIN EVERY 14 DAYS 6 mL 1   losartan (COZAAR) 25 MG  tablet Take 12.5 mg by mouth daily.     metoprolol succinate (TOPROL-XL) 25 MG 24 hr tablet Take 0.5 tablets (12.5 mg total) by mouth daily. 45 tablet 2   omeprazole (PRILOSEC) 40 MG capsule Take 40 mg by mouth daily.     pregabalin (LYRICA) 100 MG capsule Take 100 mg by mouth 2 (two) times daily.     spironolactone (ALDACTONE) 25 MG tablet Take 0.5 tablets (12.5 mg total) by mouth daily. 30 tablet 11   tamsulosin (FLOMAX) 0.4 MG CAPS capsule  Take 0.4 mg by mouth daily.     torsemide (DEMADEX) 20 MG tablet Take 2 tablets (40 mg total) by mouth daily. 180 tablet 3   Vitamin E 450 MG (1000 UT) CAPS Take 1 capsule by mouth 2 (two) times daily.     No current facility-administered medications on file prior to visit.    Allergies  Allergen Reactions   Ezetimibe Other (See Comments)    Myalgia   Gabapentin Itching and Other (See Comments)    Sore throat, breakouts   Statins Other (See Comments)    Myalgias (intolerance)    Objective: Physical Exam  General: Well developed, nourished, no acute distress, awake, alert and oriented x 3  Vascular: Dorsalis pedis artery 1/4 bilateral, Posterior tibial artery 1/4 bilateral, skin temperature warm to warm proximal to distal bilateral lower extremities, mild varicosities, scant pedal hair present bilateral.  Neurological: Gross sensation present via light touch bilateral.   Dermatological: Skin is warm, dry, and supple bilateral, Nails 1-10 are tender, long, thick, and discolored with mild to moderate subungal debris, with worse pain at the right great toe where there is pincer nail deformity, no webspace macerations present bilateral, no open lesions present bilateral, no callus/corns/hyperkeratotic tissue present bilateral. No signs of infection bilateral.  Musculoskeletal: Asymptomatic hallux rigidus worse on the right boney deformities noted bilateral. Muscular strength within normal limits without painon range of motion. No pain with calf compression bilateral.  Assessment and Plan:  Problem List Items Addressed This Visit   None    -Examined patient.  -Re-Discussed treatment options for painful mycotic nails. -Mechanically debrided and reduced all painful mycotic nails with sterile nail nipper and dremel nail file without incident. -Patient currently on Lyrica for neuropathy -Patient to return in 3 months for follow up evaluation or sooner if symptoms worsen.  Lorenda Peck, DPM

## 2022-02-08 ENCOUNTER — Ambulatory Visit (HOSPITAL_COMMUNITY)
Admission: RE | Admit: 2022-02-08 | Discharge: 2022-02-08 | Disposition: A | Discharge status: Home / Self Care | Payer: Medicare Other | Source: Ambulatory Visit | Attending: Internal Medicine | Admitting: Internal Medicine

## 2022-02-08 ENCOUNTER — Telehealth: Payer: Self-pay

## 2022-02-08 ENCOUNTER — Encounter (HOSPITAL_COMMUNITY): Payer: Self-pay

## 2022-02-08 VITALS — BP 106/56 | HR 68 | Wt 172.8 lb

## 2022-02-08 DIAGNOSIS — R7401 Elevation of levels of liver transaminase levels: Secondary | ICD-10-CM | POA: Insufficient documentation

## 2022-02-08 DIAGNOSIS — I2510 Atherosclerotic heart disease of native coronary artery without angina pectoris: Secondary | ICD-10-CM | POA: Diagnosis not present

## 2022-02-08 DIAGNOSIS — I255 Ischemic cardiomyopathy: Secondary | ICD-10-CM | POA: Insufficient documentation

## 2022-02-08 DIAGNOSIS — R7989 Other specified abnormal findings of blood chemistry: Secondary | ICD-10-CM

## 2022-02-08 DIAGNOSIS — Z7982 Long term (current) use of aspirin: Secondary | ICD-10-CM | POA: Insufficient documentation

## 2022-02-08 DIAGNOSIS — Z8042 Family history of malignant neoplasm of prostate: Secondary | ICD-10-CM | POA: Diagnosis not present

## 2022-02-08 DIAGNOSIS — Z923 Personal history of irradiation: Secondary | ICD-10-CM | POA: Insufficient documentation

## 2022-02-08 DIAGNOSIS — Z79899 Other long term (current) drug therapy: Secondary | ICD-10-CM | POA: Diagnosis not present

## 2022-02-08 DIAGNOSIS — Z8546 Personal history of malignant neoplasm of prostate: Secondary | ICD-10-CM | POA: Insufficient documentation

## 2022-02-08 DIAGNOSIS — Z85830 Personal history of malignant neoplasm of bone: Secondary | ICD-10-CM | POA: Insufficient documentation

## 2022-02-08 DIAGNOSIS — N183 Chronic kidney disease, stage 3 unspecified: Secondary | ICD-10-CM | POA: Diagnosis not present

## 2022-02-08 DIAGNOSIS — F17210 Nicotine dependence, cigarettes, uncomplicated: Secondary | ICD-10-CM | POA: Insufficient documentation

## 2022-02-08 DIAGNOSIS — Z720 Tobacco use: Secondary | ICD-10-CM

## 2022-02-08 DIAGNOSIS — I5022 Chronic systolic (congestive) heart failure: Secondary | ICD-10-CM | POA: Diagnosis present

## 2022-02-08 DIAGNOSIS — I340 Nonrheumatic mitral (valve) insufficiency: Secondary | ICD-10-CM | POA: Diagnosis not present

## 2022-02-08 LAB — BASIC METABOLIC PANEL
Anion gap: 11 (ref 5–15)
BUN: 33 mg/dL — ABNORMAL HIGH (ref 8–23)
CO2: 26 mmol/L (ref 22–32)
Calcium: 8.5 mg/dL — ABNORMAL LOW (ref 8.9–10.3)
Chloride: 102 mmol/L (ref 98–111)
Creatinine, Ser: 2.44 mg/dL — ABNORMAL HIGH (ref 0.61–1.24)
GFR, Estimated: 26 mL/min — ABNORMAL LOW (ref >60)
Glucose, Bld: 115 mg/dL — ABNORMAL HIGH (ref 70–99)
Potassium: 3.8 mmol/L (ref 3.5–5.1)
Sodium: 139 mmol/L (ref 135–145)

## 2022-02-08 LAB — DIGOXIN LEVEL: Digoxin Level: 0.7 ng/mL — ABNORMAL LOW (ref 0.8–2.0)

## 2022-02-18 ENCOUNTER — Other Ambulatory Visit (HOSPITAL_COMMUNITY): Payer: Self-pay | Admitting: Cardiology

## 2022-02-18 ENCOUNTER — Other Ambulatory Visit: Payer: Self-pay | Admitting: Internal Medicine

## 2022-02-18 DIAGNOSIS — E7849 Other hyperlipidemia: Secondary | ICD-10-CM

## 2022-02-18 DIAGNOSIS — I2510 Atherosclerotic heart disease of native coronary artery without angina pectoris: Secondary | ICD-10-CM

## 2022-02-21 ENCOUNTER — Ambulatory Visit: Payer: Medicare Other | Admitting: Internal Medicine

## 2022-02-27 ENCOUNTER — Ambulatory Visit (INDEPENDENT_AMBULATORY_CARE_PROVIDER_SITE_OTHER): Payer: Medicare Other

## 2022-02-27 ENCOUNTER — Telehealth: Payer: Self-pay | Admitting: Internal Medicine

## 2022-02-27 DIAGNOSIS — I420 Dilated cardiomyopathy: Secondary | ICD-10-CM | POA: Diagnosis not present

## 2022-02-27 LAB — CUP PACEART INCLINIC DEVICE CHECK
Battery Remaining Longevity: 75 mo
Brady Statistic RA Percent Paced: 51 %
Brady Statistic RV Percent Paced: 90 %
Date Time Interrogation Session: 20230712082600
HighPow Impedance: 84.375
Implantable Lead Implant Date: 20220919
Implantable Lead Implant Date: 20220919
Implantable Lead Implant Date: 20220919
Implantable Lead Location: 753858
Implantable Lead Location: 753859
Implantable Lead Location: 753860
Implantable Pulse Generator Implant Date: 20220919
Lead Channel Impedance Value: 487.5 Ohm
Lead Channel Impedance Value: 537.5 Ohm
Lead Channel Impedance Value: 775 Ohm
Lead Channel Pacing Threshold Amplitude: 0.5 V
Lead Channel Pacing Threshold Amplitude: 0.5 V
Lead Channel Pacing Threshold Amplitude: 0.75 V
Lead Channel Pacing Threshold Amplitude: 0.75 V
Lead Channel Pacing Threshold Amplitude: 1 V
Lead Channel Pacing Threshold Amplitude: 1 V
Lead Channel Pacing Threshold Pulse Width: 0.5 ms
Lead Channel Pacing Threshold Pulse Width: 0.5 ms
Lead Channel Pacing Threshold Pulse Width: 0.5 ms
Lead Channel Pacing Threshold Pulse Width: 0.5 ms
Lead Channel Pacing Threshold Pulse Width: 0.7 ms
Lead Channel Pacing Threshold Pulse Width: 0.7 ms
Lead Channel Sensing Intrinsic Amplitude: 0.9 mV
Lead Channel Sensing Intrinsic Amplitude: 12 mV
Lead Channel Setting Pacing Amplitude: 1.75 V
Lead Channel Setting Pacing Amplitude: 2 V
Lead Channel Setting Pacing Amplitude: 2.5 V
Lead Channel Setting Pacing Pulse Width: 0.5 ms
Lead Channel Setting Pacing Pulse Width: 0.7 ms
Lead Channel Setting Sensing Sensitivity: 0.5 mV
Pulse Gen Serial Number: 111046561

## 2022-02-27 NOTE — Telephone Encounter (Signed)
Spoke with patient's wife. She is calling about assistance for [employer].  Explained that [employer] has no funds available  Will send her Forensic scientist for [employer] via [employer]

## 2022-02-27 NOTE — Telephone Encounter (Signed)
Called provided number. Spoke with Nunzio Cory. She wants to know if the grant money is still available because it's time for him to renew his prescription. Made her aware I will reach out to another nurse to gather more information. She verbalized understanding.

## 2022-02-27 NOTE — Telephone Encounter (Signed)
Patient called and mentioned that she needed to talk with someone regarding the patients medication Farxiga. Please call back to discuss

## 2022-02-27 NOTE — Progress Notes (Signed)
CRT-D device check in office. Patient OV to pair new home monitor, patient c/o of intermittent phrenic nerve stimulation  Thresholds and sensing consistent with previous device measurements. Only vector without phrenic nerve Mid 2 -prox 4 which patient is currently programmed threshold 1.0V'@0'$ .99m, output decreased from 2.0V'@0'$ .786mto 1.75V'@0'$ .99m66mead impedance trends stable over time. No mode switch episodes recorded. No ventricular arrhythmia episodes recorded. Patient bi-ventricularly pacing 90% of the time. Device programmed with appropriate safety margins. Heart failure diagnostics reviewed and trends are stable for patient. Audible/vibratory alerts demonstrated for patient. Estimated longevity 6.2 years. Patient enrolled in remote follow up. Plan to check device remotely in 3 months and see in office in 6 months. Patient education completed including shock plan.

## 2022-03-06 ENCOUNTER — Inpatient Hospital Stay: Payer: Medicare Other | Attending: Oncology

## 2022-03-06 DIAGNOSIS — C61 Malignant neoplasm of prostate: Secondary | ICD-10-CM | POA: Insufficient documentation

## 2022-03-06 DIAGNOSIS — C7951 Secondary malignant neoplasm of bone: Secondary | ICD-10-CM | POA: Diagnosis present

## 2022-03-06 NOTE — Progress Notes (Signed)
[facility]  [address] [place],  [place]  [address] [phone]  Clinic Day:  03/07/2022  Referring physician: Serita Grammes, MD   HISTORY OF PRESENT ILLNESS:  The patient is an 81 y.o. male with metastatic prostate cancer, who is currently supposed to be taking Trelstar/enzalutamide for his complete androgen blockade therapy.  However, he has not had a Trelstar shot at his urologist's office in numerous months.  He comes in today for routine followup.  Since his last visit, the patient has been doing well.  He denies having other systemic symptoms which concern him for overt progression of his metastatic prostate cancer.  PHYSICAL EXAM:  Blood pressure (!) 98/55, pulse 77, temperature 98.5 F (36.9 C), resp. rate 16, height '5\' 9"'$  (1.753 m), weight 175 lb 11.2 oz (79.7 kg), SpO2 97 %. Wt Readings from Last 3 Encounters:  03/07/22 175 lb 11.2 oz (79.7 kg)  02/08/22 172 lb 12.8 oz (78.4 kg)  12/10/21 179 lb 6.4 oz (81.4 kg)   Body mass index is 25.95 kg/m. Performance status (ECOG): 1 - Symptomatic but completely ambulatory Physical Exam Constitutional:      Appearance: Normal appearance. He is not ill-appearing.     Comments: He has problems with memory recollection  HENT:     Mouth/Throat:     Mouth: Mucous membranes are moist.     Pharynx: Oropharynx is clear. No oropharyngeal exudate or posterior oropharyngeal erythema.  Cardiovascular:     Rate and Rhythm: Normal rate and regular rhythm.     Heart sounds: No murmur heard.    No friction rub. No gallop.  Pulmonary:     Effort: Pulmonary effort is normal. No respiratory distress.     Breath sounds: Normal breath sounds. No wheezing, rhonchi or rales.  Abdominal:     General: Bowel sounds are normal. There is no distension.     Palpations: Abdomen is soft. There is no mass.     Tenderness: There is no abdominal tenderness.  Musculoskeletal:        General: No swelling.     Right  lower leg: No edema.     Left lower leg: No edema.  Lymphadenopathy:     Cervical: No cervical adenopathy.     Upper Body:     Right upper body: No supraclavicular or axillary adenopathy.     Left upper body: No supraclavicular or axillary adenopathy.     Lower Body: No right inguinal adenopathy. No left inguinal adenopathy.  Skin:    General: Skin is warm.     Coloration: Skin is not jaundiced.     Findings: No lesion or rash.  Neurological:     General: No focal deficit present.     Mental Status: He is alert and oriented to person, place, and time. Mental status is at baseline.  Psychiatric:        Mood and Affect: Mood normal.        Behavior: Behavior normal.        Thought Content: Thought content normal.     LABS:   03/06/22 1210   Result status: Final  Resulting lab: [facility]  Value: COMMENT  Comment: (NOTE)  Test Ordered: [id number] Prostate-Specific Ag   Prostate Specific Ag           <0.1          ASSESSMENT & PLAN:  Assessment/Plan:  An 81 y.o. male with metastatic prostate cancer.  I  am very pleased as his PSA remains at an undetectable level.  This for now, he will continue taking his enzalutamide daily as it appears to be effective as a single agent in controlling his disease.  Clinically, the patient is doing well.  I will see him back in 3 months for repeat clinical assessment.  The patient understands all the plans discussed today and is in agreement with them.    Shianna Bally Macarthur Critchley, MD

## 2022-03-07 ENCOUNTER — Inpatient Hospital Stay (INDEPENDENT_AMBULATORY_CARE_PROVIDER_SITE_OTHER): Payer: Medicare Other | Admitting: Oncology

## 2022-03-07 VITALS — BP 98/55 | HR 77 | Temp 98.5°F | Resp 16 | Ht 69.0 in | Wt 175.7 lb

## 2022-03-07 DIAGNOSIS — C61 Malignant neoplasm of prostate: Secondary | ICD-10-CM

## 2022-03-08 LAB — MISC LABCORP TEST (SEND OUT): Labcorp test code: 10322

## 2022-03-19 ENCOUNTER — Other Ambulatory Visit (HOSPITAL_COMMUNITY): Payer: Self-pay | Admitting: Cardiology

## 2022-03-20 ENCOUNTER — Encounter: Payer: Self-pay | Admitting: Oncology

## 2022-03-20 ENCOUNTER — Other Ambulatory Visit (HOSPITAL_COMMUNITY): Payer: Self-pay

## 2022-03-20 DIAGNOSIS — N184 Chronic kidney disease, stage 4 (severe): Secondary | ICD-10-CM | POA: Insufficient documentation

## 2022-03-25 ENCOUNTER — Encounter: Payer: Self-pay | Admitting: Oncology

## 2022-03-25 ENCOUNTER — Other Ambulatory Visit (HOSPITAL_COMMUNITY): Payer: Self-pay

## 2022-03-25 ENCOUNTER — Ambulatory Visit (INDEPENDENT_AMBULATORY_CARE_PROVIDER_SITE_OTHER): Payer: Medicare Other

## 2022-03-25 DIAGNOSIS — I420 Dilated cardiomyopathy: Secondary | ICD-10-CM | POA: Diagnosis not present

## 2022-03-25 DIAGNOSIS — I5022 Chronic systolic (congestive) heart failure: Secondary | ICD-10-CM

## 2022-03-25 LAB — CUP PACEART REMOTE DEVICE CHECK
Battery Remaining Longevity: 74 mo
Battery Remaining Percentage: 85 %
Battery Voltage: 2.98 V
Brady Statistic AP VP Percent: 54 %
Brady Statistic AP VS Percent: 1.9 %
Brady Statistic AS VP Percent: 36 %
Brady Statistic AS VS Percent: 3.1 %
Brady Statistic RA Percent Paced: 50 %
Date Time Interrogation Session: 20230807124245
HighPow Impedance: 82 Ohm
Implantable Lead Implant Date: 20220919
Implantable Lead Implant Date: 20220919
Implantable Lead Implant Date: 20220919
Implantable Lead Location: 753858
Implantable Lead Location: 753859
Implantable Lead Location: 753860
Implantable Pulse Generator Implant Date: 20220919
Lead Channel Impedance Value: 490 Ohm
Lead Channel Impedance Value: 550 Ohm
Lead Channel Impedance Value: 790 Ohm
Lead Channel Pacing Threshold Amplitude: 0.5 V
Lead Channel Pacing Threshold Amplitude: 0.75 V
Lead Channel Pacing Threshold Amplitude: 1 V
Lead Channel Pacing Threshold Pulse Width: 0.5 ms
Lead Channel Pacing Threshold Pulse Width: 0.5 ms
Lead Channel Pacing Threshold Pulse Width: 0.7 ms
Lead Channel Sensing Intrinsic Amplitude: 1.7 mV
Lead Channel Sensing Intrinsic Amplitude: 12 mV
Lead Channel Setting Pacing Amplitude: 1.75 V
Lead Channel Setting Pacing Amplitude: 2 V
Lead Channel Setting Pacing Amplitude: 2.5 V
Lead Channel Setting Pacing Pulse Width: 0.5 ms
Lead Channel Setting Pacing Pulse Width: 0.7 ms
Lead Channel Setting Sensing Sensitivity: 0.5 mV
Pulse Gen Serial Number: 111046561

## 2022-03-25 MED ORDER — TAMSULOSIN HCL 0.4 MG PO CAPS: 0.4000 mg | ORAL_CAPSULE | Freq: Every day | ORAL | 0 refills | Status: DC

## 2022-04-30 ENCOUNTER — Other Ambulatory Visit: Payer: Self-pay

## 2022-05-01 NOTE — Progress Notes (Signed)
Remote ICD transmission.   

## 2022-05-02 ENCOUNTER — Encounter: Payer: Self-pay | Admitting: Cardiology

## 2022-05-02 ENCOUNTER — Ambulatory Visit: Payer: Medicare Other | Attending: Cardiology | Admitting: Cardiology

## 2022-05-02 VITALS — BP 124/62 | HR 60 | Ht 69.0 in | Wt 177.2 lb

## 2022-05-02 DIAGNOSIS — Z720 Tobacco use: Secondary | ICD-10-CM | POA: Diagnosis present

## 2022-05-02 DIAGNOSIS — Z95810 Presence of automatic (implantable) cardiac defibrillator: Secondary | ICD-10-CM | POA: Insufficient documentation

## 2022-05-02 DIAGNOSIS — E782 Mixed hyperlipidemia: Secondary | ICD-10-CM | POA: Insufficient documentation

## 2022-05-02 DIAGNOSIS — I5020 Unspecified systolic (congestive) heart failure: Secondary | ICD-10-CM | POA: Insufficient documentation

## 2022-05-02 DIAGNOSIS — I420 Dilated cardiomyopathy: Secondary | ICD-10-CM | POA: Insufficient documentation

## 2022-05-02 DIAGNOSIS — I5022 Chronic systolic (congestive) heart failure: Secondary | ICD-10-CM | POA: Diagnosis present

## 2022-05-02 DIAGNOSIS — I10 Essential (primary) hypertension: Secondary | ICD-10-CM | POA: Insufficient documentation

## 2022-05-02 DIAGNOSIS — E7849 Other hyperlipidemia: Secondary | ICD-10-CM | POA: Insufficient documentation

## 2022-05-02 NOTE — Progress Notes (Signed)
Cardiology Office Note:    Date:  05/02/2022   ID:  Tommy Ward, DOB [birth year], MRN [id number]  PCP:  Serita Grammes, MD  Cardiologist:  Jenean Lindau, MD   Referring MD: Serita Grammes, MD    ASSESSMENT:    1. Dilated cardiomyopathy ([place])   2. Chronic systolic heart failure ([place])   3. ICD (implantable cardioverter-defibrillator) in place   4. Mixed hyperlipidemia   5. HFrEF (heart failure with reduced ejection fraction) ([place])   6. Familial hyperlipidemia   7. Essential hypertension   8. Tobacco use    PLAN:    In order of problems listed above:  Primary prevention stressed with the patient.  Importance of compliance with diet and medication stressed any vocalized understanding. Congestive heart failure: Stable and decompensated at this time.  We will have an echocardiogram for ejection fraction.  Salt intake issues and diet and weight checks were discussed and he agrees. We will hypertension: Blood pressure stable and diet was emphasized.  Lifestyle modification urged. Mixed dyslipidemia: On lipid-lowering medications.  He tells me that he has an appointment with his lipid specialist in a month and we will get lipid blood work done by them. Renal sufficiency: Followed by primary care. Intracardiac defibrillator: Followed by electrophysiology. Patient will be seen in follow-up appointment in 9 months or earlier if the patient has any concerns    Medication Adjustments/Labs and Tests Ordered: Current medicines are reviewed at length with the patient today.  Concerns regarding medicines are outlined above.  Orders Placed This Encounter  Procedures   ECHOCARDIOGRAM COMPLETE   No orders of the defined types were placed in this encounter.    No chief complaint on file.    History of Present Illness:    Tommy Ward is a 81 y.o. male.  Patient has past medical history of advanced ischemic cardiomyopathy, essential hypertension, mixed dyslipidemia and  renal insufficiency.  He has a defibrillator in place.  He denies any problems at this time.  He takes care of activities of daily living.  Fortunately a few weeks ago his quit smoking.  At the time of my evaluation, the patient is alert awake oriented and in no distress.  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[place]) 08/24/2019   Anemia due to stage 4 chronic kidney disease ([place]) 03/20/2022   Aortic valve disorder 07/08/2014   B12 deficiency 03/22/2018   Benign hypertension with chronic kidney disease, stage III ([place]) 05/20/2019   Benign hypertension with CKD (chronic kidney disease) stage IV ([place]) 05/20/2019   CAD (coronary artery disease) 11/23/2020   Cancer ([place]) 08/2008   prostate ca   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   Cataract 11/2013   bilateral   Chronic renal insufficiency, stage III (moderate) (HCC) 12/45/8099   Chronic systolic heart failure (HCC)    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 02/24/2019   Coronary arteriosclerosis 07/06/2012   Decreased cardiac ejection fraction 05/20/2019   Depressed left ventricular ejection fraction 12/05/2020   Dilated cardiomyopathy ([place]) 12/05/2020   Essential hypertension 07/06/2012   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   Gastroesophageal reflux disease without esophagitis 03/22/2018   GERD (gastroesophageal reflux disease)    HFrEF (heart failure with reduced ejection fraction) ([place]) 11/23/2020   Hypercalcemia of malignancy 02/16/2021   Hyperkalemia 08/24/2019   Hyperlipidemia    Hyperphosphatemia 11/25/2019   Hypertension  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   Left bundle branch block 07/06/2012   Malignant neoplasm of prostate ([place])    Malignant neoplasm of prostate ([place])    Metabolic bone disease 76/72/0947   Mixed hyperlipidemia 07/06/2012   Nonrheumatic mitral  valve regurgitation 12/05/2020   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   Olecranon bursitis of left elbow 06/25/2018   Pre-diabetes 12/05/2020   Pulmonary hypertension ([place]) 12/05/2020   S/P radiation therapy > 12 wks ago 2010   prostate CA   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[place])    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[place]) 12/05/2020   Tobacco use 12/05/2020   Vitamin D deficiency 08/24/2019    Past Surgical History:  Procedure Laterality Date   BIV ICD INSERTION CRT-D N/A 05/07/2021   Procedure: BIV ICD INSERTION CRT-D;  Surgeon: Vickie Epley, MD;  Location: [facility];  Service: Cardiovascular;  Laterality: N/A;   CHOLECYSTECTOMY  01/2003   lap chole   left hip repaired     RIGHT/LEFT HEART CATH AND CORONARY ANGIOGRAPHY N/A 12/08/2020   Procedure: RIGHT/LEFT HEART CATH AND CORONARY ANGIOGRAPHY;  Surgeon: Wellington Hampshire, MD;  Location: [facility];  Service: Cardiovascular;  Laterality: N/A;   TEE WITHOUT CARDIOVERSION N/A 01/26/2021   Procedure: TRANSESOPHAGEAL ECHOCARDIOGRAM (TEE);  Surgeon: Larey Dresser, MD;  Location[facility];  Service: Cardiovascular;  Laterality: N/A;    Current Medications: Current Meds  Medication Sig   amoxicillin (AMOXIL) 500 MG capsule Take 500 mg by mouth as needed (dental procedures).   aspirin EC 81 MG tablet Take 1 tablet (81 mg total) by mouth daily.   Bempedoic Acid (NEXLETOL) 180 MG TABS Take 1 tablet by mouth daily.   Cetirizine HCl 10 MG CAPS Take 1 capsule by mouth daily.   chlorhexidine (PERIDEX) 0.12 % solution Use as directed 15 mLs in the mouth or throat as needed (for infection in gums).   cholestyramine light (PREVALITE) 4 g packet Take 4 g by mouth daily.   dapagliflozin propanediol (FARXIGA) 10 MG TABS tablet Take 1 tablet (10 mg total) by mouth daily before breakfast.   denosumab (XGEVA) 120 MG/1.7ML SOLN injection Inject 120 mg into the skin every 30 (thirty) days.    digoxin (LANOXIN) 0.125 MG tablet Take 0.125 mg by mouth daily.   enzalutamide (XTANDI) 40 MG tablet Take 160 mg by mouth daily.   Evolocumab (REPATHA SURECLICK) 096 MG/ML SOAJ INJECT 1 DOSE UNDER THE SKIN EVERY 14 DAYS   losartan (COZAAR) 25 MG tablet Take 12.5 mg by mouth daily.   omeprazole (PRILOSEC) 40 MG capsule Take 40 mg by mouth daily.   pentoxifylline (TRENTAL) 400 MG CR tablet Take 400 mg by mouth 2 (two) times daily.   pregabalin (LYRICA) 75 MG capsule Take 75 mg by mouth 3 (three) times daily.   tamsulosin (FLOMAX) 0.4 MG CAPS capsule Take 1 capsule (0.4 mg total) by mouth daily.   torsemide (DEMADEX) 20 MG tablet Take 2 tablets (40 mg total) by mouth daily.   traZODone (DESYREL) 100 MG tablet Take 100 mg by mouth at bedtime.     Allergies:   Ezetimibe, Gabapentin, and Statins   Social History   Socioeconomic History   Marital status: Married    Spouse name: Not on file   Number of children: Not on file   Years of education: Not on file   Highest education level: Not on file  Occupational History   Not  on file  Tobacco Use   Smoking status: Every Day    Packs/day: 0.50    Years: 50.00    Total pack years: 25.00    Types: Cigarettes   Smokeless tobacco: Never  Vaping Use   Vaping Use: Never used  Substance and Sexual Activity   Alcohol use: Not Currently   Drug use: Never   Sexual activity: Not on file  Other Topics Concern   Not on file  Social History Narrative   Not on file   Social Determinants of Health   Financial Resource Strain: Not on file  Food Insecurity: Not on file  Transportation Needs: Not on file  Physical Activity: Not on file  Stress: Not on file  Social Connections: Not on file     Family History: The patient's family history includes Heart attack in his brother; Leukemia in his mother; Prostate cancer in his father. There is no history of Colon cancer, Rectal cancer, Stomach cancer, or Esophageal cancer.  ROS:   Please see the  history of present illness.    All other systems reviewed and are negative.  EKGs/Labs/Other Studies Reviewed:    The following studies were reviewed today: I discussed my findings with the patient at length   Recent Labs: 12/04/2021: ALT 11; Hemoglobin 14.2; Platelets 346 02/08/2022: BUN 33; Creatinine, Ser 2.44; Potassium 3.8; Sodium 139  Recent Lipid Panel    Component Value Date/Time   CHOL 210 (H) 12/10/2021 1101   CHOL 400 (H) 02/25/2019 0905   TRIG 341 (H) 12/10/2021 1101   HDL 35 (L) 12/10/2021 1101   HDL 51 02/25/2019 0905   CHOLHDL 6.0 12/10/2021 1101   VLDL 68 (H) 12/10/2021 1101   LDLCALC 107 (H) 12/10/2021 1101   LDLCALC 290 (H) 02/25/2019 0905    Physical Exam:    VS:  BP 124/62   Pulse 60   Ht '5\' 9"'$  (1.753 m)   Wt 177 lb 3.2 oz (80.4 kg)   SpO2 95%   BMI 26.17 kg/m     Wt Readings from Last 3 Encounters:  05/02/22 177 lb 3.2 oz (80.4 kg)  03/07/22 175 lb 11.2 oz (79.7 kg)  02/08/22 172 lb 12.8 oz (78.4 kg)     GEN: Patient is in no acute distress HEENT: Normal NECK: No JVD; No carotid bruits LYMPHATICS: No lymphadenopathy CARDIAC: Hear sounds regular, 2/6 systolic murmur at the apex. RESPIRATORY:  Clear to auscultation without rales, wheezing or rhonchi  ABDOMEN: Soft, non-tender, non-distended MUSCULOSKELETAL:  No edema; No deformity  SKIN: Warm and dry NEUROLOGIC:  Alert and oriented x 3 PSYCHIATRIC:  Normal affect   Signed, Jenean Lindau, MD  05/02/2022 9:40 AM    [place]

## 2022-05-02 NOTE — Patient Instructions (Signed)
Medication Instructions:  ?Your physician recommends that you continue on your current medications as directed. Please refer to the Current Medication list given to you today. ? ?*If you need a refill on your cardiac medications before your next appointment, please call your pharmacy* ? ? ?Lab Work: ?None ordered ?If you have labs (blood work) drawn today and your tests are completely normal, you will receive your results only by: ?MyChart Message (if you have MyChart) OR ?A paper copy in the mail ?If you have any lab test that is abnormal or we need to change your treatment, we will call you to review the results. ? ? ?Testing/Procedures: ?Your physician has requested that you have an echocardiogram. Echocardiography is a painless test that uses sound waves to create images of your heart. It provides your doctor with information about the size and shape of your heart and how well your heart?s chambers and valves are working. This procedure takes approximately one hour. There are no restrictions for this procedure. ? ? ? ?Follow-Up: ?At [facility], you and your health needs are our priority.  As part of our continuing mission to provide you with exceptional heart care, we have created designated Provider Care Teams.  These Care Teams include your primary Cardiologist (physician) and Advanced Practice Providers (APPs -  Physician Assistants and Nurse Practitioners) who all work together to provide you with the care you need, when you need it. ? ?We recommend signing up for the patient portal called "MyChart".  Sign up information is provided on this After Visit Summary.  MyChart is used to connect with patients for Virtual Visits (Telemedicine).  Patients are able to view lab/test results, encounter notes, upcoming appointments, etc.  Non-urgent messages can be sent to your provider as well.   ?To learn more about what you can do with MyChart, go to [email or web address].   ? ?Your next appointment:   ?9  month(s) ? ?The format for your next appointment:   ?In Person ? ?Provider:   ?Rajan Revankar, MD ? ? ?Other Instructions ?Echocardiogram ?An echocardiogram is a test that uses sound waves (ultrasound) to produce images of the heart. ?Images from an echocardiogram can provide important information about: ?Heart size and shape. ?The size and thickness and movement of your heart's walls. ?Heart muscle function and strength. ?Heart valve function or if you have stenosis. Stenosis is when the heart valves are too narrow. ?If blood is flowing backward through the heart valves (regurgitation). ?A tumor or infectious growth around the heart valves. ?Areas of heart muscle that are not working well because of poor blood flow or injury from a heart attack. ?Aneurysm detection. An aneurysm is a weak or damaged part of an artery wall. The wall bulges out from the normal force of blood pumping through the body. ?Tell a health care provider about: ?Any allergies you have. ?All medicines you are taking, including vitamins, herbs, eye drops, creams, and over-the-counter medicines. ?Any blood disorders you have. ?Any surgeries you have had. ?Any medical conditions you have. ?Whether you are pregnant or may be pregnant. ?What are the risks? ?Generally, this is a safe test. However, problems may occur, including an allergic reaction to dye (contrast) that may be used during the test. ?What happens before the test? ?No specific preparation is needed. You may eat and drink normally. ?What happens during the test? ?You will take off your clothes from the waist up and put on a hospital gown. ?Electrodes or electrocardiogram (ECG)patches may be placed on   your chest. The electrodes or patches are then connected to a device that monitors your heart rate and rhythm. ?You will lie down on a table for an ultrasound exam. A gel will be applied to your chest to help sound waves pass through your skin. ?A handheld device, called a transducer, will  be pressed against your chest and moved over your heart. The transducer produces sound waves that travel to your heart and bounce back (or "echo" back) to the transducer. These sound waves will be captured in real-time and changed into images of your heart that can be viewed on a video monitor. The images will be recorded on a computer and reviewed by your health care provider. ?You may be asked to change positions or hold your breath for a short time. This makes it easier to get different views or better views of your heart. ?In some cases, you may receive contrast through an IV in one of your veins. This can improve the quality of the pictures from your heart. ?The procedure may vary among health care providers and hospitals.   ?What can I expect after the test? ?You may return to your normal, everyday life, including diet, activities, and medicines, unless your health care provider tells you not to do that. ?Follow these instructions at home: ?It is up to you to get the results of your test. Ask your health care provider, or the department that is doing the test, when your results will be ready. ?Keep all follow-up visits. This is important. ?Summary ?An echocardiogram is a test that uses sound waves (ultrasound) to produce images of the heart. ?Images from an echocardiogram can provide important information about the size and shape of your heart, heart muscle function, heart valve function, and other possible heart problems. ?You do not need to do anything to prepare before this test. You may eat and drink normally. ?After the echocardiogram is completed, you may return to your normal, everyday life, unless your health care provider tells you not to do that. ?This information is not intended to replace advice given to you by your health care provider. Make sure you discuss any questions you have with your health care provider. ?Document Revised: 03/28/2020 Document Reviewed: 03/28/2020 ?Elsevier Patient  Education ? 2021 Elsevier Inc. ? ? ?

## 2022-05-08 ENCOUNTER — Other Ambulatory Visit (HOSPITAL_COMMUNITY): Payer: Self-pay | Admitting: Family Medicine

## 2022-05-09 ENCOUNTER — Ambulatory Visit (INDEPENDENT_AMBULATORY_CARE_PROVIDER_SITE_OTHER): Payer: Medicare Other | Admitting: Podiatry

## 2022-05-09 ENCOUNTER — Encounter: Payer: Self-pay | Admitting: Podiatry

## 2022-05-09 DIAGNOSIS — L03031 Cellulitis of right toe: Secondary | ICD-10-CM | POA: Diagnosis not present

## 2022-05-09 DIAGNOSIS — M79674 Pain in right toe(s): Secondary | ICD-10-CM

## 2022-05-09 DIAGNOSIS — M79675 Pain in left toe(s): Secondary | ICD-10-CM | POA: Diagnosis not present

## 2022-05-09 DIAGNOSIS — B351 Tinea unguium: Secondary | ICD-10-CM | POA: Diagnosis not present

## 2022-05-09 NOTE — Patient Instructions (Signed)
EPSOM SALT FOOT SOAK INSTRUCTIONS  Shopping List:  A. Plain epsom salt (not scented) B. Neosporin Cream C. 1-inch fabric band-aids   Place 1/4 cup of epsom salts in 2 quarts of warm tap water. IF YOU ARE DIABETIC, OR HAVE NEUROPATHY, CHECK THE TEMPERATURE OF THE WATER WITH YOUR ELBOW.   Submerge your foot/feet in the solution and soak for 10-15 minutes.      3.  Next, remove your foot/feet from solution, blot dry the affected area.    4.  Apply light amount of antibiotic cream/ointment and cover with fabric band-aid .  5.  This soak should be done once a day for 7 days. After 7 days, continue applying antibiotic cream to toe once daily.  6.  Monitor for any signs/symptoms of infection such as redness, swelling, odor, drainage, increased pain, or non-healing of digit.   7.  Please do not hesitate to call the office and speak to a Nurse or Doctor if you have questions.   8.  If you experience fever, chills, nightsweats, nausea or vomiting with worsening of digit/foot, please go to the emergency room.

## 2022-05-10 ENCOUNTER — Other Ambulatory Visit (HOSPITAL_COMMUNITY): Payer: Self-pay | Admitting: *Deleted

## 2022-05-10 MED ORDER — DIGOXIN 125 MCG PO TABS: 0.1250 mg | ORAL_TABLET | Freq: Every day | ORAL | 3 refills | Status: DC

## 2022-05-14 ENCOUNTER — Ambulatory Visit: Payer: Medicare Other | Attending: Cardiology

## 2022-05-14 DIAGNOSIS — I5022 Chronic systolic (congestive) heart failure: Secondary | ICD-10-CM | POA: Diagnosis present

## 2022-05-14 DIAGNOSIS — I420 Dilated cardiomyopathy: Secondary | ICD-10-CM | POA: Diagnosis present

## 2022-05-14 DIAGNOSIS — Z95810 Presence of automatic (implantable) cardiac defibrillator: Secondary | ICD-10-CM | POA: Diagnosis present

## 2022-05-15 LAB — ECHOCARDIOGRAM COMPLETE
AR max vel: 1.78 cm2
AV Area VTI: 1.64 cm2
AV Area mean vel: 1.75 cm2
AV Mean grad: 6 mmHg
AV Peak grad: 10.6 mmHg
Ao pk vel: 1.63 m/s
Area-P 1/2: 4.63 cm2
Calc EF: 27.4 %
MV M vel: 4.54 m/s
MV Peak grad: 82.4 mmHg
Radius: 0.6 cm
S' Lateral: 5.8 cm
Single Plane A2C EF: 30.9 %
Single Plane A4C EF: 25.5 %

## 2022-05-15 NOTE — Progress Notes (Unsigned)
  Subjective:  Patient ID: Tommy Ward, male    DOB: [birth year],  MRN: [id number]  Tommy Ward presents to clinic today for at risk foot care with history of peripheral neuropathy and painful elongated mycotic toenails 1-5 bilaterally which are tender when wearing enclosed shoe gear. Pain is relieved with periodic professional debridement.  New problem(s):  with chief concern of ingrown toenail(s) to right great toe. Onset was a few days ago.  Pain is described as tender.  Previous treatment(s) include none.  PCP is Tommy Grammes, MD , and last visit was August, 2023.  Allergies  Allergen Reactions   Ezetimibe Other (See Comments)    Myalgia   Gabapentin Itching and Other (See Comments)    Sore throat, breakouts   Statins Other (See Comments)    Myalgias (intolerance)    Review of Systems: Negative except as noted in the HPI.  Objective: No changes noted in today's physical examination. Tommy Ward is a pleasant 81 y.o. male in NAD. AAO x 3.  Vascular Examination: CFT <3 seconds b/l. DP/PT pulses faintly palpable b/l. Skin temperature gradient warm to warm b/l. No pain with calf compression. No ischemia or gangrene. No cyanosis or clubbing noted b/l. Pedal hair sparse. Varicosities present b/l.   Neurological Examination: Sensation grossly intact b/l with 10 gram monofilament. Vibratory sensation intact b/l. Pt has subjective symptoms of neuropathy.  Dermatological Examination: Pedal skin warm and supple b/l. Toenails 1-5 b/l thick, discolored, elongated with subungual debris and pain on dorsal palpation.   Evidence of subacute paronychia right great toe lateral border with onycholysis and erythema. No purulence, no nail border hypertrophy.  Musculoskeletal Examination: Muscle strength 5/5 to b/l LE. Limited joint ROM to the 1st MPJ b/l right >left.  Radiographs: None  Assessment/Plan: 1. Pain due to onychomycosis of toenails of both feet   2. Paronychia of  great toe, right    -Patient was evaluated and treated. All patient's and/or POA's questions/concerns answered on today's visit. -Examined patient. -Mycotic toenails 1-5 bilaterally were debrided in length and girth with sterile nail nippers and dremel without incident. -Offending nail border debrided and curretaged right great toe utilizing sterile nail nipper and currette. Border(s) cleansed with alcohol and triple antibiotic ointment applied. Dispensed written instructions for once daily epsom salt soaks for 7 days. Call office if there are any concerns. -Patient scheduled to see Dr. Altamese Ward in 2 weeks for follow up of right great toe. -Patient/POA to call should there be question/concern in the interim.   Return in about 2 weeks (around 05/23/2022).  Tommy Ward, DPM

## 2022-05-16 ENCOUNTER — Ambulatory Visit (HOSPITAL_COMMUNITY)
Admission: RE | Admit: 2022-05-16 | Discharge: 2022-05-16 | Disposition: A | Discharge status: Home / Self Care | Payer: Medicare Other | Source: Ambulatory Visit | Attending: Cardiology | Admitting: Cardiology

## 2022-05-16 VITALS — BP 120/70 | HR 97 | Wt 174.0 lb

## 2022-05-16 DIAGNOSIS — I130 Hypertensive heart and chronic kidney disease with heart failure and stage 1 through stage 4 chronic kidney disease, or unspecified chronic kidney disease: Secondary | ICD-10-CM | POA: Diagnosis not present

## 2022-05-16 DIAGNOSIS — I2510 Atherosclerotic heart disease of native coronary artery without angina pectoris: Secondary | ICD-10-CM | POA: Diagnosis present

## 2022-05-16 DIAGNOSIS — Z8546 Personal history of malignant neoplasm of prostate: Secondary | ICD-10-CM | POA: Diagnosis not present

## 2022-05-16 DIAGNOSIS — Z87891 Personal history of nicotine dependence: Secondary | ICD-10-CM | POA: Diagnosis not present

## 2022-05-16 DIAGNOSIS — Z923 Personal history of irradiation: Secondary | ICD-10-CM | POA: Diagnosis not present

## 2022-05-16 DIAGNOSIS — Z7984 Long term (current) use of oral hypoglycemic drugs: Secondary | ICD-10-CM | POA: Diagnosis not present

## 2022-05-16 DIAGNOSIS — I340 Nonrheumatic mitral (valve) insufficiency: Secondary | ICD-10-CM | POA: Diagnosis not present

## 2022-05-16 DIAGNOSIS — I255 Ischemic cardiomyopathy: Secondary | ICD-10-CM | POA: Insufficient documentation

## 2022-05-16 DIAGNOSIS — I493 Ventricular premature depolarization: Secondary | ICD-10-CM | POA: Insufficient documentation

## 2022-05-16 DIAGNOSIS — I5022 Chronic systolic (congestive) heart failure: Secondary | ICD-10-CM | POA: Insufficient documentation

## 2022-05-16 DIAGNOSIS — E875 Hyperkalemia: Secondary | ICD-10-CM

## 2022-05-16 DIAGNOSIS — Z79899 Other long term (current) drug therapy: Secondary | ICD-10-CM | POA: Insufficient documentation

## 2022-05-16 DIAGNOSIS — R188 Other ascites: Secondary | ICD-10-CM | POA: Diagnosis not present

## 2022-05-16 DIAGNOSIS — E782 Mixed hyperlipidemia: Secondary | ICD-10-CM | POA: Diagnosis not present

## 2022-05-16 DIAGNOSIS — N1832 Chronic kidney disease, stage 3b: Secondary | ICD-10-CM | POA: Insufficient documentation

## 2022-05-16 DIAGNOSIS — Z7982 Long term (current) use of aspirin: Secondary | ICD-10-CM | POA: Insufficient documentation

## 2022-05-16 DIAGNOSIS — C7951 Secondary malignant neoplasm of bone: Secondary | ICD-10-CM | POA: Diagnosis not present

## 2022-05-16 LAB — LIPID PANEL
Cholesterol: 210 mg/dL — ABNORMAL HIGH (ref 0–200)
HDL: 36 mg/dL — ABNORMAL LOW (ref >40)
LDL Cholesterol: 113 mg/dL — ABNORMAL HIGH (ref 0–99)
Total CHOL/HDL Ratio: 5.8 RATIO
Triglycerides: 305 mg/dL — ABNORMAL HIGH (ref <150)
VLDL: 61 mg/dL — ABNORMAL HIGH (ref 0–40)

## 2022-05-16 LAB — BASIC METABOLIC PANEL
Anion gap: 13 (ref 5–15)
BUN: 34 mg/dL — ABNORMAL HIGH (ref 8–23)
CO2: 26 mmol/L (ref 22–32)
Calcium: 8.9 mg/dL (ref 8.9–10.3)
Chloride: 101 mmol/L (ref 98–111)
Creatinine, Ser: 2.57 mg/dL — ABNORMAL HIGH (ref 0.61–1.24)
GFR, Estimated: 25 mL/min — ABNORMAL LOW (ref >60)
Glucose, Bld: 113 mg/dL — ABNORMAL HIGH (ref 70–99)
Potassium: 3.7 mmol/L (ref 3.5–5.1)
Sodium: 140 mmol/L (ref 135–145)

## 2022-05-16 LAB — BRAIN NATRIURETIC PEPTIDE: B Natriuretic Peptide: 569.5 pg/mL — ABNORMAL HIGH (ref 0.0–100.0)

## 2022-05-16 LAB — DIGOXIN LEVEL: Digoxin Level: 0.7 ng/mL — ABNORMAL LOW (ref 0.8–2.0)

## 2022-05-16 NOTE — Progress Notes (Signed)
PCP: Serita Grammes, MD Cardiology: Dr. [name] HF Cardiology: Dr. Aundra Dubin  81 y.o. with history of prostate cancer, CKD stage 3, CAD, and ischemic cardiomyopathy was referred by Dr. [name] for evaluation of CHF.  Patient developed gradually worsening exertional dyspnea starting early this year.  The shortness of breath has been especially bad for about 1.5 months.  He had an echo done in 4/22, showing EF < 20%, moderate-severe LV dilation, normal RV, severe MR.  LHC/RHC was done showing low cardiac index at 1.81 and occluded mid LAD.  No intervention.  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   TEE was done 6/22, showing EF < 20% with septal-lateral dyssynchrony, mildly decreased RV systolic function, moderate TR, moderate central MR with ERO 0.2 cm^2.  Echo in 7/22 similarly showed EF 20%, normal RV, moderate MR.  Patient had Medtronic CRT-D device implanted.  Echo in 1/23 showed EF <20% with moderate LV dilation, mildly decreased RV systolic function, normal IVC.   Echo in 9/23 showed EF 20-25%, mild MR.   Patient returns for followup of CHF.  Weight is stable. Symptomatically, he is feeling well.  No dyspnea walking on flat ground.  No chest pain.  No orthopnea/PND.  He has completely quit smoking since 7/23. Unfortunately, we are really not sure what meds he is taking.  I went over his meds with he and his wife as thoroughly as I could today and think I have a general idea about what he is doing.  He uses two different med lists, both of which are old.   ECG (personally reviewed): A-V sequential pacing with PVCs  Labs (5/22): K 3.4, creatinine 1.7 => 2.15, AST 123 => 189, ALT 322 =>190, tbili 1.7, Hgb 12, LDL 38 Labs (6/22): Viral hepatitis serologies negative Labs (8/22): K 4.2, creatinine 1.87 Labs (10/22): K 3.7, creatinine 2 Labs (11/22): LDL 159 Labs (12/22): K 3.7, creatinine 2 Labs (4/23): K  4.4, creatinine 2.7, LFTs normal Labs (7/23): K 4.4, creatinine 2.35  PMH:  1. Hyperlipidemia 2. HTN 3. CKD stage 3 4. 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  Treated with radiation and currently on denosumab.   5. Cholecystectomy 6. CAD: LHC in 4/22 with 80% proximal RCA stenosis (nondominant), totally occluded mLAD with collaterals, 60% D1.  No intervention.  7. Chronic systolic CHF: Ischemic cardiomyopathy.  Medtronic CRT-D.  - Echo (4/22) with EF < 20%, moderate-severe LV dilation, normal RV, severe LAE, severe MR.  - RHC (4/22): mean RA 3, PA 39/10, mean PCWP 12, CI 1.81 - Echo (6/22): EF < 20% with septal-lateral dyssynchrony, mildly decreased RV systolic function, moderate TR, moderate central MR with ERO 0.2 cm^2.  - Echo (7/22): EF <20%, mild LVH, normal RV, moderate MR.  - Echo (1/23: EF <20% with moderate LV dilation, mildly decreased RV systolic function, normal IVC, mild-moderate MR. 8. Mitral regurgitation: Severe on 4/22 echo.  - TEE (6/22) with moderate central MR with ERO 0.2 cm^2 (functional, annular dilatation).  - Echo (1/23) with mild-moderate MR.  - Echo (9/23): EF 20-25%, mild MR.  9. Elevated LFTs: Viral hepatitis labs negative.  RUQ [place] (6/22) was not suggestive of cirrhosis, ascites noted.  10. Hypercalcemia: Due to Ca supplementation in setting of renal dysfunction.  11. Chronic LBBB  Social History   Socioeconomic History   Marital status: Married    Spouse name: Not on file   Number of children: Not on file  Years of education: Not on file   Highest education level: Not on file  Occupational History   Not on file  Tobacco Use   Smoking status: Every Day    Packs/day: 0.50    Years: 50.00    Total pack years: 25.00    Types: Cigarettes   Smokeless tobacco: Never  Vaping Use   Vaping Use: Never used  Substance and Sexual Activity   Alcohol use: Not Currently   Drug use: Never   Sexual activity: Not on file  Other Topics Concern   Not on file   Social History Narrative   Not on file   Social Determinants of Health   Financial Resource Strain: Not on file  Food Insecurity: Not on file  Transportation Needs: Not on file  Physical Activity: Not on file  Stress: Not on file  Social Connections: Not on file  Intimate Partner Violence: Not on file   Family History  Problem Relation Age of Onset   Leukemia Mother    Prostate cancer Father    Heart attack Brother    Colon cancer Neg Hx    Rectal cancer Neg Hx    Stomach cancer Neg Hx    Esophageal cancer Neg Hx    ROS: All systems reviewed and negative except as per HPI.   Current Outpatient Medications  Medication Sig Dispense Refill   amoxicillin (AMOXIL) 500 MG capsule Take 500 mg by mouth as needed (dental procedures).     aspirin EC 81 MG tablet Take 1 tablet (81 mg total) by mouth daily. 90 tablet 3   Bempedoic Acid (NEXLETOL) 180 MG TABS Take 1 tablet by mouth daily. 90 tablet 3   Cetirizine HCl 10 MG CAPS Take 1 capsule by mouth daily.     chlorhexidine (PERIDEX) 0.12 % solution Use as directed 15 mLs in the mouth or throat as needed (for infection in gums).     cholestyramine light (PREVALITE) 4 g packet Take 4 g by mouth daily.     dapagliflozin propanediol (FARXIGA) 10 MG TABS tablet Take 1 tablet (10 mg total) by mouth daily before breakfast. 90 tablet 3   denosumab (XGEVA) 120 MG/1.7ML SOLN injection Inject 120 mg into the skin every 30 (thirty) days.     digoxin (LANOXIN) 0.125 MG tablet Take 1 tablet (0.125 mg total) by mouth daily. 90 tablet 3   enzalutamide (XTANDI) 40 MG tablet Take 160 mg by mouth daily.     Evolocumab (REPATHA SURECLICK) 332 MG/ML SOAJ INJECT 1 DOSE UNDER THE SKIN EVERY 14 DAYS 6 mL 3   losartan (COZAAR) 25 MG tablet Take 12.5 mg by mouth daily.     omeprazole (PRILOSEC) 40 MG capsule Take 40 mg by mouth daily.     pentoxifylline (TRENTAL) 400 MG CR tablet Take 400 mg by mouth 2 (two) times daily.     pregabalin (LYRICA) 75 MG capsule  Take 75 mg by mouth 3 (three) times daily.     spironolactone (ALDACTONE) 25 MG tablet Take 25 mg by mouth daily.     tamsulosin (FLOMAX) 0.4 MG CAPS capsule Take 1 capsule (0.4 mg total) by mouth daily. 30 capsule 0   torsemide (DEMADEX) 20 MG tablet TAKE 2 TABLETS BY MOUTH 2 TIMES DAILY 200 tablet 4   traZODone (DESYREL) 100 MG tablet Take 100 mg by mouth at bedtime.     No current facility-administered medications for this encounter.   BP 120/70   Pulse 97   Wt  78.9 kg (174 lb)   SpO2 97%   BMI 25.70 kg/m  General: NAD Neck: No JVD, no thyromegaly or thyroid nodule.  Lungs: Clear to auscultation bilaterally with normal respiratory effort. CV: Nondisplaced PMI.  Heart regular S1/S2, no S3/S4, no murmur.  No peripheral edema.  No carotid bruit.  Normal pedal pulses.  Abdomen: Soft, nontender, no hepatosplenomegaly, no distention.  Skin: Intact without lesions or rashes.  Neurologic: Alert and oriented x 3.  Psych: Normal affect. Extremities: No clubbing or cyanosis.  HEENT: Normal.   Assessment/Plan: 1. CAD: Occluded mid LAD and 80% proximal stenosis in nondominant RCA in 4/22.  No intervention.  No chest pain.  - Continue ASA 81 mg daily.  - Continue Repatha and Nexletol (intolerant of statins and Zetia).  Check lipids today, goal LDL < 55. He says he is taking these meds.  2. Chronic systolic CHF: Ischemic cardiomyopathy.  Echo in 4/22 with EF < 20%, moderate-severe LV dilation, normal RV, severe LAE, severe MR.  RHC in 4/22 with CI 1.8.  TEE in 6/22 with EF <20%, dyssynchrony, mildly decreased RV systolic function.  Echo in 7/22 with EF < 20%, moderate MR.  Medtronic CRT-D device placed.  Echo in 1/23 showed EF < 20%, mild RV dysfunction.  Echo in 9/23 showed EF 20-25%, mild MR. No change to echo since CRT but he has felt better symptomatically.  NYHA class I-II, he is not volume overloaded on exam.  GDMT has been limited by renal function (CKD stage 3b) but also by confusion over  what he is actually taking.  - Continue Toprol XL 25 mg daily.  - Continue Farxiga 10 mg daily.     - Continue spironolactone 25 mg daily.   - Continue digoxin 0.125 qod.  Check digoxin level today.    - Off Entresto and losartan with CKD and soft BP.     - Continue torsemide 40 mg bid, BMET today.  3. Mitral regurgitation: Severe on 5/22 TTE. However, TEE in 6/22 showed moderate functional central MR.   Mild-moderate MR on echo 1/23 post-CRT, mild MR on 9/23 echo.  4. Elevated LFTs: Viral hepatitis labs negative.  Abdominal US did not show cirrhosis but did show ascites.  Etiology uncertain, ?congestive hepatopathy.  - Repeat LFTs in 4/23 were normal.  5. CKD stage 3: Follow closely with medication titration, BMET today.  6. Smoking: He quit in 7/23, congratulated.   I think that he is currently taking the above meds.  He will go home and review his bottles with his wife.  I will schedule him a followup with the HF pharmacist in the next week or so.  They are to bring all his medication bottles with him to review. I will also refer him to paramedicine to help with med compliance. Followup with APP in 1 month to continue med titration once we know what he is actually taking.   Loralie Champagne 05/16/2022

## 2022-05-16 NOTE — Patient Instructions (Signed)
Please make sure you are taking these medications.  Farxiga 10 mg daily.  Digoxin 0.125 mcg every other day  Toprol XL 25 mg daily  Spironolactone 25 mg daily   Labs done today, your results will be available in MyChart, we will contact you for abnormal readings.  Please follow up with our heart failure pharmacist in 3 weeks  PLEASE BRING ALL YOUR MEDICATIONS TO [place]  Your physician recommends that you schedule a follow-up appointment in: 6 weeks  If you have any questions or concerns before your next appointment please send [place] a message through [place] or call our office at [phone].    TO LEAVE A MESSAGE FOR THE NURSE SELECT OPTION 2, PLEASE LEAVE A MESSAGE INCLUDING: YOUR NAME DATE OF BIRTH CALL BACK NUMBER REASON FOR CALL**this is important as we prioritize the call backs  YOU WILL RECEIVE A CALL BACK THE SAME DAY AS LONG AS YOU CALL BEFORE 4:00 PM  At the [facility], you and your health needs are our priority. As part of our continuing mission to provide you with exceptional heart care, we have created designated Provider Care Teams. These Care Teams include your primary Cardiologist (physician) and Advanced Practice Providers (APPs- Physician Assistants and Nurse Practitioners) who all work together to provide you with the care you need, when you need it.   You may see any of the following providers on your designated Care Team at your next follow up: Dr Glori Bickers Dr Loralie Champagne Dr. Roxana Hires, NP Lyda Jester, [place] [facility], [place] Forestine Na, NP Audry Riles, PharmD   Please be sure to bring in all your medications bottles to every appointment.

## 2022-05-21 ENCOUNTER — Telehealth (HOSPITAL_COMMUNITY): Payer: Self-pay | Admitting: Licensed Clinical Social Worker

## 2022-05-21 NOTE — Telephone Encounter (Signed)
CSW attempted to contact pt to discuss provider referral to [facility].  CSW unable to reach- left VM requesting return call  Tommy Ward, [facility] Desk#: [phone] Cell#: [phone]

## 2022-05-22 ENCOUNTER — Telehealth (HOSPITAL_COMMUNITY): Payer: Self-pay | Admitting: Licensed Clinical Social Worker

## 2022-05-22 ENCOUNTER — Telehealth (HOSPITAL_COMMUNITY): Payer: Self-pay

## 2022-05-22 NOTE — Telephone Encounter (Signed)
H&V Care Navigation CSW Progress Note  Clinical Social Worker consulted to speak with pt regarding paramedicine referral.  CSW spoke with pt wife who is agreeable to pt being enrolled in program.  [place]: No Food Insecurity (05/22/2022)  Housing: Low Risk  (05/22/2022)  Transportation Needs: No Transportation Needs (05/22/2022)  Utilities: Not At Risk (05/22/2022)  Financial Resource Strain: Low Risk  (05/22/2022)  Tobacco Use: High Risk (05/09/2022)     Paramedicine Initial Assessment:  Housing:  In what kind of housing do you live? House/apt/trailer/shelter? house  Do you live with anyone? Pt wife  Are you currently worried about losing your housing? no  Social:  What is your current marital status? married  Do you have family or friends who live locally? Pt wife has family who lives in [place], [place], and [place].  No other family support close by.  Barbie Banner is a neighbor who pt wife reports they have understanding to help eachother if needed.  Income:  What is your current source of income? Both get retirement income  No financial concerns expressed  Insurance:  Are you currently insured? yes  Do you have prescription coverage? yes   Transportation:  Do you have transportation to your medical appointments? Yes have two cars- no concerns with getting to appts   Daily Health Needs: Do you have a working scale at home? yes  How do you manage your medications at home? Wife is managing his medication- is somewhat confused because of written instructions showing different instructions  Do you ever take your medications differently than prescribed? Not intentionally  Do you have issues affording your medications? Some medications are very expensive but they are managing with grants etc.  Do you have any concerns with mobility at home? Has a hard time getting around due to his neuropathy- has a cane but doesn't use often  Do you have a  PCP? Serita Grammes, MD  Do you currently use tobacco products or have recently quit? Former user- quit 2-3 months ago  Are there any additional barriers you see to getting the care you need? Not at this time  CSW will continue to follow through paramedicine program and assist as needed.  Jorge Ny, LCSW Clinical Social Worker [facility] Desk#: [phone] Cell#: [phone]

## 2022-05-22 NOTE — Telephone Encounter (Signed)
Spoke to Tommy Ward wife about paramedicine home visit and they agreed to home visit on Monday 10/9 at 10:00. I will follow up with home visit at that time. Call complete.   [name], [place] 05/22/2022

## 2022-05-23 NOTE — Progress Notes (Addendum)
Advanced Heart Failure Clinic Note   PCP: Serita Grammes, MD Cardiology: Dr. [name] HF Cardiology: Dr. Aundra Dubin  HPI:  81 y.o. with history of prostate cancer, CKD stage 3, CAD, and ischemic cardiomyopathy was referred by Dr. [name] for evaluation of CHF.  Patient developed gradually worsening exertional dyspnea. The shortness of breath has been especially bad for about 1.5 months.  He had an echo done in 11/2020, showing EF < 20%, moderate-severe LV dilation, normal RV, severe MR.  LHC/RHC was done showing low cardiac index at 1.81 and occluded mid LAD.  No intervention.  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    TEE was done 01/2021, showing EF < 20% with septal-lateral dyssynchrony, mildly decreased RV systolic function, moderate TR, moderate central MR with ERO 0.2 cm^2.  Echo in 02/2021 similarly showed EF 20%, normal RV, moderate MR.  Patient had Medtronic CRT-D device implanted.  Echo in 08/2021 showed EF <20% with moderate LV dilation, mildly decreased RV systolic function, normal IVC.    Echo in 04/2022 showed EF 20-25%, mild MR.    Patient returned to [facility] for followup of CHF 05/16/22.  Weight was stable. Symptomatically, he was feeling well.  No dyspnea walking on flat ground.  No chest pain.  No orthopnea/PND.  He had completely quit smoking since 02/2022. Unfortunately, it was unclear what medications he was taking.    Today he returns to HF clinic for pharmacist medication titration. Since last visit he has established with HF paramedicine Nira Conn). They have done great work making sure his pill box is correct. At visit yesterday, it was noted that he was no longer orthostatic, which was an improvement from the previous week. This is likely because losartan was taken out of his pill box per Dr. Claris Gladden orders as this had been stopped previously for hypotension (patient was accidentally  still taking it). Overall he is doing well today. Brought in pill bottles but not pill box. I have updated medication list to reflect what he is currently taking. Removed losartan and Xgeva. Added metoprolol succinate 25 mg daily per Dr. Claris Gladden note from last visit (patient is already taking this, just not documented on medication list). Updated torsemide to 60 mg daily - medication list stated 40 mg BID, but patient has been taking 60 mg daily and urinating better on this dose. His labs have been stable on this dose. Additional discrepancy with medication list was digoxin prescription is 0.125 mg daily on medication list, but 0.125 mg every other day in Dr. Claris Gladden notes. Upon further review, digoxin dose was decreased previously due to elevated level. The normal digoxin level 3 weeks ago (0.7 ng/mL) was on the 1 tablet every other day dose per patient. Will change back to 1 tablet every other day (see below) and patient will update pill box. Will also send message to HF paramedicine so they are aware of change.     HF Medications: Metoprolol succinate 25 mg daily Spironolactone 25 mg daily Farxiga 10 mg daily Digoxin 0.125 mg every other day - taking daily Torsemide 40 mg BID - taking 60 mg daily   Has the patient been experiencing any side effects to the medications prescribed?  Complains of "internal shakiness". Could potentially be related to digoxin if level is too high. Has already taken digoxin today. Will repeat level at next visit at decreased dose.   Does the patient have any problems obtaining  medications due to transportation or finances?  [employer] Medicare. Has [employer] PAP for Farxiga.   Understanding of regimen: fair Understanding of indications: fair Potential of compliance: fair Patient understands to avoid NSAIDs. Patient understands to avoid decongestants.    Pertinent Lab Values: 06/04/22: Serum creatinine 2.4, BUN 33, Potassium 4.0, Sodium 135  Vital Signs: Weight: 173.0  lbs (last clinic weight: 174 lbs) Blood pressure: 112/74  Heart rate: 69   Assessment/Plan: 1. CAD: Occluded mid LAD and 80% proximal stenosis in nondominant RCA in 11/2020.  No intervention.  No chest pain.  - Continue ASA 81 mg daily.  - Continue Repatha and Nexletol (intolerant of statins and Zetia).  LDL 113 on 05/16/22, goal LDL < 55. He says he is taking these meds. Has follow up with lipid clinic 06/18/22.  2. Chronic systolic CHF: Ischemic cardiomyopathy.  Echo in 11/2020 with EF < 20%, moderate-severe LV dilation, normal RV, severe LAE, severe MR.  RHC in 11/2020 with CI 1.8.  TEE in 01/2021 with EF <20%, dyssynchrony, mildly decreased RV systolic function.  Echo in 02/2021 with EF < 20%, moderate MR.  Medtronic CRT-D device placed.  Echo in 08/2021 showed EF < 20%, mild RV dysfunction.  Echo in 04/2022 showed EF 20-25%, mild MR. No change to echo since CRT but he has felt better symptomatically.  - NYHA class I-II, he is not volume overloaded on exam.  GDMT has been limited by renal function (CKD stage 3b) but also by confusion over what he is actually taking.  - Continue torsemide 60 mg daily. Updated medication list to reflect how patient is taking.  - Continue metoprolol XL 25 mg daily.  - Continue Farxiga 10 mg daily.     - Continue spironolactone 25 mg daily.   - Decrease digoxin to 0.125 mg every other day (incorrectly listed as 0.125 mg daily in medication list).  Digoxin level good at 0.7 ng/mL on 05/16/22 on the 0.125 every other day dose. Patient has had elevated levels in the past on the 0.125 mg daily dose. Per Dr. Claris Gladden most recent note, he should be on 0.125 every other day. Updated medication list and will inform HF paramedicine of change.  - Off Entresto and losartan with CKD and soft BP.  Removed losartan from medication list.    3. Mitral regurgitation: Severe on 12/2020 TTE. However, TEE in 01/2021 showed moderate functional central MR.   Mild-moderate MR on echo 08/2021 post-CRT,  mild MR on 04/2022 echo.  4. Elevated LFTs: Viral hepatitis labs negative.  Abdominal US did not show cirrhosis but did show ascites.  Etiology uncertain, ?congestive hepatopathy.  - Repeat LFTs in 11/2021 were normal.  5. CKD stage 3: Follow closely with medication titration, BMET 06/04/22 at his baseline, Scr 2.4. 6. Smoking: He quit in 02/2022, congratulated.     Follow up 3 weeks with [facility].    Audry Riles, PharmD, BCPS, BCCP, CPP Heart Failure Clinic Pharmacist [phone]

## 2022-05-24 ENCOUNTER — Ambulatory Visit: Payer: Medicare Other | Admitting: Podiatry

## 2022-05-28 ENCOUNTER — Other Ambulatory Visit (HOSPITAL_COMMUNITY): Payer: Self-pay | Admitting: Surgery

## 2022-05-28 ENCOUNTER — Telehealth (HOSPITAL_COMMUNITY): Payer: Self-pay | Admitting: Surgery

## 2022-05-28 DIAGNOSIS — E782 Mixed hyperlipidemia: Secondary | ICD-10-CM

## 2022-05-28 NOTE — Telephone Encounter (Signed)
-----   Message from Larey Dresser, MD sent at 05/16/2022  2:24 PM EDT ----- He is supposed to be on both Repatha and bempedoic acid (Nexlicet).  LDL surprisingly high.  Would forward this to lipid clinic and also check to make sure he is really taking these meds.

## 2022-05-30 ENCOUNTER — Other Ambulatory Visit (HOSPITAL_COMMUNITY): Payer: Self-pay

## 2022-05-30 NOTE — Progress Notes (Signed)
Paramedicine Encounter    Patient ID: Tommy Ward, male    DOB: 04-12-1941, 81 y.o.   MRN: [id number]   Arrived for initial home visit for [facility] who was alert and oriented reporting to be feeling okay during visit. He reports that over the last several weeks he has been experiencing some light headedness, fatigue and "shakes" when standing finding it hard to catch his balance at times. He denied shortness of breath, weight gain or swelling. He reports he is taking his medications and feels he is taking them correctly.   We spent a large amount of our time reviewing his medications and confirming notes and doses and there were several discrepancies.   -He has been taking '50mg'$  of spironolactone every morning- when instructions are to take '25mg'$ . This was adjusted and placed correctly in his pill box by me. (First correct dose will be on 05/31/22)  -He was told by Dr. Aundra Dubin to not take 12.'5mg'$  of Losartan per his note on 9/28 however he has been taking it daily- but it was removed from his pill box and instructed to hold until confirmed. (Last dose 05/29/22)   -He has been taking '60mg'$  of torsemide by his own choosing because he reports the '40mg'$  in the morning isn't enough and he doesn't urinate much during the day when he takes '40mg'$  but he elected to take '60mg'$  in the morning and says this helps him pee more and feels like it helps keep his fluid off. I will report this off to Dr. Aundra Dubin for his advice for further.   -Metoprolol '25mg'$  is on his AVS and Dr. Claris Gladden note for 05/16/22 but is not in his medications list in Epic under an encounter. He is taking this every morning.  He will need a new RX sent to [name] as his is nearing expiration.  -He wants confirmation from Dr. Aundra Dubin that he can take the following medications that his dentist wants him to take: Tocholpherol  Pentoxifylline   (I will forward all to triage and have them let Dr. Aundra Dubin advise)    Once all meds were reviewed  and confirmed and placed in pill box accoridngly, I obtained vitals and assessment.   WT- 174.4lbs BP SITTING- 122/74 BP STANDING- 90/58  HR SITTING- 70 HR STANDING- 82  02- 97% RR- 16  Lungs clear on assessment, no lower leg edema noted. No abdominal distention or JVD noted.   He and his wife report that they feel much better after our visit and feels going forward paramedicine will be a huge help. We agreed to a follow up visit on Wednesday before he goes to Pharmacy Clinic on Thursday as I won't be with him for this visit but will ensure Audry Riles is aware of same.    We did discuss his medication costs and issues- he reports having trouble paying for the following:  Repatha- he reports he was on a grant but funds ran out.   Nexletol- paying $198/mo -says this was on PAN grant but funds ran out.   Wilder Glade- says this was on a grant but funds ran out.   Xtandi $300/mo- he reports was on a grant but funds ran out.   I will forward to Kath[facility] for guidance on same.     Refills needed to be called in: [name]: -Trazadone -Nexletol   Mail Order: -Omeprazole   I will call in same and follow up in the home next week. We reviewed all upcoming appointments  and confirmed same. We discussed diet, exercise and salt/fluid restrictions. I advised him to check his weight daily- I will provide a weight chart. I also encouraged him to check his BP over the next week until our next visit to see how it trends now that his medications are corrected. He agreed and wife did also. Home visit complete. I will see him on Wednesday 10/18.   [name], [place] 05/31/2022      Patient Care Team: Serita Grammes, MD as PCP - General (Family Medicine) Debara Pickett Nadean Corwin, MD as PCP - Cardiology (Cardiology) Vickie Epley, MD as PCP - Electrophysiology (Cardiology) Marice Potter, MD as Consulting Physician (Oncology) Azzie Glatter, DDS as Referring  Physician (Oral Surgery) Joie Bimler, MD as Consulting Physician (Urology)  Patient Active Problem List   Diagnosis Date Noted   Anemia due to stage 4 chronic kidney disease ([name]) 03/20/2022   Hypercalcemia of malignancy 63/78/5885   Chronic systolic heart failure ([place])    Abnormal stress test 12/05/2020   Depressed left ventricular ejection fraction 12/05/2020   Nonrheumatic mitral valve regurgitation 12/05/2020   Pre-diabetes 12/05/2020   Pulmonary hypertension ([place]) 12/05/2020   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[place]) 12/05/2020   Tobacco use 12/05/2020   Dilated cardiomyopathy ([place]) 12/05/2020   HFrEF (heart failure with reduced ejection fraction) ([place]) 11/23/2020   CAD (coronary artery disease) 11/23/2020   GERD (gastroesophageal reflux disease)    Hyperlipidemia    Hypertension    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[place]) 06/02/2020   Malignant neoplasm of prostate ([place]) 06/02/2020   AKI (acute kidney injury) ([place]) 11/25/2019   Hyperphosphatemia 11/25/2019   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[place]) 08/24/2019   Hyperkalemia 02/77/4128   Metabolic bone disease 78/67/6720   Vitamin D deficiency 08/24/2019   Benign hypertension with chronic kidney disease, stage III ([place]) 05/20/2019   Decreased cardiac ejection fraction 05/20/2019   Benign hypertension with CKD (chronic kidney disease) stage IV ([place]) 05/20/2019   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 02/24/2019   Olecranon bursitis of left elbow 06/25/2018   B12 deficiency 03/22/2018   Gastroesophageal reflux disease without esophagitis 03/22/2018   Chronic renal insufficiency, stage III (moderate) (HCC) 11/27/2015   Aortic valve disorder 07/08/2014   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   Cataract 11/2013   Essential hypertension 07/06/2012   Coronary arteriosclerosis 07/06/2012   Hypertensive  heart disease without congestive heart failure 07/06/2012   Left bundle branch block 07/06/2012   Mixed hyperlipidemia 07/06/2012   Cancer ([place]) 08/2008   S/P radiation therapy > 12 wks ago 2010    Current Outpatient Medications:    amoxicillin (AMOXIL) 500 MG capsule, Take 500 mg by mouth as needed (dental procedures)., Disp: , Rfl:    aspirin EC 81 MG tablet, Take 1 tablet (81 mg total) by mouth daily., Disp: 90 tablet, Rfl: 3   Bempedoic Acid (NEXLETOL) 180 MG TABS, Take 1 tablet by mouth daily., Disp: 90 tablet, Rfl: 3   Cetirizine HCl 10 MG CAPS, Take 1 capsule by mouth daily., Disp: , Rfl:    chlorhexidine (PERIDEX) 0.12 % solution, Use as directed 15 mLs in the mouth or throat as needed (for infection in gums)., Disp: , Rfl:    cholestyramine light (PREVALITE) 4 g packet, Take 4 g by mouth daily., Disp: , Rfl:    dapagliflozin propanediol (FARXIGA) 10 MG TABS tablet, Take 1 tablet (  10 mg total) by mouth daily before breakfast., Disp: 90 tablet, Rfl: 3   denosumab (XGEVA) 120 MG/1.7ML SOLN injection, Inject 120 mg into the skin every 30 (thirty) days., Disp: , Rfl:    digoxin (LANOXIN) 0.125 MG tablet, Take 1 tablet (0.125 mg total) by mouth daily., Disp: 90 tablet, Rfl: 3   enzalutamide (XTANDI) 40 MG tablet, Take 160 mg by mouth daily., Disp: , Rfl:    Evolocumab (REPATHA SURECLICK) 147 MG/ML SOAJ, INJECT 1 DOSE UNDER THE SKIN EVERY 14 DAYS, Disp: 6 mL, Rfl: 3   losartan (COZAAR) 25 MG tablet, Take 12.5 mg by mouth daily., Disp: , Rfl:    omeprazole (PRILOSEC) 40 MG capsule, Take 40 mg by mouth daily., Disp: , Rfl:    pentoxifylline (TRENTAL) 400 MG CR tablet, Take 400 mg by mouth 2 (two) times daily., Disp: , Rfl:    pregabalin (LYRICA) 75 MG capsule, Take 75 mg by mouth 3 (three) times daily., Disp: , Rfl:    spironolactone (ALDACTONE) 25 MG tablet, Take 25 mg by mouth daily., Disp: , Rfl:    tamsulosin (FLOMAX) 0.4 MG CAPS capsule, Take 1 capsule (0.4 mg total) by mouth daily.,  Disp: 30 capsule, Rfl: 0   torsemide (DEMADEX) 20 MG tablet, TAKE 2 TABLETS BY MOUTH 2 TIMES DAILY, Disp: 200 tablet, Rfl: 4   traZODone (DESYREL) 100 MG tablet, Take 100 mg by mouth at bedtime., Disp: , Rfl:  Allergies  Allergen Reactions   Ezetimibe Other (See Comments)    Myalgia   Gabapentin Itching and Other (See Comments)    Sore throat, breakouts   Statins Other (See Comments)    Myalgias (intolerance)     Social History   Socioeconomic History   Marital status: Married    Spouse name: Not on file   Number of children: Not on file   Years of education: Not on file   Highest education level: Not on file  Occupational History   Not on file  Tobacco Use   Smoking status: Every Day    Packs/day: 0.50    Years: 50.00    Total pack years: 25.00    Types: Cigarettes   Smokeless tobacco: Never  Vaping Use   Vaping Use: Never used  Substance and Sexual Activity   Alcohol use: Not Currently   Drug use: Never   Sexual activity: Not on file  Other Topics Concern   Not on file  Social History Narrative   Not on file   Social Determinants of Health   Financial Resource Strain: Low Risk  (05/22/2022)   Overall Financial Resource Strain (CARDIA)    Difficulty of Paying Living Expenses: Not very hard  Food Insecurity: No Food Insecurity (05/22/2022)   Hunger Vital Sign    Worried About Running Out of Food in the Last Year: Never true    Ran Out of Food in the Last Year: Never true  Transportation Needs: No Transportation Needs (05/22/2022)   PRAPARE - Hydrologist (Medical): No    Lack of Transportation (Non-Medical): No  Physical Activity: Not on file  Stress: Not on file  Social Connections: Not on file  Intimate Partner Violence: Not on file    Physical Exam      Future Appointments  Date Time Provider [place]  06/04/2022 10:45 AM Yevonne Pax, DPM [facility]  06/06/2022 11:00 AM [facility] PHARMACY  [facility] None  06/06/2022  1:15 PM [facility] None  06/07/2022  9:15 AM Marice Potter, MD [facility] None  06/18/2022  8:15 AM Pixie Casino, MD [facility] None  06/24/2022  3:40 PM [facility] DEVICE REMOTES [facility]  06/26/2022 11:00 AM [facility] PA/NP [facility] None  08/22/2022 10:00 AM Marzetta Board, DPM [facility]  09/23/2022  3:40 PM [facility] DEVICE REMOTES [facility]  12/23/2022  3:40 PM [facility] DEVICE REMOTES [facility]  03/24/2023  3:40 PM [facility] DEVICE REMOTES [facility]  06/23/2023  3:40 PM [facility] DEVICE REMOTES [facility]  09/22/2023  3:40 PM [facility] DEVICE REMOTES [facility]     ACTION: Home visit completed

## 2022-05-31 ENCOUNTER — Telehealth (HOSPITAL_COMMUNITY): Payer: Self-pay

## 2022-05-31 ENCOUNTER — Encounter (HOSPITAL_COMMUNITY): Payer: Self-pay

## 2022-05-31 NOTE — Telephone Encounter (Signed)
Had first paramedicine visit with Mr. Tommy Ward and discussed his medication costs and issues- he reports having trouble paying for the following:  Repatha- he reports he was on a grant but funds ran out. (Couldn't confirm cost OOP)  Nexletol- paying $198/mo -says this was on PAN grant but funds ran out.   Wilder Glade- says this was on a grant but funds ran out. (Couldn't confirm cost OOP)  Xtandi $300/mo- he reports was on a grant but funds ran out.     I will forward to [facility] for guidance on same and confirmation.   [name], [place] 05/31/2022

## 2022-05-31 NOTE — Telephone Encounter (Signed)
Hello,  The only medication on this list that we handle here at the AHF clinic is the [place]. He has patient assistance through [employer] and has been approved through 08/18/22. I can confirm that last delivery was made on 09/12 at 943am in/at mailbox.

## 2022-05-31 NOTE — Progress Notes (Signed)
Per Dr. Aundra Dubin:  Take spironolactone 25.   Do not take Losartan.    Take toprol xl 25 as per my note.   He can keep torsemide at 60 but needs bmet next week.     ACTION: Home visit completed

## 2022-06-04 ENCOUNTER — Ambulatory Visit (INDEPENDENT_AMBULATORY_CARE_PROVIDER_SITE_OTHER): Payer: Medicare Other | Admitting: Podiatry

## 2022-06-04 ENCOUNTER — Inpatient Hospital Stay: Payer: Medicare Other | Attending: Oncology

## 2022-06-04 DIAGNOSIS — M79674 Pain in right toe(s): Secondary | ICD-10-CM | POA: Diagnosis not present

## 2022-06-04 DIAGNOSIS — C7951 Secondary malignant neoplasm of bone: Secondary | ICD-10-CM | POA: Insufficient documentation

## 2022-06-04 DIAGNOSIS — B351 Tinea unguium: Secondary | ICD-10-CM

## 2022-06-04 DIAGNOSIS — C61 Malignant neoplasm of prostate: Secondary | ICD-10-CM | POA: Diagnosis present

## 2022-06-04 DIAGNOSIS — L03031 Cellulitis of right toe: Secondary | ICD-10-CM | POA: Diagnosis not present

## 2022-06-04 DIAGNOSIS — M79675 Pain in left toe(s): Secondary | ICD-10-CM

## 2022-06-04 LAB — BASIC METABOLIC PANEL
BUN: 33 — AB (ref 4–21)
CO2: 26 — AB (ref 13–22)
Chloride: 101 (ref 99–108)
Creatinine: 2.4 — AB (ref 0.6–1.3)
Glucose: 114
Potassium: 4 mEq/L (ref 3.5–5.1)
Sodium: 135 — AB (ref 137–147)

## 2022-06-04 LAB — COMPREHENSIVE METABOLIC PANEL
Albumin: 4.5 (ref 3.5–5.0)
Calcium: 9.4 (ref 8.7–10.7)

## 2022-06-04 LAB — HEPATIC FUNCTION PANEL
ALT: 15 U/L (ref 10–40)
AST: 24 (ref 14–40)
Alkaline Phosphatase: 53 (ref 25–125)
Bilirubin, Total: 1

## 2022-06-04 LAB — CBC AND DIFFERENTIAL
HCT: 43 (ref 41–53)
Hemoglobin: 14.5 (ref 13.5–17.5)
Neutrophils Absolute: 3.84
Platelets: 299 10*3/uL (ref 150–400)
WBC: 6.5

## 2022-06-04 LAB — PSA: Prostatic Specific Antigen: <0.01 ng/mL (ref 0.00–4.00)

## 2022-06-04 LAB — CBC: RBC: 4.89 (ref 3.87–5.11)

## 2022-06-04 NOTE — Progress Notes (Signed)
  Subjective:  Patient ID: Tommy Ward, male    DOB: 1940/09/18,  MRN: [id number]  Chief Complaint  Patient presents with   Follow-up    2 week follow up for right great toe-paronychia.     81 y.o. male presents with the above complaint.  He previously saw Dr. Elisha Ponder approximately 2 weeks ago.  There was concern for paronychia of the right hallux lateral toenail.  He says that he has been doing Epsom salt soaks as previously instructed by Dr. Elisha Ponder.  He is dates that a portion of the right hallux nail fell off after a few soaks and there is only a sliver of the nail remaining at this time.  He denies any redness swelling or drainage recently says it has improved from prior.  Denies pain at the right hallux nail at this time.  Objective:  Physical Exam: warm, good capillary refill, DP and PT pulses 2/4 bilateral nail exam attention directed to the right hallux nail borders there is no erythema edema or drainage present.  No evidence of paronychia at this time.  No pain with palpation on the remaining medial border of the right hallux nail. DP pulses palpable, PT pulses palpable, and protective sensation absent Left Foot: Nails of appropriate length and without pain Right Foot: Nails of appropriate length and without pain  Assessment:   1. Paronychia of great toe, right   2. Pain due to onychomycosis of toenails of both feet      Plan:  Patient was evaluated and treated and all questions answered.  #2-week follow-up of right great toe paronychia #Nail dystrophy secondary to onychomycosis -Discussed with the patient that the right hallux nail and nail borders now appear free of any infection.  There is a sliver of nail present at the medial aspect of the right hallux nail bed.  This is nontender with palpation.  There is no surrounding erythema edema or drainage. -I used a nail nippers to resect a portion of the remaining nail.  There is still a small amount at the proximal  medial aspect of the nailbed. -I discussed that the patient should continue to follow-up with Dr. Adah Perl for routine ongoing foot care but there is no concern from my standpoint as to the area currently. -Use Epsom salt soaks and antibiotic ointment as needed if there is any swelling redness or drainage and call to come in for appointment with myself for possible ingrown procedure as needed.  Return for With Dr. Elisha Ponder for routine care.         Everitt Amber, DPM [facility]

## 2022-06-04 NOTE — Progress Notes (Deleted)
  Subjective:  Patient ID: Tommy Ward, male    DOB: 03/16/1941,  MRN: [id number]  Chief Complaint  Patient presents with   Follow-up    2 week follow up for right great toe-paronychia.     81 y.o. male presents with the above complaint.  Objective:  Physical Exam: warm, good capillary refill, DP and PT pulses *** bilateral nail exam {:[id number]} {pod dm exam:23670::"DP pulses palpable","PT pulses palpable","protective sensation intact"} Left Foot:  Pain with palpation of nails due to elongation and dystrophic growth. Hyperkeratotic lesion present *** Right Foot: Pain with palpation of nails due to elongation and dystrophic growth. Hyperkeratotic lesion present ***  Assessment:  No diagnosis found.   Plan:  Patient was evaluated and treated and all questions answered.  #2-week follow-up of right great toe paronychia #Nail dystrophy secondary to onychomycosis -Discussed with the patient that the right hallux nail and nail borders now appear free of any infection.  There is a sliver of nail present at the medial aspect of the right hallux nail bed.  This is nontender with palpation.  There is no surrounding erythema edema or drainage. -I used a nail nippers to resect a portion of the remaining nail.  There is still a small amount at the proximal medial aspect of the nailbed. -I discussed that the patient should continue to follow-up with Dr. Adah Perl for routine ongoing foot care but there is no concern from my standpoint as to the area currently. -Use Epsom salt soaks and antibiotic ointment as needed if there is any swelling redness or drainage and call to come in for appointment with myself for possible ingrown procedure as needed.  Return for With Dr. Adah Perl for routine care.         Everitt Amber, DPM [facility]

## 2022-06-05 ENCOUNTER — Telehealth (HOSPITAL_COMMUNITY): Payer: Self-pay | Admitting: Family Medicine

## 2022-06-05 ENCOUNTER — Other Ambulatory Visit (HOSPITAL_COMMUNITY): Payer: Self-pay

## 2022-06-05 ENCOUNTER — Other Ambulatory Visit: Payer: Self-pay | Admitting: Internal Medicine

## 2022-06-05 DIAGNOSIS — I2510 Atherosclerotic heart disease of native coronary artery without angina pectoris: Secondary | ICD-10-CM

## 2022-06-05 DIAGNOSIS — E7849 Other hyperlipidemia: Secondary | ICD-10-CM

## 2022-06-05 MED ORDER — REPATHA SURECLICK 140 MG/ML ~~LOC~~ SOAJ: SUBCUTANEOUS | 3 refills | Status: DC

## 2022-06-05 NOTE — Progress Notes (Signed)
Paramedicine Encounter    Patient ID: Tommy Ward, male    DOB: [birth year], 81 y.o.   MRN: [id number]  Arrived for home visit for Tommy Ward who reports he is doing okay today he reports he is still feeling dizzy at times and "shaky" when standing some times. He denied any chest pain, shortness of breath, swelling, weight gain. Lungs clear on assessment. No lower leg edema. Weight is 169lbs today in the home.   I obtained vitals sitting and standing and no orthostatic changes noted this week which is an improvement from last week.   BP- 102/58 HR- 70 RR- 16 O2- 96%  I reviewed medications and confirmed same filling pill box for one week.   He is no approved per Dr. Aundra Dubin to take the two medications his dentist prescribed:  pentoxifylline 400 mg, tocopherol 450 mg  He his taking the pentoxifylline morning and evening starting today but has not received the tocopherol yet.   Tommy Ward will see Tommy Ward in clinic tomorrow I will send her notes from last two visits due to my absence tomorrow.   Labs were drawn yesterday at cancer center. Aundra Dubin wanted a repeat BMET this week and it was done at cancer center)   Tommy Ward reports overall okay with no complaints. I plan to see him next Tuesday in the home. He agreed with plan.   Home visit complete.     MED COST CONCERNS: I plan to reach out about the following med cost concerns with these doctors.   [facility]- Dr. Bobby Rumpf CANCER [facility]- Dr. 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[place]) 03/20/2022   Hypercalcemia of malignancy 01/75/1025   Chronic systolic heart failure ([place])    Abnormal stress test 12/05/2020   Depressed left ventricular ejection fraction 12/05/2020   Nonrheumatic mitral valve regurgitation 12/05/2020   Pre-diabetes 12/05/2020   Pulmonary hypertension ([place]) 12/05/2020   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[place]) 12/05/2020   Tobacco use 12/05/2020   Dilated cardiomyopathy ([place]) 12/05/2020   HFrEF (heart failure with reduced ejection fraction) ([place]) 11/23/2020   CAD (coronary artery disease) 11/23/2020   GERD (gastroesophageal reflux disease)    Hyperlipidemia    Hypertension    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[place]) 06/02/2020   Malignant neoplasm of prostate ([place]) 06/02/2020   AKI (acute kidney injury) ([place]) 11/25/2019   Hyperphosphatemia 11/25/2019   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[place]) 08/24/2019   Hyperkalemia 85/27/7824   Metabolic bone disease 23/53/6144   Vitamin D deficiency 08/24/2019   Benign hypertension with chronic kidney disease, stage III ([place]) 05/20/2019   Decreased cardiac ejection fraction 05/20/2019   Benign hypertension with CKD (chronic kidney disease) stage IV ([place]) 05/20/2019   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 02/24/2019   Olecranon bursitis of left elbow 06/25/2018   B12 deficiency 03/22/2018   Gastroesophageal reflux disease without esophagitis 03/22/2018   Chronic renal insufficiency, stage III (moderate) ([place]) 11/27/2015  Aortic valve disorder 07/08/2014   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   Cataract 11/2013   Essential hypertension 07/06/2012   Coronary arteriosclerosis 07/06/2012   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   Left bundle branch block 07/06/2012   Mixed hyperlipidemia 07/06/2012    Cancer ([place]) 08/2008   S/P radiation therapy > 12 wks ago 2010    Current Outpatient Medications:    amoxicillin (AMOXIL) 500 MG capsule, Take 500 mg by mouth as needed (dental procedures)., Disp: , Rfl:    aspirin EC 81 MG tablet, Take 1 tablet (81 mg total) by mouth daily., Disp: 90 tablet, Rfl: 3   Bempedoic Acid (NEXLETOL) 180 MG TABS, Take 1 tablet by mouth daily., Disp: 90 tablet, Rfl: 3   Cetirizine HCl 10 MG CAPS, Take 1 capsule by mouth daily., Disp: , Rfl:    chlorhexidine (PERIDEX) 0.12 % solution, Use as directed 15 mLs in the mouth or throat as needed (for infection in gums). (Patient not taking: Reported on 05/30/2022), Disp: , Rfl:    cholestyramine light (PREVALITE) 4 g packet, Take 4 g by mouth daily., Disp: , Rfl:    dapagliflozin propanediol (FARXIGA) 10 MG TABS tablet, Take 1 tablet (10 mg total) by mouth daily before breakfast., Disp: 90 tablet, Rfl: 3   denosumab (XGEVA) 120 MG/1.7ML SOLN injection, Inject 120 mg into the skin every 30 (thirty) days. (Patient not taking: Reported on 05/30/2022), Disp: , Rfl:    digoxin (LANOXIN) 0.125 MG tablet, Take 1 tablet (0.125 mg total) by mouth daily., Disp: 90 tablet, Rfl: 3   enzalutamide (XTANDI) 40 MG tablet, Take 160 mg by mouth daily., Disp: , Rfl:    Evolocumab (REPATHA SURECLICK) 220 MG/ML SOAJ, INJECT 1 DOSE UNDER THE SKIN EVERY 14 DAYS, Disp: 6 mL, Rfl: 3   losartan (COZAAR) 25 MG tablet, Take 12.5 mg by mouth daily., Disp: , Rfl:    omeprazole (PRILOSEC) 40 MG capsule, Take 40 mg by mouth daily., Disp: , Rfl:    pentoxifylline (TRENTAL) 400 MG CR tablet, Take 400 mg by mouth 2 (two) times daily., Disp: , Rfl:    pregabalin (LYRICA) 75 MG capsule, Take 75 mg by mouth 3 (three) times daily., Disp: , Rfl:    spironolactone (ALDACTONE) 25 MG tablet, Take 25 mg by mouth daily., Disp: , Rfl:    tamsulosin (FLOMAX) 0.4 MG CAPS capsule, Take 1 capsule (0.4 mg total) by mouth daily., Disp: 30 capsule, Rfl: 0   torsemide  (DEMADEX) 20 MG tablet, TAKE 2 TABLETS BY MOUTH 2 TIMES DAILY, Disp: 200 tablet, Rfl: 4   traZODone (DESYREL) 100 MG tablet, Take 100 mg by mouth at bedtime., Disp: , Rfl:  Allergies  Allergen Reactions   Ezetimibe Other (See Comments)    Myalgia   Gabapentin Itching and Other (See Comments)    Sore throat, breakouts   Statins Other (See Comments)    Myalgias (intolerance)     Social History   Socioeconomic History   Marital status: Married    Spouse name: Not on file   Number of children: Not on file   Years of education: Not on file   Highest education level: Not on file  Occupational History   Not on file  Tobacco Use   Smoking status: Every Day    Packs/day: 0.50    Years: 50.00    Total pack years: 25.00    Types: Cigarettes   Smokeless tobacco: Never  Vaping Use   Vaping Use:  Never used  Substance and Sexual Activity   Alcohol use: Not Currently   Drug use: Never   Sexual activity: Not on file  Other Topics Concern   Not on file  Social History Narrative   Not on file   Social Determinants of Health   Financial Resource Strain: Low Risk  (05/22/2022)   Overall Financial Resource Strain (CARDIA)    Difficulty of Paying Living Expenses: Not very hard  Food Insecurity: No Food Insecurity (05/22/2022)   Hunger Vital Sign    Worried About Running Out of Food in the Last Year: Never true    Ran Out of Food in the Last Year: Never true  Transportation Needs: No Transportation Needs (05/22/2022)   PRAPARE - Hydrologist (Medical): No    Lack of Transportation (Non-Medical): No  Physical Activity: Not on file  Stress: Not on file  Social Connections: Not on file  Intimate Partner Violence: Not on file    Physical Exam      Future Appointments  Date Time Provider [place]  06/06/2022 11:00 AM [facility] PHARMACY [facility] None  06/07/2022  9:15 AM Marice Potter, MD [facility] None  06/18/2022  8:15 AM Pixie Casino,  MD [facility] None  06/24/2022  3:40 PM [facility] DEVICE REMOTES [facility]  06/26/2022 11:00 AM [facility] PA/NP [facility] None  08/22/2022 10:00 AM Marzetta Board, DPM [facility]  09/23/2022  3:40 PM [facility] DEVICE REMOTES [facility]  12/23/2022  3:40 PM [facility] DEVICE REMOTES [facility]  03/24/2023  3:40 PM [facility] DEVICE REMOTES [facility]  06/23/2023  3:40 PM [facility] DEVICE REMOTES [facility]  09/22/2023  3:40 PM [facility] DEVICE REMOTES [facility]     ACTION: Home visit completed

## 2022-06-05 NOTE — Telephone Encounter (Signed)
Did you receive fax on 10/12 regarding medications, pentoxifylline 400 mg, tocopherol 450 mg, Dr Hardie Shackleton want to make sure no contradictions . Please advise thanks

## 2022-06-05 NOTE — Telephone Encounter (Signed)
I have not received a fax.

## 2022-06-06 ENCOUNTER — Other Ambulatory Visit: Payer: Medicare Other

## 2022-06-06 ENCOUNTER — Ambulatory Visit (HOSPITAL_COMMUNITY)
Admission: RE | Admit: 2022-06-06 | Discharge: 2022-06-06 | Disposition: A | Discharge status: Home / Self Care | Payer: Medicare Other | Source: Ambulatory Visit | Attending: Cardiology | Admitting: Cardiology

## 2022-06-06 VITALS — BP 112/74 | HR 69 | Wt 173.0 lb

## 2022-06-06 DIAGNOSIS — I255 Ischemic cardiomyopathy: Secondary | ICD-10-CM | POA: Insufficient documentation

## 2022-06-06 DIAGNOSIS — I5022 Chronic systolic (congestive) heart failure: Secondary | ICD-10-CM | POA: Diagnosis not present

## 2022-06-06 DIAGNOSIS — I340 Nonrheumatic mitral (valve) insufficiency: Secondary | ICD-10-CM | POA: Diagnosis not present

## 2022-06-06 DIAGNOSIS — I2510 Atherosclerotic heart disease of native coronary artery without angina pectoris: Secondary | ICD-10-CM | POA: Insufficient documentation

## 2022-06-06 DIAGNOSIS — N1832 Chronic kidney disease, stage 3b: Secondary | ICD-10-CM | POA: Insufficient documentation

## 2022-06-06 DIAGNOSIS — Z7982 Long term (current) use of aspirin: Secondary | ICD-10-CM | POA: Insufficient documentation

## 2022-06-06 DIAGNOSIS — Z87891 Personal history of nicotine dependence: Secondary | ICD-10-CM | POA: Insufficient documentation

## 2022-06-06 MED ORDER — TORSEMIDE 20 MG PO TABS: 60.0000 mg | ORAL_TABLET | Freq: Every day | ORAL | 3 refills | Status: DC

## 2022-06-06 MED ORDER — DIGOXIN 125 MCG PO TABS: 0.1250 mg | ORAL_TABLET | ORAL | 3 refills | Status: DC

## 2022-06-06 MED ORDER — METOPROLOL SUCCINATE ER 25 MG PO TB24: 25.0000 mg | ORAL_TABLET | Freq: Every day | ORAL | 3 refills | Status: DC

## 2022-06-06 NOTE — Patient Instructions (Signed)
It was a pleasure seeing you today!  MEDICATIONS: -We are changing your medications today -Decrease digoxin to 0.125 mg (1 tablet) every other day. We will repeat digoxin level on visit 11/8. Please do not take digoxin the morning of your appointment so we can get an accurate level.  -Call if you have questions about your medications.   NEXT APPOINTMENT: Return to clinic in 3 weeks with [facility].  In general, to take care of your heart failure: -Limit your fluid intake to 2 Liters (half-gallon) per day.   -Limit your salt intake to ideally 2-3 grams (2000-3000 mg) per day. -Weigh yourself daily and record, and bring that "weight diary" to your next appointment.  (Weight gain of 2-3 pounds in 1 day typically means fluid weight.) -The medications for your heart are to help your heart and help you live longer.   -Please contact us before stopping any of your heart medications.  Call the clinic at [phone] with questions or to reschedule future appointments.

## 2022-06-07 ENCOUNTER — Ambulatory Visit: Payer: Medicare Other | Admitting: Oncology

## 2022-06-10 NOTE — Progress Notes (Signed)
Tommy  [address] [place],  Tommy [place] [phone]  Clinic Day:  06/11/2022  Referring physician: Serita Grammes, MD   HISTORY OF PRESENT ILLNESS:  The patient is an 81 y.o. 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  He is supposed to be on Trelstar as well, but has not had a Trelstar shot at his urologist's office in numerous months.  He comes in today for routine followup.  Since his last visit, the patient has been doing well.  He denies having any systemic symptoms which concern him for overt progression of his metastatic prostate cancer.  PHYSICAL EXAM:  Blood pressure (!) 122/58, pulse 72, temperature 97.6 F (36.4 C), resp. rate 16, height '5\' 9"'$  (1.753 m), weight 171 lb 8 oz (77.8 kg), SpO2 97 %. Wt Readings from Last 3 Encounters:  06/11/22 171 lb 8 oz (77.8 kg)  06/06/22 173 lb (78.5 kg)  06/05/22 169 lb (76.7 kg)   Body mass index is 25.33 kg/m. Performance status (ECOG): 1 - Symptomatic but completely ambulatory Physical Exam Constitutional:      Appearance: Normal appearance. He is not ill-appearing.  HENT:     Mouth/Throat:     Mouth: Mucous membranes are moist.     Pharynx: Oropharynx is clear. No oropharyngeal exudate or posterior oropharyngeal erythema.  Cardiovascular:     Rate and Rhythm: Normal rate and regular rhythm.     Heart sounds: No murmur heard.    No friction rub. No gallop.  Pulmonary:     Effort: Pulmonary effort is normal. No respiratory distress.     Breath sounds: Normal breath sounds. No wheezing, rhonchi or rales.  Abdominal:     General: Bowel sounds are normal. There is no distension.     Palpations: Abdomen is soft. There is no mass.     Tenderness: There is no abdominal tenderness.  Musculoskeletal:        General: No swelling.     Right lower leg: No edema.     Left lower leg: No edema.  Lymphadenopathy:     Cervical: No cervical  adenopathy.     Upper Body:     Right upper body: No supraclavicular or axillary adenopathy.     Left upper body: No supraclavicular or axillary adenopathy.     Lower Body: No right inguinal adenopathy. No left inguinal adenopathy.  Skin:    General: Skin is warm.     Coloration: Skin is not jaundiced.     Findings: No lesion or rash.  Neurological:     General: No focal deficit present.     Mental Status: He is alert and oriented to person, place, and time. Mental status is at baseline.  Psychiatric:        Mood and Affect: Mood normal.        Behavior: Behavior normal.        Thought Content: Thought content normal.     LABS:    Latest Reference Range & Units 06/04/22 11:28  Prostatic Specific Antigen 0.00 - 4.00 ng/mL <0.01    ASSESSMENT & PLAN:  Assessment/Plan:  An 81 y.o. male with metastatic prostate cancer.  I am very pleased as his PSA remains at an undetectable level.  This for now, he will continue taking his enzalutamide daily as it appears to be effective as a single agent in controlling his disease.  Clinically, the patient is doing well.  I will  see him back in 4 months for repeat clinical assessment.  The patient understands all the plans discussed today and is in agreement with them.    Niels Cranshaw Macarthur Critchley, MD

## 2022-06-11 ENCOUNTER — Other Ambulatory Visit (HOSPITAL_COMMUNITY): Payer: Self-pay

## 2022-06-11 ENCOUNTER — Inpatient Hospital Stay (INDEPENDENT_AMBULATORY_CARE_PROVIDER_SITE_OTHER): Payer: Medicare Other | Admitting: Oncology

## 2022-06-11 ENCOUNTER — Other Ambulatory Visit: Payer: Self-pay | Admitting: Oncology

## 2022-06-11 VITALS — BP 122/58 | HR 72 | Temp 97.6°F | Resp 16 | Ht 69.0 in | Wt 171.5 lb

## 2022-06-11 DIAGNOSIS — C61 Malignant neoplasm of prostate: Secondary | ICD-10-CM

## 2022-06-11 NOTE — Progress Notes (Signed)
Paramedicine Encounter    Patient ID: Tommy [place], male    DOB: Sep 01, 1940, 80 y.o.   MRN: [id number]   Arrived for home visit for [facility] for a medication review and pill box fill. He was seen by cancer doctor earlier today and vitals were obtained by their office. All were within normal limits.   I reviewed all meds and filled pill box for one week.   He is still having issues with cost with Nexletol and Repatha.   Nexletol is coming from [place] $95.36   Repatha is coming from [place]    I spoke to [name] and updated his grant information to the pharmacy tech:  CARD NO.  [id number]     CARD STATUS  Active     BIN  [id number]     PCN  [id number]     PC GROUP  [id number]   -Jimmy (Pharmacy Tech) with [name] is working on processing [employer] and will reach out to me and the patient once they figure out if it is active and able to use to cover cost of Repatha.I will follow up on same.   I will review same with [name] about Nexletol.   Appointments reviewed and confirmed. I will see [place] in one week.  Home visit complete.       Patient Care Team: Serita Grammes, MD as PCP - General (Family Medicine) Debara Pickett Nadean Corwin, MD as PCP - Cardiology (Cardiology) Vickie Epley, MD as PCP - Electrophysiology (Cardiology) Marice Potter, MD as Consulting Physician (Oncology) Azzie Glatter, DDS as Referring Physician (Oral Surgery) Joie Bimler, MD as Consulting Physician (Urology)  Patient Active Problem List   Diagnosis Date Noted   Anemia due to stage 4 chronic kidney disease ([place]) 03/20/2022   Hypercalcemia of malignancy 76/81/1572   Chronic systolic heart failure ([place])    Abnormal stress test 12/05/2020   Depressed left ventricular ejection fraction 12/05/2020   Nonrheumatic mitral valve regurgitation 12/05/2020   Pre-diabetes 12/05/2020   Pulmonary hypertension ([place]) 12/05/2020   Nonrheumatic tricuspid valve regurgitation  12/05/2020   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[place]) 12/05/2020   Tobacco use 12/05/2020   Dilated cardiomyopathy ([place]) 12/05/2020   HFrEF (heart failure with reduced ejection fraction) ([place]) 11/23/2020   CAD (coronary artery disease) 11/23/2020   GERD (gastroesophageal reflux disease)    Hyperlipidemia    Hypertension    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[place]) 06/02/2020   Malignant neoplasm of prostate ([place]) 06/02/2020   AKI (acute kidney injury) ([place]) 11/25/2019   Hyperphosphatemia 11/25/2019   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[place]) 08/24/2019   Hyperkalemia 62/10/5595   Metabolic bone disease 41/63/8453   Vitamin D deficiency 08/24/2019   Benign hypertension with chronic kidney disease, stage III ([place]) 05/20/2019   Decreased cardiac ejection fraction 05/20/2019   Benign hypertension with CKD (chronic kidney disease) stage IV ([place]) 05/20/2019   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 02/24/2019   Olecranon bursitis of left elbow 06/25/2018   B12 deficiency 03/22/2018   Gastroesophageal reflux disease without esophagitis 03/22/2018   Chronic renal insufficiency, stage III (moderate) ([facility]) 11/27/2015   Aortic valve disorder 07/08/2014   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   Cataract 11/2013   Essential hypertension 07/06/2012   Coronary arteriosclerosis 07/06/2012   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   Left bundle branch block 07/06/2012   Mixed hyperlipidemia 07/06/2012   Cancer ([place])  08/2008   S/P radiation therapy > 12 wks ago 2010    Current Outpatient Medications:    amoxicillin (AMOXIL) 500 MG capsule, Take 500 mg by mouth as needed (dental procedures)., Disp: , Rfl:    aspirin EC 81 MG tablet, Take 1 tablet (81 mg total) by mouth daily., Disp: 90 tablet, Rfl: 3   Bempedoic Acid (NEXLETOL) 180 MG TABS, Take 1 tablet by mouth daily., Disp: 90 tablet, Rfl: 3    Cetirizine HCl 10 MG CAPS, Take 1 capsule by mouth daily., Disp: , Rfl:    chlorhexidine (PERIDEX) 0.12 % solution, Use as directed 15 mLs in the mouth or throat as needed (for infection in gums)., Disp: , Rfl:    cholestyramine light (PREVALITE) 4 g packet, Take 4 g by mouth daily., Disp: , Rfl:    dapagliflozin propanediol (FARXIGA) 10 MG TABS tablet, Take 1 tablet (10 mg total) by mouth daily before breakfast., Disp: 90 tablet, Rfl: 3   digoxin (LANOXIN) 0.125 MG tablet, Take 1 tablet (0.125 mg total) by mouth every other day., Disp: 45 tablet, Rfl: 3   enzalutamide (XTANDI) 40 MG tablet, Take 160 mg by mouth daily., Disp: , Rfl:    Evolocumab (REPATHA SURECLICK) 161 MG/ML SOAJ, INJECT 1 DOSE UNDER THE SKIN EVERY 14 DAYS, Disp: 6 mL, Rfl: 3   metoprolol succinate (TOPROL XL) 25 MG 24 hr tablet, Take 1 tablet (25 mg total) by mouth daily., Disp: 90 tablet, Rfl: 3   omeprazole (PRILOSEC) 40 MG capsule, Take 40 mg by mouth daily., Disp: , Rfl:    pentoxifylline (TRENTAL) 400 MG CR tablet, Take 400 mg by mouth 2 (two) times daily., Disp: , Rfl:    pregabalin (LYRICA) 75 MG capsule, Take 75 mg by mouth 3 (three) times daily., Disp: , Rfl:    spironolactone (ALDACTONE) 25 MG tablet, Take 25 mg by mouth daily., Disp: , Rfl:    tamsulosin (FLOMAX) 0.4 MG CAPS capsule, Take 1 capsule (0.4 mg total) by mouth daily., Disp: 30 capsule, Rfl: 0   torsemide (DEMADEX) 20 MG tablet, Take 3 tablets (60 mg total) by mouth daily., Disp: 270 tablet, Rfl: 3   traZODone (DESYREL) 100 MG tablet, Take 50 mg by mouth at bedtime., Disp: , Rfl:  Allergies  Allergen Reactions   Ezetimibe Other (See Comments)    Myalgia   Gabapentin Itching and Other (See Comments)    Sore throat, breakouts   Statins Other (See Comments)    Myalgias (intolerance)     Social History   Socioeconomic History   Marital status: Married    Spouse name: Not on file   Number of children: Not on file   Years of education: Not on file    Highest education level: Not on file  Occupational History   Not on file  Tobacco Use   Smoking status: Every Day    Packs/day: 0.50    Years: 50.00    Total pack years: 25.00    Types: Cigarettes   Smokeless tobacco: Never  Vaping Use   Vaping Use: Never used  Substance and Sexual Activity   Alcohol use: Not Currently   Drug use: Never   Sexual activity: Not on file  Other Topics Concern   Not on file  Social History Narrative   Not on file   Social Determinants of Health   Financial Resource Strain: Low Risk  (05/22/2022)   Overall Financial Resource Strain (CARDIA)    Difficulty of Paying Living Expenses:  Not very hard  Food Insecurity: No Food Insecurity (05/22/2022)   Hunger Vital Sign    Worried About Running Out of Food in the Last Year: Never true    Ran Out of Food in the Last Year: Never true  Transportation Needs: No Transportation Needs (05/22/2022)   PRAPARE - Hydrologist (Medical): No    Lack of Transportation (Non-Medical): No  Physical Activity: Not on file  Stress: Not on file  Social Connections: Not on file  Intimate Partner Violence: Not on file    Physical Exam      Future Appointments  Date Time Provider [place]  06/18/2022  8:15 AM Pixie Casino, MD [facility] None  06/24/2022  3:40 PM [facility] DEVICE REMOTES [facility]  06/26/2022 11:00 AM [facility] PA/NP [facility] None  08/22/2022 10:00 AM Marzetta Board, DPM [facility]  09/23/2022  3:40 PM [facility] DEVICE REMOTES [facility]  10/11/2022  9:45 AM [facility] None  10/14/2022  9:30 AM Marice Potter, MD [facility] None  12/23/2022  3:40 PM [facility] DEVICE REMOTES [facility]  03/24/2023  3:40 PM [facility] DEVICE REMOTES [facility]  06/23/2023  3:40 PM [facility] DEVICE REMOTES [facility]  09/22/2023  3:40 PM [facility] DEVICE REMOTES [facility]      ACTION: Home visit completed

## 2022-06-12 ENCOUNTER — Telehealth (HOSPITAL_COMMUNITY): Payer: Self-pay

## 2022-06-12 NOTE — Telephone Encounter (Signed)
Spoke to [facility] about cost of Nexletol and provided them the information of the [employer] and they report they had the wrong information documented but replaced with new information and it went through with no issues bringing copay to zero dollars as opposed to $95 that he paid earlier this week. [name] advised they would reimburse him for this if he brought in his receipt.  I notified his wife and him of same via voicemail.   Call complete.   [name], [place] 06/12/2022

## 2022-06-18 ENCOUNTER — Ambulatory Visit: Payer: Medicare Other | Attending: Internal Medicine | Admitting: Internal Medicine

## 2022-06-18 ENCOUNTER — Encounter: Payer: Self-pay | Admitting: Internal Medicine

## 2022-06-18 VITALS — BP 110/58 | HR 70 | Ht 69.0 in | Wt 168.4 lb

## 2022-06-18 DIAGNOSIS — M791 Myalgia, unspecified site: Secondary | ICD-10-CM | POA: Insufficient documentation

## 2022-06-18 DIAGNOSIS — R7989 Other specified abnormal findings of blood chemistry: Secondary | ICD-10-CM | POA: Insufficient documentation

## 2022-06-18 DIAGNOSIS — T466X5A Adverse effect of antihyperlipidemic and antiarteriosclerotic drugs, initial encounter: Secondary | ICD-10-CM | POA: Insufficient documentation

## 2022-06-18 DIAGNOSIS — T466X5D Adverse effect of antihyperlipidemic and antiarteriosclerotic drugs, subsequent encounter: Secondary | ICD-10-CM

## 2022-06-18 DIAGNOSIS — E7849 Other hyperlipidemia: Secondary | ICD-10-CM | POA: Insufficient documentation

## 2022-06-18 DIAGNOSIS — I2510 Atherosclerotic heart disease of native coronary artery without angina pectoris: Secondary | ICD-10-CM | POA: Diagnosis not present

## 2022-06-18 MED ORDER — ROSUVASTATIN CALCIUM 5 MG PO TABS: 5.0000 mg | ORAL_TABLET | Freq: Every day | ORAL | 3 refills | Status: DC

## 2022-06-18 NOTE — Patient Instructions (Addendum)
Medication Instructions:  STOP Nexletol  START rosuvastatin (Crestor) '5mg'$  daily  *If you need a refill on your cardiac medications before your next appointment, please call your pharmacy*   Lab Work: Non-Fasting liver function test after 2 weeks on Crestor  Fasting lipid panel in 3-4 months  If you have labs (blood work) drawn today and your tests are completely normal, you will receive your results only by: [facility] (if you have MyChart) OR A paper copy in the mail If you have any lab test that is abnormal or we need to change your treatment, we will call you to review the results.   Testing/Procedures: NONE   Follow-Up: At [facility], you and your health needs are our priority.  As part of our continuing mission to provide you with exceptional heart care, we have created designated Provider Care Teams.  These Care Teams include your primary Cardiologist (physician) and Advanced Practice Providers (APPs -  Physician Assistants and Nurse Practitioners) who all work together to provide you with the care you need, when you need it.  We recommend signing up for the patient portal called "MyChart".  Sign up information is provided on this After Visit Summary.  MyChart is used to connect with patients for Virtual Visits (Telemedicine).  Patients are able to view lab/test results, encounter notes, upcoming appointments, etc.  Non-urgent messages can be sent to your provider as well.   To learn more about what you can do with MyChart, go to [email or web address].    Your next appointment:    3-4 months with Dr. Debara Pickett - lipid clinic    Grant from [facility] - expires 09/21/2022   CARD NO.  [id number]     CARD STATUS  Active     [place]  [id number]     PCN  [id number]     [employer]  [id number]

## 2022-06-18 NOTE — Progress Notes (Signed)
LIPID CLINIC CONSULT NOTE  Chief Complaint:  Follow-up dyslipidemia  Primary Care Physician: Serita Grammes, MD  Primary Cardiologist:  Pixie Casino, MD  HPI:  Tommy Ward is a 81 y.o. male who is being seen today for the evaluation of dyslipidemia at the request of Serita Grammes, MD.  This is a pleasant 81 year old male patient of Dr. 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  Tommy Ward has a [place] heritage however he says most likely inherited is coronary artery disease and dyslipidemia from his mom and sister, both had high cholesterol though his mother died at age 82.  He was originally from [place] and was living in [place] prior to coming to [place].  He saw a cardiologist there named Dr. Erling Cruz in [place].  At one point he had stress testing which indicated a fixed defect suggestive of scar.  His LVEF was reduced between 40 to 45%.  Subsequently here he is also had repeat stress testing and echo which are confirmed evidence of prior heart attack.  This being said, he has never had a prior heart catheterization.  EKG today shows sinus rhythm with marked sinus arrhythmia and left bundle branch block, performed due to his coronary history which was not well elucidated in the chart.  He was also noted to be irregularly irregular on exam.  Most recently his lipid showed total cholesterol 400, triglycerides 295, HDL 51 and LDL 290.  In addition to his marked dyslipidemia, he reports significant statin intolerance to multiple different statins.  12/30/2019  Mr. Branam returns today for follow-up.  Overall he is doing well on Repatha.  He has had marked reduction in his lipids.  Most recently his lipids showed total cholesterol 255, triglycerides 398, HDL 44 and LDL 131.  This is down from 290.  Total cholesterol was over 400.  He denies any side effects with the medication.  I would like to see if we could drive  his cholesterol a little lower.  We talked about different options including possibly adding ezetimibe or Vascepa.  Cost may be an issue as the PCSK9 inhibitor is expensive.  07/28/2020  Tommy Ward is seen today in follow-up.  Unfortunately he was unable to tolerate ezetimibe which caused similar side effects as the statins.  Despite this though he has made some dietary changes and is more active.  His lipids are actually even better now with total of 193, triglycerides still elevated 261, HDL 52 and LDL 89.  This is on monotherapy with Repatha.  10/22/2021  Tommy Ward returns today for follow-up.  He just had repeat labs drawn.  He reports compliance with Repatha.  He saw Dr. Aundra Dubin in January.  Updated labs show total cholesterol 308, triglycerides 297, HDL 44 and LDL 205.  His LDL 3 months ago was 159 and 9 months prior to that was down to 38, which showed excellent control.  His LDL has been as high as 290 in the past.  I suspect there must be a significant dietary component to this.  Although he reports compliance with the medication and its very hard to believe his LDL is quite that high with it being significantly lower.  That being said he remains well above target.  Cannot tolerate statins or ezetimibe.  06/18/2022  Tommy Ward seen today for follow-up.  He had repeat lipids recently which showed actually some worsening of his cholesterol.  Total cholesterol 210, triglycerides  301, HDL 36 and LDL 113.  Interestingly his lipids were essentially normal in May 2022.  After speaking with him today to try to figure out why this was the issue, seems like that he was much more physically active than.  He is struggled some with neuropathy and is not riding his exercise bike regularly.  He definitely needs to resume this.  I do not see any significant improvement with adding Nexletol to the [place].  The Repatha is generally because most of his lipid reduction.  We talked about possibly going back to statins.   Although he had some hesitancy about this he did seem amenable to a low-dose of a statin and will try low-dose rosuvastatin.   PMHx:  Past Medical History:  Diagnosis Date   Abnormal stress test 12/05/2020   AKI (acute kidney injury) ([place]) 11/25/2019   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[place]) 08/24/2019   Anemia due to stage 4 chronic kidney disease ([place]) 03/20/2022   Aortic valve disorder 07/08/2014   B12 deficiency 03/22/2018   Benign hypertension with chronic kidney disease, stage III ([place]) 05/20/2019   Benign hypertension with CKD (chronic kidney disease) stage IV ([place]) 05/20/2019   CAD (coronary artery disease) 11/23/2020   Cancer ([place]) 08/2008   prostate ca   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   Cataract 11/2013   bilateral   Chronic renal insufficiency, stage III (moderate) (HCC) 67/61/9509   Chronic systolic heart failure (HCC)    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 02/24/2019   Coronary arteriosclerosis 07/06/2012   Decreased cardiac ejection fraction 05/20/2019   Depressed left ventricular ejection fraction 12/05/2020   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   Essential hypertension 07/06/2012   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   Gastroesophageal reflux disease without esophagitis 03/22/2018   GERD (gastroesophageal reflux disease)    HFrEF (heart failure with reduced ejection fraction) ([place]) 11/23/2020   Hypercalcemia of malignancy 02/16/2021   Hyperkalemia 08/24/2019   Hyperlipidemia    Hyperphosphatemia 11/25/2019   Hypertension    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   Left bundle branch block 07/06/2012   Malignant neoplasm of prostate ([place])    Malignant neoplasm of prostate ([place])    Metabolic bone disease 32/67/1245   Mixed hyperlipidemia 07/06/2012   Nonrheumatic mitral valve regurgitation 12/05/2020   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   Olecranon bursitis of left elbow 06/25/2018    Pre-diabetes 12/05/2020   Pulmonary hypertension ([place]) 12/05/2020   S/P radiation therapy > 12 wks ago 2010   prostate CA   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[place])    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[place]) 12/05/2020   Tobacco use 12/05/2020   Vitamin D deficiency 08/24/2019    Past Surgical History:  Procedure Laterality Date   BIV ICD INSERTION CRT-D N/A 05/07/2021   Procedure: BIV ICD INSERTION CRT-D;  Surgeon: Vickie Epley, MD;  Location: [facility];  Service: Cardiovascular;  Laterality: N/A;   CHOLECYSTECTOMY  01/2003   lap chole   left hip repaired     RIGHT/LEFT HEART CATH AND CORONARY ANGIOGRAPHY N/A 12/08/2020   Procedure: RIGHT/LEFT HEART CATH AND CORONARY ANGIOGRAPHY;  Surgeon: Wellington Hampshire, MD;  Location: [facility];  Service: Cardiovascular;  Laterality: N/A;   TEE WITHOUT CARDIOVERSION N/A 01/26/2021   Procedure: TRANSESOPHAGEAL ECHOCARDIOGRAM (TEE);  Surgeon: Larey Dresser, MD;  Location: [facility];  Service: Cardiovascular;  Laterality: N/A;    FAMHx:  Family History  Problem  Relation Age of Onset   Leukemia Mother    Prostate cancer Father    Heart attack Brother    Colon cancer Neg Hx    Rectal cancer Neg Hx    Stomach cancer Neg Hx    Esophageal cancer Neg Hx     SOCHx:   reports that he has been smoking cigarettes. He has a 25.00 pack-year smoking history. He has never used smokeless tobacco. He reports that he does not currently use alcohol. He reports that he does not use drugs.  ALLERGIES:  Allergies  Allergen Reactions   Ezetimibe Other (See Comments)    Myalgia   Gabapentin Itching and Other (See Comments)    Sore throat, breakouts   Statins Other (See Comments)    Myalgias (intolerance)    ROS: Pertinent items noted in HPI and remainder of comprehensive ROS otherwise negative.  HOME MEDS: Current Outpatient Medications on File Prior to Visit  Medication Sig Dispense Refill   aspirin EC 81  MG tablet Take 1 tablet (81 mg total) by mouth daily. 90 tablet 3   Bempedoic Acid (NEXLETOL) 180 MG TABS Take 1 tablet by mouth daily. 90 tablet 3   Cetirizine HCl 10 MG CAPS Take 1 capsule by mouth daily.     cholestyramine light (PREVALITE) 4 g packet Take 4 g by mouth daily.     dapagliflozin propanediol (FARXIGA) 10 MG TABS tablet Take 1 tablet (10 mg total) by mouth daily before breakfast. 90 tablet 3   digoxin (LANOXIN) 0.125 MG tablet Take 1 tablet (0.125 mg total) by mouth every other day. 45 tablet 3   enzalutamide (XTANDI) 40 MG tablet Take 160 mg by mouth daily.     Evolocumab (REPATHA SURECLICK) 675 MG/ML SOAJ INJECT 1 DOSE UNDER THE SKIN EVERY 14 DAYS 6 mL 3   metoprolol succinate (TOPROL XL) 25 MG 24 hr tablet Take 1 tablet (25 mg total) by mouth daily. 90 tablet 3   omeprazole (PRILOSEC) 40 MG capsule Take 40 mg by mouth daily.     pregabalin (LYRICA) 75 MG capsule Take 75 mg by mouth 3 (three) times daily.     spironolactone (ALDACTONE) 25 MG tablet Take 25 mg by mouth daily.     tamsulosin (FLOMAX) 0.4 MG CAPS capsule Take 1 capsule (0.4 mg total) by mouth daily. 30 capsule 0   torsemide (DEMADEX) 20 MG tablet Take 3 tablets (60 mg total) by mouth daily. 270 tablet 3   traZODone (DESYREL) 100 MG tablet Take 50 mg by mouth at bedtime.     No current facility-administered medications on file prior to visit.    LABS/IMAGING: No results found for this or any previous visit (from the past 48 hour(s)).  No results found.  LIPID PANEL:    Component Value Date/Time   CHOL 210 (H) 05/16/2022 1134   CHOL 400 (H) 02/25/2019 0905   TRIG 305 (H) 05/16/2022 1134   HDL 36 (L) 05/16/2022 1134   HDL 51 02/25/2019 0905   CHOLHDL 5.8 05/16/2022 1134   VLDL 61 (H) 05/16/2022 1134   LDLCALC 113 (H) 05/16/2022 1134   LDLCALC 290 (H) 02/25/2019 0905    WEIGHTS: Wt Readings from Last 3 Encounters:  06/18/22 168 lb 6.4 oz (76.4 kg)  06/11/22 171 lb 8 oz (77.8 kg)  06/06/22 173 lb  (78.5 kg)    VITALS: BP (!) 110/58   Pulse 70   Ht '5\' 9"'$  (1.753 m)   Wt 168 lb 6.4 oz (  76.4 kg)   SpO2 96%   BMI 24.87 kg/m   EXAM: Deferred  EKG: Deferred  ASSESSMENT: 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   Mr. Ivey has not had any significant improvement with addition of Nexletol to his Repatha.  I think diet and physical activity are big factors in his cholesterol.  He is amenable to starting a little more exercise riding a stationary bicycle for 10 to 15 minutes a day if possible.  He will continue to work on his diet.  We will go ahead and stop the Nexletol because it is more costly and I do not see any significant improvement with it.  Continue Repatha but will add low-dose rosuvastatin 5 mg daily.  Repeat lipids in about 3 months.  Because he had some elevation in LFTs in the past, we will check them again in about 2 weeks.  Follow-up with me in 3 months.  Pixie Casino, MD, [facility], [place] Director of the Advanced Lipid Disorders &  Cardiovascular Risk Reduction Clinic Diplomate of the American Board of Clinical Lipidology Attending Cardiologist  Direct Dial: [phone]  Fax: [phone]  Website:  [email or web address]  Nadean Corwin Vyla Pint 06/18/2022, 8:14 AM

## 2022-06-19 ENCOUNTER — Other Ambulatory Visit (HOSPITAL_COMMUNITY): Payer: Self-pay

## 2022-06-19 NOTE — Progress Notes (Signed)
Paramedicine Encounter    Patient ID: Baran Kuhrt, male    DOB: Sep 03, 1940, 81 y.o.   MRN: [id number]   Arrived for home visit for [facility] where he was reporting to be feeling good with no complaints today. He was seen in office by Dr. Debara Pickett yesterday where Nexletol was stopped and Crestor was started. His wife already picked up same. I reviewed all notes and meds. I filled pill box for one week with no refills needed at present.   Issaiah reports Dr. Debara Pickett wants lipids drawn in one week and was wondering if he could just have them done at his HF clinic visit next week. I will forward question to HF triage.   I reminded him of upcoming appointments and wrote down same.   He agreed to meet me in clinic next week and to bring all meds and pill box.   Bao requested dental prescriptions be moved to [facility] in [place] and to close his [facility] account. I contacted Dr. Hardie Shackleton (oral surgeons office) to have meds moved and contacted [facility] to close profile.   Home visit complete. I will see Pape in one week. He knows to reach out sooner if needed.   [name], [place] 06/19/2022   Patient Care Team: Serita Grammes, MD as PCP - General (Family Medicine) Debara Pickett Nadean Corwin, MD as PCP - Cardiology (Cardiology) Vickie Epley, MD as PCP - Electrophysiology (Cardiology) Marice Potter, MD as Consulting Physician (Oncology) Azzie Glatter, DDS as Referring Physician (Oral Surgery) Joie Bimler, MD as Consulting Physician (Urology)  Patient Active Problem List   Diagnosis Date Noted   Anemia due to stage 4 chronic kidney disease ([place]) 03/20/2022   Hypercalcemia of malignancy 35/00/9381   Chronic systolic heart failure ([place])    Abnormal stress test 12/05/2020   Depressed left ventricular ejection fraction 12/05/2020   Nonrheumatic mitral valve regurgitation 12/05/2020   Pre-diabetes 12/05/2020   Pulmonary hypertension ([place]) 12/05/2020   Nonrheumatic tricuspid  valve regurgitation 12/05/2020   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[place]) 12/05/2020   Tobacco use 12/05/2020   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   HFrEF (heart failure with reduced ejection fraction) ([place]) 11/23/2020   CAD (coronary artery disease) 11/23/2020   GERD (gastroesophageal reflux disease)    Hyperlipidemia    Hypertension    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[place]) 06/02/2020   Malignant neoplasm of prostate ([place]) 06/02/2020   AKI (acute kidney injury) ([place]) 11/25/2019   Hyperphosphatemia 11/25/2019   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[place]) 08/24/2019   Hyperkalemia 82/99/3716   Metabolic bone disease 96/78/9381   Vitamin D deficiency 08/24/2019   Benign hypertension with chronic kidney disease, stage III ([place]) 05/20/2019   Decreased cardiac ejection fraction 05/20/2019   Benign hypertension with CKD (chronic kidney disease) stage IV ([place]) 05/20/2019   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 02/24/2019   Olecranon bursitis of left elbow 06/25/2018   B12 deficiency 03/22/2018   Gastroesophageal reflux disease without esophagitis 03/22/2018   Chronic renal insufficiency, stage III (moderate) (HCC) 11/27/2015   Aortic valve disorder 07/08/2014   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   Cataract 11/2013   Essential hypertension 07/06/2012   Coronary arteriosclerosis 07/06/2012   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   Left bundle branch block 07/06/2012   Mixed hyperlipidemia 07/06/2012   Cancer ([place]) 08/2008   S/P radiation therapy > 12 wks ago 2010    Current Outpatient Medications:  aspirin EC 81 MG tablet, Take 1 tablet (81 mg total) by mouth daily., Disp: 90 tablet, Rfl: 3   Bempedoic Acid (NEXLETOL) 180 MG TABS, Take 1 tablet by mouth daily., Disp: 90 tablet, Rfl: 3   Cetirizine HCl 10 MG CAPS, Take 1 capsule by mouth daily., Disp: , Rfl:    cholestyramine  light (PREVALITE) 4 g packet, Take 4 g by mouth daily., Disp: , Rfl:    dapagliflozin propanediol (FARXIGA) 10 MG TABS tablet, Take 1 tablet (10 mg total) by mouth daily before breakfast., Disp: 90 tablet, Rfl: 3   digoxin (LANOXIN) 0.125 MG tablet, Take 1 tablet (0.125 mg total) by mouth every other day., Disp: 45 tablet, Rfl: 3   enzalutamide (XTANDI) 40 MG tablet, Take 160 mg by mouth daily., Disp: , Rfl:    Evolocumab (REPATHA SURECLICK) 549 MG/ML SOAJ, INJECT 1 DOSE UNDER THE SKIN EVERY 14 DAYS, Disp: 6 mL, Rfl: 3   metoprolol succinate (TOPROL XL) 25 MG 24 hr tablet, Take 1 tablet (25 mg total) by mouth daily., Disp: 90 tablet, Rfl: 3   omeprazole (PRILOSEC) 40 MG capsule, Take 40 mg by mouth daily., Disp: , Rfl:    pregabalin (LYRICA) 75 MG capsule, Take 75 mg by mouth 3 (three) times daily., Disp: , Rfl:    rosuvastatin (CRESTOR) 5 MG tablet, Take 1 tablet (5 mg total) by mouth daily., Disp: 90 tablet, Rfl: 3   spironolactone (ALDACTONE) 25 MG tablet, Take 25 mg by mouth daily., Disp: , Rfl:    tamsulosin (FLOMAX) 0.4 MG CAPS capsule, Take 1 capsule (0.4 mg total) by mouth daily., Disp: 30 capsule, Rfl: 0   torsemide (DEMADEX) 20 MG tablet, Take 3 tablets (60 mg total) by mouth daily., Disp: 270 tablet, Rfl: 3   traZODone (DESYREL) 100 MG tablet, Take 50 mg by mouth at bedtime., Disp: , Rfl:  Allergies  Allergen Reactions   Ezetimibe Other (See Comments)    Myalgia   Gabapentin Itching and Other (See Comments)    Sore throat, breakouts   Statins Other (See Comments)    Myalgias (intolerance)     Social History   Socioeconomic History   Marital status: Married    Spouse name: Not on file   Number of children: Not on file   Years of education: Not on file   Highest education level: Not on file  Occupational History   Not on file  Tobacco Use   Smoking status: Every Day    Packs/day: 0.50    Years: 50.00    Total pack years: 25.00    Types: Cigarettes   Smokeless tobacco:  Never  Vaping Use   Vaping Use: Never used  Substance and Sexual Activity   Alcohol use: Not Currently   Drug use: Never   Sexual activity: Not on file  Other Topics Concern   Not on file  Social History Narrative   Not on file   Social Determinants of Health   Financial Resource Strain: Low Risk  (05/22/2022)   Overall Financial Resource Strain (CARDIA)    Difficulty of Paying Living Expenses: Not very hard  Food Insecurity: No Food Insecurity (05/22/2022)   Hunger Vital Sign    Worried About Running Out of Food in the Last Year: Never true    Ran Out of Food in the Last Year: Never true  Transportation Needs: No Transportation Needs (05/22/2022)   PRAPARE - Transportation    Lack of Transportation (Medical): No    Lack  of Transportation (Non-Medical): No  Physical Activity: Not on file  Stress: Not on file  Social Connections: Not on file  Intimate Partner Violence: Not on file    Physical Exam      Future Appointments  Date Time Provider [place]  06/24/2022  3:40 PM [facility] DEVICE REMOTES [facility]  06/26/2022 11:00 AM [facility] PA/NP [facility] None  08/22/2022 10:00 AM Marzetta Board, DPM [facility]  09/23/2022  3:40 PM [facility] DEVICE REMOTES [facility]  10/10/2022 11:45 AM Hilty, Nadean Corwin, MD [facility] None  10/11/2022  9:45 AM [facility] None  10/14/2022  9:30 AM Marice Potter, MD [facility] None  12/23/2022  3:40 PM [facility] DEVICE REMOTES [facility]  03/24/2023  3:40 PM [facility] DEVICE REMOTES [facility]  06/23/2023  3:40 PM [facility] DEVICE REMOTES [facility]  09/22/2023  3:40 PM [facility] DEVICE REMOTES [facility]     ACTION: Home visit completed

## 2022-06-24 ENCOUNTER — Ambulatory Visit (INDEPENDENT_AMBULATORY_CARE_PROVIDER_SITE_OTHER): Payer: Medicare Other

## 2022-06-24 DIAGNOSIS — I5022 Chronic systolic (congestive) heart failure: Secondary | ICD-10-CM

## 2022-06-25 NOTE — Progress Notes (Signed)
PCP: Serita Grammes, MD Cardiology: Dr. [name] HF Cardiology: Dr. Aundra Dubin  81 y.o. with history of prostate cancer, CKD stage 3, CAD, and ischemic cardiomyopathy was referred by Dr. [name] for evaluation of CHF.  Patient developed gradually worsening exertional dyspnea starting early this year.  The shortness of breath has been especially bad for about 1.5 months.  He had an echo done in 4/22, showing EF < 20%, moderate-severe LV dilation, normal RV, severe MR.  LHC/RHC was done showing low cardiac index at 1.81 and occluded mid LAD.  No intervention.  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   TEE was done 6/22, showing EF < 20% with septal-lateral dyssynchrony, mildly decreased RV systolic function, moderate TR, moderate central MR with ERO 0.2 cm^2.  Echo in 7/22 similarly showed EF 20%, normal RV, moderate MR.  Patient had Medtronic CRT-D device implanted.  Echo in 1/23 showed EF <20% with moderate LV dilation, mildly decreased RV systolic function, normal IVC.   Echo in 9/23 showed EF 20-25%, mild MR.   Today he returns for HF follow up with his wife and paramedic, Heather. Overall feeling fine. He has dizziness and nausea recently. Main complaint is hand tremors. He is not short of breath walking on flat ground.  Denies palpitaitons, CP, dizziness, edema, or PND/Orthopnea. Appetite fair. No fever or chills. Weight at home 165 lbs. Taking all medications. Remains quit from smoking since 7/23.  ECG (personally reviewed): none ordered today.  Labs (5/22): K 3.4, creatinine 1.7 => 2.15, AST 123 => 189, ALT 322 =>190, tbili 1.7, Hgb 12, LDL 38 Labs (6/22): Viral hepatitis serologies negative Labs (8/22): K 4.2, creatinine 1.87 Labs (10/22): K 3.7, creatinine 2 Labs (11/22): LDL 159 Labs (12/22): K 3.7, creatinine 2 Labs (4/23): K 4.4, creatinine 2.7, LFTs normal Labs (7/23): K 4.4, creatinine 2.35 Labs  (10/23): K 4.0, creatinine 2.4, LFTs normal  PMH:  1. Hyperlipidemia 2. HTN 3. CKD stage 3 4. 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  Treated with radiation and currently on denosumab.   5. Cholecystectomy 6. CAD: LHC in 4/22 with 80% proximal RCA stenosis (nondominant), totally occluded mLAD with collaterals, 60% D1.  No intervention.  7. Chronic systolic CHF: Ischemic cardiomyopathy.  Medtronic CRT-D.  - Echo (4/22) with EF < 20%, moderate-severe LV dilation, normal RV, severe LAE, severe MR.  - RHC (4/22): mean RA 3, PA 39/10, mean PCWP 12, CI 1.81 - Echo (6/22): EF < 20% with septal-lateral dyssynchrony, mildly decreased RV systolic function, moderate TR, moderate central MR with ERO 0.2 cm^2.  - Echo (7/22): EF <20%, mild LVH, normal RV, moderate MR.  - Echo (1/23: EF <20% with moderate LV dilation, mildly decreased RV systolic function, normal IVC, mild-moderate MR. 8. Mitral regurgitation: Severe on 4/22 echo.  - TEE (6/22) with moderate central MR with ERO 0.2 cm^2 (functional, annular dilatation).  - Echo (1/23) with mild-moderate MR.  - Echo (9/23): EF 20-25%, mild MR.  9. Elevated LFTs: Viral hepatitis labs negative.  RUQ [place] (6/22) was not suggestive of cirrhosis, ascites noted.  10. Hypercalcemia: Due to Ca supplementation in setting of renal dysfunction.  11. Chronic LBBB  Social History   Socioeconomic History   Marital status: Married    Spouse name: Not on file   Number of children: Not on file   Years of education: Not on file   Highest education level: Not on file  Occupational History  Not on file  Tobacco Use   Smoking status: Every Day    Packs/day: 0.50    Years: 50.00    Total pack years: 25.00    Types: Cigarettes   Smokeless tobacco: Never  Vaping Use   Vaping Use: Never used  Substance and Sexual Activity   Alcohol use: Not Currently   Drug use: Never   Sexual activity: Not on file  Other Topics Concern   Not on file  Social History Narrative    Not on file   Social Determinants of Health   Financial Resource Strain: Low Risk  (05/22/2022)   Overall Financial Resource Strain (CARDIA)    Difficulty of Paying Living Expenses: Not very hard  Food Insecurity: No Food Insecurity (05/22/2022)   Hunger Vital Sign    Worried About Running Out of Food in the Last Year: Never true    Ran Out of Food in the Last Year: Never true  Transportation Needs: No Transportation Needs (05/22/2022)   PRAPARE - Hydrologist (Medical): No    Lack of Transportation (Non-Medical): No  Physical Activity: Not on file  Stress: Not on file  Social Connections: Not on file  Intimate Partner Violence: Not on file   Family History  Problem Relation Age of Onset   Leukemia Mother    Prostate cancer Father    Heart attack Brother    Colon cancer Neg Hx    Rectal cancer Neg Hx    Stomach cancer Neg Hx    Esophageal cancer Neg Hx    ROS: All systems reviewed and negative except as per HPI.   Current Outpatient Medications  Medication Sig Dispense Refill   aspirin EC 81 MG tablet Take 1 tablet (81 mg total) by mouth daily. 90 tablet 3   Cetirizine HCl 10 MG CAPS Take 1 capsule by mouth daily.     cholestyramine light (PREVALITE) 4 g packet Take 4 g by mouth daily.     dapagliflozin propanediol (FARXIGA) 10 MG TABS tablet Take 1 tablet (10 mg total) by mouth daily before breakfast. 90 tablet 3   digoxin (LANOXIN) 0.125 MG tablet Take 1 tablet (0.125 mg total) by mouth every other day. 45 tablet 3   enzalutamide (XTANDI) 40 MG tablet Take 160 mg by mouth daily.     Evolocumab (REPATHA SURECLICK) 814 MG/ML SOAJ INJECT 1 DOSE UNDER THE SKIN EVERY 14 DAYS 6 mL 3   metoprolol succinate (TOPROL XL) 25 MG 24 hr tablet Take 1 tablet (25 mg total) by mouth daily. 90 tablet 3   omeprazole (PRILOSEC) 40 MG capsule Take 40 mg by mouth daily.     pentoxifylline (TRENTAL) 400 MG CR tablet Take 400 mg by mouth 3 (three) times daily with  meals.     pregabalin (LYRICA) 75 MG capsule Take 75 mg by mouth 3 (three) times daily.     rosuvastatin (CRESTOR) 5 MG tablet Take 5 mg by mouth daily.     spironolactone (ALDACTONE) 25 MG tablet Take 25 mg by mouth daily.     tamsulosin (FLOMAX) 0.4 MG CAPS capsule Take 1 capsule (0.4 mg total) by mouth daily. 30 capsule 0   torsemide (DEMADEX) 20 MG tablet Take 3 tablets (60 mg total) by mouth daily. 270 tablet 3   traZODone (DESYREL) 100 MG tablet Take 50 mg by mouth at bedtime.     No current facility-administered medications for this encounter.   Wt Readings from Last 3  Encounters:  06/26/22 75.1 kg (165 lb 9.6 oz)  06/18/22 76.4 kg (168 lb 6.4 oz)  06/11/22 77.8 kg (171 lb 8 oz)   BP 120/70   Pulse 69   Wt 75.1 kg (165 lb 9.6 oz)   SpO2 94%   BMI 24.45 kg/m  Physical Exam General:  NAD. No resp difficulty HEENT: Normal Neck: Supple. No JVD. Carotids 2+ bilat; no bruits. No lymphadenopathy or thryomegaly appreciated. Cor: PMI nondisplaced. Regular rate & rhythm. No rubs, gallops or murmurs. Lungs: Clear Abdomen: Soft, nontender, nondistended. No hepatosplenomegaly. No bruits or masses. Good bowel sounds. Extremities: No cyanosis, clubbing, rash, edema Neuro: Alert & oriented x 3, cranial nerves grossly intact. Moves all 4 extremities w/o difficulty. Affect pleasant.  Assessment/Plan: 1. CAD: Occluded mid LAD and 80% proximal stenosis in nondominant RCA in 4/22.  No intervention.  No chest pain.  - Continue ASA 81 mg daily.  - Continue rosuvastatin, Repatha and Nexletol. He is followed at the [facility].  Check lipids/LFTs today, goal LDL < 55.  2. Chronic systolic CHF: Ischemic cardiomyopathy.  Echo in 4/22 with EF < 20%, moderate-severe LV dilation, normal RV, severe LAE, severe MR.  RHC in 4/22 with CI 1.8.  TEE in 6/22 with EF <20%, dyssynchrony, mildly decreased RV systolic function.  Echo in 7/22 with EF < 20%, moderate MR.  Medtronic CRT-D device placed.  Echo in 1/23  showed EF < 20%, mild RV dysfunction.  Echo in 9/23 showed EF 20-25%, mild MR. No change to echo since CRT but he has felt better symptomatically.  NYHA class I-II, he is not volume overloaded on exam. Weight down 10 lbs and not eating as much.  GDMT has been limited by renal function (CKD stage 3b). - With reduced appetite and dizziness, decrease torsemide to 40 mg daily. BMET and BNP today. - Continue Toprol XL 25 mg daily.  - Continue Farxiga 10 mg daily.     - Continue spironolactone 25 mg daily.   - Continue digoxin 0.125 qod.  Check digoxin level today.    - Off Entresto and losartan with CKD and soft BP.     3. Mitral regurgitation: Severe on 5/22 TTE. However, TEE in 6/22 showed moderate functional central MR.   Mild-moderate MR on echo 1/23 post-CRT, mild MR on 9/23 echo.  4. Elevated LFTs: Viral hepatitis labs negative.  Abdominal US did not show cirrhosis but did show ascites.  Etiology uncertain, ?congestive hepatopathy.  - Recent LFTs normal. 5. CKD stage 3: Follow closely with medication titration, BMET today.  6. Smoking: He quit in 7/23, congratulated.  7. Tremors: Needs PCP follow up. - Check dig level today.  Continue paramedicine, they are helping him greatly.  Follow up in 3 months with Dr. Wynema Birch [facility] FNP-BC 06/26/2022

## 2022-06-26 ENCOUNTER — Telehealth (HOSPITAL_COMMUNITY): Payer: Self-pay

## 2022-06-26 ENCOUNTER — Other Ambulatory Visit (HOSPITAL_COMMUNITY): Payer: Self-pay

## 2022-06-26 ENCOUNTER — Encounter (HOSPITAL_COMMUNITY): Payer: Self-pay

## 2022-06-26 ENCOUNTER — Ambulatory Visit (HOSPITAL_COMMUNITY)
Admission: RE | Admit: 2022-06-26 | Discharge: 2022-06-26 | Disposition: A | Discharge status: Home / Self Care | Payer: Medicare Other | Source: Ambulatory Visit | Attending: Family Medicine | Admitting: Family Medicine

## 2022-06-26 ENCOUNTER — Other Ambulatory Visit: Payer: Self-pay

## 2022-06-26 VITALS — BP 120/70 | HR 69 | Wt 165.6 lb

## 2022-06-26 DIAGNOSIS — N1832 Chronic kidney disease, stage 3b: Secondary | ICD-10-CM | POA: Diagnosis not present

## 2022-06-26 DIAGNOSIS — I5022 Chronic systolic (congestive) heart failure: Secondary | ICD-10-CM | POA: Insufficient documentation

## 2022-06-26 DIAGNOSIS — I130 Hypertensive heart and chronic kidney disease with heart failure and stage 1 through stage 4 chronic kidney disease, or unspecified chronic kidney disease: Secondary | ICD-10-CM | POA: Diagnosis not present

## 2022-06-26 DIAGNOSIS — F17200 Nicotine dependence, unspecified, uncomplicated: Secondary | ICD-10-CM | POA: Diagnosis not present

## 2022-06-26 DIAGNOSIS — I2510 Atherosclerotic heart disease of native coronary artery without angina pectoris: Secondary | ICD-10-CM | POA: Insufficient documentation

## 2022-06-26 DIAGNOSIS — Z7982 Long term (current) use of aspirin: Secondary | ICD-10-CM | POA: Insufficient documentation

## 2022-06-26 DIAGNOSIS — I255 Ischemic cardiomyopathy: Secondary | ICD-10-CM | POA: Diagnosis not present

## 2022-06-26 DIAGNOSIS — I340 Nonrheumatic mitral (valve) insufficiency: Secondary | ICD-10-CM | POA: Diagnosis not present

## 2022-06-26 DIAGNOSIS — R7989 Other specified abnormal findings of blood chemistry: Secondary | ICD-10-CM

## 2022-06-26 DIAGNOSIS — Z8546 Personal history of malignant neoplasm of prostate: Secondary | ICD-10-CM | POA: Insufficient documentation

## 2022-06-26 DIAGNOSIS — K761 Chronic passive congestion of liver: Secondary | ICD-10-CM | POA: Insufficient documentation

## 2022-06-26 DIAGNOSIS — Z79899 Other long term (current) drug therapy: Secondary | ICD-10-CM | POA: Insufficient documentation

## 2022-06-26 DIAGNOSIS — N183 Chronic kidney disease, stage 3 unspecified: Secondary | ICD-10-CM

## 2022-06-26 DIAGNOSIS — R42 Dizziness and giddiness: Secondary | ICD-10-CM | POA: Insufficient documentation

## 2022-06-26 DIAGNOSIS — E7849 Other hyperlipidemia: Secondary | ICD-10-CM

## 2022-06-26 DIAGNOSIS — R251 Tremor, unspecified: Secondary | ICD-10-CM

## 2022-06-26 DIAGNOSIS — Z720 Tobacco use: Secondary | ICD-10-CM

## 2022-06-26 LAB — COMPREHENSIVE METABOLIC PANEL
ALT: 11 U/L (ref 0–44)
AST: 16 U/L (ref 15–41)
Albumin: 3.7 g/dL (ref 3.5–5.0)
Alkaline Phosphatase: 52 U/L (ref 38–126)
Anion gap: 15 (ref 5–15)
BUN: 39 mg/dL — ABNORMAL HIGH (ref 8–23)
CO2: 29 mmol/L (ref 22–32)
Calcium: 10 mg/dL (ref 8.9–10.3)
Chloride: 94 mmol/L — ABNORMAL LOW (ref 98–111)
Creatinine, Ser: 2.53 mg/dL — ABNORMAL HIGH (ref 0.61–1.24)
GFR, Estimated: 25 mL/min — ABNORMAL LOW (ref >60)
Glucose, Bld: 128 mg/dL — ABNORMAL HIGH (ref 70–99)
Potassium: 3.6 mmol/L (ref 3.5–5.1)
Sodium: 138 mmol/L (ref 135–145)
Total Bilirubin: 1.1 mg/dL (ref 0.3–1.2)
Total Protein: 6.9 g/dL (ref 6.5–8.1)

## 2022-06-26 LAB — BRAIN NATRIURETIC PEPTIDE: B Natriuretic Peptide: 503.2 pg/mL — ABNORMAL HIGH (ref 0.0–100.0)

## 2022-06-26 LAB — CUP PACEART REMOTE DEVICE CHECK
Battery Remaining Longevity: 72 mo
Battery Remaining Percentage: 82 %
Battery Voltage: 2.98 V
Brady Statistic AP VP Percent: 56 %
Brady Statistic AP VS Percent: 1.6 %
Brady Statistic AS VP Percent: 35 %
Brady Statistic AS VS Percent: 3 %
Brady Statistic RA Percent Paced: 53 %
Date Time Interrogation Session: 20231106010208
HighPow Impedance: 89 Ohm
Implantable Lead Connection Status: 753985
Implantable Lead Connection Status: 753985
Implantable Lead Connection Status: 753985
Implantable Lead Implant Date: 20220919
Implantable Lead Implant Date: 20220919
Implantable Lead Implant Date: 20220919
Implantable Lead Location: 753858
Implantable Lead Location: 753859
Implantable Lead Location: 753860
Implantable Pulse Generator Implant Date: 20220919
Lead Channel Impedance Value: 550 Ohm
Lead Channel Impedance Value: 580 Ohm
Lead Channel Impedance Value: 650 Ohm
Lead Channel Pacing Threshold Amplitude: 0.5 V
Lead Channel Pacing Threshold Amplitude: 0.75 V
Lead Channel Pacing Threshold Amplitude: 1 V
Lead Channel Pacing Threshold Pulse Width: 0.5 ms
Lead Channel Pacing Threshold Pulse Width: 0.5 ms
Lead Channel Pacing Threshold Pulse Width: 0.7 ms
Lead Channel Sensing Intrinsic Amplitude: 0.6 mV
Lead Channel Sensing Intrinsic Amplitude: 12 mV
Lead Channel Setting Pacing Amplitude: 1.75 V
Lead Channel Setting Pacing Amplitude: 2 V
Lead Channel Setting Pacing Amplitude: 2.5 V
Lead Channel Setting Pacing Pulse Width: 0.5 ms
Lead Channel Setting Pacing Pulse Width: 0.7 ms
Lead Channel Setting Sensing Sensitivity: 0.5 mV
Pulse Gen Serial Number: 111046561
Zone Setting Status: 755011

## 2022-06-26 LAB — LIPID PANEL
Cholesterol: 184 mg/dL (ref 0–200)
HDL: 38 mg/dL — ABNORMAL LOW (ref >40)
LDL Cholesterol: 76 mg/dL (ref 0–99)
Total CHOL/HDL Ratio: 4.8 RATIO
Triglycerides: 351 mg/dL — ABNORMAL HIGH (ref <150)
VLDL: 70 mg/dL — ABNORMAL HIGH (ref 0–40)

## 2022-06-26 LAB — DIGOXIN LEVEL: Digoxin Level: 0.9 ng/mL (ref 0.8–2.0)

## 2022-06-26 MED ORDER — TORSEMIDE 20 MG PO TABS: ORAL_TABLET | ORAL | 3 refills | Status: DC

## 2022-06-26 MED ORDER — TORSEMIDE 20 MG PO TABS: 40.0000 mg | ORAL_TABLET | Freq: Every day | ORAL | 3 refills | Status: DC

## 2022-06-26 NOTE — Progress Notes (Signed)
Paramedicine Encounter    Patient ID: Tommy Ward, male    DOB: 1941-03-03, 81 y.o.   MRN: [id number]   Met with Tommy Ward in clinic today where he was seen by Allena Katz, NP. Tommy Ward reports some increased dizziness, nausea and tremors over the last week. He denied any chest pain or increased shortness of breath, weight gain or swelling. He is down 5lbs from last weeks visit. He denied any vomiting or diarrhea but admits decreased appetite. He was advised by NP to reach out to PCP about tremors, weight loss and decreased appetite. Tommy Ward- [name] wife plans to schedule appointment with Dr. Jeryl Ward asap.    Vitals were obtained:  WT- 165lbs (in clinic) 163lbs (at home)  BP- 120/70 HR- 69 RR- 16 O2- 94%   MED CHANGES: DECREASE TORSEMIDE TO 40MG DAILY   Labs today obtained and faxed to Dr. Debara PickettErnestine Ward requests labs be faxed to his other physicians. I will message triage for same.   I reviewed meds and confirmed same. I filled pill box for one week.    Refills- trental (ready at [facility] in [place])   I will see Tommy Ward in one week. Visit complete.   [name], [place] 06/26/2022   Patient Care Team: Serita Grammes, MD as PCP - General (Family Medicine) Tommy Ward Tommy Corwin, MD as PCP - Cardiology (Cardiology) Tommy Epley, MD as PCP - Electrophysiology (Cardiology) Tommy Potter, MD as Consulting Physician (Oncology) Tommy Ward, DDS as Referring Physician (Oral Surgery) Tommy Bimler, MD as Consulting Physician (Urology)  Patient Active Problem List   Diagnosis Date Noted   Anemia due to stage 4 chronic kidney disease ([place]) 03/20/2022   Hypercalcemia of malignancy 46/65/9935   Chronic systolic heart failure ([place])    Abnormal stress test 12/05/2020   Depressed left ventricular ejection fraction 12/05/2020   Nonrheumatic mitral valve regurgitation 12/05/2020   Pre-diabetes 12/05/2020   Pulmonary hypertension ([place]) 12/05/2020    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[place]) 06/02/2020   Malignant neoplasm of prostate ([place]) 06/02/2020   AKI (acute kidney injury) ([place]) 11/25/2019   Hyperphosphatemia 11/25/2019   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[place]) 08/24/2019   Hyperkalemia 70/17/7939   Metabolic bone disease 03/00/9233   Vitamin D deficiency 08/24/2019   Benign hypertension with chronic kidney disease, stage III ([place]) 05/20/2019   Decreased cardiac ejection fraction 05/20/2019   Benign hypertension with CKD (chronic kidney disease) stage IV ([place]) 05/20/2019   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 02/24/2019   Olecranon bursitis of left elbow 06/25/2018   B12 deficiency 03/22/2018   Gastroesophageal reflux disease without esophagitis 03/22/2018   Chronic renal insufficiency, stage III (moderate) ([facility]) 11/27/2015   Aortic valve disorder 07/08/2014   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   Cataract 11/2013   Essential hypertension 07/06/2012   Coronary arteriosclerosis 07/06/2012   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   Left bundle branch block 07/06/2012   Mixed hyperlipidemia 07/06/2012   Cancer ([place]) 08/2008   S/P radiation therapy > 12 wks ago 2010    Current Outpatient Medications:  aspirin EC 81 MG tablet, Take 1 tablet (81 mg total) by mouth daily., Disp: 90 tablet, Rfl: 3   Cetirizine HCl 10 MG CAPS, Take 1 capsule by mouth daily., Disp: , Rfl:    cholestyramine light (PREVALITE) 4 g packet, Take 4 g by mouth daily., Disp: , Rfl:     dapagliflozin propanediol (FARXIGA) 10 MG TABS tablet, Take 1 tablet (10 mg total) by mouth daily before breakfast., Disp: 90 tablet, Rfl: 3   digoxin (LANOXIN) 0.125 MG tablet, Take 1 tablet (0.125 mg total) by mouth every other day., Disp: 45 tablet, Rfl: 3   enzalutamide (XTANDI) 40 MG tablet, Take 160 mg by mouth daily., Disp: , Rfl:    Evolocumab (REPATHA SURECLICK) 300 MG/ML SOAJ, INJECT 1 DOSE UNDER THE SKIN EVERY 14 DAYS, Disp: 6 mL, Rfl: 3   metoprolol succinate (TOPROL XL) 25 MG 24 hr tablet, Take 1 tablet (25 mg total) by mouth daily., Disp: 90 tablet, Rfl: 3   omeprazole (PRILOSEC) 40 MG capsule, Take 40 mg by mouth daily., Disp: , Rfl:    pentoxifylline (TRENTAL) 400 MG CR tablet, Take 400 mg by mouth 3 (three) times daily with meals., Disp: , Rfl:    pregabalin (LYRICA) 75 MG capsule, Take 75 mg by mouth 3 (three) times daily., Disp: , Rfl:    rosuvastatin (CRESTOR) 5 MG tablet, Take 5 mg by mouth daily., Disp: , Rfl:    spironolactone (ALDACTONE) 25 MG tablet, Take 25 mg by mouth daily., Disp: , Rfl:    tamsulosin (FLOMAX) 0.4 MG CAPS capsule, Take 1 capsule (0.4 mg total) by mouth daily., Disp: 30 capsule, Rfl: 0   torsemide (DEMADEX) 20 MG tablet, Take 3 tablets (60 mg total) by mouth daily., Disp: 270 tablet, Rfl: 3   traZODone (DESYREL) 100 MG tablet, Take 50 mg by mouth at bedtime., Disp: , Rfl:  Allergies  Allergen Reactions   Ezetimibe Other (See Comments)    Myalgia   Gabapentin Itching and Other (See Comments)    Sore throat, breakouts   Statins Other (See Comments)    Myalgias (intolerance)     Social History   Socioeconomic History   Marital status: Married    Spouse name: Not on file   Number of children: Not on file   Years of education: Not on file   Highest education level: Not on file  Occupational History   Not on file  Tobacco Use   Smoking status: Every Day    Packs/day: 0.50    Years: 50.00    Total pack years: 25.00    Types: Cigarettes    Smokeless tobacco: Never  Vaping Use   Vaping Use: Never used  Substance and Sexual Activity   Alcohol use: Not Currently   Drug use: Never   Sexual activity: Not on file  Other Topics Concern   Not on file  Social History Narrative   Not on file   Social Determinants of Health   Financial Resource Strain: Low Risk  (05/22/2022)   Overall Financial Resource Strain (CARDIA)    Difficulty of Paying Living Expenses: Not very hard  Food Insecurity: No Food Insecurity (05/22/2022)   Hunger Vital Sign    Worried About Running Out of Food in the Last Year: Never true    Ran Out of Food in the Last Year: Never true  Transportation Needs: No Transportation Needs (05/22/2022)   PRAPARE - Transportation    Lack of Transportation (Medical): No    Lack  of Transportation (Non-Medical): No  Physical Activity: Not on file  Stress: Not on file  Social Connections: Not on file  Intimate Partner Violence: Not on file    Physical Exam      Future Appointments  Date Time Provider [place]  06/26/2022 11:00 AM [facility] PA/NP [facility] None  08/22/2022 10:00 AM Marzetta Board, DPM [facility]  09/23/2022  3:40 PM [facility] DEVICE REMOTES [facility]  10/10/2022 11:45 AM Hilty, Tommy Corwin, MD [facility] None  10/11/2022  9:45 AM [facility] None  10/14/2022  9:30 AM Tommy Potter, MD [facility] None  12/23/2022  3:40 PM [facility] DEVICE REMOTES [facility]  03/24/2023  3:40 PM [facility] DEVICE REMOTES [facility]  06/23/2023  3:40 PM [facility] DEVICE REMOTES [facility]  09/22/2023  3:40 PM [facility] DEVICE REMOTES [facility]     ACTION: Home visit completed

## 2022-06-26 NOTE — Patient Instructions (Signed)
DECREASE Torsemide to 40 mg  (2 tabs) daily  Labs today We will only contact you if something comes back abnormal or we need to make some changes. Otherwise no news is good news!  Your physician recommends that you schedule a follow-up appointment in: 3 months with Dr Aundra Dubin  Do the following things EVERYDAY: Weigh yourself in the morning before breakfast. Write it down and keep it in a log. Take your medicines as prescribed Eat low salt foods--Limit salt (sodium) to 2000 mg per day.  Stay as active as you can everyday Limit all fluids for the day to less than 2 liters   At the [facility], you and your health needs are our priority. As part of our continuing mission to provide you with exceptional heart care, we have created designated Provider Care Teams. These Care Teams include your primary Cardiologist (physician) and Advanced Practice Providers (APPs- Physician Assistants and Nurse Practitioners) who all work together to provide you with the care you need, when you need it.   You may see any of the following providers on your designated Care Team at your next follow up: Dr Glori Bickers Dr Loralie Champagne Dr. Roxana Hires, NP Lyda Jester, [place] [facility] [place], [place] Na, NP Audry Riles, PharmD   Please be sure to bring in all your medications bottles to every appointment.    If you have any questions or concerns before your next appointment please send [place] a message through [place] or call our office at [phone].    TO LEAVE A MESSAGE FOR THE NURSE SELECT OPTION 2, PLEASE LEAVE A MESSAGE INCLUDING: YOUR NAME DATE OF BIRTH CALL BACK NUMBER REASON FOR CALL**this is important as we prioritize the call backs  YOU WILL RECEIVE A CALL BACK THE SAME DAY AS LONG AS YOU CALL BEFORE 4:00 PM

## 2022-06-26 NOTE — Telephone Encounter (Addendum)
  Pt aware, agreeable, and verbalized understanding   ----- Message from [name], EMT sent at 06/26/2022  2:26 PM EST ----- Noted- changes will be made in pill box. Thanks!  ----- Message ----- From: Rafael Bihari, FNP Sent: 06/26/2022   1:28 PM EST To: Pixie Casino, MD; [name], EMT; #  Liver and kidney function stable.   Please change torsemide to 60 mg daily alternating with 40 mg every other day.  I will forward lipids to Dr. Debara Pickett

## 2022-07-01 ENCOUNTER — Telehealth (HOSPITAL_COMMUNITY): Payer: Self-pay | Admitting: Cardiology

## 2022-07-01 NOTE — Telephone Encounter (Signed)
-----   Message from [name], EMT sent at 06/26/2022 12:09 PM EST ----- Regarding: doctors to fax labs for Vevelyn Pat, MD Nephrology NPI: [id number] [place] [address]   Phone: [phone] Fax: [phone]    Joie Bimler, MD  Consulting Physician, Urology FAX- [facility]  [phone]

## 2022-07-01 NOTE — Telephone Encounter (Signed)
As requested Recent labs sent to patients care team via epic routing

## 2022-07-03 ENCOUNTER — Other Ambulatory Visit (HOSPITAL_COMMUNITY): Payer: Self-pay

## 2022-07-03 NOTE — Telephone Encounter (Signed)
[facility] pharmacy called need clarification on four Rx by [place], and DM, please advise

## 2022-07-03 NOTE — Telephone Encounter (Signed)
Spoke with Nira Conn, community paramedic, she saw pt today, reports no issues with meds, he has plenty of everything

## 2022-07-03 NOTE — Progress Notes (Signed)
Paramedicine Encounter    Patient ID: Tommy Ward, male    DOB: 1940/08/25, 81 y.o.   MRN: [id number]    Arrived for home visit for [facility] where he was walking around his living room alert and oriented not appearing to be in any respiratory distress. He reports he is feeling okay this week. He went to see his PCP this morning in regards to his tremors and she advised not to follow up with neurology and to continue to treat at PCP office. No plans for treatment as of today per patient and his wife.   I obtained vitals as noted- WT- 164lbs BP- 102/58 HR- 58 O2- 96% RR- 16   Lungs clear. No lower leg edema. Denies any increased shortness of breath, dizziness or chest pain. He says he is urinating normally and has been 100% compliant with his medications over the last week. We reviewed recent labs and improvements.   I reviewed meds and confirmed same filling pill box for one week. I also reviewed upcoming appointments and confirmed same. Torsemide updated RX needs to be sent to [name].   He got a letter from [facility] Patient [facility] for med. Assistance indicating he will be re-enrolled and providers office was notified. I will follow up to ensure there is no more needed for same.   Home visit complete.   [name], [place] 07/03/2022   Patient Care Team: Serita Grammes, MD as PCP - General (Family Medicine) Debara Pickett Nadean Corwin, MD as PCP - Cardiology (Cardiology) Vickie Epley, MD as PCP - Electrophysiology (Cardiology) Marice Potter, MD as Consulting Physician (Oncology) Azzie Glatter, DDS as Referring Physician (Oral Surgery) Joie Bimler, MD as Consulting Physician (Urology)  Patient Active Problem List   Diagnosis Date Noted   Anemia due to stage 4 chronic kidney disease ([place]) 03/20/2022   Hypercalcemia of malignancy 32/07/2481   Chronic systolic heart failure ([place])    Abnormal stress test 12/05/2020   Depressed left  ventricular ejection fraction 12/05/2020   Nonrheumatic mitral valve regurgitation 12/05/2020   Pre-diabetes 12/05/2020   Pulmonary hypertension ([place]) 12/05/2020   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[place]) 12/05/2020   Tobacco use 12/05/2020   Dilated cardiomyopathy ([place]) 12/05/2020   HFrEF (heart failure with reduced ejection fraction) ([place]) 11/23/2020   CAD (coronary artery disease) 11/23/2020   GERD (gastroesophageal reflux disease)    Hyperlipidemia    Hypertension    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[place]) 06/02/2020   Malignant neoplasm of prostate ([place]) 06/02/2020   AKI (acute kidney injury) ([place]) 11/25/2019   Hyperphosphatemia 11/25/2019   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[place]) 08/24/2019   Hyperkalemia 50/10/7046   Metabolic bone disease 88/91/6945   Vitamin D deficiency 08/24/2019   Benign hypertension with chronic kidney disease, stage III ([place]) 05/20/2019   Decreased cardiac ejection fraction 05/20/2019   Benign hypertension with CKD (chronic kidney disease) stage IV ([place]) 05/20/2019   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 02/24/2019   Olecranon bursitis of left elbow 06/25/2018   B12 deficiency 03/22/2018   Gastroesophageal reflux disease without esophagitis 03/22/2018   Chronic renal insufficiency, stage III (moderate) (HCC) 11/27/2015   Aortic valve disorder 07/08/2014   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   Cataract 11/2013   Essential hypertension 07/06/2012   Coronary arteriosclerosis 07/06/2012   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   Left bundle branch block 07/06/2012  Mixed hyperlipidemia 07/06/2012   Cancer ([place]) 08/2008   S/P radiation therapy > 12 wks ago 2010    Current Outpatient Medications:    aspirin EC 81 MG tablet, Take 1 tablet (81 mg total) by mouth daily., Disp: 90 tablet, Rfl: 3    Cetirizine HCl 10 MG CAPS, Take 1 capsule by mouth daily., Disp: , Rfl:    cholestyramine light (PREVALITE) 4 g packet, Take 4 g by mouth daily., Disp: , Rfl:    dapagliflozin propanediol (FARXIGA) 10 MG TABS tablet, Take 1 tablet (10 mg total) by mouth daily before breakfast., Disp: 90 tablet, Rfl: 3   digoxin (LANOXIN) 0.125 MG tablet, Take 1 tablet (0.125 mg total) by mouth every other day., Disp: 45 tablet, Rfl: 3   enzalutamide (XTANDI) 40 MG tablet, Take 160 mg by mouth daily., Disp: , Rfl:    Evolocumab (REPATHA SURECLICK) 970 MG/ML SOAJ, INJECT 1 DOSE UNDER THE SKIN EVERY 14 DAYS, Disp: 6 mL, Rfl: 3   metoprolol succinate (TOPROL XL) 25 MG 24 hr tablet, Take 1 tablet (25 mg total) by mouth daily., Disp: 90 tablet, Rfl: 3   omeprazole (PRILOSEC) 40 MG capsule, Take 40 mg by mouth daily., Disp: , Rfl:    pentoxifylline (TRENTAL) 400 MG CR tablet, Take 400 mg by mouth 3 (three) times daily with meals., Disp: , Rfl:    pregabalin (LYRICA) 75 MG capsule, Take 75 mg by mouth 3 (three) times daily., Disp: , Rfl:    rosuvastatin (CRESTOR) 5 MG tablet, Take 5 mg by mouth daily., Disp: , Rfl:    spironolactone (ALDACTONE) 25 MG tablet, Take 25 mg by mouth daily., Disp: , Rfl:    tamsulosin (FLOMAX) 0.4 MG CAPS capsule, Take 1 capsule (0.4 mg total) by mouth daily., Disp: 30 capsule, Rfl: 0   torsemide (DEMADEX) 20 MG tablet, Alternate 60 mg with 40 mg, Disp: 180 tablet, Rfl: 3   traZODone (DESYREL) 100 MG tablet, Take 50 mg by mouth at bedtime., Disp: , Rfl:  Allergies  Allergen Reactions   Ezetimibe Other (See Comments)    Myalgia   Gabapentin Itching and Other (See Comments)    Sore throat, breakouts   Statins Other (See Comments)    Myalgias (intolerance)     Social History   Socioeconomic History   Marital status: Married    Spouse name: Not on file   Number of children: Not on file   Years of education: Not on file   Highest education level: Not on file  Occupational History    Not on file  Tobacco Use   Smoking status: Every Day    Packs/day: 0.50    Years: 50.00    Total pack years: 25.00    Types: Cigarettes   Smokeless tobacco: Never  Vaping Use   Vaping Use: Never used  Substance and Sexual Activity   Alcohol use: Not Currently   Drug use: Never   Sexual activity: Not on file  Other Topics Concern   Not on file  Social History Narrative   Not on file   Social Determinants of Health   Financial Resource Strain: Low Risk  (05/22/2022)   Overall Financial Resource Strain (CARDIA)    Difficulty of Paying Living Expenses: Not very hard  Food Insecurity: No Food Insecurity (05/22/2022)   Hunger Vital Sign    Worried About Running Out of Food in the Last Year: Never true    Ran Out of Food in the Last Year: Never true  Transportation Needs: No Transportation Needs (05/22/2022)   PRAPARE - Hydrologist (Medical): No    Lack of Transportation (Non-Medical): No  Physical Activity: Not on file  Stress: Not on file  Social Connections: Not on file  Intimate Partner Violence: Not on file    Physical Exam      Future Appointments  Date Time Provider [place]  08/22/2022 10:00 AM Marzetta Board, DPM [facility]  09/23/2022  3:40 PM [facility] DEVICE REMOTES [facility]  10/10/2022 11:45 AM Hilty, Nadean Corwin, MD [facility] None  10/11/2022  9:45 AM [facility] None  10/14/2022  9:30 AM Marice Potter, MD [facility] None  12/23/2022  3:40 PM [facility] DEVICE REMOTES [facility]  03/24/2023  3:40 PM [facility] DEVICE REMOTES [facility]  06/23/2023  3:40 PM [facility] DEVICE REMOTES [facility]  09/22/2023  3:40 PM [facility] DEVICE REMOTES [facility]     ACTION: Home visit completed

## 2022-07-10 ENCOUNTER — Other Ambulatory Visit (HOSPITAL_COMMUNITY): Payer: Self-pay

## 2022-07-10 NOTE — Progress Notes (Signed)
Paramedicine Encounter    Patient ID: Tommy Ward, male    DOB: 02/27/1941, 81 y.o.   MRN: [id number]   Arrived for home visit for [facility] who reports to be feeling okay with some fatigue and "shakes" which has been a complaint for him over the last several weeks. He reports he is urinating well and states sometimes he gets light headed but not often. I noted no lower leg edema, extended neck veins, or distended abdomen. He denied shortness of breath or weight gain. He has been 100% compliant with his medications over the last week. He says overall he feels okay and is getting around fine preforming daily tasks with no issues.   Vitals obtained.  WT- 165.4lbs  BP- 100/56 sitting  BP- 86/P standing  HR- 70 sitting  HR- 80 standing  O2- 96% RR- 16  Lungs- clear   He reports he is not hydrating as well as he should and expresses he only has a few sips of water daily and drinks one to two cups of coffee. I encouraged hydration explaining how  many ounces he is allowed daily and he admits he is drinking way under this. I advised him the dangers of too much fluid and too little.He verbalized understanding and he and his wife agree they will encourage hydration over the next week and weigh every day and use home BP machine to assess his vitals. I advised [place] and his wife [name] Pulse if any symptoms worsen or he feels he needs further evaluation to reach out or seek ER care if needed. They both verbalized understanding.   I reviewed meds and confirmed same filling pill box for one week. Refills called in as noted: Pregabalin Tamsulosin   Appointments confirmed and reviewed. Home visit complete. I will see Amel in one week. He agreed with plan and knows to call me  if needed in the mean time.   [name], [place] 07/10/2022     Patient Care Team: Serita Grammes, MD as PCP - General (Family Medicine) Debara Pickett Nadean Corwin, MD as PCP - Cardiology (Cardiology) Vickie Epley, MD as PCP - Electrophysiology (Cardiology) Marice Potter, MD as Consulting Physician (Oncology) Azzie Glatter, DDS as Referring Physician (Oral Surgery) Joie Bimler, MD as Consulting Physician (Urology)  Patient Active Problem List   Diagnosis Date Noted   Anemia due to stage 4 chronic kidney disease ([place]) 03/20/2022   Hypercalcemia of malignancy 63/08/6008   Chronic systolic heart failure ([place])    Abnormal stress test 12/05/2020   Depressed left ventricular ejection fraction 12/05/2020   Nonrheumatic mitral valve regurgitation 12/05/2020   Pre-diabetes 12/05/2020   Pulmonary hypertension ([place]) 12/05/2020   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[place]) 12/05/2020   Tobacco use 12/05/2020   Dilated cardiomyopathy ([place]) 12/05/2020   HFrEF (heart failure with reduced ejection fraction) ([place]) 11/23/2020   CAD (coronary artery disease) 11/23/2020   GERD (gastroesophageal reflux disease)    Hyperlipidemia    Hypertension    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[place]) 06/02/2020   Malignant neoplasm of prostate ([place]) 06/02/2020   AKI (acute kidney injury) ([place]) 11/25/2019   Hyperphosphatemia 11/25/2019   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[place]) 08/24/2019   Hyperkalemia 93/23/5573   Metabolic bone disease 22/09/5425   Vitamin D deficiency 08/24/2019   Benign hypertension with chronic kidney disease, stage III ([place]) 05/20/2019   Decreased cardiac ejection fraction 05/20/2019   Benign  hypertension with CKD (chronic kidney disease) stage IV ([place]) 05/20/2019   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 02/24/2019   Olecranon bursitis of left elbow 06/25/2018   B12 deficiency 03/22/2018   Gastroesophageal reflux disease without esophagitis 03/22/2018   Chronic renal insufficiency, stage III (moderate) (HCC) 11/27/2015   Aortic valve disorder 07/08/2014    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   Cataract 11/2013   Essential hypertension 07/06/2012   Coronary arteriosclerosis 07/06/2012   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   Left bundle branch block 07/06/2012   Mixed hyperlipidemia 07/06/2012   Cancer ([place]) 08/2008   S/P radiation therapy > 12 wks ago 2010    Current Outpatient Medications:    aspirin EC 81 MG tablet, Take 1 tablet (81 mg total) by mouth daily., Disp: 90 tablet, Rfl: 3   Cetirizine HCl 10 MG CAPS, Take 1 capsule by mouth daily., Disp: , Rfl:    cholestyramine light (PREVALITE) 4 g packet, Take 4 g by mouth daily., Disp: , Rfl:    dapagliflozin propanediol (FARXIGA) 10 MG TABS tablet, Take 1 tablet (10 mg total) by mouth daily before breakfast., Disp: 90 tablet, Rfl: 3   digoxin (LANOXIN) 0.125 MG tablet, Take 1 tablet (0.125 mg total) by mouth every other day., Disp: 45 tablet, Rfl: 3   enzalutamide (XTANDI) 40 MG tablet, Take 160 mg by mouth daily., Disp: , Rfl:    Evolocumab (REPATHA SURECLICK) 193 MG/ML SOAJ, INJECT 1 DOSE UNDER THE SKIN EVERY 14 DAYS, Disp: 6 mL, Rfl: 3   metoprolol succinate (TOPROL XL) 25 MG 24 hr tablet, Take 1 tablet (25 mg total) by mouth daily., Disp: 90 tablet, Rfl: 3   omeprazole (PRILOSEC) 40 MG capsule, Take 40 mg by mouth daily., Disp: , Rfl:    pentoxifylline (TRENTAL) 400 MG CR tablet, Take 400 mg by mouth 3 (three) times daily with meals., Disp: , Rfl:    pregabalin (LYRICA) 75 MG capsule, Take 75 mg by mouth 3 (three) times daily., Disp: , Rfl:    rosuvastatin (CRESTOR) 5 MG tablet, Take 5 mg by mouth daily., Disp: , Rfl:    spironolactone (ALDACTONE) 25 MG tablet, Take 25 mg by mouth daily., Disp: , Rfl:    tamsulosin (FLOMAX) 0.4 MG CAPS capsule, Take 1 capsule (0.4 mg total) by mouth daily., Disp: 30 capsule, Rfl: 0   torsemide (DEMADEX) 20 MG tablet, Alternate 60 mg with 40 mg, Disp: 180 tablet, Rfl: 3   traZODone (DESYREL) 100 MG tablet, Take 50 mg by  mouth at bedtime., Disp: , Rfl:  Allergies  Allergen Reactions   Ezetimibe Other (See Comments)    Myalgia   Gabapentin Itching and Other (See Comments)    Sore throat, breakouts   Statins Other (See Comments)    Myalgias (intolerance)     Social History   Socioeconomic History   Marital status: Married    Spouse name: Not on file   Number of children: Not on file   Years of education: Not on file   Highest education level: Not on file  Occupational History   Not on file  Tobacco Use   Smoking status: Every Day    Packs/day: 0.50    Years: 50.00    Total pack years: 25.00    Types: Cigarettes   Smokeless tobacco: Never  Vaping Use   Vaping Use: Never used  Substance and Sexual Activity   Alcohol use: Not Currently   Drug use:  Never   Sexual activity: Not on file  Other Topics Concern   Not on file  Social History Narrative   Not on file   Social Determinants of Health   Financial Resource Strain: Low Risk  (05/22/2022)   Overall Financial Resource Strain (CARDIA)    Difficulty of Paying Living Expenses: Not very hard  Food Insecurity: No Food Insecurity (05/22/2022)   Hunger Vital Sign    Worried About Running Out of Food in the Last Year: Never true    Ran Out of Food in the Last Year: Never true  Transportation Needs: No Transportation Needs (05/22/2022)   PRAPARE - Hydrologist (Medical): No    Lack of Transportation (Non-Medical): No  Physical Activity: Not on file  Stress: Not on file  Social Connections: Not on file  Intimate Partner Violence: Not on file    Physical Exam      Future Appointments  Date Time Provider [place]  08/22/2022 10:00 AM Marzetta Board, DPM [facility]  09/23/2022  3:40 PM [facility] DEVICE REMOTES [facility]  10/10/2022 11:45 AM Hilty, Nadean Corwin, MD [facility] None  10/11/2022  9:45 AM [facility] None  10/14/2022  9:30 AM Marice Potter, MD  [facility] None  12/23/2022  3:40 PM [facility] DEVICE REMOTES [facility]  03/24/2023  3:40 PM [facility] DEVICE REMOTES [facility]  06/23/2023  3:40 PM [facility] DEVICE REMOTES [facility]  09/22/2023  3:40 PM [facility] DEVICE REMOTES [facility]     ACTION: Home visit completed

## 2022-07-17 ENCOUNTER — Other Ambulatory Visit (HOSPITAL_COMMUNITY): Payer: Self-pay

## 2022-07-17 NOTE — Progress Notes (Signed)
Paramedicine Encounter    Patient ID: Tommy Ward, male    DOB: Jan 11, 1941, 81 y.o.   MRN: [id number]  Arrived for home visit for [facility] who reports to be feeling well today other than some feet pain which is chronic for him. He denied any increased dizziness, denied chest pain or shortness of breath. No lower leg swelling or weight gain. He was 100% compliant with his meds over the last week. He did explain he had one episode of dizziness where he was on the floor reaching for a remote that fell and he went to stand and got light headed, he said he sat by the bed and it subsided moments later but he denied feeling as if he was going to pass out. He has been urinating frequently, he did say he increased his hydration over the last week and is feeling better. I obtained vitals as noted:  WT- 164lbs BP- 98/60 HR- 70 O2- 97% RR- 16  Lungs- clear Swelling- none   I reviewed meds and confirmed same filling pill box for one week. I called in speciality medications including Wilder Glade and Xtandi.   Tamsulosin and Pregabalin enroute from [name]: Tamsulosin needed in pill box Friday, Saturday, Sunday, Monday, Tuesday evening. He will fill it once delivered.   Appointments reviewed and confirmed.   Home visit complete.  I will see Tommy Ward in one week.   [name], [place] 07/17/2022      Patient Care Team: Serita Grammes, MD as PCP - General (Family Medicine) Debara Pickett Nadean Corwin, MD as PCP - Cardiology (Cardiology) Vickie Epley, MD as PCP - Electrophysiology (Cardiology) Marice Potter, MD as Consulting Physician (Oncology) Azzie Glatter, DDS as Referring Physician (Oral Surgery) Joie Bimler, MD as Consulting Physician (Urology)  Patient Active Problem List   Diagnosis Date Noted   Anemia due to stage 4 chronic kidney disease ([place]) 03/20/2022   Hypercalcemia of malignancy 64/33/2951   Chronic systolic heart failure ([place])    Abnormal  stress test 12/05/2020   Depressed left ventricular ejection fraction 12/05/2020   Nonrheumatic mitral valve regurgitation 12/05/2020   Pre-diabetes 12/05/2020   Pulmonary hypertension ([place]) 12/05/2020   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[place]) 12/05/2020   Tobacco use 12/05/2020   Dilated cardiomyopathy ([place]) 12/05/2020   HFrEF (heart failure with reduced ejection fraction) ([place]) 11/23/2020   CAD (coronary artery disease) 11/23/2020   GERD (gastroesophageal reflux disease)    Hyperlipidemia    Hypertension    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[place]) 06/02/2020   Malignant neoplasm of prostate ([place]) 06/02/2020   AKI (acute kidney injury) ([place]) 11/25/2019   Hyperphosphatemia 11/25/2019   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[place]) 08/24/2019   Hyperkalemia 88/41/6606   Metabolic bone disease 30/16/0109   Vitamin D deficiency 08/24/2019   Benign hypertension with chronic kidney disease, stage III ([place]) 05/20/2019   Decreased cardiac ejection fraction 05/20/2019   Benign hypertension with CKD (chronic kidney disease) stage IV ([place]) 05/20/2019   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 02/24/2019   Olecranon bursitis of left elbow 06/25/2018   B12 deficiency 03/22/2018   Gastroesophageal reflux disease without esophagitis 03/22/2018   Chronic renal insufficiency, stage III (moderate) (HCC) 11/27/2015   Aortic valve disorder 07/08/2014   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   Cataract 11/2013   Essential hypertension 07/06/2012   Coronary arteriosclerosis 07/06/2012   Hypertensive heart disease without congestive  heart failure 07/06/2012   Left bundle branch block 07/06/2012   Mixed hyperlipidemia 07/06/2012   Cancer ([place]) 08/2008   S/P radiation therapy > 12 wks ago 2010    Current Outpatient Medications:    aspirin EC 81 MG tablet, Take 1 tablet (81 mg total) by  mouth daily., Disp: 90 tablet, Rfl: 3   Cetirizine HCl 10 MG CAPS, Take 1 capsule by mouth daily., Disp: , Rfl:    cholestyramine light (PREVALITE) 4 g packet, Take 4 g by mouth daily., Disp: , Rfl:    dapagliflozin propanediol (FARXIGA) 10 MG TABS tablet, Take 1 tablet (10 mg total) by mouth daily before breakfast., Disp: 90 tablet, Rfl: 3   digoxin (LANOXIN) 0.125 MG tablet, Take 1 tablet (0.125 mg total) by mouth every other day., Disp: 45 tablet, Rfl: 3   enzalutamide (XTANDI) 40 MG tablet, Take 160 mg by mouth daily., Disp: , Rfl:    Evolocumab (REPATHA SURECLICK) 106 MG/ML SOAJ, INJECT 1 DOSE UNDER THE SKIN EVERY 14 DAYS, Disp: 6 mL, Rfl: 3   metoprolol succinate (TOPROL XL) 25 MG 24 hr tablet, Take 1 tablet (25 mg total) by mouth daily., Disp: 90 tablet, Rfl: 3   omeprazole (PRILOSEC) 40 MG capsule, Take 40 mg by mouth daily., Disp: , Rfl:    pentoxifylline (TRENTAL) 400 MG CR tablet, Take 400 mg by mouth 3 (three) times daily with meals., Disp: , Rfl:    pregabalin (LYRICA) 75 MG capsule, Take 75 mg by mouth 3 (three) times daily., Disp: , Rfl:    rosuvastatin (CRESTOR) 5 MG tablet, Take 5 mg by mouth daily., Disp: , Rfl:    spironolactone (ALDACTONE) 25 MG tablet, Take 25 mg by mouth daily., Disp: , Rfl:    tamsulosin (FLOMAX) 0.4 MG CAPS capsule, Take 1 capsule (0.4 mg total) by mouth daily., Disp: 30 capsule, Rfl: 0   torsemide (DEMADEX) 20 MG tablet, Alternate 60 mg with 40 mg, Disp: 180 tablet, Rfl: 3   traZODone (DESYREL) 100 MG tablet, Take 50 mg by mouth at bedtime., Disp: , Rfl:  Allergies  Allergen Reactions   Ezetimibe Other (See Comments)    Myalgia   Gabapentin Itching and Other (See Comments)    Sore throat, breakouts   Statins Other (See Comments)    Myalgias (intolerance)     Social History   Socioeconomic History   Marital status: Married    Spouse name: Not on file   Number of children: Not on file   Years of education: Not on file   Highest education  level: Not on file  Occupational History   Not on file  Tobacco Use   Smoking status: Every Day    Packs/day: 0.50    Years: 50.00    Total pack years: 25.00    Types: Cigarettes   Smokeless tobacco: Never  Vaping Use   Vaping Use: Never used  Substance and Sexual Activity   Alcohol use: Not Currently   Drug use: Never   Sexual activity: Not on file  Other Topics Concern   Not on file  Social History Narrative   Not on file   Social Determinants of Health   Financial Resource Strain: Low Risk  (05/22/2022)   Overall Financial Resource Strain (CARDIA)    Difficulty of Paying Living Expenses: Not very hard  Food Insecurity: No Food Insecurity (05/22/2022)   Hunger Vital Sign    Worried About Running Out of Food in the Last Year: Never true  Ran Out of Food in the Last Year: Never true  Transportation Needs: No Transportation Needs (05/22/2022)   PRAPARE - Hydrologist (Medical): No    Lack of Transportation (Non-Medical): No  Physical Activity: Not on file  Stress: Not on file  Social Connections: Not on file  Intimate Partner Violence: Not on file    Physical Exam      Future Appointments  Date Time Provider [place]  08/22/2022 10:00 AM Marzetta Board, DPM [facility]  09/23/2022  3:40 PM [facility] DEVICE REMOTES [facility]  10/10/2022 11:45 AM Hilty, Nadean Corwin, MD [facility] None  10/11/2022  9:45 AM [facility] None  10/14/2022  9:30 AM Marice Potter, MD [facility] None  12/23/2022  3:40 PM [facility] DEVICE REMOTES [facility]  03/24/2023  3:40 PM [facility] DEVICE REMOTES [facility]  06/23/2023  3:40 PM [facility] DEVICE REMOTES [facility]  09/22/2023  3:40 PM [facility] DEVICE REMOTES [facility]     ACTION: Home visit completed

## 2022-07-22 ENCOUNTER — Telehealth (HOSPITAL_COMMUNITY): Payer: Self-pay

## 2022-07-22 NOTE — Telephone Encounter (Signed)
Advanced Heart Failure Patient Advocate Encounter  Medication Samples have been provided to patient directly Drug name: Farxiga '10MG'$  Qty: 2x (7 ct) package LOT: [id number] Exp.: 05/27 SIG: Take 1 tablet by mouth once daily   The patient has been instructed regarding the correct time, dose, and frequency of taking this medication, including desired effects and most common side effects.   Clista Bernhardt, CPhT Rx Patient Advocate Phone: [phone]

## 2022-07-23 NOTE — Progress Notes (Signed)
Tried calling patient, wanted to speak with him about re-enrolling for free Xtandi. Reached his voicemail, could not leave a message, voicemail was full.

## 2022-07-24 ENCOUNTER — Other Ambulatory Visit (HOSPITAL_COMMUNITY): Payer: Self-pay

## 2022-07-24 DIAGNOSIS — T452X1A Poisoning by vitamins, accidental (unintentional), initial encounter: Secondary | ICD-10-CM

## 2022-07-24 HISTORY — DX: Poisoning by vitamins, accidental (unintentional), initial encounter: T45.2X1A

## 2022-07-24 NOTE — Progress Notes (Signed)
Paramedicine Encounter    Patient ID: Tommy Ward, male    DOB: [birth year], 81 y.o.   MRN: [id number]  Arrived for home visit for [facility] who reports to be feeling good today with no complaints of dizziness, chest pain shortness of breath or leg swelling/weight gain. He stated that he feels his dizziness has improved since increasing his hydration over the last few weeks. He was seen by kidney doctor this morning and the following labs were obtained:  Potassium- 4.9 Sodium- 139 Chloride- 101 BUN- 33 Creatinine- 2.15 Anion Gap- 9   (These are improvements since his last labs)   I reviewed meds and confirmed same I filled pill box for one week. Refills as noted and called in to [facility] in [place]- Pentoxifylline #[id number]   NO VITALS RECORDED AS HE WAS SEEN IN DOCTORS OFFICE TODAY. WIFE REPORTS VITALS WERE WITHIN NORMAL RANGE.   Appointments reviewed and confirmed.   Home visit complete.   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[place]) 03/20/2022   Hypercalcemia of malignancy 87/56/4332   Chronic systolic heart failure ([place])    Abnormal stress test 12/05/2020   Depressed left ventricular ejection fraction 12/05/2020   Nonrheumatic mitral valve regurgitation 12/05/2020   Pre-diabetes 12/05/2020   Pulmonary hypertension ([place]) 12/05/2020   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[place]) 12/05/2020   Tobacco use 12/05/2020   Dilated cardiomyopathy ([place])  12/05/2020   HFrEF (heart failure with reduced ejection fraction) ([place]) 11/23/2020   CAD (coronary artery disease) 11/23/2020   GERD (gastroesophageal reflux disease)    Hyperlipidemia    Hypertension    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[place]) 06/02/2020   Malignant neoplasm of prostate ([place]) 06/02/2020   AKI (acute kidney injury) ([place]) 11/25/2019   Hyperphosphatemia 11/25/2019   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[place]) 08/24/2019   Hyperkalemia 95/18/8416   Metabolic bone disease 60/63/0160   Vitamin D deficiency 08/24/2019   Benign hypertension with chronic kidney disease, stage III ([place]) 05/20/2019   Decreased cardiac ejection fraction 05/20/2019   Benign hypertension with CKD (chronic kidney disease) stage IV ([place]) 05/20/2019   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 02/24/2019   Olecranon bursitis of left elbow 06/25/2018   B12 deficiency 03/22/2018   Gastroesophageal reflux disease without esophagitis 03/22/2018   Chronic renal insufficiency, stage III (moderate) (HCC) 11/27/2015   Aortic valve disorder 07/08/2014   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   Cataract 11/2013   Essential hypertension 07/06/2012   Coronary arteriosclerosis 07/06/2012   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   Left bundle branch block 07/06/2012   Mixed hyperlipidemia 07/06/2012   Cancer ([place]) 08/2008   S/P radiation therapy > 12 wks ago 2010    Current Outpatient Medications:    aspirin EC 81 MG tablet, Take 1 tablet (81 mg total) by mouth daily., Disp: 90 tablet, Rfl: 3   Cetirizine HCl 10 MG CAPS, Take 1 capsule by mouth daily., Disp: , Rfl:  cholestyramine light (PREVALITE) 4 g packet, Take 4 g by mouth daily., Disp: , Rfl:    dapagliflozin propanediol (FARXIGA) 10 MG TABS tablet, Take 1 tablet (10 mg total) by mouth daily before breakfast., Disp: 90 tablet, Rfl: 3   digoxin (LANOXIN)  0.125 MG tablet, Take 1 tablet (0.125 mg total) by mouth every other day., Disp: 45 tablet, Rfl: 3   enzalutamide (XTANDI) 40 MG tablet, Take 160 mg by mouth daily., Disp: , Rfl:    Evolocumab (REPATHA SURECLICK) 664 MG/ML SOAJ, INJECT 1 DOSE UNDER THE SKIN EVERY 14 DAYS, Disp: 6 mL, Rfl: 3   metoprolol succinate (TOPROL XL) 25 MG 24 hr tablet, Take 1 tablet (25 mg total) by mouth daily., Disp: 90 tablet, Rfl: 3   omeprazole (PRILOSEC) 40 MG capsule, Take 40 mg by mouth daily., Disp: , Rfl:    pentoxifylline (TRENTAL) 400 MG CR tablet, Take 400 mg by mouth 3 (three) times daily with meals., Disp: , Rfl:    pregabalin (LYRICA) 75 MG capsule, Take 75 mg by mouth 3 (three) times daily., Disp: , Rfl:    rosuvastatin (CRESTOR) 5 MG tablet, Take 5 mg by mouth daily., Disp: , Rfl:    spironolactone (ALDACTONE) 25 MG tablet, Take 25 mg by mouth daily., Disp: , Rfl:    tamsulosin (FLOMAX) 0.4 MG CAPS capsule, Take 1 capsule (0.4 mg total) by mouth daily., Disp: 30 capsule, Rfl: 0   torsemide (DEMADEX) 20 MG tablet, Alternate 60 mg with 40 mg, Disp: 180 tablet, Rfl: 3   traZODone (DESYREL) 100 MG tablet, Take 50 mg by mouth at bedtime., Disp: , Rfl:  Allergies  Allergen Reactions   Ezetimibe Other (See Comments)    Myalgia   Gabapentin Itching and Other (See Comments)    Sore throat, breakouts   Statins Other (See Comments)    Myalgias (intolerance)     Social History   Socioeconomic History   Marital status: Married    Spouse name: Not on file   Number of children: Not on file   Years of education: Not on file   Highest education level: Not on file  Occupational History   Not on file  Tobacco Use   Smoking status: Every Day    Packs/day: 0.50    Years: 50.00    Total pack years: 25.00    Types: Cigarettes   Smokeless tobacco: Never  Vaping Use   Vaping Use: Never used  Substance and Sexual Activity   Alcohol use: Not Currently   Drug use: Never   Sexual activity: Not on file   Other Topics Concern   Not on file  Social History Narrative   Not on file   Social Determinants of Health   Financial Resource Strain: Low Risk  (05/22/2022)   Overall Financial Resource Strain (CARDIA)    Difficulty of Paying Living Expenses: Not very hard  Food Insecurity: No Food Insecurity (05/22/2022)   Hunger Vital Sign    Worried About Running Out of Food in the Last Year: Never true    Ran Out of Food in the Last Year: Never true  Transportation Needs: No Transportation Needs (05/22/2022)   PRAPARE - Hydrologist (Medical): No    Lack of Transportation (Non-Medical): No  Physical Activity: Not on file  Stress: Not on file  Social Connections: Not on file  Intimate Partner Violence: Not on file    Physical Exam      Future Appointments  Date Time Provider [place]  08/22/2022 10:00 AM Marzetta Board, DPM [facility]  09/23/2022  3:40 PM [facility] DEVICE REMOTES [facility]  10/10/2022 11:45 AM Hilty, Nadean Corwin, MD [facility] None  10/11/2022  9:45 AM [facility] None  10/14/2022  9:30 AM Marice Potter, MD [facility] None  12/23/2022  3:40 PM [facility] DEVICE REMOTES [facility]  03/24/2023  3:40 PM [facility] DEVICE REMOTES [facility]  06/23/2023  3:40 PM [facility] DEVICE REMOTES [facility]  09/22/2023  3:40 PM [facility] DEVICE REMOTES [facility]     ACTION: Home visit completed

## 2022-07-29 ENCOUNTER — Other Ambulatory Visit (HOSPITAL_COMMUNITY): Payer: Self-pay

## 2022-07-31 ENCOUNTER — Other Ambulatory Visit (HOSPITAL_COMMUNITY): Payer: Self-pay

## 2022-07-31 ENCOUNTER — Encounter: Payer: Self-pay | Admitting: Oncology

## 2022-07-31 ENCOUNTER — Telehealth (HOSPITAL_COMMUNITY): Payer: Self-pay

## 2022-07-31 ENCOUNTER — Other Ambulatory Visit (HOSPITAL_COMMUNITY): Payer: Self-pay | Admitting: *Deleted

## 2022-07-31 MED ORDER — DAPAGLIFLOZIN PROPANEDIOL 10 MG PO TABS: 10.0000 mg | ORAL_TABLET | Freq: Every day | ORAL | 3 refills | Status: DC

## 2022-07-31 NOTE — Progress Notes (Signed)
Remote ICD transmission.   

## 2022-07-31 NOTE — Telephone Encounter (Signed)
Advanced Heart Failure Patient Advocate Encounter  The patient was approved for a [employer] grant that will help cover the cost of Farxiga.  Total amount awarded, $10,000.  Effective: 07/01/22 - 07/01/23.  BIN [id number] PCN [id number] [id number] ID [id number]  New prescription(s) sent to [name], including billing for [place]. Patient provided with approval and processing information via [employer]PS.  Clista Bernhardt, CPhT Rx Patient Advocate Phone: [phone]

## 2022-07-31 NOTE — Progress Notes (Signed)
Paramedicine Encounter    Patient ID: Tommy Ward, male    DOB: 1941-08-18, 81 y.o.   MRN: [id number]   Arrived for home visit for [facility] who reports to be feeling good today with some pain from his neuropathy and arthritis but otherwise fine. He denied shortness of breath, dizziness, chest pain, swelling or weight gain. I obtained vitals and assessment as noted:  WT- 161lbs BP- 110/68 HR-70 RR-16 O2- 96% Lungs-clear Edema- none  Meds reviewed and noted that he was compliant with all meds over the last week except one night time dose. He reports he fell asleep before he remembered to take them.   I filled pill box for one week.   No refills needed at this time.   He has been adhering to diet and PO fluid suggestions and seems to be feeling less dizzy.   Appointments reviewed and confirmed.   Home visit complete.   [name], [place] 07/31/2022      Patient Care Team: Serita Grammes, MD as PCP - General (Family Medicine) Debara Pickett Nadean Corwin, MD as PCP - Cardiology (Cardiology) Vickie Epley, MD as PCP - Electrophysiology (Cardiology) Marice Potter, MD as Consulting Physician (Oncology) Azzie Glatter, DDS as Referring Physician (Oral Surgery) Joie Bimler, MD as Consulting Physician (Urology)  Patient Active Problem List   Diagnosis Date Noted   Anemia due to stage 4 chronic kidney disease ([place]) 03/20/2022   Hypercalcemia of malignancy 47/04/6282   Chronic systolic heart failure ([place])    Abnormal stress test 12/05/2020   Depressed left ventricular ejection fraction 12/05/2020   Nonrheumatic mitral valve regurgitation 12/05/2020   Pre-diabetes 12/05/2020   Pulmonary hypertension ([place]) 12/05/2020   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[place]) 12/05/2020   Tobacco use 12/05/2020   Dilated cardiomyopathy ([place]) 12/05/2020   HFrEF (heart failure with reduced ejection fraction) ([place])  11/23/2020   CAD (coronary artery disease) 11/23/2020   GERD (gastroesophageal reflux disease)    Hyperlipidemia    Hypertension    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[place]) 06/02/2020   Malignant neoplasm of prostate ([place]) 06/02/2020   AKI (acute kidney injury) ([place]) 11/25/2019   Hyperphosphatemia 11/25/2019   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[place]) 08/24/2019   Hyperkalemia 66/29/4765   Metabolic bone disease 46/50/3546   Vitamin D deficiency 08/24/2019   Benign hypertension with chronic kidney disease, stage III ([place]) 05/20/2019   Decreased cardiac ejection fraction 05/20/2019   Benign hypertension with CKD (chronic kidney disease) stage IV ([place]) 05/20/2019   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 02/24/2019   Olecranon bursitis of left elbow 06/25/2018   B12 deficiency 03/22/2018   Gastroesophageal reflux disease without esophagitis 03/22/2018   Chronic renal insufficiency, stage III (moderate) (HCC) 11/27/2015   Aortic valve disorder 07/08/2014   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   Cataract 11/2013   Essential hypertension 07/06/2012   Coronary arteriosclerosis 07/06/2012   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   Left bundle branch block 07/06/2012   Mixed hyperlipidemia 07/06/2012   Cancer ([place]) 08/2008   S/P radiation therapy > 12 wks ago 2010    Current Outpatient Medications:    aspirin EC 81 MG tablet, Take 1 tablet (81 mg total) by mouth daily., Disp: 90 tablet, Rfl: 3   Cetirizine HCl 10 MG CAPS, Take 1 capsule by mouth daily., Disp: , Rfl:    cholestyramine light (PREVALITE) 4  g packet, Take 4 g by mouth daily., Disp: , Rfl:    dapagliflozin propanediol (FARXIGA) 10 MG TABS tablet, Take 1 tablet (10 mg total) by mouth daily before breakfast. Pharmacy note: please split bill Farxiga to [place]: [id number] RX PCN: [id number]: [id number] RX ID: [id number],  Disp: 90 tablet, Rfl: 3   digoxin (LANOXIN) 0.125 MG tablet, Take 1 tablet (0.125 mg total) by mouth every other day., Disp: 45 tablet, Rfl: 3   enzalutamide (XTANDI) 40 MG tablet, Take 160 mg by mouth daily., Disp: , Rfl:    Evolocumab (REPATHA SURECLICK) 619 MG/ML SOAJ, INJECT 1 DOSE UNDER THE SKIN EVERY 14 DAYS, Disp: 6 mL, Rfl: 3   metoprolol succinate (TOPROL XL) 25 MG 24 hr tablet, Take 1 tablet (25 mg total) by mouth daily., Disp: 90 tablet, Rfl: 3   omeprazole (PRILOSEC) 40 MG capsule, Take 40 mg by mouth daily., Disp: , Rfl:    pentoxifylline (TRENTAL) 400 MG CR tablet, Take 400 mg by mouth 3 (three) times daily with meals., Disp: , Rfl:    pregabalin (LYRICA) 75 MG capsule, Take 75 mg by mouth 3 (three) times daily., Disp: , Rfl:    rosuvastatin (CRESTOR) 5 MG tablet, Take 5 mg by mouth daily., Disp: , Rfl:    spironolactone (ALDACTONE) 25 MG tablet, Take 25 mg by mouth daily., Disp: , Rfl:    tamsulosin (FLOMAX) 0.4 MG CAPS capsule, Take 1 capsule (0.4 mg total) by mouth daily., Disp: 30 capsule, Rfl: 0   torsemide (DEMADEX) 20 MG tablet, Alternate 60 mg with 40 mg, Disp: 180 tablet, Rfl: 3   traZODone (DESYREL) 100 MG tablet, Take 50 mg by mouth at bedtime., Disp: , Rfl:  Allergies  Allergen Reactions   Ezetimibe Other (See Comments)    Myalgia   Gabapentin Itching and Other (See Comments)    Sore throat, breakouts   Statins Other (See Comments)    Myalgias (intolerance)     Social History   Socioeconomic History   Marital status: Married    Spouse name: Not on file   Number of children: Not on file   Years of education: Not on file   Highest education level: Not on file  Occupational History   Not on file  Tobacco Use   Smoking status: Every Day    Packs/day: 0.50    Years: 50.00    Total pack years: 25.00    Types: Cigarettes   Smokeless tobacco: Never  Vaping Use   Vaping Use: Never used  Substance and Sexual Activity   Alcohol use: Not Currently   Drug  use: Never   Sexual activity: Not on file  Other Topics Concern   Not on file  Social History Narrative   Not on file   Social Determinants of Health   Financial Resource Strain: Low Risk  (05/22/2022)   Overall Financial Resource Strain (CARDIA)    Difficulty of Paying Living Expenses: Not very hard  Food Insecurity: No Food Insecurity (05/22/2022)   Hunger Vital Sign    Worried About Running Out of Food in the Last Year: Never true    Ran Out of Food in the Last Year: Never true  Transportation Needs: No Transportation Needs (05/22/2022)   PRAPARE - Hydrologist (Medical): No    Lack of Transportation (Non-Medical): No  Physical Activity: Not on file  Stress: Not on file  Social Connections: Not on file  Intimate Partner Violence:  Not on file    Physical Exam      Future Appointments  Date Time Provider [place]  08/22/2022 10:00 AM Marzetta Board, DPM [facility]  09/23/2022  3:40 PM [facility] DEVICE REMOTES [facility]  10/10/2022 11:45 AM Hilty, Nadean Corwin, MD [facility] None  10/11/2022  9:45 AM [facility] None  10/14/2022  9:30 AM Marice Potter, MD [facility] None  12/23/2022  3:40 PM [facility] DEVICE REMOTES [facility]  03/24/2023  3:40 PM [facility] DEVICE REMOTES [facility]  06/23/2023  3:40 PM [facility] DEVICE REMOTES [facility]  09/22/2023  3:40 PM [facility] DEVICE REMOTES [facility]     ACTION: Home visit completed

## 2022-08-05 NOTE — Progress Notes (Signed)
Faxed in application to [employer] [facility] for free medication.

## 2022-08-06 ENCOUNTER — Telehealth: Payer: Self-pay | Admitting: Internal Medicine

## 2022-08-06 NOTE — Telephone Encounter (Signed)
PA for repatha submitted via CMM (Key: [id number])

## 2022-08-07 ENCOUNTER — Other Ambulatory Visit (HOSPITAL_COMMUNITY): Payer: Self-pay

## 2022-08-07 NOTE — Progress Notes (Signed)
Paramedicine Encounter    Patient ID: Tommy Ward, male    DOB: [birth year], 81 y.o.   MRN: [id number]  Arrived for home visit for [facility] who reports to be feeling good today. 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 He denied shortness of breath, dizziness, chest pain or swelling over the last week. He was compliant with all meds except one morning dose on Saturday.   I discussed med compliance with him and reviewed meds. I filled pill box for one week.   No refills needed.   Vitals obtained: WT- 164lbs BP- 140/68 HR- 70 O2- 93% RR-16  Lungs- clear Edema- none   [employer] called and verified repatha was approved.   Heart failure management, med compliance, diet discussed. Home visit complete. I will see Tommy Ward in one week.   [name], [place] 08/07/2022     Patient Care Team: Serita Grammes, MD as PCP - General (Family Medicine) Debara Pickett Nadean Corwin, MD as PCP - Cardiology (Cardiology) Vickie Epley, MD as PCP - Electrophysiology (Cardiology) Marice Potter, MD as Consulting Physician (Oncology) Azzie Glatter, DDS as Referring Physician (Oral Surgery) Joie Bimler, MD as Consulting Physician (Urology)  Patient Active Problem List   Diagnosis Date Noted   Anemia due to stage 4 chronic kidney disease ([place]) 03/20/2022   Hypercalcemia of malignancy 17/40/8144   Chronic systolic heart failure ([place])    Abnormal stress test 12/05/2020   Depressed left ventricular ejection fraction 12/05/2020   Nonrheumatic mitral valve regurgitation 12/05/2020   Pre-diabetes 12/05/2020   Pulmonary hypertension ([place]) 12/05/2020   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[place]) 12/05/2020   Tobacco use 12/05/2020   Dilated cardiomyopathy ([place]) 12/05/2020   HFrEF (heart failure with reduced ejection fraction) ([place]) 11/23/2020   CAD (coronary artery disease) 11/23/2020    GERD (gastroesophageal reflux disease)    Hyperlipidemia    Hypertension    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[place]) 06/02/2020   Malignant neoplasm of prostate ([place]) 06/02/2020   AKI (acute kidney injury) ([place]) 11/25/2019   Hyperphosphatemia 11/25/2019   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[place]) 08/24/2019   Hyperkalemia 81/85/6314   Metabolic bone disease 97/09/6376   Vitamin D deficiency 08/24/2019   Benign hypertension with chronic kidney disease, stage III ([place]) 05/20/2019   Decreased cardiac ejection fraction 05/20/2019   Benign hypertension with CKD (chronic kidney disease) stage IV ([place]) 05/20/2019   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 02/24/2019   Olecranon bursitis of left elbow 06/25/2018   B12 deficiency 03/22/2018   Gastroesophageal reflux disease without esophagitis 03/22/2018   Chronic renal insufficiency, stage III (moderate) (HCC) 11/27/2015   Aortic valve disorder 07/08/2014   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   Cataract 11/2013   Essential hypertension 07/06/2012   Coronary arteriosclerosis 07/06/2012   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   Left bundle branch block 07/06/2012   Mixed hyperlipidemia 07/06/2012   Cancer ([place]) 08/2008   S/P radiation therapy > 12 wks ago 2010    Current Outpatient Medications:    aspirin EC 81 MG tablet, Take 1 tablet (81 mg total) by mouth daily., Disp: 90 tablet, Rfl: 3   Cetirizine HCl 10 MG CAPS, Take 1 capsule by mouth daily., Disp: , Rfl:    cholestyramine light (PREVALITE) 4 g packet, Take 4 g by mouth daily., Disp: , Rfl:  dapagliflozin propanediol (FARXIGA) 10 MG TABS tablet, Take 1 tablet (10 mg total) by mouth daily before breakfast. Pharmacy note: please split bill Farxiga to [place]: [id number] RX PCN: [id number]: [id number] RX ID: [id number], Disp: 90 tablet, Rfl: 3   digoxin (LANOXIN) 0.125 MG  tablet, Take 1 tablet (0.125 mg total) by mouth every other day., Disp: 45 tablet, Rfl: 3   enzalutamide (XTANDI) 40 MG tablet, Take 160 mg by mouth daily., Disp: , Rfl:    Evolocumab (REPATHA SURECLICK) 163 MG/ML SOAJ, INJECT 1 DOSE UNDER THE SKIN EVERY 14 DAYS, Disp: 6 mL, Rfl: 3   metoprolol succinate (TOPROL XL) 25 MG 24 hr tablet, Take 1 tablet (25 mg total) by mouth daily., Disp: 90 tablet, Rfl: 3   omeprazole (PRILOSEC) 40 MG capsule, Take 40 mg by mouth daily., Disp: , Rfl:    pentoxifylline (TRENTAL) 400 MG CR tablet, Take 400 mg by mouth 3 (three) times daily with meals., Disp: , Rfl:    pregabalin (LYRICA) 75 MG capsule, Take 75 mg by mouth 3 (three) times daily., Disp: , Rfl:    rosuvastatin (CRESTOR) 5 MG tablet, Take 5 mg by mouth daily., Disp: , Rfl:    spironolactone (ALDACTONE) 25 MG tablet, Take 25 mg by mouth daily., Disp: , Rfl:    tamsulosin (FLOMAX) 0.4 MG CAPS capsule, Take 1 capsule (0.4 mg total) by mouth daily., Disp: 30 capsule, Rfl: 0   torsemide (DEMADEX) 20 MG tablet, Alternate 60 mg with 40 mg, Disp: 180 tablet, Rfl: 3   traZODone (DESYREL) 100 MG tablet, Take 50 mg by mouth at bedtime., Disp: , Rfl:  Allergies  Allergen Reactions   Ezetimibe Other (See Comments)    Myalgia   Gabapentin Itching and Other (See Comments)    Sore throat, breakouts   Statins Other (See Comments)    Myalgias (intolerance)     Social History   Socioeconomic History   Marital status: Married    Spouse name: Not on file   Number of children: Not on file   Years of education: Not on file   Highest education level: Not on file  Occupational History   Not on file  Tobacco Use   Smoking status: Every Day    Packs/day: 0.50    Years: 50.00    Total pack years: 25.00    Types: Cigarettes   Smokeless tobacco: Never  Vaping Use   Vaping Use: Never used  Substance and Sexual Activity   Alcohol use: Not Currently   Drug use: Never   Sexual activity: Not on file  Other Topics  Concern   Not on file  Social History Narrative   Not on file   Social Determinants of Health   Financial Resource Strain: Low Risk  (05/22/2022)   Overall Financial Resource Strain (CARDIA)    Difficulty of Paying Living Expenses: Not very hard  Food Insecurity: No Food Insecurity (05/22/2022)   Hunger Vital Sign    Worried About Running Out of Food in the Last Year: Never true    Ran Out of Food in the Last Year: Never true  Transportation Needs: No Transportation Needs (05/22/2022)   PRAPARE - Hydrologist (Medical): No    Lack of Transportation (Non-Medical): No  Physical Activity: Not on file  Stress: Not on file  Social Connections: Not on file  Intimate Partner Violence: Not on file    Physical Exam      Future  Appointments  Date Time Provider [place]  08/22/2022 10:00 AM Marzetta Board, DPM [facility]  09/23/2022  3:40 PM [facility] DEVICE REMOTES [facility]  10/10/2022 11:45 AM Hilty, Nadean Corwin, MD [facility] None  10/11/2022  9:45 AM [facility] None  10/14/2022  9:30 AM Marice Potter, MD [facility] None  12/23/2022  3:40 PM [facility] DEVICE REMOTES [facility]  03/24/2023  3:40 PM [facility] DEVICE REMOTES [facility]  06/23/2023  3:40 PM [facility] DEVICE REMOTES [facility]  09/22/2023  3:40 PM [facility] DEVICE REMOTES [facility]     ACTION: Home visit completed

## 2022-08-08 NOTE — Telephone Encounter (Signed)
Approved Effective from 08/06/2022 through 08/07/2023.

## 2022-08-14 ENCOUNTER — Other Ambulatory Visit (HOSPITAL_COMMUNITY): Payer: Self-pay

## 2022-08-14 NOTE — Progress Notes (Signed)
Paramedicine Encounter    Patient ID: Tommy Ward, male    DOB: 16-Mar-1941, 81 y.o.   MRN: [id number]   Arrived for home visit for [facility] where he was noted to be feeling good, alert and oriented with no obvious shortness of breath while walking from his garage to the kitchen area. He was 100% compliant with his meds over the last week. He reports feeling no dizziness, chest pain or shortness of breath. He had no lower leg swelling. He does endorse some lower leg and feet pain he reports this is from his neuropathy and arthritis. I reviewed meds and filled pill box for one week.   I obtained vitals: WT- 164lbs BP- 110/60 HR- 70 RR- 16 O2- 98% Lungs- clear Edema- none   We reviewed upcoming appointments and confirmed same.   I also reviewed fluid and sodium intake suggestions and reminding him to continue to do well taking his meds. He agreed with plan.   Home visit complete. I will see Kenley in one week.   [name], [place] 08/14/2022     Patient Care Team: Serita Grammes, MD as PCP - General (Family Medicine) Debara Pickett Nadean Corwin, MD as PCP - Cardiology (Cardiology) Vickie Epley, MD as PCP - Electrophysiology (Cardiology) Marice Potter, MD as Consulting Physician (Oncology) Azzie Glatter, DDS as Referring Physician (Oral Surgery) Joie Bimler, MD as Consulting Physician (Urology)  Patient Active Problem List   Diagnosis Date Noted   Anemia due to stage 4 chronic kidney disease ([place]) 03/20/2022   Hypercalcemia of malignancy 45/62/5638   Chronic systolic heart failure ([place])    Abnormal stress test 12/05/2020   Depressed left ventricular ejection fraction 12/05/2020   Nonrheumatic mitral valve regurgitation 12/05/2020   Pre-diabetes 12/05/2020   Pulmonary hypertension ([place]) 12/05/2020   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[place]) 12/05/2020   Tobacco use 12/05/2020   Dilated  cardiomyopathy ([place]) 12/05/2020   HFrEF (heart failure with reduced ejection fraction) ([place]) 11/23/2020   CAD (coronary artery disease) 11/23/2020   GERD (gastroesophageal reflux disease)    Hyperlipidemia    Hypertension    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[place]) 06/02/2020   Malignant neoplasm of prostate ([place]) 06/02/2020   AKI (acute kidney injury) ([place]) 11/25/2019   Hyperphosphatemia 11/25/2019   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[place]) 08/24/2019   Hyperkalemia 93/73/4287   Metabolic bone disease 68/06/5725   Vitamin D deficiency 08/24/2019   Benign hypertension with chronic kidney disease, stage III ([place]) 05/20/2019   Decreased cardiac ejection fraction 05/20/2019   Benign hypertension with CKD (chronic kidney disease) stage IV ([place]) 05/20/2019   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 02/24/2019   Olecranon bursitis of left elbow 06/25/2018   B12 deficiency 03/22/2018   Gastroesophageal reflux disease without esophagitis 03/22/2018   Chronic renal insufficiency, stage III (moderate) (HCC) 11/27/2015   Aortic valve disorder 07/08/2014   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   Cataract 11/2013   Essential hypertension 07/06/2012   Coronary arteriosclerosis 07/06/2012   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   Left bundle branch block 07/06/2012   Mixed hyperlipidemia 07/06/2012   Cancer ([place]) 08/2008   S/P radiation therapy > 12 wks ago 2010    Current Outpatient Medications:    aspirin EC 81 MG tablet, Take 1 tablet (81 mg total) by mouth daily., Disp: 90 tablet, Rfl: 3   Cetirizine HCl 10  MG CAPS, Take 1 capsule by mouth daily., Disp: , Rfl:    cholestyramine light (PREVALITE) 4 g packet, Take 4 g by mouth daily., Disp: , Rfl:    dapagliflozin propanediol (FARXIGA) 10 MG TABS tablet, Take 1 tablet (10 mg total) by mouth daily before breakfast. Pharmacy note: please split  bill Farxiga to [facility]: [id number] RX PCN: [id number] RX GROUP: [id number] RX ID: [id number], Disp: 90 tablet, Rfl: 3   digoxin (LANOXIN) 0.125 MG tablet, Take 1 tablet (0.125 mg total) by mouth every other day., Disp: 45 tablet, Rfl: 3   enzalutamide (XTANDI) 40 MG tablet, Take 160 mg by mouth daily., Disp: , Rfl:    Evolocumab (REPATHA SURECLICK) 694 MG/ML SOAJ, INJECT 1 DOSE UNDER THE SKIN EVERY 14 DAYS, Disp: 6 mL, Rfl: 3   metoprolol succinate (TOPROL XL) 25 MG 24 hr tablet, Take 1 tablet (25 mg total) by mouth daily., Disp: 90 tablet, Rfl: 3   omeprazole (PRILOSEC) 40 MG capsule, Take 40 mg by mouth daily., Disp: , Rfl:    pentoxifylline (TRENTAL) 400 MG CR tablet, Take 400 mg by mouth 3 (three) times daily with meals., Disp: , Rfl:    pregabalin (LYRICA) 75 MG capsule, Take 75 mg by mouth 3 (three) times daily., Disp: , Rfl:    rosuvastatin (CRESTOR) 5 MG tablet, Take 5 mg by mouth daily., Disp: , Rfl:    spironolactone (ALDACTONE) 25 MG tablet, Take 25 mg by mouth daily., Disp: , Rfl:    tamsulosin (FLOMAX) 0.4 MG CAPS capsule, Take 1 capsule (0.4 mg total) by mouth daily., Disp: 30 capsule, Rfl: 0   torsemide (DEMADEX) 20 MG tablet, Alternate 60 mg with 40 mg, Disp: 180 tablet, Rfl: 3   traZODone (DESYREL) 100 MG tablet, Take 50 mg by mouth at bedtime., Disp: , Rfl:  Allergies  Allergen Reactions   Ezetimibe Other (See Comments)    Myalgia   Gabapentin Itching and Other (See Comments)    Sore throat, breakouts   Statins Other (See Comments)    Myalgias (intolerance)     Social History   Socioeconomic History   Marital status: Married    Spouse name: Not on file   Number of children: Not on file   Years of education: Not on file   Highest education level: Not on file  Occupational History   Not on file  Tobacco Use   Smoking status: Every Day    Packs/day: 0.50    Years: 50.00    Total pack years: 25.00    Types: Cigarettes   Smokeless tobacco: Never  Vaping Use    Vaping Use: Never used  Substance and Sexual Activity   Alcohol use: Not Currently   Drug use: Never   Sexual activity: Not on file  Other Topics Concern   Not on file  Social History Narrative   Not on file   Social Determinants of Health   Financial Resource Strain: Low Risk  (05/22/2022)   Overall Financial Resource Strain (CARDIA)    Difficulty of Paying Living Expenses: Not very hard  Food Insecurity: No Food Insecurity (05/22/2022)   Hunger Vital Sign    Worried About Running Out of Food in the Last Year: Never true    Ran Out of Food in the Last Year: Never true  Transportation Needs: No Transportation Needs (05/22/2022)   PRAPARE - Hydrologist (Medical): No    Lack of Transportation (Non-Medical): No  Physical Activity:  Not on file  Stress: Not on file  Social Connections: Not on file  Intimate Partner Violence: Not on file    Physical Exam      Future Appointments  Date Time Provider [place]  08/22/2022 10:00 AM Marzetta Board, DPM [facility]  09/23/2022  3:40 PM [facility] DEVICE REMOTES [facility]  10/10/2022 11:45 AM Hilty, Nadean Corwin, MD [facility] None  10/11/2022  9:45 AM [facility] None  10/14/2022  9:30 AM Marice Potter, MD [facility] None  12/23/2022  3:40 PM [facility] DEVICE REMOTES [facility]  03/24/2023  3:40 PM [facility] DEVICE REMOTES [facility]  06/23/2023  3:40 PM [facility] DEVICE REMOTES [facility]  09/22/2023  3:40 PM [facility] DEVICE REMOTES [facility]     ACTION: Home visit completed

## 2022-08-21 ENCOUNTER — Other Ambulatory Visit (HOSPITAL_COMMUNITY): Payer: Self-pay

## 2022-08-21 NOTE — Progress Notes (Unsigned)
Paramedicine Encounter    Patient ID: Tommy Ward, male    DOB: [birth year], 82 y.o.   MRN: [id number]  Arrived for home visit for [facility] who reports to be feeling fine with some feet pain from his neuropathy. He denied shortness of breath, chest pain, dizziness, weight gain or swelling.   He was 100% compliant with meds over the last week. He denied any symptoms or side effects from medications.   I obtained vitals and they are as noted: WT- 164lbs BP- 124/58 HR- 70 RR- 16 O2- 96% Lungs- clear  Edema- none   I reviewed meds and confirmed same. I filled pill box for one week. Refills as noted and called in: -[facility] ([facility] by Friday) Gillermina Phy (pending pt assistance)     Patient Care Team: Serita Grammes, MD as PCP - General (Family Medicine) Debara Pickett, Nadean Corwin, MD as PCP - Cardiology (Cardiology) Vickie Epley, MD as PCP - Electrophysiology (Cardiology) Marice Potter, MD as Consulting Physician (Oncology) Azzie Glatter, DDS as Referring Physician (Oral Surgery) Joie Bimler, MD as Consulting Physician (Urology)  Patient Active Problem List   Diagnosis Date Noted   Anemia due to stage 4 chronic kidney disease ([place]) 03/20/2022   Hypercalcemia of malignancy 21/19/4174   Chronic systolic heart failure ([place])    Abnormal stress test 12/05/2020   Depressed left ventricular ejection fraction 12/05/2020   Nonrheumatic mitral valve regurgitation 12/05/2020   Pre-diabetes 12/05/2020   Pulmonary hypertension ([place]) 12/05/2020   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[place]) 12/05/2020   Tobacco use 12/05/2020   Dilated cardiomyopathy ([place]) 12/05/2020   HFrEF (heart failure with reduced ejection fraction) ([place]) 11/23/2020   CAD (coronary artery disease) 11/23/2020   GERD (gastroesophageal reflux disease)    Hyperlipidemia    Hypertension    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    Malignant neoplasm of prostate ([place]) 06/02/2020   AKI (acute kidney injury) ([place]) 11/25/2019   Hyperphosphatemia 11/25/2019   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[place]) 08/24/2019   Hyperkalemia 04/01/4817   Metabolic bone disease 56/31/4970   Vitamin D deficiency 08/24/2019   Benign hypertension with chronic kidney disease, stage III ([place]) 05/20/2019   Decreased cardiac ejection fraction 05/20/2019   Benign hypertension with CKD (chronic kidney disease) stage IV ([place]) 05/20/2019   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 02/24/2019   Olecranon bursitis of left elbow 06/25/2018   B12 deficiency 03/22/2018   Gastroesophageal reflux disease without esophagitis 03/22/2018   Chronic renal insufficiency, stage III (moderate) (HCC) 11/27/2015   Aortic valve disorder 07/08/2014   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   Cataract 11/2013   Essential hypertension 07/06/2012   Coronary arteriosclerosis 07/06/2012   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   Left bundle branch block 07/06/2012   Mixed hyperlipidemia 07/06/2012   Cancer ([place]) 08/2008   S/P radiation therapy > 12 wks ago 2010    Current Outpatient Medications:    aspirin EC 81 MG tablet, Take 1 tablet (81 mg total) by mouth daily., Disp: 90 tablet, Rfl: 3   Cetirizine HCl 10 MG CAPS, Take 1 capsule by mouth daily., Disp: , Rfl:    cholestyramine light (PREVALITE) 4 g packet, Take 4 g by mouth daily., Disp: , Rfl:    dapagliflozin propanediol (FARXIGA) 10 MG TABS tablet, Take 1 tablet (10 mg total) by mouth daily before breakfast. Pharmacy note: please split  bill Farxiga to [employer] [facility]: [id number] RX PCN: [id number] RX GROUP: [id number] RX ID: [id number], Disp: 90 tablet, Rfl: 3   digoxin (LANOXIN) 0.125 MG tablet, Take 1 tablet (0.125 mg total) by mouth every other day., Disp: 45 tablet, Rfl: 3   enzalutamide (XTANDI) 40 MG tablet, Take 160 mg by mouth  daily., Disp: , Rfl:    Evolocumab (REPATHA SURECLICK) 627 MG/ML SOAJ, INJECT 1 DOSE UNDER THE SKIN EVERY 14 DAYS, Disp: 6 mL, Rfl: 3   metoprolol succinate (TOPROL XL) 25 MG 24 hr tablet, Take 1 tablet (25 mg total) by mouth daily., Disp: 90 tablet, Rfl: 3   omeprazole (PRILOSEC) 40 MG capsule, Take 40 mg by mouth daily., Disp: , Rfl:    pentoxifylline (TRENTAL) 400 MG CR tablet, Take 400 mg by mouth 3 (three) times daily with meals., Disp: , Rfl:    pregabalin (LYRICA) 75 MG capsule, Take 75 mg by mouth 3 (three) times daily., Disp: , Rfl:    rosuvastatin (CRESTOR) 5 MG tablet, Take 5 mg by mouth daily., Disp: , Rfl:    spironolactone (ALDACTONE) 25 MG tablet, Take 25 mg by mouth daily., Disp: , Rfl:    tamsulosin (FLOMAX) 0.4 MG CAPS capsule, Take 1 capsule (0.4 mg total) by mouth daily., Disp: 30 capsule, Rfl: 0   torsemide (DEMADEX) 20 MG tablet, Alternate 60 mg with 40 mg, Disp: 180 tablet, Rfl: 3   traZODone (DESYREL) 100 MG tablet, Take 50 mg by mouth at bedtime., Disp: , Rfl:  Allergies  Allergen Reactions   Ezetimibe Other (See Comments)    Myalgia   Gabapentin Itching and Other (See Comments)    Sore throat, breakouts   Statins Other (See Comments)    Myalgias (intolerance)     Social History   Socioeconomic History   Marital status: Married    Spouse name: Not on file   Number of children: Not on file   Years of education: Not on file   Highest education level: Not on file  Occupational History   Not on file  Tobacco Use   Smoking status: Every Day    Packs/day: 0.50    Years: 50.00    Total pack years: 25.00    Types: Cigarettes   Smokeless tobacco: Never  Vaping Use   Vaping Use: Never used  Substance and Sexual Activity   Alcohol use: Not Currently   Drug use: Never   Sexual activity: Not on file  Other Topics Concern   Not on file  Social History Narrative   Not on file   Social Determinants of Health   Financial Resource Strain: Low Risk   (05/22/2022)   Overall Financial Resource Strain (CARDIA)    Difficulty of Paying Living Expenses: Not very hard  Food Insecurity: No Food Insecurity (05/22/2022)   Hunger Vital Sign    Worried About Running Out of Food in the Last Year: Never true    Ran Out of Food in the Last Year: Never true  Transportation Needs: No Transportation Needs (05/22/2022)   PRAPARE - Hydrologist (Medical): No    Lack of Transportation (Non-Medical): No  Physical Activity: Not on file  Stress: Not on file  Social Connections: Not on file  Intimate Partner Violence: Not on file    Physical Exam      Future Appointments  Date Time Provider [place]  08/22/2022 10:00 AM Marzetta Board, DPM [facility]  09/23/2022  3:40 PM  [facility] DEVICE REMOTES [facility]  10/10/2022 11:45 AM Hilty, Nadean Corwin, MD [facility] None  10/11/2022  9:45 AM [facility] None  10/14/2022  9:30 AM Marice Potter, MD [facility] None  12/23/2022  3:40 PM [facility] DEVICE REMOTES [facility]  03/24/2023  3:40 PM [facility] DEVICE REMOTES [facility]  06/23/2023  3:40 PM [facility] DEVICE REMOTES [facility]  09/22/2023  3:40 PM [facility] DEVICE REMOTES [facility]     ACTION: Home visit completed

## 2022-08-22 ENCOUNTER — Ambulatory Visit (INDEPENDENT_AMBULATORY_CARE_PROVIDER_SITE_OTHER): Payer: Medicare Other | Admitting: Podiatry

## 2022-08-22 ENCOUNTER — Other Ambulatory Visit (HOSPITAL_COMMUNITY): Payer: Self-pay

## 2022-08-22 ENCOUNTER — Telehealth (HOSPITAL_COMMUNITY): Payer: Self-pay

## 2022-08-22 ENCOUNTER — Encounter: Payer: Self-pay | Admitting: Podiatry

## 2022-08-22 VITALS — BP 128/76

## 2022-08-22 DIAGNOSIS — G629 Polyneuropathy, unspecified: Secondary | ICD-10-CM | POA: Diagnosis not present

## 2022-08-22 DIAGNOSIS — B351 Tinea unguium: Secondary | ICD-10-CM | POA: Diagnosis not present

## 2022-08-22 DIAGNOSIS — M79675 Pain in left toe(s): Secondary | ICD-10-CM | POA: Diagnosis not present

## 2022-08-22 DIAGNOSIS — M79674 Pain in right toe(s): Secondary | ICD-10-CM

## 2022-08-22 MED ORDER — SPIRONOLACTONE 25 MG PO TABS: 25.0000 mg | ORAL_TABLET | Freq: Every day | ORAL | 1 refills | Status: DC

## 2022-08-22 NOTE — Progress Notes (Signed)
  Subjective:  Patient ID: Tommy Ward, male    DOB: 04/14/1941,  MRN: [id number]  Tommy Ward presents to clinic today for painful elongated mycotic toenails 1-5 bilaterally which are tender when wearing enclosed shoe gear. Pain is relieved with periodic professional debridement.  Chief Complaint  Patient presents with   Nail Problem    Nail Trim Not Diabetic PCP - Dr Jeryl Columbia. Last OV 07/2022   New problem(s): None.   PCP is Serita Grammes, MD.  Allergies  Allergen Reactions   Ezetimibe Other (See Comments)    Myalgia   Gabapentin Itching and Other (See Comments)    Sore throat, breakouts   Statins Other (See Comments)    Myalgias (intolerance)    Review of Systems: Negative except as noted in the HPI.  Objective: No changes noted in today's physical examination. Vitals:   08/22/22 1029  BP: 128/76   Tommy Ward is a pleasant 82 y.o. male WD, WN in NAD. AAO x 3.  Vascular Examination: CFT <3 seconds b/l. DP/PT pulses faintly palpable b/l. Skin temperature gradient warm to warm b/l. No pain with calf compression. No ischemia or gangrene. No cyanosis or clubbing noted b/l. Pedal hair sparse. Varicosities present b/l.   Neurological Examination: Sensation grossly intact b/l with 10 gram monofilament. Vibratory sensation intact b/l. Pt has subjective symptoms of neuropathy.  Dermatological Examination: Pedal skin warm and supple b/l. Toenails 1-5 b/l thick, discolored, elongated with subungual debris and pain on dorsal palpation.  Evidence of subacute paronychia right great toe lateral border with onycholysis and erythema. No purulence, no nail border hypertrophy.  Musculoskeletal Examination: Muscle strength 5/5 to b/l LE. Limited joint ROM to the 1st MPJ b/l right >left.  Radiographs: None  Assessment/Plan: 1. Pain due to onychomycosis of toenails of both feet   2. Neuropathy     No orders of the defined types were placed in this  encounter.  -Consent given for treatment as described below: -Examined patient. -Patient to continue soft, supportive shoe gear daily. -Toenails 1-5 b/l were debrided in length and girth with sterile nail nippers and dremel without iatrogenic bleeding.  -Patient/POA to call should there be question/concern in the interim.   Return in about 3 months (around 11/21/2022).  Marzetta Board, DPM

## 2022-08-22 NOTE — Telephone Encounter (Signed)
Jorge Ny advised via staff message [employer] is approved for Xtandi through 12/24.   I called and set up delivery for same and it will be delivered on 08/26/22.    I texted [name] Pulse( colins wife) to update them on same.   [name], [place] 08/22/2022

## 2022-08-28 ENCOUNTER — Other Ambulatory Visit (HOSPITAL_COMMUNITY): Payer: Self-pay

## 2022-08-28 NOTE — Progress Notes (Signed)
Paramedicine Encounter    Patient ID: Tommy Ward, male    DOB: 1940-10-23, 81 y.o.   MRN: [id number]   Complaints-mild cough with sinus congestion  Assessment- A&Ox4, warm and dry, no lower leg swelling, no abdominal swelling, no JVD, lungs clear.   Compliance with meds- no missed doses over last week.   Pill box filled for one week.   Refills needed- NONE   Meds changes since last visit-NONE     Social changes- NONE    BP 102/60   Pulse 70   Resp 16   Wt 163 lb 9.6 oz (74.2 kg)   SpO2 97%   BMI 24.16 kg/m  Weight yesterday-didn't weigh Last visit weight-164lbs  Arrived for home visit for [place] Ward who is A&Ox4 with no complaints other than a mild cough and some sinus congestion. He states he has been feeling okay otherwise. He denied chest pain, dizziness, shortness of breath, swelling or weight gain. He was 100% compliant with his meds over the last week. Medications reviewed and pill box filled for one week. No refills needed at present. Gillermina Phy was delivered successfully. No other needs at this time. I will see [name] in one week. He agreed with plan.    [name], [place]  ACTION: Home visit completed    Patient Care Team: Serita Grammes, MD as PCP - General (Family Medicine) Debara Pickett Nadean Corwin, MD as PCP - Cardiology (Cardiology) Vickie Epley, MD as PCP - Electrophysiology (Cardiology) Marice Potter, MD as Consulting Physician (Oncology) Azzie Glatter, DDS as Referring Physician (Oral Surgery) Joie Bimler, MD as Consulting Physician (Urology)  Patient Active Problem List   Diagnosis Date Noted   Anemia due to stage 4 chronic kidney disease ([place]) 03/20/2022   Hypercalcemia of malignancy 82/70/7867   Chronic systolic heart failure ([place])    Abnormal stress test 12/05/2020   Depressed left ventricular ejection fraction 12/05/2020   Nonrheumatic mitral valve regurgitation 12/05/2020   Pre-diabetes 12/05/2020    Pulmonary hypertension ([place]) 12/05/2020   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[place]) 12/05/2020   Tobacco use 12/05/2020   Dilated cardiomyopathy ([place]) 12/05/2020   HFrEF (heart failure with reduced ejection fraction) ([place]) 11/23/2020   CAD (coronary artery disease) 11/23/2020   GERD (gastroesophageal reflux disease)    Hyperlipidemia    Hypertension    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   Malignant neoplasm of prostate ([place]) 06/02/2020   AKI (acute kidney injury) ([place]) 11/25/2019   Hyperphosphatemia 11/25/2019   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[place]) 08/24/2019   Hyperkalemia 54/49/2010   Metabolic bone disease 02/27/1974   Vitamin D deficiency 08/24/2019   Benign hypertension with chronic kidney disease, stage III ([place]) 05/20/2019   Decreased cardiac ejection fraction 05/20/2019   Benign hypertension with CKD (chronic kidney disease) stage IV ([place]) 05/20/2019   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 02/24/2019   Olecranon bursitis of left elbow 06/25/2018   B12 deficiency 03/22/2018   Gastroesophageal reflux disease without esophagitis 03/22/2018   Chronic renal insufficiency, stage III (moderate) (HCC) 11/27/2015   Aortic valve disorder 07/08/2014   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   Cataract 11/2013   Essential hypertension 07/06/2012   Coronary arteriosclerosis 07/06/2012   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   Left bundle branch block 07/06/2012   Mixed hyperlipidemia 07/06/2012   Cancer ([place]) 08/2008   S/P radiation therapy >  12 wks ago 2010    Current Outpatient Medications:    aspirin EC 81 MG tablet, Take 1 tablet (81 mg total) by mouth daily., Disp: 90 tablet, Rfl: 3   Cetirizine HCl 10 MG CAPS, Take 1 capsule by mouth daily., Disp: , Rfl:    cholestyramine light (PREVALITE) 4 g packet, Take  4 g by mouth daily., Disp: , Rfl:    dapagliflozin propanediol (FARXIGA) 10 MG TABS tablet, Take 1 tablet (10 mg total) by mouth daily before breakfast. Pharmacy note: please split bill Farxiga to [place]: [id number] RX PCN: [id number] RX GROUP: [id number] RX ID: [id number], Disp: 90 tablet, Rfl: 3   digoxin (LANOXIN) 0.125 MG tablet, Take 1 tablet (0.125 mg total) by mouth every other day., Disp: 45 tablet, Rfl: 3   enzalutamide (XTANDI) 40 MG tablet, Take 160 mg by mouth daily., Disp: , Rfl:    Evolocumab (REPATHA SURECLICK) 269 MG/ML SOAJ, INJECT 1 DOSE UNDER THE SKIN EVERY 14 DAYS, Disp: 6 mL, Rfl: 3   metoprolol succinate (TOPROL XL) 25 MG 24 hr tablet, Take 1 tablet (25 mg total) by mouth daily., Disp: 90 tablet, Rfl: 3   omeprazole (PRILOSEC) 40 MG capsule, Take 40 mg by mouth daily., Disp: , Rfl:    pentoxifylline (TRENTAL) 400 MG CR tablet, Take 400 mg by mouth 3 (three) times daily with meals., Disp: , Rfl:    pregabalin (LYRICA) 75 MG capsule, Take 75 mg by mouth 3 (three) times daily., Disp: , Rfl:    rosuvastatin (CRESTOR) 5 MG tablet, Take 5 mg by mouth daily., Disp: , Rfl:    spironolactone (ALDACTONE) 25 MG tablet, Take 1 tablet (25 mg total) by mouth daily., Disp: 90 tablet, Rfl: 1   tamsulosin (FLOMAX) 0.4 MG CAPS capsule, Take 1 capsule (0.4 mg total) by mouth daily., Disp: 30 capsule, Rfl: 0   torsemide (DEMADEX) 20 MG tablet, Alternate 60 mg with 40 mg, Disp: 180 tablet, Rfl: 3   traZODone (DESYREL) 100 MG tablet, Take 50 mg by mouth at bedtime., Disp: , Rfl:  Allergies  Allergen Reactions   Ezetimibe Other (See Comments)    Myalgia   Gabapentin Itching and Other (See Comments)    Sore throat, breakouts   Statins Other (See Comments)    Myalgias (intolerance)     Social History   Socioeconomic History   Marital status: Married    Spouse name: Not on file   Number of children: Not on file   Years of education: Not on file   Highest education level: Not on file   Occupational History   Not on file  Tobacco Use   Smoking status: Every Day    Packs/day: 0.50    Years: 50.00    Total pack years: 25.00    Types: Cigarettes   Smokeless tobacco: Never  Vaping Use   Vaping Use: Never used  Substance and Sexual Activity   Alcohol use: Not Currently   Drug use: Never   Sexual activity: Not on file  Other Topics Concern   Not on file  Social History Narrative   Not on file   Social Determinants of Health   Financial Resource Strain: Low Risk  (05/22/2022)   Overall Financial Resource Strain (CARDIA)    Difficulty of Paying Living Expenses: Not very hard  Food Insecurity: No Food Insecurity (05/22/2022)   Hunger Vital Sign    Worried About Running Out of Food in the Last Year: Never true  Ran Out of Food in the Last Year: Never true  Transportation Needs: No Transportation Needs (05/22/2022)   PRAPARE - Hydrologist (Medical): No    Lack of Transportation (Non-Medical): No  Physical Activity: Not on file  Stress: Not on file  Social Connections: Not on file  Intimate Partner Violence: Not on file    Physical Exam      Future Appointments  Date Time Provider [place]  09/23/2022  3:40 PM [facility] DEVICE REMOTES [facility]  10/10/2022 11:45 AM Hilty, Nadean Corwin, MD [facility] None  10/11/2022  9:45 AM [facility] None  10/14/2022  9:30 AM Marice Potter, MD [facility] None  11/21/2022 10:00 AM Marzetta Board, DPM [facility]  12/23/2022  3:40 PM [facility] DEVICE REMOTES [facility]  03/24/2023  3:40 PM [facility] DEVICE REMOTES [facility]  06/23/2023  3:40 PM [facility] DEVICE REMOTES [facility]  09/22/2023  3:40 PM [facility] DEVICE REMOTES [facility]

## 2022-09-04 ENCOUNTER — Other Ambulatory Visit (HOSPITAL_COMMUNITY): Payer: Self-pay

## 2022-09-04 NOTE — Progress Notes (Signed)
Paramedicine Encounter    Patient ID: Tommy Ward, male    DOB: 05/11/1941, 82 y.o.   MRN: [id number]   Complaints-Neuropathy Pain  Assessment- A&Ox4, warm and dry, no leg swelling, no weight gain, no shortness of breath, no dizziness, no chest pain.   Compliance with meds- 100% over the last week.   Pill box filled for one week.   Refills needed- crestor called into [name].   Meds changes since last visit- NONE     Social changes- NONE    BP 112/62   Pulse 70   Resp 16   Wt 164 lb 9.6 oz (74.7 kg)   SpO2 97%   BMI 24.31 kg/m  Weight yesterday-did not weigh  Last visit weight-163lbs Visit today- 164lbs   Arrived for home visit for [name] where he was seated at the kitchen table alert and oriented, no complaints other than neuropathy in his feet. He denied shortness of breath, chest pain, dizziness or swelling or weight gain.   Vitals obtained as noted.  Meds reviewed- pill box filled for one week.  Refills as noted.  Appointments reviewed, I will see him in one week. Home visit complete.   [name], [name]  ACTION: Home visit completed   Patient Care Team: Serita Grammes, MD as PCP - General (Family Medicine) Debara Pickett Nadean Corwin, MD as PCP - Cardiology (Cardiology) Vickie Epley, MD as PCP - Electrophysiology (Cardiology) Marice Potter, MD as Consulting Physician (Oncology) Azzie Glatter, DDS as Referring Physician (Oral Surgery) Joie Bimler, MD as Consulting Physician (Urology)  Patient Active Problem List   Diagnosis Date Noted   Anemia due to stage 4 chronic kidney disease ([place]) 03/20/2022   Hypercalcemia of malignancy 36/14/4315   Chronic systolic heart failure ([place])    Abnormal stress test 12/05/2020   Depressed left ventricular ejection fraction 12/05/2020   Nonrheumatic mitral valve regurgitation 12/05/2020   Pre-diabetes 12/05/2020   Pulmonary hypertension ([place]) 12/05/2020   Nonrheumatic  tricuspid valve regurgitation 12/05/2020   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[place]) 12/05/2020   Tobacco use 12/05/2020   Dilated cardiomyopathy ([place]) 12/05/2020   HFrEF (heart failure with reduced ejection fraction) ([place]) 11/23/2020   CAD (coronary artery disease) 11/23/2020   GERD (gastroesophageal reflux disease)    Hyperlipidemia    Hypertension    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/02/2020   Malignant neoplasm of prostate ([place]) 06/02/2020   AKI (acute kidney injury) ([place]) 11/25/2019   Hyperphosphatemia 11/25/2019   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[place]) 08/24/2019   Hyperkalemia 40/03/6760   Metabolic bone disease 95/04/3266   Vitamin D deficiency 08/24/2019   Benign hypertension with chronic kidney disease, stage III ([place]) 05/20/2019   Decreased cardiac ejection fraction 05/20/2019   Benign hypertension with CKD (chronic kidney disease) stage IV ([place]) 05/20/2019   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 02/24/2019   Olecranon bursitis of left elbow 06/25/2018   B12 deficiency 03/22/2018   Gastroesophageal reflux disease without esophagitis 03/22/2018   Chronic renal insufficiency, stage III (moderate) (HCC) 11/27/2015   Aortic valve disorder 07/08/2014   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   Cataract 11/2013   Essential hypertension 07/06/2012   Coronary arteriosclerosis 07/06/2012   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   Left bundle branch block 07/06/2012   Mixed hyperlipidemia 07/06/2012   Cancer ([place]) 08/2008   S/P radiation therapy > 12 wks ago 2010    Current  Outpatient Medications:    aspirin EC 81 MG tablet, Take 1 tablet (81 mg total) by mouth daily., Disp: 90 tablet, Rfl: 3   Cetirizine HCl 10 MG CAPS, Take 1 capsule by mouth daily., Disp: , Rfl:    cholestyramine light (PREVALITE) 4 g packet, Take 4 g by mouth daily., Disp: , Rfl:    dapagliflozin  propanediol (FARXIGA) 10 MG TABS tablet, Take 1 tablet (10 mg total) by mouth daily before breakfast. Pharmacy note: please split bill Farxiga to [place]: [id number] RX PCN: [id number] RX GROUP: [id number] RX ID: [id number], Disp: 90 tablet, Rfl: 3   digoxin (LANOXIN) 0.125 MG tablet, Take 1 tablet (0.125 mg total) by mouth every other day., Disp: 45 tablet, Rfl: 3   enzalutamide (XTANDI) 40 MG tablet, Take 160 mg by mouth daily., Disp: , Rfl:    Evolocumab (REPATHA SURECLICK) 196 MG/ML SOAJ, INJECT 1 DOSE UNDER THE SKIN EVERY 14 DAYS, Disp: 6 mL, Rfl: 3   metoprolol succinate (TOPROL XL) 25 MG 24 hr tablet, Take 1 tablet (25 mg total) by mouth daily., Disp: 90 tablet, Rfl: 3   omeprazole (PRILOSEC) 40 MG capsule, Take 40 mg by mouth daily., Disp: , Rfl:    pentoxifylline (TRENTAL) 400 MG CR tablet, Take 400 mg by mouth 3 (three) times daily with meals., Disp: , Rfl:    pregabalin (LYRICA) 75 MG capsule, Take 75 mg by mouth 3 (three) times daily., Disp: , Rfl:    rosuvastatin (CRESTOR) 5 MG tablet, Take 5 mg by mouth daily., Disp: , Rfl:    spironolactone (ALDACTONE) 25 MG tablet, Take 1 tablet (25 mg total) by mouth daily., Disp: 90 tablet, Rfl: 1   tamsulosin (FLOMAX) 0.4 MG CAPS capsule, Take 1 capsule (0.4 mg total) by mouth daily., Disp: 30 capsule, Rfl: 0   torsemide (DEMADEX) 20 MG tablet, Alternate 60 mg with 40 mg, Disp: 180 tablet, Rfl: 3   traZODone (DESYREL) 100 MG tablet, Take 50 mg by mouth at bedtime., Disp: , Rfl:  Allergies  Allergen Reactions   Ezetimibe Other (See Comments)    Myalgia   Gabapentin Itching and Other (See Comments)    Sore throat, breakouts   Statins Other (See Comments)    Myalgias (intolerance)     Social History   Socioeconomic History   Marital status: Married    Spouse name: Not on file   Number of children: Not on file   Years of education: Not on file   Highest education level: Not on file  Occupational History   Not on file  Tobacco Use    Smoking status: Every Day    Packs/day: 0.50    Years: 50.00    Total pack years: 25.00    Types: Cigarettes   Smokeless tobacco: Never  Vaping Use   Vaping Use: Never used  Substance and Sexual Activity   Alcohol use: Not Currently   Drug use: Never   Sexual activity: Not on file  Other Topics Concern   Not on file  Social History Narrative   Not on file   Social Determinants of Health   Financial Resource Strain: Low Risk  (05/22/2022)   Overall Financial Resource Strain (CARDIA)    Difficulty of Paying Living Expenses: Not very hard  Food Insecurity: No Food Insecurity (05/22/2022)   Hunger Vital Sign    Worried About Running Out of Food in the Last Year: Never true    [place] in the Last Year:  Never true  Transportation Needs: No Transportation Needs (05/22/2022)   PRAPARE - Hydrologist (Medical): No    Lack of Transportation (Non-Medical): No  Physical Activity: Not on file  Stress: Not on file  Social Connections: Not on file  Intimate Partner Violence: Not on file    Physical Exam      Future Appointments  Date Time Provider [place]  09/23/2022  3:40 PM [facility] DEVICE REMOTES [facility]  10/10/2022 11:45 AM Hilty, Nadean Corwin, MD [facility] None  10/11/2022  9:45 AM [facility] None  10/14/2022  9:30 AM Marice Potter, MD [facility] None  11/21/2022 10:00 AM Marzetta Board, DPM [facility]  12/23/2022  3:40 PM [facility] DEVICE REMOTES [facility]  03/24/2023  3:40 PM [facility] DEVICE REMOTES [facility]  06/23/2023  3:40 PM [facility] DEVICE REMOTES [facility]  09/22/2023  3:40 PM [facility] DEVICE REMOTES [facility]

## 2022-09-11 ENCOUNTER — Other Ambulatory Visit (HOSPITAL_COMMUNITY): Payer: Self-pay

## 2022-09-11 NOTE — Progress Notes (Signed)
Paramedicine Encounter    Patient ID: Tommy Ward, male    DOB: 07/14/1941, 82 y.o.   MRN: [id number]   Complaints- neuropathy in feet  Assessment- CAOX4, warm and dry, ambulatory, no lower leg swelling, no weight gain, lungs clear.   Compliance with meds- one missed noon dose of pregabalin   Pill box filled- for one week   Refills needed- crestor, trazadone   Meds changes since last visit- NONE     Social changes- NONE    BP (!) 98/52   Pulse 70   Resp 16   Wt 163 lb (73.9 kg)   SpO2 97%   BMI 24.07 kg/m  Weight yesterday-didn't weigh Last visit weight-164lbs  Arrived for home visit for [facility] who reports to be feeling well over the last week other than ongoing neuropathy and arthritis pain. He did admit he had some dizziness one day he didn't hydrate very well. He was able to pick up on this quickly and hydrated and the dizziness subsided the next day. He has been taking his medications regularly. He missed one dose of noon pregabalin over the last week.   Vitas were obtained as noted. Lungs clear, no lower leg swelling, no cough, he reports urinating frequently. No GI symptoms. No chest pain, shortness of breath.   I reviewed meds and filled pill box for one week. Refills as noted.  Appointments reviewed and confirmed. He will see PCP this Friday at 1330.   We reviewed heart healthy diet and fluid suggestions. He agreed with plan. Home visit complete.    [name], [place]  ACTION: Home visit completed    Patient Care Team: Serita Grammes, MD as PCP - General (Family Medicine) Debara Pickett Nadean Corwin, MD as PCP - Cardiology (Cardiology) Vickie Epley, MD as PCP - Electrophysiology (Cardiology) Marice Potter, MD as Consulting Physician (Oncology) Azzie Glatter, DDS as Referring Physician (Oral Surgery) Joie Bimler, MD as Consulting Physician (Urology)  Patient Active Problem List   Diagnosis Date Noted   Anemia due to  stage 4 chronic kidney disease ([place]) 03/20/2022   Hypercalcemia of malignancy 26/83/4196   Chronic systolic heart failure ([place])    Abnormal stress test 12/05/2020   Depressed left ventricular ejection fraction 12/05/2020   Nonrheumatic mitral valve regurgitation 12/05/2020   Pre-diabetes 12/05/2020   Pulmonary hypertension ([place]) 12/05/2020   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[place]) 12/05/2020   Tobacco use 12/05/2020   Dilated cardiomyopathy ([place]) 12/05/2020   HFrEF (heart failure with reduced ejection fraction) ([place]) 11/23/2020   CAD (coronary artery disease) 11/23/2020   GERD (gastroesophageal reflux disease)    Hyperlipidemia    Hypertension    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/02/2020   Malignant neoplasm of prostate ([place]) 06/02/2020   AKI (acute kidney injury) ([place]) 11/25/2019   Hyperphosphatemia 11/25/2019   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[place]) 08/24/2019   Hyperkalemia 22/29/7989   Metabolic bone disease 21/19/4174   Vitamin D deficiency 08/24/2019   Benign hypertension with chronic kidney disease, stage III ([place]) 05/20/2019   Decreased cardiac ejection fraction 05/20/2019   Benign hypertension with CKD (chronic kidney disease) stage IV ([place]) 05/20/2019   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 02/24/2019   Olecranon bursitis of left elbow 06/25/2018   B12 deficiency 03/22/2018   Gastroesophageal reflux disease without esophagitis 03/22/2018   Chronic renal insufficiency, stage III (moderate) ([place]) 11/27/2015  Aortic valve disorder 07/08/2014   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   Cataract 11/2013   Essential hypertension 07/06/2012   Coronary arteriosclerosis 07/06/2012   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   Left bundle branch block 07/06/2012   Mixed hyperlipidemia 07/06/2012   Cancer ([place])  08/2008   S/P radiation therapy > 12 wks ago 2010    Current Outpatient Medications:    aspirin EC 81 MG tablet, Take 1 tablet (81 mg total) by mouth daily., Disp: 90 tablet, Rfl: 3   Cetirizine HCl 10 MG CAPS, Take 1 capsule by mouth daily., Disp: , Rfl:    cholestyramine light (PREVALITE) 4 g packet, Take 4 g by mouth daily., Disp: , Rfl:    dapagliflozin propanediol (FARXIGA) 10 MG TABS tablet, Take 1 tablet (10 mg total) by mouth daily before breakfast. Pharmacy note: please split bill Farxiga to [place] [facility]: [id number] RX PCN: [id number] RX GROUP: [id number] RX ID: [id number], Disp: 90 tablet, Rfl: 3   digoxin (LANOXIN) 0.125 MG tablet, Take 1 tablet (0.125 mg total) by mouth every other day., Disp: 45 tablet, Rfl: 3   enzalutamide (XTANDI) 40 MG tablet, Take 160 mg by mouth daily., Disp: , Rfl:    Evolocumab (REPATHA SURECLICK) 989 MG/ML SOAJ, INJECT 1 DOSE UNDER THE SKIN EVERY 14 DAYS, Disp: 6 mL, Rfl: 3   metoprolol succinate (TOPROL XL) 25 MG 24 hr tablet, Take 1 tablet (25 mg total) by mouth daily., Disp: 90 tablet, Rfl: 3   Multiple Vitamins-Minerals (PRESERVISION/LUTEIN) CAPS, Take 1 capsule by mouth at bedtime., Disp: , Rfl:    omeprazole (PRILOSEC) 40 MG capsule, Take 40 mg by mouth daily., Disp: , Rfl:    pentoxifylline (TRENTAL) 400 MG CR tablet, Take 400 mg by mouth 3 (three) times daily with meals., Disp: , Rfl:    pregabalin (LYRICA) 75 MG capsule, Take 75 mg by mouth 3 (three) times daily., Disp: , Rfl:    rosuvastatin (CRESTOR) 5 MG tablet, Take 5 mg by mouth daily., Disp: , Rfl:    spironolactone (ALDACTONE) 25 MG tablet, Take 1 tablet (25 mg total) by mouth daily., Disp: 90 tablet, Rfl: 1   tamsulosin (FLOMAX) 0.4 MG CAPS capsule, Take 1 capsule (0.4 mg total) by mouth daily., Disp: 30 capsule, Rfl: 0   torsemide (DEMADEX) 20 MG tablet, Alternate 60 mg with 40 mg, Disp: 180 tablet, Rfl: 3   traZODone (DESYREL) 100 MG tablet, Take 50 mg by mouth at bedtime., Disp: , Rfl:   Allergies  Allergen Reactions   Ezetimibe Other (See Comments)    Myalgia   Gabapentin Itching and Other (See Comments)    Sore throat, breakouts   Statins Other (See Comments)    Myalgias (intolerance)     Social History   Socioeconomic History   Marital status: Married    Spouse name: Not on file   Number of children: Not on file   Years of education: Not on file   Highest education level: Not on file  Occupational History   Not on file  Tobacco Use   Smoking status: Every Day    Packs/day: 0.50    Years: 50.00    Total pack years: 25.00    Types: Cigarettes   Smokeless tobacco: Never  Vaping Use   Vaping Use: Never used  Substance and Sexual Activity   Alcohol use: Not Currently   Drug use: Never   Sexual activity: Not on file  Other Topics Concern   Not  on file  Social History Narrative   Not on file   Social Determinants of Health   Financial Resource Strain: Low Risk  (05/22/2022)   Overall Financial Resource Strain (CARDIA)    Difficulty of Paying Living Expenses: Not very hard  Food Insecurity: No Food Insecurity (05/22/2022)   Hunger Vital Sign    Worried About Running Out of Food in the Last Year: Never true    Ran Out of Food in the Last Year: Never true  Transportation Needs: No Transportation Needs (05/22/2022)   PRAPARE - Hydrologist (Medical): No    Lack of Transportation (Non-Medical): No  Physical Activity: Not on file  Stress: Not on file  Social Connections: Not on file  Intimate Partner Violence: Not on file    Physical Exam      Future Appointments  Date Time Provider [place]  09/23/2022  3:40 PM [facility] DEVICE REMOTES [facility]  10/10/2022 11:45 AM Hilty, Nadean Corwin, MD [facility] None  10/11/2022  9:45 AM [facility] None  10/14/2022  9:30 AM Marice Potter, MD [facility] None  11/21/2022 10:00 AM Marzetta Board, DPM [facility]  12/23/2022  3:40 PM  [facility] DEVICE REMOTES [facility]  03/24/2023  3:40 PM [facility] DEVICE REMOTES [facility]  06/23/2023  3:40 PM [facility] DEVICE REMOTES [facility]  09/22/2023  3:40 PM [facility] DEVICE REMOTES [facility]

## 2022-09-16 ENCOUNTER — Encounter: Payer: Self-pay | Admitting: Oncology

## 2022-09-18 ENCOUNTER — Telehealth (HOSPITAL_COMMUNITY): Payer: Self-pay

## 2022-09-18 ENCOUNTER — Other Ambulatory Visit (HOSPITAL_COMMUNITY): Payer: Self-pay

## 2022-09-18 MED ORDER — METOPROLOL SUCCINATE ER 25 MG PO TB24: 12.5000 mg | ORAL_TABLET | Freq: Every day | ORAL | 3 refills | Status: DC

## 2022-09-18 NOTE — Telephone Encounter (Signed)
[place] called to report that patient complains of feeling lightheaded. Patients blood pressure sitting is 122/80 standing it is 90/50. His heart rate also dropped sitting it was 70 standing it was 48.   Per Janett Billow [place], np decrease patients Metoprolol to 12.'5mg'$  qhs and hold Blood pressure medications if his Systolic blood pressure is less than 100. Heather advised and verbalized understanding. Med list updated to reflect changes.   Meds ordered this encounter  Medications   metoprolol succinate (TOPROL XL) 25 MG 24 hr tablet    Sig: Take 0.5 tablets (12.5 mg total) by mouth at bedtime.    Dispense:  45 tablet    Refill:  3    Please cancel all previous orders for current medication. Change in dosage or pill size.

## 2022-09-18 NOTE — Progress Notes (Signed)
Paramedicine Encounter    Patient ID: Tommy Ward, male    DOB: 26-May-1941, 82 y.o.   MRN: [id number]   Complaints- dizzy upon standing   Assessment- alert, warm and dry, ambulatory but dizzy when standing, no weight gain, no swelling.   Compliance with meds- no missed doses   Pill box filled for one week.   Refills needed- Xtandi   Meds changes since last visit- TODAY: Decrease Metoprolol to 12.'5mg'$  at bedtime. Per Allena Katz NP.     Social changes- none    BP 122/80   Pulse 70   Resp 16   Wt 162 lb (73.5 kg)   SpO2 97%   BMI 23.92 kg/m  Weight yesterday-didn't weigh Last visit weight-163lbs Weight today- 162lbs   Arrived for home visit for [facility] who reports to be feeling okay but says that he has had several dizzy episodes over the last few days especially upon standing. He denied any falls or syncopal episodes but says it's very noticeable.   I obtained ortho vitals as noted: Sitting-  BP: 122/80 HR: 70  Standing: BP: 90/50 HR: 50   I contacted HF clinic and Allena Katz NP ordered to decrease metoprolol to 12.'5mg'$  at bedtime. She also states if SBP is <100 to hold metoprolol. I reviewed same with Tommy Ward and his wife Tommy Ward Pulse they understood. I confirmed meds and filled pill box appropriately.   Education provided. I advised him to check his blood pressure twice daily before taking medications. He agreed with plan and has a home BP cuff. Appointments reviewed.   Home visit complete.     [name], [place]  ACTION: Home visit completed    Patient Care Team: Serita Grammes, MD as PCP - General (Family Medicine) Debara Pickett Nadean Corwin, MD as PCP - Cardiology (Cardiology) Vickie Epley, MD as PCP - Electrophysiology (Cardiology) Marice Potter, MD as Consulting Physician (Oncology) Azzie Glatter, DDS as Referring Physician (Oral Surgery) Joie Bimler, MD as Consulting Physician (Urology)  Patient Active Problem  List   Diagnosis Date Noted   Anemia due to stage 4 chronic kidney disease ([place]) 03/20/2022   Hypercalcemia of malignancy 53/61/4431   Chronic systolic heart failure ([place])    Abnormal stress test 12/05/2020   Depressed left ventricular ejection fraction 12/05/2020   Nonrheumatic mitral valve regurgitation 12/05/2020   Pre-diabetes 12/05/2020   Pulmonary hypertension ([place]) 12/05/2020   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[place]) 12/05/2020   Tobacco use 12/05/2020   Dilated cardiomyopathy ([place]) 12/05/2020   HFrEF (heart failure with reduced ejection fraction) ([place]) 11/23/2020   CAD (coronary artery disease) 11/23/2020   GERD (gastroesophageal reflux disease)    Hyperlipidemia    Hypertension    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/02/2020   Malignant neoplasm of prostate ([place]) 06/02/2020   AKI (acute kidney injury) ([place]) 11/25/2019   Hyperphosphatemia 11/25/2019   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[place]) 08/24/2019   Hyperkalemia 54/00/8676   Metabolic bone disease 19/50/9326   Vitamin D deficiency 08/24/2019   Benign hypertension with chronic kidney disease, stage III ([place]) 05/20/2019   Decreased cardiac ejection fraction 05/20/2019   Benign hypertension with CKD (chronic kidney disease) stage IV ([place]) 05/20/2019   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 02/24/2019   Olecranon bursitis of left elbow 06/25/2018   B12 deficiency 03/22/2018   Gastroesophageal reflux disease without esophagitis 03/22/2018   Chronic renal  insufficiency, stage III (moderate) (HCC) 11/27/2015   Aortic valve disorder 07/08/2014   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   Cataract 11/2013   Essential hypertension 07/06/2012   Coronary arteriosclerosis 07/06/2012   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   Left bundle branch block 07/06/2012   Mixed  hyperlipidemia 07/06/2012   Cancer ([place]) 08/2008   S/P radiation therapy > 12 wks ago 2010    Current Outpatient Medications:    aspirin EC 81 MG tablet, Take 1 tablet (81 mg total) by mouth daily., Disp: 90 tablet, Rfl: 3   Cetirizine HCl 10 MG CAPS, Take 1 capsule by mouth daily., Disp: , Rfl:    cholestyramine light (PREVALITE) 4 g packet, Take 4 g by mouth daily., Disp: , Rfl:    dapagliflozin propanediol (FARXIGA) 10 MG TABS tablet, Take 1 tablet (10 mg total) by mouth daily before breakfast. Pharmacy note: please split bill Farxiga to [place]: [id number] RX PCN: [id number] RX GROUP: [id number] RX ID: [id number], Disp: 90 tablet, Rfl: 3   digoxin (LANOXIN) 0.125 MG tablet, Take 1 tablet (0.125 mg total) by mouth every other day., Disp: 45 tablet, Rfl: 3   enzalutamide (XTANDI) 40 MG tablet, Take 160 mg by mouth daily., Disp: , Rfl:    Evolocumab (REPATHA SURECLICK) 606 MG/ML SOAJ, INJECT 1 DOSE UNDER THE SKIN EVERY 14 DAYS, Disp: 6 mL, Rfl: 3   metoprolol succinate (TOPROL XL) 25 MG 24 hr tablet, Take 0.5 tablets (12.5 mg total) by mouth at bedtime., Disp: 45 tablet, Rfl: 3   Multiple Vitamins-Minerals (PRESERVISION/LUTEIN) CAPS, Take 1 capsule by mouth at bedtime., Disp: , Rfl:    omeprazole (PRILOSEC) 40 MG capsule, Take 40 mg by mouth daily., Disp: , Rfl:    pentoxifylline (TRENTAL) 400 MG CR tablet, Take 400 mg by mouth 3 (three) times daily with meals., Disp: , Rfl:    pregabalin (LYRICA) 75 MG capsule, Take 75 mg by mouth 3 (three) times daily., Disp: , Rfl:    rosuvastatin (CRESTOR) 5 MG tablet, Take 5 mg by mouth daily., Disp: , Rfl:    spironolactone (ALDACTONE) 25 MG tablet, Take 1 tablet (25 mg total) by mouth daily., Disp: 90 tablet, Rfl: 1   tamsulosin (FLOMAX) 0.4 MG CAPS capsule, Take 1 capsule (0.4 mg total) by mouth daily., Disp: 30 capsule, Rfl: 0   torsemide (DEMADEX) 20 MG tablet, Alternate 60 mg with 40 mg, Disp: 180 tablet, Rfl: 3   traZODone (DESYREL) 100 MG tablet,  Take 50 mg by mouth at bedtime., Disp: , Rfl:  Allergies  Allergen Reactions   Ezetimibe Other (See Comments)    Myalgia   Gabapentin Itching and Other (See Comments)    Sore throat, breakouts   Statins Other (See Comments)    Myalgias (intolerance)     Social History   Socioeconomic History   Marital status: Married    Spouse name: Not on file   Number of children: Not on file   Years of education: Not on file   Highest education level: Not on file  Occupational History   Not on file  Tobacco Use   Smoking status: Every Day    Packs/day: 0.50    Years: 50.00    Total pack years: 25.00    Types: Cigarettes   Smokeless tobacco: Never  Vaping Use   Vaping Use: Never used  Substance and Sexual Activity   Alcohol use: Not Currently   Drug use: Never   Sexual activity: Not  on file  Other Topics Concern   Not on file  Social History Narrative   Not on file   Social Determinants of Health   Financial Resource Strain: Low Risk  (05/22/2022)   Overall Financial Resource Strain (CARDIA)    Difficulty of Paying Living Expenses: Not very hard  Food Insecurity: No Food Insecurity (05/22/2022)   Hunger Vital Sign    Worried About Running Out of Food in the Last Year: Never true    Ran Out of Food in the Last Year: Never true  Transportation Needs: No Transportation Needs (05/22/2022)   PRAPARE - Hydrologist (Medical): No    Lack of Transportation (Non-Medical): No  Physical Activity: Not on file  Stress: Not on file  Social Connections: Not on file  Intimate Partner Violence: Not on file    Physical Exam      Future Appointments  Date Time Provider [place]  09/23/2022  3:40 PM [facility] DEVICE REMOTES [facility]  10/10/2022 11:45 AM Hilty, Nadean Corwin, MD [facility] None  10/11/2022  9:45 AM [facility] None  10/14/2022  9:30 AM Marice Potter, MD [facility] None  11/21/2022 10:00 AM Marzetta Board,  DPM [facility]  12/23/2022  3:40 PM [facility] DEVICE REMOTES [facility]  03/24/2023  3:40 PM [facility] DEVICE REMOTES [facility]  06/23/2023  3:40 PM [facility] DEVICE REMOTES [facility]  09/22/2023  3:40 PM [facility] DEVICE REMOTES [facility]

## 2022-09-23 ENCOUNTER — Ambulatory Visit: Payer: Medicare Other

## 2022-09-23 DIAGNOSIS — I420 Dilated cardiomyopathy: Secondary | ICD-10-CM

## 2022-09-24 LAB — CUP PACEART REMOTE DEVICE CHECK
Battery Remaining Longevity: 67 mo
Battery Remaining Percentage: 79 %
Battery Voltage: 2.98 V
Brady Statistic AP VP Percent: 63 %
Brady Statistic AP VS Percent: 1.4 %
Brady Statistic AS VP Percent: 29 %
Brady Statistic AS VS Percent: 2.4 %
Brady Statistic RA Percent Paced: 60 %
Date Time Interrogation Session: 20240205020149
HighPow Impedance: 71 Ohm
Implantable Lead Connection Status: 753985
Implantable Lead Connection Status: 753985
Implantable Lead Connection Status: 753985
Implantable Lead Implant Date: 20220919
Implantable Lead Implant Date: 20220919
Implantable Lead Implant Date: 20220919
Implantable Lead Location: 753858
Implantable Lead Location: 753859
Implantable Lead Location: 753860
Implantable Pulse Generator Implant Date: 20220919
Lead Channel Impedance Value: 460 Ohm
Lead Channel Impedance Value: 490 Ohm
Lead Channel Impedance Value: 760 Ohm
Lead Channel Pacing Threshold Amplitude: 0.5 V
Lead Channel Pacing Threshold Amplitude: 0.75 V
Lead Channel Pacing Threshold Amplitude: 1 V
Lead Channel Pacing Threshold Pulse Width: 0.5 ms
Lead Channel Pacing Threshold Pulse Width: 0.5 ms
Lead Channel Pacing Threshold Pulse Width: 0.7 ms
Lead Channel Sensing Intrinsic Amplitude: 0.9 mV
Lead Channel Sensing Intrinsic Amplitude: 12 mV
Lead Channel Setting Pacing Amplitude: 1.75 V
Lead Channel Setting Pacing Amplitude: 2 V
Lead Channel Setting Pacing Amplitude: 2.5 V
Lead Channel Setting Pacing Pulse Width: 0.5 ms
Lead Channel Setting Pacing Pulse Width: 0.7 ms
Lead Channel Setting Sensing Sensitivity: 0.5 mV
Pulse Gen Serial Number: 111046561
Zone Setting Status: 755011

## 2022-09-25 ENCOUNTER — Other Ambulatory Visit (HOSPITAL_COMMUNITY): Payer: Self-pay

## 2022-09-25 NOTE — Progress Notes (Signed)
Paramedicine Encounter    Patient ID: Tommy Ward, male    DOB: 1941-05-18, 82 y.o.   MRN: [id number]   Complaints- tremors   Assessment- a&Ox4, warm and dry, no shortness of breath, no dizziness, no chest pain. Lungs clear, no swelling or weight gain. Tremors noted in both hands.   Compliance with meds- no missed doses   Pill box filled- for one week   Refills needed- none   Meds changes since last visit- none     Social changes- none    BP 110/64   Pulse 72 Comment: irreg  Resp 16   Wt 162 lb (73.5 kg)   SpO2 95%   BMI 23.92 kg/m  Weight yesterday-164lbs Last visit weight-162lbs   Arrived for home visit for [facility] who reports to be feeling okay but reports having some ongoing issues with tremors- he is being followed by PCP for same.   He reports improvement in dizziness since moving metoprolol to 1/2 tab at bedtime. Vitals as noted in report.   Lungs clear, no swelling. Weight is 162lbs  Appointments reviewed and confirmed.   I advised Berlie to reach out if he needed anything but otherwise we would meet next week. He agreed with plan.    [name], [place]  ACTION: Home visit completed    Patient Care Team: Serita Grammes, MD as PCP - General (Family Medicine) Debara Pickett Nadean Corwin, MD as PCP - Cardiology (Cardiology) Vickie Epley, MD as PCP - Electrophysiology (Cardiology) Marice Potter, MD as Consulting Physician (Oncology) Azzie Glatter, DDS as Referring Physician (Oral Surgery) Joie Bimler, MD as Consulting Physician (Urology)  Patient Active Problem List   Diagnosis Date Noted   Anemia due to stage 4 chronic kidney disease ([place]) 03/20/2022   Hypercalcemia of malignancy 92/42/6834   Chronic systolic heart failure ([place])    Abnormal stress test 12/05/2020   Depressed left ventricular ejection fraction 12/05/2020   Nonrheumatic mitral valve regurgitation 12/05/2020   Pre-diabetes 12/05/2020   Pulmonary  hypertension ([place]) 12/05/2020   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[place]) 12/05/2020   Tobacco use 12/05/2020   Dilated cardiomyopathy ([place]) 12/05/2020   HFrEF (heart failure with reduced ejection fraction) ([place]) 11/23/2020   CAD (coronary artery disease) 11/23/2020   GERD (gastroesophageal reflux disease)    Hyperlipidemia    Hypertension    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[place]) 06/02/2020   Malignant neoplasm of prostate ([place]) 06/02/2020   AKI (acute kidney injury) ([place]) 11/25/2019   Hyperphosphatemia 11/25/2019   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[place]) 08/24/2019   Hyperkalemia 19/62/2297   Metabolic bone disease 98/92/1194   Vitamin D deficiency 08/24/2019   Benign hypertension with chronic kidney disease, stage III ([place]) 05/20/2019   Decreased cardiac ejection fraction 05/20/2019   Benign hypertension with CKD (chronic kidney disease) stage IV ([place]) 05/20/2019   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 02/24/2019   Olecranon bursitis of left elbow 06/25/2018   B12 deficiency 03/22/2018   Gastroesophageal reflux disease without esophagitis 03/22/2018   Chronic renal insufficiency, stage III (moderate) (HCC) 11/27/2015   Aortic valve disorder 07/08/2014   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   Cataract 11/2013   Essential hypertension 07/06/2012   Coronary arteriosclerosis 07/06/2012   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   Left bundle branch block 07/06/2012   Mixed hyperlipidemia 07/06/2012   Cancer ([place]) 08/2008   S/P  radiation therapy > 12 wks ago 2010    Current Outpatient Medications:    aspirin EC 81 MG tablet, Take 1 tablet (81 mg total) by mouth daily., Disp: 90 tablet, Rfl: 3   Cetirizine HCl 10 MG CAPS, Take 1 capsule by mouth daily., Disp: , Rfl:    cholestyramine light (PREVALITE) 4 g packet, Take 4 g by  mouth daily., Disp: , Rfl:    dapagliflozin propanediol (FARXIGA) 10 MG TABS tablet, Take 1 tablet (10 mg total) by mouth daily before breakfast. Pharmacy note: please split bill Farxiga to [place]: [id number] RX PCN: [id number] RX GROUP: [id number] RX ID: [id number], Disp: 90 tablet, Rfl: 3   digoxin (LANOXIN) 0.125 MG tablet, Take 1 tablet (0.125 mg total) by mouth every other day., Disp: 45 tablet, Rfl: 3   enzalutamide (XTANDI) 40 MG tablet, Take 160 mg by mouth daily., Disp: , Rfl:    Evolocumab (REPATHA SURECLICK) 263 MG/ML SOAJ, INJECT 1 DOSE UNDER THE SKIN EVERY 14 DAYS, Disp: 6 mL, Rfl: 3   metoprolol succinate (TOPROL XL) 25 MG 24 hr tablet, Take 0.5 tablets (12.5 mg total) by mouth at bedtime., Disp: 45 tablet, Rfl: 3   Multiple Vitamins-Minerals (PRESERVISION/LUTEIN) CAPS, Take 1 capsule by mouth at bedtime., Disp: , Rfl:    omeprazole (PRILOSEC) 40 MG capsule, Take 40 mg by mouth daily., Disp: , Rfl:    pentoxifylline (TRENTAL) 400 MG CR tablet, Take 400 mg by mouth 3 (three) times daily with meals., Disp: , Rfl:    pregabalin (LYRICA) 75 MG capsule, Take 75 mg by mouth 3 (three) times daily., Disp: , Rfl:    rosuvastatin (CRESTOR) 5 MG tablet, Take 5 mg by mouth daily., Disp: , Rfl:    spironolactone (ALDACTONE) 25 MG tablet, Take 1 tablet (25 mg total) by mouth daily., Disp: 90 tablet, Rfl: 1   tamsulosin (FLOMAX) 0.4 MG CAPS capsule, Take 1 capsule (0.4 mg total) by mouth daily., Disp: 30 capsule, Rfl: 0   torsemide (DEMADEX) 20 MG tablet, Alternate 60 mg with 40 mg, Disp: 180 tablet, Rfl: 3   traZODone (DESYREL) 100 MG tablet, Take 50 mg by mouth at bedtime., Disp: , Rfl:  Allergies  Allergen Reactions   Ezetimibe Other (See Comments)    Myalgia   Gabapentin Itching and Other (See Comments)    Sore throat, breakouts   Statins Other (See Comments)    Myalgias (intolerance)     Social History   Socioeconomic History   Marital status: Married    Spouse name: Not on file    Number of children: Not on file   Years of education: Not on file   Highest education level: Not on file  Occupational History   Not on file  Tobacco Use   Smoking status: Every Day    Packs/day: 0.50    Years: 50.00    Total pack years: 25.00    Types: Cigarettes   Smokeless tobacco: Never  Vaping Use   Vaping Use: Never used  Substance and Sexual Activity   Alcohol use: Not Currently   Drug use: Never   Sexual activity: Not on file  Other Topics Concern   Not on file  Social History Narrative   Not on file   Social Determinants of Health   Financial Resource Strain: Low Risk  (05/22/2022)   Overall Financial Resource Strain (CARDIA)    Difficulty of Paying Living Expenses: Not very hard  Food Insecurity: No Food Insecurity (05/22/2022)  Hunger Vital Sign    Worried About Running Out of Food in the Last Year: Never true    Ran Out of Food in the Last Year: Never true  Transportation Needs: No Transportation Needs (05/22/2022)   PRAPARE - Hydrologist (Medical): No    Lack of Transportation (Non-Medical): No  Physical Activity: Not on file  Stress: Not on file  Social Connections: Not on file  Intimate Partner Violence: Not on file    Physical Exam      Future Appointments  Date Time Provider [place]  10/10/2022 11:45 AM Pixie Casino, MD [facility] None  10/11/2022  9:45 AM [facility] None  10/14/2022  9:30 AM Marice Potter, MD [facility] None  11/21/2022 10:00 AM Marzetta Board, DPM [facility]  12/23/2022  3:40 PM [facility] DEVICE REMOTES [facility]  03/24/2023  3:40 PM [facility] DEVICE REMOTES [facility]  06/23/2023  3:40 PM [facility] DEVICE REMOTES [facility]  09/22/2023  3:40 PM [facility] DEVICE REMOTES [facility]

## 2022-10-02 ENCOUNTER — Other Ambulatory Visit (HOSPITAL_COMMUNITY): Payer: Self-pay

## 2022-10-02 NOTE — Progress Notes (Signed)
Paramedicine Encounter    Patient ID: Tommy Ward, male    DOB: [birth year], 82 y.o.   MRN: [id number]   Complaints- chronic neuropathy, tremors.   Assessment- A&Ox4, warm and dry, ambulatory with no shortness of  breath, no lower leg edema, lungs clear.   Compliance with meds- no missed doses   Pill box filled- for one week  Refills needed- none   Meds changes since last visit- none     Social changes- none    BP 116/60   Pulse 70   Resp 18   Wt 164 lb (74.4 kg)   SpO2 97%   BMI 24.22 kg/m  Weight yesterday-didn't weigh Last visit weight-162lbs  Arrived for home visit for [name] who reports to be feeling good today with the only complaints of his chronic neuropathy and tremors. He denied shortness of breath, dizziness, chest pain, weight gain. No lower leg edema noted. Lungs clear. Vitals as noted. I reviewed meds and confirmed same, pill box filled for one week. No refills needed. I reviewed and wrote down upcoming appointments. HF education provided. Home visit complete. I will see Tommy Ward in one week.    -To note: He started taking Omega 3 vitamin capsule to help with neuropathy.   [name], [place]  ACTION: Home visit completed    Patient Care Team: Serita Grammes, MD as PCP - General (Family Medicine) Debara Pickett Nadean Corwin, MD as PCP - Cardiology (Cardiology) Vickie Epley, MD as PCP - Electrophysiology (Cardiology) Marice Potter, MD as Consulting Physician (Oncology) Azzie Glatter, DDS as Referring Physician (Oral Surgery) Joie Bimler, MD as Consulting Physician (Urology)  Patient Active Problem List   Diagnosis Date Noted   Anemia due to stage 4 chronic kidney disease ([place]) 03/20/2022   Hypercalcemia of malignancy [id number]   Chronic systolic heart failure ([place])    Abnormal stress test 12/05/2020   Depressed left ventricular ejection fraction 12/05/2020   Nonrheumatic mitral valve regurgitation 12/05/2020    Pre-diabetes 12/05/2020   Pulmonary hypertension ([place]) 12/05/2020   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[place]) 12/05/2020   Tobacco use 12/05/2020   Dilated cardiomyopathy ([place]) 12/05/2020   HFrEF (heart failure with reduced ejection fraction) ([place]) 11/23/2020   CAD (coronary artery disease) 11/23/2020   GERD (gastroesophageal reflux disease)    Hyperlipidemia    Hypertension    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[place]) 06/02/2020   Malignant neoplasm of prostate ([place]) 06/02/2020   AKI (acute kidney injury) ([place]) 11/25/2019   Hyperphosphatemia 11/25/2019   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[place]) 08/24/2019   Hyperkalemia [id number]   Metabolic bone disease [id number]   Vitamin D deficiency 08/24/2019   Benign hypertension with chronic kidney disease, stage III ([place]) 05/20/2019   Decreased cardiac ejection fraction 05/20/2019   Benign hypertension with CKD (chronic kidney disease) stage IV ([place]) 05/20/2019   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 02/24/2019   Olecranon bursitis of left elbow 06/25/2018   B12 deficiency 03/22/2018   Gastroesophageal reflux disease without esophagitis 03/22/2018   Chronic renal insufficiency, stage III (moderate) (HCC) 11/27/2015   Aortic valve disorder 07/08/2014   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   Cataract 11/2013   Essential hypertension 07/06/2012   Coronary arteriosclerosis 07/06/2012   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   Left bundle branch block 07/06/2012   Mixed hyperlipidemia 07/06/2012   Cancer ([place]) 08/2008   S/P  radiation therapy > 12 wks ago 2010    Current Outpatient Medications:    aspirin EC 81 MG tablet, Take 1 tablet (81 mg total) by mouth daily., Disp: 90 tablet, Rfl: 3   Cetirizine HCl 10 MG CAPS, Take 1 capsule by mouth daily., Disp: , Rfl:    cholestyramine light  (PREVALITE) 4 g packet, Take 4 g by mouth daily., Disp: , Rfl:    dapagliflozin propanediol (FARXIGA) 10 MG TABS tablet, Take 1 tablet (10 mg total) by mouth daily before breakfast. Pharmacy note: please split bill Farxiga to [place]: [id number] RX PCN: [id number] RX GROUP: [id number] RX ID: [id number], Disp: 90 tablet, Rfl: 3   digoxin (LANOXIN) 0.125 MG tablet, Take 1 tablet (0.125 mg total) by mouth every other day., Disp: 45 tablet, Rfl: 3   enzalutamide (XTANDI) 40 MG tablet, Take 160 mg by mouth daily., Disp: , Rfl:    Evolocumab (REPATHA SURECLICK) [id number] MG/ML SOAJ, INJECT 1 DOSE UNDER THE SKIN EVERY 14 DAYS, Disp: 6 mL, Rfl: 3   metoprolol succinate (TOPROL XL) 25 MG 24 hr tablet, Take 0.5 tablets (12.5 mg total) by mouth at bedtime., Disp: 45 tablet, Rfl: 3   Multiple Vitamins-Minerals (PRESERVISION/LUTEIN) CAPS, Take 1 capsule by mouth at bedtime., Disp: , Rfl:    omeprazole (PRILOSEC) 40 MG capsule, Take 40 mg by mouth daily., Disp: , Rfl:    pentoxifylline (TRENTAL) 400 MG CR tablet, Take 400 mg by mouth 3 (three) times daily with meals., Disp: , Rfl:    pregabalin (LYRICA) 75 MG capsule, Take 75 mg by mouth 3 (three) times daily., Disp: , Rfl:    rosuvastatin (CRESTOR) 5 MG tablet, Take 5 mg by mouth daily., Disp: , Rfl:    spironolactone (ALDACTONE) 25 MG tablet, Take 1 tablet (25 mg total) by mouth daily., Disp: 90 tablet, Rfl: 1   tamsulosin (FLOMAX) 0.4 MG CAPS capsule, Take 1 capsule (0.4 mg total) by mouth daily., Disp: 30 capsule, Rfl: 0   torsemide (DEMADEX) 20 MG tablet, Alternate 60 mg with 40 mg, Disp: 180 tablet, Rfl: 3   traZODone (DESYREL) 100 MG tablet, Take 50 mg by mouth at bedtime., Disp: , Rfl:  Allergies  Allergen Reactions   Ezetimibe Other (See Comments)    Myalgia   Gabapentin Itching and Other (See Comments)    Sore throat, breakouts   Statins Other (See Comments)    Myalgias (intolerance)     Social History   Socioeconomic History   Marital status:  Married    Spouse name: Not on file   Number of children: Not on file   Years of education: Not on file   Highest education level: Not on file  Occupational History   Not on file  Tobacco Use   Smoking status: Every Day    Packs/day: 0.50    Years: 50.00    Total pack years: 25.00    Types: Cigarettes   Smokeless tobacco: Never  Vaping Use   Vaping Use: Never used  Substance and Sexual Activity   Alcohol use: Not Currently   Drug use: Never   Sexual activity: Not on file  Other Topics Concern   Not on file  Social History Narrative   Not on file   Social Determinants of Health   Financial Resource Strain: Low Risk  (05/22/2022)   Overall Financial Resource Strain (CARDIA)    Difficulty of Paying Living Expenses: Not very hard  Food Insecurity: No Food Insecurity (05/22/2022)  Hunger Vital Sign    Worried About Running Out of Food in the Last Year: Never true    Ran Out of Food in the Last Year: Never true  Transportation Needs: No Transportation Needs (05/22/2022)   PRAPARE - Hydrologist (Medical): No    Lack of Transportation (Non-Medical): No  Physical Activity: Not on file  Stress: Not on file  Social Connections: Not on file  Intimate Partner Violence: Not on file    Physical Exam      Future Appointments  Date Time Provider [place]  10/10/2022 11:45 AM Pixie Casino, MD [facility] None  10/11/2022  9:45 AM [facility] None  10/14/2022  9:30 AM Marice Potter, MD [facility] None  11/21/2022 10:00 AM Marzetta Board, DPM [facility]  12/23/2022  3:40 PM [facility] DEVICE REMOTES [facility]  03/24/2023  3:40 PM [facility] DEVICE REMOTES [facility]  06/23/2023  3:40 PM [facility] DEVICE REMOTES [facility]  09/22/2023  3:40 PM [facility] DEVICE REMOTES [facility]

## 2022-10-04 ENCOUNTER — Telehealth: Payer: Self-pay | Admitting: Internal Medicine

## 2022-10-04 NOTE — Telephone Encounter (Signed)
Spoke with patient's wife. Advised her that lipid panel and LFTs were ordered at last visit. She said he will complete before his 2/22 appointment - may be at [place], but this is accessible in [place].

## 2022-10-04 NOTE — Telephone Encounter (Signed)
Pt's wife would like a callback regarding lab orders before pt's upcoming visit on 2/22. Please advise

## 2022-10-07 LAB — HEPATIC FUNCTION PANEL
ALT: 10 IU/L (ref 0–44)
AST: 14 IU/L (ref 0–40)
Albumin: 4.2 g/dL (ref 3.7–4.7)
Alkaline Phosphatase: 86 IU/L (ref 44–121)
Bilirubin Total: 0.7 mg/dL (ref 0.0–1.2)
Bilirubin, Direct: 0.2 mg/dL (ref 0.00–0.40)
Total Protein: 6.2 g/dL (ref 6.0–8.5)

## 2022-10-07 LAB — LIPID PANEL
Chol/HDL Ratio: 2.5 ratio (ref 0.0–5.0)
Cholesterol, Total: 136 mg/dL (ref 100–199)
HDL: 55 mg/dL (ref >39)
LDL Chol Calc (NIH): 46 mg/dL (ref 0–99)
Triglycerides: 226 mg/dL — ABNORMAL HIGH (ref 0–149)
VLDL Cholesterol Cal: 35 mg/dL (ref 5–40)

## 2022-10-09 ENCOUNTER — Telehealth (HOSPITAL_COMMUNITY): Payer: Self-pay

## 2022-10-09 ENCOUNTER — Other Ambulatory Visit (HOSPITAL_COMMUNITY): Payer: Self-pay

## 2022-10-09 NOTE — Progress Notes (Signed)
Paramedicine Encounter    Patient ID: Tommy Ward, male    DOB: [birth year], 82 y.o.   MRN: [id number]   Complaints- none  Assessment- alert and oriented, ambulatory with no shortness of breath. No chest pain, dizziness, swelling, weight gain or complaints.   Compliance with meds- no missed doses.  Pill box filled- for one week   Refills needed- none   Meds changes since last visit- none     Social changes- needing assistance with repatha copay- >$400. I will reach out to Sheral Apley for same.    BP 102/60   Pulse 70   Resp 16   Wt 163 lb 9.6 oz (74.2 kg)   SpO2 98%   BMI 24.16 kg/m  Weight yesterday-didn't weigh Last visit weight-164lbs  Arrived for home visit for [facility] who reports to be feeling well. He denied any complaints at present. Vitals and assessment obtained. Lungs clear, no swelling or weight gain. No complaints of chest pain, shortness of breath, dizziness. Meds reviewed and confirmed. Pill box filled for one week. No refills needed.   We reviewed upcoming appointments and confirmed same.   He received a call stating his repatha would be >$400- I will reach out to Sheral Apley RN at Dr. Lysbeth Penner office for same.   Home visit complete. I will see Carina in one week.    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[place]) 03/20/2022   Hypercalcemia of malignancy 123456   Chronic systolic heart failure ([place])    Abnormal stress test 12/05/2020   Depressed left  ventricular ejection fraction 12/05/2020   Nonrheumatic mitral valve regurgitation 12/05/2020   Pre-diabetes 12/05/2020   Pulmonary hypertension ([place]) 12/05/2020   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[place]) 12/05/2020   Tobacco use 12/05/2020   Dilated cardiomyopathy ([place]) 12/05/2020   HFrEF (heart failure with reduced ejection fraction) ([place]) 11/23/2020   CAD (coronary artery disease) 11/23/2020   GERD (gastroesophageal reflux disease)    Hyperlipidemia    Hypertension    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/02/2020   Malignant neoplasm of prostate ([place]) 06/02/2020   AKI (acute kidney injury) ([place]) 11/25/2019   Hyperphosphatemia 11/25/2019   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[place]) 08/24/2019   Hyperkalemia XX123456   Metabolic bone disease XX123456   Vitamin D deficiency 08/24/2019   Benign hypertension with chronic kidney disease, stage III ([place]) 05/20/2019   Decreased cardiac ejection fraction 05/20/2019   Benign hypertension with CKD (chronic kidney disease) stage IV ([place]) 05/20/2019   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 02/24/2019   Olecranon bursitis of left elbow 06/25/2018   B12 deficiency 03/22/2018   Gastroesophageal reflux disease without esophagitis 03/22/2018   Chronic renal insufficiency, stage III (moderate) (HCC) 11/27/2015   Aortic valve disorder 07/08/2014   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   Cataract 11/2013   Essential hypertension 07/06/2012   Coronary arteriosclerosis 07/06/2012   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  Left bundle branch block 07/06/2012   Mixed hyperlipidemia 07/06/2012   Cancer ([place]) 08/2008   S/P radiation therapy > 12 wks ago 2010    Current Outpatient Medications:    aspirin EC 81 MG tablet, Take 1 tablet (81 mg total) by mouth daily., Disp: 90 tablet, Rfl: 3    Cetirizine HCl 10 MG CAPS, Take 1 capsule by mouth daily., Disp: , Rfl:    cholestyramine light (PREVALITE) 4 g packet, Take 4 g by mouth daily., Disp: , Rfl:    dapagliflozin propanediol (FARXIGA) 10 MG TABS tablet, Take 1 tablet (10 mg total) by mouth daily before breakfast. Pharmacy note: please split bill Farxiga to [place]: [id number] RX PCN: [id number] RX GROUP: [id number] RX ID: [id number], Disp: 90 tablet, Rfl: 3   digoxin (LANOXIN) 0.125 MG tablet, Take 1 tablet (0.125 mg total) by mouth every other day., Disp: 45 tablet, Rfl: 3   enzalutamide (XTANDI) 40 MG tablet, Take 160 mg by mouth daily., Disp: , Rfl:    Evolocumab (REPATHA SURECLICK) XX123456 MG/ML SOAJ, INJECT 1 DOSE UNDER THE SKIN EVERY 14 DAYS, Disp: 6 mL, Rfl: 3   metoprolol succinate (TOPROL XL) 25 MG 24 hr tablet, Take 0.5 tablets (12.5 mg total) by mouth at bedtime., Disp: 45 tablet, Rfl: 3   Multiple Vitamins-Minerals (PRESERVISION/LUTEIN) CAPS, Take 1 capsule by mouth at bedtime., Disp: , Rfl:    omeprazole (PRILOSEC) 40 MG capsule, Take 40 mg by mouth daily., Disp: , Rfl:    pentoxifylline (TRENTAL) 400 MG CR tablet, Take 400 mg by mouth 3 (three) times daily with meals., Disp: , Rfl:    pregabalin (LYRICA) 75 MG capsule, Take 75 mg by mouth 3 (three) times daily., Disp: , Rfl:    rosuvastatin (CRESTOR) 5 MG tablet, Take 5 mg by mouth daily., Disp: , Rfl:    spironolactone (ALDACTONE) 25 MG tablet, Take 1 tablet (25 mg total) by mouth daily., Disp: 90 tablet, Rfl: 1   tamsulosin (FLOMAX) 0.4 MG CAPS capsule, Take 1 capsule (0.4 mg total) by mouth daily., Disp: 30 capsule, Rfl: 0   torsemide (DEMADEX) 20 MG tablet, Alternate 60 mg with 40 mg, Disp: 180 tablet, Rfl: 3   traZODone (DESYREL) 100 MG tablet, Take 50 mg by mouth at bedtime., Disp: , Rfl:  Allergies  Allergen Reactions   Ezetimibe Other (See Comments)    Myalgia   Gabapentin Itching and Other (See Comments)    Sore throat, breakouts   Statins Other (See Comments)     Myalgias (intolerance)     Social History   Socioeconomic History   Marital status: Married    Spouse name: Not on file   Number of children: Not on file   Years of education: Not on file   Highest education level: Not on file  Occupational History   Not on file  Tobacco Use   Smoking status: Every Day    Packs/day: 0.50    Years: 50.00    Total pack years: 25.00    Types: Cigarettes   Smokeless tobacco: Never  Vaping Use   Vaping Use: Never used  Substance and Sexual Activity   Alcohol use: Not Currently   Drug use: Never   Sexual activity: Not on file  Other Topics Concern   Not on file  Social History Narrative   Not on file   Social Determinants of Health   Financial Resource Strain: Low Risk  (05/22/2022)   Overall Financial Resource Strain (CARDIA)  Difficulty of Paying Living Expenses: Not very hard  Food Insecurity: No Food Insecurity (05/22/2022)   Hunger Vital Sign    Worried About Running Out of Food in the Last Year: Never true    Ran Out of Food in the Last Year: Never true  Transportation Needs: No Transportation Needs (05/22/2022)   PRAPARE - Hydrologist (Medical): No    Lack of Transportation (Non-Medical): No  Physical Activity: Not on file  Stress: Not on file  Social Connections: Not on file  Intimate Partner Violence: Not on file    Physical Exam      Future Appointments  Date Time Provider [place]  10/10/2022 11:45 AM Pixie Casino, MD [facility] None  10/11/2022  9:45 AM [facility] None  10/14/2022  9:30 AM Marice Potter, MD [facility] None  11/21/2022  9:15 AM Marzetta Board, DPM [facility]  12/23/2022  3:40 PM [facility] DEVICE REMOTES [facility]  03/24/2023  3:40 PM [facility] DEVICE REMOTES [facility]  06/23/2023  3:40 PM [facility] DEVICE REMOTES [facility]  09/22/2023  3:40 PM [facility] DEVICE REMOTES [facility]  [facility]

## 2022-10-09 NOTE — Telephone Encounter (Signed)
Saw Tommy Ward in the home today and he reports he he received a call stating his repatha would be >$400. He was denied [employer] last year due to no funds, [employer] then approved a PA back in December but now they are stating it will be this amount in which [place] can't afford. I will foward to Sheral Apley RN at Dr. Lysbeth Penner office for same. He will be seen in their office tomorrow.   [name], [place] 10/09/2022

## 2022-10-10 ENCOUNTER — Encounter: Payer: Self-pay | Admitting: Internal Medicine

## 2022-10-10 ENCOUNTER — Ambulatory Visit: Payer: Medicare Other | Attending: Internal Medicine | Admitting: Internal Medicine

## 2022-10-10 VITALS — BP 112/63 | HR 71 | Ht 69.0 in | Wt 164.2 lb

## 2022-10-10 DIAGNOSIS — I5020 Unspecified systolic (congestive) heart failure: Secondary | ICD-10-CM | POA: Diagnosis present

## 2022-10-10 DIAGNOSIS — I2510 Atherosclerotic heart disease of native coronary artery without angina pectoris: Secondary | ICD-10-CM

## 2022-10-10 DIAGNOSIS — E7849 Other hyperlipidemia: Secondary | ICD-10-CM | POA: Diagnosis present

## 2022-10-10 NOTE — Telephone Encounter (Signed)
[employer] Grant info given to patient today at his appointment

## 2022-10-10 NOTE — Telephone Encounter (Signed)
Renewed healthwell grant  START DATE 09/22/2022   END DATE 09/22/2023  Pharmacy Card CARD NO. [id number]   CARD STATUS Active   BIN [id number]   PCN [id number]   PC GROUP [id number]   PROVIDER PDMI   PROCESSOR PDMI

## 2022-10-10 NOTE — Patient Instructions (Signed)
Medication Instructions:  NO CHANGES  *If you need a refill on your cardiac medications before your next appointment, please call your pharmacy*   Lab Work: FASTING lab work to check cholesterol in 1 year   If you have labs (blood work) drawn today and your tests are completely normal, you will receive your results only by: [place] (if you have MyChart) OR A paper copy in the mail If you have any lab test that is abnormal or we need to change your treatment, we will call you to review the results.    Follow-Up: At [facility], you and your health needs are our priority.  As part of our continuing mission to provide you with exceptional heart care, we have created designated Provider Care Teams.  These Care Teams include your primary Cardiologist (physician) and Advanced Practice Providers (APPs -  Physician Assistants and Nurse Practitioners) who all work together to provide you with the care you need, when you need it.  We recommend signing up for the patient portal called "MyChart".  Sign up information is provided on this After Visit Summary.  MyChart is used to connect with patients for Virtual Visits (Telemedicine).  Patients are able to view lab/test results, encounter notes, upcoming appointments, etc.  Non-urgent messages can be sent to your provider as well.   To learn more about what you can do with MyChart, go to [email or web address].    Your next appointment:    12 months with Dr. Debara Pickett

## 2022-10-10 NOTE — Progress Notes (Signed)
LIPID CLINIC CONSULT NOTE  Chief Complaint:  Follow-up dyslipidemia  Primary Care Physician: Serita Grammes, MD  Primary Cardiologist:  Pixie Casino, MD  HPI:  Tommy Ward is a 82 y.o. male who is being seen today for the evaluation of dyslipidemia at the request of Serita Grammes, MD.  This is a pleasant 82 year old male patient of Dr. 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  Tommy Ward has a [place] heritage however he says most likely inherited is coronary artery disease and dyslipidemia from his mom and sister, both had high cholesterol though his mother died at age 21.  He was originally from [place] and was living in [place] prior to coming to [place].  He saw a cardiologist there named Dr. Erling Cruz in [place].  At one point he had stress testing which indicated a fixed defect suggestive of scar.  His LVEF was reduced between 40 to 45%.  Subsequently here he is also had repeat stress testing and echo which are confirmed evidence of prior heart attack.  This being said, he has never had a prior heart catheterization.  EKG today shows sinus rhythm with marked sinus arrhythmia and left bundle branch block, performed due to his coronary history which was not well elucidated in the chart.  He was also noted to be irregularly irregular on exam.  Most recently his lipid showed total cholesterol 400, triglycerides 295, HDL 51 and LDL 290.  In addition to his marked dyslipidemia, he reports significant statin intolerance to multiple different statins.  12/30/2019  Tommy Ward returns today for follow-up.  Overall he is doing well on Repatha.  He has had marked reduction in his lipids.  Most recently his lipids showed total cholesterol 255, triglycerides 398, HDL 44 and LDL 131.  This is down from 290.  Total cholesterol was over 400.  He denies any side effects with the medication.  I would like to see if we could drive his  cholesterol a little lower.  We talked about different options including possibly adding ezetimibe or Vascepa.  Cost may be an issue as the PCSK9 inhibitor is expensive.  07/28/2020  Tommy Ward is seen today in follow-up.  Unfortunately he was unable to tolerate ezetimibe which caused similar side effects as the statins.  Despite this though he has made some dietary changes and is more active.  His lipids are actually even better now with total of 193, triglycerides still elevated 261, HDL 52 and LDL 89.  This is on monotherapy with Repatha.  10/22/2021  Tommy Ward returns today for follow-up.  He just had repeat labs drawn.  He reports compliance with Repatha.  He saw Dr. Aundra Dubin in January.  Updated labs show total cholesterol 308, triglycerides 297, HDL 44 and LDL 205.  His LDL 3 months ago was 159 and 9 months prior to that was down to 38, which showed excellent control.  His LDL has been as high as 290 in the past.  I suspect there must be a significant dietary component to this.  Although he reports compliance with the medication and its very hard to believe his LDL is quite that high with it being significantly lower.  That being said he remains well above target.  Cannot tolerate statins or ezetimibe.  06/18/2022  Tommy Ward seen today for follow-up.  He had repeat lipids recently which showed actually some worsening of his cholesterol.  Total cholesterol 210, triglycerides 301, HDL  36 and LDL 113.  Interestingly his lipids were essentially normal in May 2022.  After speaking with him today to try to figure out why this was the issue, seems like that he was much more physically active than.  He is struggled some with neuropathy and is not riding his exercise bike regularly.  He definitely needs to resume this.  I do not see any significant improvement with adding Nexletol to the [place].  The Repatha is generally because most of his lipid reduction.  We talked about possibly going back to statins.   Although he had some hesitancy about this he did seem amenable to a low-dose of a statin and will try low-dose rosuvastatin.  10/10/2022  Tommy Ward is seen today for follow-up.  Overall he seems to be doing well on his current regimen of Repatha and low-dose rosuvastatin.  He had repeat lipids showing total cholesterol 136, triglycerides 226, HDL 55 and LDL 46.  We were able to renew his grant for the health well foundation.  Although his triglycerides remain a little elevated, I do not feel strongly about aggressive treatment of this.  We did discuss options including Vascepa which showed some very promising initial data however there have been some questions about the true efficacy of the product.  He does however have some peripheral neuropathy and was suggested to try omega-3's and it may be reasonable to consider an over-the-counter omega-3 which is more cost effective that may also help his triglycerides.   PMHx:  Past Medical History:  Diagnosis Date   Abnormal stress test 12/05/2020   AKI (acute kidney injury) ([place]) 11/25/2019   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[place]) 08/24/2019   Anemia due to stage 4 chronic kidney disease ([place]) 03/20/2022   Aortic valve disorder 07/08/2014   B12 deficiency 03/22/2018   Benign hypertension with chronic kidney disease, stage III ([place]) 05/20/2019   Benign hypertension with CKD (chronic kidney disease) stage IV ([place]) 05/20/2019   CAD (coronary artery disease) 11/23/2020   Cancer ([place]) 08/2008   prostate ca   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   Cataract 11/2013   bilateral   Chronic renal insufficiency, stage III (moderate) (HCC) A999333   Chronic systolic heart failure (HCC)    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 02/24/2019   Coronary arteriosclerosis 07/06/2012   Decreased cardiac ejection fraction 05/20/2019   Depressed left ventricular ejection fraction 12/05/2020   Dilated cardiomyopathy ([place]) 12/05/2020   Essential  hypertension 07/06/2012   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   Gastroesophageal reflux disease without esophagitis 03/22/2018   GERD (gastroesophageal reflux disease)    HFrEF (heart failure with reduced ejection fraction) ([place]) 11/23/2020   Hypercalcemia of malignancy 02/16/2021   Hyperkalemia 08/24/2019   Hyperlipidemia    Hyperphosphatemia 11/25/2019   Hypertension    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   Left bundle branch block 07/06/2012   Malignant neoplasm of prostate ([place])    Malignant neoplasm of prostate ([place])    Metabolic bone disease [id number]   Mixed hyperlipidemia 07/06/2012   Nonrheumatic mitral valve regurgitation 12/05/2020   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   Olecranon bursitis of left elbow 06/25/2018   Pre-diabetes 12/05/2020   Pulmonary hypertension ([place]) 12/05/2020   S/P radiation therapy > 12 wks ago 2010   prostate CA   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[place])    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[place]) 12/05/2020   Tobacco use 12/05/2020   Vitamin D deficiency 08/24/2019  Past Surgical History:  Procedure Laterality Date   BIV ICD INSERTION CRT-D N/A 05/07/2021   Procedure: BIV ICD INSERTION CRT-D;  Surgeon: Vickie Epley, MD;  Location: [facility];  Service: Cardiovascular;  Laterality: N/A;   CHOLECYSTECTOMY  01/2003   lap chole   left hip repaired     RIGHT/LEFT HEART CATH AND CORONARY ANGIOGRAPHY N/A 12/08/2020   Procedure: RIGHT/LEFT HEART CATH AND CORONARY ANGIOGRAPHY;  Surgeon: Wellington Hampshire, MD;  Location: [facility];  Service: Cardiovascular;  Laterality: N/A;   TEE WITHOUT CARDIOVERSION N/A 01/26/2021   Procedure: TRANSESOPHAGEAL ECHOCARDIOGRAM (TEE);  Surgeon: Larey Dresser, MD;  Location: [facility] ENDOSCOPY;  Service: Cardiovascular;  Laterality: N/A;    FAMHx:  Family History  Problem Relation Age of Onset   Leukemia Mother    Prostate cancer  Father    Heart attack Brother    Colon cancer Neg Hx    Rectal cancer Neg Hx    Stomach cancer Neg Hx    Esophageal cancer Neg Hx     SOCHx:   reports that he has been smoking cigarettes. He has a 25.00 pack-year smoking history. He has never used smokeless tobacco. He reports that he does not currently use alcohol. He reports that he does not use drugs.  ALLERGIES:  Allergies  Allergen Reactions   Ezetimibe Other (See Comments)    Myalgia   Gabapentin Itching and Other (See Comments)    Sore throat, breakouts   Statins Other (See Comments)    Myalgias (intolerance)    ROS: Pertinent items noted in HPI and remainder of comprehensive ROS otherwise negative.  HOME MEDS: Current Outpatient Medications on File Prior to Visit  Medication Sig Dispense Refill   aspirin EC 81 MG tablet Take 1 tablet (81 mg total) by mouth daily. 90 tablet 3   Cetirizine HCl 10 MG CAPS Take 1 capsule by mouth daily.     cholestyramine light (PREVALITE) 4 g packet Take 4 g by mouth daily.     dapagliflozin propanediol (FARXIGA) 10 MG TABS tablet Take 1 tablet (10 mg total) by mouth daily before breakfast. Pharmacy note: please split bill Farxiga to [place]: [id number] RX PCN: [id number] RX GROUP: [id number] RX ID: [id number] 90 tablet 3   digoxin (LANOXIN) 0.125 MG tablet Take 1 tablet (0.125 mg total) by mouth every other day. 45 tablet 3   enzalutamide (XTANDI) 40 MG tablet Take 160 mg by mouth daily.     Evolocumab (REPATHA SURECLICK) XX123456 MG/ML SOAJ INJECT 1 DOSE UNDER THE SKIN EVERY 14 DAYS 6 mL 3   metoprolol succinate (TOPROL XL) 25 MG 24 hr tablet Take 0.5 tablets (12.5 mg total) by mouth at bedtime. 45 tablet 3   Multiple Vitamins-Minerals (PRESERVISION/LUTEIN) CAPS Take 1 capsule by mouth at bedtime.     omeprazole (PRILOSEC) 40 MG capsule Take 40 mg by mouth daily.     pentoxifylline (TRENTAL) 400 MG CR tablet Take 400 mg by mouth 3 (three) times daily with meals.     pregabalin (LYRICA) 75 MG  capsule Take 75 mg by mouth 3 (three) times daily.     rosuvastatin (CRESTOR) 5 MG tablet Take 5 mg by mouth daily.     spironolactone (ALDACTONE) 25 MG tablet Take 1 tablet (25 mg total) by mouth daily. 90 tablet 1   tamsulosin (FLOMAX) 0.4 MG CAPS capsule Take 1 capsule (0.4 mg total) by mouth daily. 30 capsule 0   torsemide (DEMADEX) 20  MG tablet Alternate 60 mg with 40 mg 180 tablet 3   traZODone (DESYREL) 100 MG tablet Take 50 mg by mouth at bedtime.     No current facility-administered medications on file prior to visit.    LABS/IMAGING: No results found for this or any previous visit (from the past 48 hour(s)).  No results found.  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   Tommy Ward has had substantial improvement in his lipids with LDL now down to 46.  Triglycerides remain a little elevated at 226.  He may start over-the-counter fish oil for both neuropathy and his high triglycerides.  Overall would not recommend additional changes to medications at this time.  Plan follow-up with me annually or sooner as necessary.  Pixie Casino, MD, [facility], [place] Director of the Advanced Lipid Disorders &  Cardiovascular Risk Reduction Clinic Diplomate of the American Board of Clinical Lipidology Attending  Cardiologist  Direct Dial: [phone]  Fax: [phone]  Website:  [email or web address] 10/10/2022, 11:59 AM

## 2022-10-11 ENCOUNTER — Inpatient Hospital Stay: Payer: Medicare Other | Attending: Oncology

## 2022-10-11 DIAGNOSIS — C7951 Secondary malignant neoplasm of bone: Secondary | ICD-10-CM | POA: Insufficient documentation

## 2022-10-11 DIAGNOSIS — C61 Malignant neoplasm of prostate: Secondary | ICD-10-CM | POA: Insufficient documentation

## 2022-10-11 LAB — PSA: Prostatic Specific Antigen: 0.02 ng/mL (ref 0.00–4.00)

## 2022-10-13 NOTE — Progress Notes (Signed)
[facility]  [address] [place],  [place]  [address] [phone]  Clinic Day:  10/14/2022  Referring physician: Serita Grammes, MD   HISTORY OF PRESENT ILLNESS:  The patient is an 82 y.o. 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  He is supposed to be on Trelstar as well, but has not had a Trelstar shot at his urologist's office in numerous months.  He comes in today for routine followup.  Since his last visit, the patient has been doing well.  He denies having any systemic symptoms which concern him for overt progression of his metastatic prostate cancer.  PHYSICAL EXAM:  Blood pressure 109/71, pulse 70, temperature 98.8 F (37.1 C), resp. rate 16, height '5\' 9"'$  (1.753 m), weight 162 lb 8 oz (73.7 kg), SpO2 96 %. Wt Readings from Last 3 Encounters:  10/14/22 162 lb 8 oz (73.7 kg)  10/10/22 164 lb 3.2 oz (74.5 kg)  10/09/22 163 lb 9.6 oz (74.2 kg)   Body mass index is 24 kg/m. Performance status (ECOG): 1 - Symptomatic but completely ambulatory Physical Exam Constitutional:      Appearance: Normal appearance. He is not ill-appearing.  HENT:     Mouth/Throat:     Mouth: Mucous membranes are moist.     Pharynx: Oropharynx is clear. No oropharyngeal exudate or posterior oropharyngeal erythema.  Cardiovascular:     Rate and Rhythm: Normal rate and regular rhythm.     Heart sounds: No murmur heard.    No friction rub. No gallop.  Pulmonary:     Effort: Pulmonary effort is normal. No respiratory distress.     Breath sounds: Normal breath sounds. No wheezing, rhonchi or rales.  Abdominal:     General: Bowel sounds are normal. There is no distension.     Palpations: Abdomen is soft. There is no mass.     Tenderness: There is no abdominal tenderness.  Musculoskeletal:        General: No swelling.     Right lower leg: No edema.     Left lower leg: No edema.  Lymphadenopathy:     Cervical: No  cervical adenopathy.     Upper Body:     Right upper body: No supraclavicular or axillary adenopathy.     Left upper body: No supraclavicular or axillary adenopathy.     Lower Body: No right inguinal adenopathy. No left inguinal adenopathy.  Skin:    General: Skin is warm.     Coloration: Skin is not jaundiced.     Findings: No lesion or rash.  Neurological:     General: No focal deficit present.     Mental Status: He is alert and oriented to person, place, and time. Mental status is at baseline.  Psychiatric:        Mood and Affect: Mood normal.        Behavior: Behavior normal.        Thought Content: Thought content normal.    LABS:    Latest Reference Range & Units 10/11/22 09:14  Prostatic Specific Antigen 0.00 - 4.00 ng/mL 0.02    ASSESSMENT & PLAN:  Assessment/Plan:  An 82 y.o. male with metastatic prostate cancer.  I am very pleased as his PSA remains at an undetectable level (<.1).  For now, he will continue taking his enzalutamide daily as it appears to be effective as a single agent in controlling his disease.  Clinically, the patient is doing well.  I will see him back in 4 months for repeat clinical assessment.  The patient understands all the plans discussed today and is in agreement with them.    Tamatha Gadbois Macarthur Critchley, MD

## 2022-10-14 ENCOUNTER — Other Ambulatory Visit: Payer: Self-pay | Admitting: Oncology

## 2022-10-14 ENCOUNTER — Inpatient Hospital Stay (INDEPENDENT_AMBULATORY_CARE_PROVIDER_SITE_OTHER): Payer: Medicare Other | Admitting: Oncology

## 2022-10-14 ENCOUNTER — Telehealth: Payer: Self-pay | Admitting: Oncology

## 2022-10-14 VITALS — BP 109/71 | HR 70 | Temp 98.8°F | Resp 16 | Ht 69.0 in | Wt 162.5 lb

## 2022-10-14 DIAGNOSIS — C61 Malignant neoplasm of prostate: Secondary | ICD-10-CM | POA: Diagnosis not present

## 2022-10-14 NOTE — Telephone Encounter (Signed)
10/14/22 Next appt scheduled and confirmed with patient

## 2022-10-16 ENCOUNTER — Other Ambulatory Visit (HOSPITAL_COMMUNITY): Payer: Self-pay

## 2022-10-16 NOTE — Progress Notes (Signed)
Met with Yetza[facility] today for a med rec in the home today. Meds reviewed and confirmed. Pill box filled for one week. No refills.   Has been having some cold and flu symptoms for two days- using OTC meds with improvement.   I will see him in one week. Home visit complete.   [name], [place] 10/16/2022

## 2022-10-24 ENCOUNTER — Telehealth (HOSPITAL_COMMUNITY): Payer: Self-pay

## 2022-10-24 NOTE — Telephone Encounter (Signed)
Spoke to [facility] wife who reports he is running out of his Gillermina Phy and needs a refill. I was able to talk with [facility] and get that set up for refill and delivery for tomorrow. [name] Pulse made aware. I will follow up in the home next week.   [name], [place] 10/24/2022

## 2022-10-30 ENCOUNTER — Other Ambulatory Visit (HOSPITAL_COMMUNITY): Payer: Self-pay

## 2022-10-30 NOTE — Progress Notes (Signed)
Paramedicine Encounter    Patient ID: Tommy Ward, male    DOB: 07-28-1941, 82 y.o.   MRN: [id number]   Complaints- none   Assessment- CAOx4, warm and dry, ambulatory without shortness of breath, denied chest pain, dizziness, denied shortness of breath upon rest and exertion.   Compliance with meds- no missed doses   Pill box filled- for one week  Refills needed-  Pentoxifylline Farxiga     Meds changes since last visit- none     Social changes- none    BP 122/60   Pulse 70   Resp 16   Wt 163 lb (73.9 kg)   SpO2 99%   BMI 24.07 kg/m  Weight yesterday-didn't weigh  Last visit weight-- 162lbs   Arrived for home visit for [facility] who reports to be feeling well. He denied any complaints. He says he has had no dizzy spells, chest pain or shortness of breath. I obtained vitals and assessment. No lower leg swelling, lungs clear. I reviewed meds and confirmed same. 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 Refills noted and called in. We reviewed appointments and confirmed same. I plan to come out in one week for home visit. He agreed with plan.     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[place]) 03/20/2022   Hypercalcemia of malignancy 123456   Chronic systolic heart failure ([place])    Abnormal stress test 12/05/2020   Depressed left ventricular ejection  fraction 12/05/2020   Nonrheumatic mitral valve regurgitation 12/05/2020   Pre-diabetes 12/05/2020   Pulmonary hypertension ([place]) 12/05/2020   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[place]) 12/05/2020   Tobacco use 12/05/2020   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   HFrEF (heart failure with reduced ejection fraction) ([place]) 11/23/2020   CAD (coronary artery disease) 11/23/2020   GERD (gastroesophageal reflux disease)    Hyperlipidemia    Hypertension    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/2020   Malignant neoplasm of prostate ([place]) 06/02/2020   AKI (acute kidney injury) ([place]) 11/25/2019   Hyperphosphatemia 11/25/2019   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[place]) 08/24/2019   Hyperkalemia XX123456   Metabolic bone disease XX123456   Vitamin D deficiency 08/24/2019   Benign hypertension with chronic kidney disease, stage III ([place]) 05/20/2019   Decreased cardiac ejection fraction 05/20/2019   Benign hypertension with CKD (chronic kidney disease) stage IV ([place]) 05/20/2019   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 02/24/2019   Olecranon bursitis of left elbow 06/25/2018   B12 deficiency 03/22/2018   Gastroesophageal reflux disease without esophagitis 03/22/2018   Chronic renal insufficiency, stage III (moderate) (HCC) 11/27/2015   Aortic valve disorder 07/08/2014   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   Cataract 11/2013  Essential hypertension 07/06/2012   Coronary arteriosclerosis 07/06/2012   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   Left bundle branch block 07/06/2012   Mixed hyperlipidemia 07/06/2012   Cancer ([place]) 08/2008   S/P radiation therapy > 12 wks ago 2010    Current Outpatient Medications:    aspirin EC 81 MG tablet, Take 1 tablet (81 mg total) by mouth daily., Disp: 90 tablet, Rfl: 3   Cetirizine HCl 10 MG  CAPS, Take 1 capsule by mouth daily., Disp: , Rfl:    cholestyramine light (PREVALITE) 4 g packet, Take 4 g by mouth daily., Disp: , Rfl:    dapagliflozin propanediol (FARXIGA) 10 MG TABS tablet, Take 1 tablet (10 mg total) by mouth daily before breakfast. Pharmacy note: please split bill Farxiga to [place]: [id number] RX PCN: [id number] RX GROUP: [id number] RX ID: [id number], Disp: 90 tablet, Rfl: 3   digoxin (LANOXIN) 0.125 MG tablet, Take 1 tablet (0.125 mg total) by mouth every other day., Disp: 45 tablet, Rfl: 3   enzalutamide (XTANDI) 40 MG tablet, Take 160 mg by mouth daily., Disp: , Rfl:    Evolocumab (REPATHA SURECLICK) XX123456 MG/ML SOAJ, INJECT 1 DOSE UNDER THE SKIN EVERY 14 DAYS, Disp: 6 mL, Rfl: 3   metoprolol succinate (TOPROL XL) 25 MG 24 hr tablet, Take 0.5 tablets (12.5 mg total) by mouth at bedtime., Disp: 45 tablet, Rfl: 3   Multiple Vitamins-Minerals (PRESERVISION/LUTEIN) CAPS, Take 1 capsule by mouth at bedtime., Disp: , Rfl:    omeprazole (PRILOSEC) 40 MG capsule, Take 40 mg by mouth daily., Disp: , Rfl:    pentoxifylline (TRENTAL) 400 MG CR tablet, Take 400 mg by mouth 3 (three) times daily with meals., Disp: , Rfl:    pregabalin (LYRICA) 75 MG capsule, Take 75 mg by mouth 3 (three) times daily., Disp: , Rfl:    rosuvastatin (CRESTOR) 5 MG tablet, Take 5 mg by mouth daily., Disp: , Rfl:    spironolactone (ALDACTONE) 25 MG tablet, Take 1 tablet (25 mg total) by mouth daily., Disp: 90 tablet, Rfl: 1   tamsulosin (FLOMAX) 0.4 MG CAPS capsule, Take 1 capsule (0.4 mg total) by mouth daily., Disp: 30 capsule, Rfl: 0   torsemide (DEMADEX) 20 MG tablet, Alternate 60 mg with 40 mg, Disp: 180 tablet, Rfl: 3   traZODone (DESYREL) 100 MG tablet, Take 50 mg by mouth at bedtime., Disp: , Rfl:  Allergies  Allergen Reactions   Ezetimibe Other (See Comments)    Myalgia   Gabapentin Itching and Other (See Comments)    Sore throat, breakouts   Statins Other (See Comments)    Myalgias  (intolerance)     Social History   Socioeconomic History   Marital status: Married    Spouse name: Not on file   Number of children: Not on file   Years of education: Not on file   Highest education level: Not on file  Occupational History   Not on file  Tobacco Use   Smoking status: Every Day    Packs/day: 0.50    Years: 50.00    Total pack years: 25.00    Types: Cigarettes   Smokeless tobacco: Never  Vaping Use   Vaping Use: Never used  Substance and Sexual Activity   Alcohol use: Not Currently   Drug use: Never   Sexual activity: Not on file  Other Topics Concern   Not on file  Social History Narrative   Not on file   Social Determinants  of Health   Financial Resource Strain: Low Risk  (05/22/2022)   Overall Financial Resource Strain (CARDIA)    Difficulty of Paying Living Expenses: Not very hard  Food Insecurity: No Food Insecurity (05/22/2022)   Hunger Vital Sign    Worried About Running Out of Food in the Last Year: Never true    Ran Out of Food in the Last Year: Never true  Transportation Needs: No Transportation Needs (05/22/2022)   PRAPARE - Hydrologist (Medical): No    Lack of Transportation (Non-Medical): No  Physical Activity: Not on file  Stress: Not on file  Social Connections: Not on file  Intimate Partner Violence: Not on file    Physical Exam      Future Appointments  Date Time Provider [place]  11/21/2022  9:15 AM Marzetta Board, DPM [facility]  12/23/2022  3:40 PM [facility] DEVICE REMOTES [facility]  02/11/2023  9:30 AM [facility] None  02/12/2023  9:30 AM Marice Potter, MD [facility] None  03/24/2023  3:40 PM [facility] DEVICE REMOTES [facility]  06/23/2023  3:40 PM [facility] DEVICE REMOTES [facility]  09/22/2023  3:40 PM [facility] DEVICE REMOTES [facility]

## 2022-10-31 ENCOUNTER — Telehealth (HOSPITAL_COMMUNITY): Payer: Self-pay

## 2022-10-31 NOTE — Telephone Encounter (Signed)
Spoke to [employer]- they did not have the [employer] on file for Mr. Tommy Ward. I provided them the information and they report it will get sent to their tech's to add into his file and update copay will be posted to his account. I will make his wife aware and ensure he should be able to request a refill on his account by Monday. Call complete.   [name], [place] 10/31/2022

## 2022-11-06 ENCOUNTER — Other Ambulatory Visit (HOSPITAL_COMMUNITY): Payer: Self-pay

## 2022-11-06 NOTE — Progress Notes (Signed)
Paramedicine Encounter    Patient ID: Tommy Ward, male    DOB: 1940-09-11, 82 y.o.   MRN: [id number]   Arrived for home visit for Tommy Ward who reports to be feeling well with no complaints of shortness of breath, dizziness, chest pain, swelling. I obtained vitals, reviewed same. Medications reviewed and confirmed pill box filled for one week. We reviewed upcoming appointments. No refills needed.   Dr. Hardie Shackleton stopped Pentoxifylline as of this week. I removed same and will place note in med list.   REFILLS- none  I plan to see Tommy Ward in two weeks. His wife agreed to complete pill box next week with written instructions. Home visit complete.   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/05/2020   HFrEF (heart failure with reduced ejection fraction) ([place]) 11/23/2020   CAD (coronary artery disease) 11/23/2020   GERD (gastroesophageal reflux disease)    Hyperlipidemia    Hypertension    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[place])  06/02/2020   Malignant neoplasm of prostate ([place]) 06/02/2020   AKI (acute kidney injury) ([place]) 11/25/2019   Hyperphosphatemia 11/25/2019   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[place]) 08/24/2019   Hyperkalemia XX123456   Metabolic bone disease XX123456   Vitamin D deficiency 08/24/2019   Benign hypertension with chronic kidney disease, stage III ([place]) 05/20/2019   Decreased cardiac ejection fraction 05/20/2019   Benign hypertension with CKD (chronic kidney disease) stage IV ([place]) 05/20/2019   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 02/24/2019   Olecranon bursitis of left elbow 06/25/2018   B12 deficiency 03/22/2018   Gastroesophageal reflux disease without esophagitis 03/22/2018   Chronic renal insufficiency, stage III (moderate) (HCC) 11/27/2015   Aortic valve disorder 07/08/2014   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   Cataract 11/2013   Essential hypertension 07/06/2012   Coronary arteriosclerosis 07/06/2012   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   Left bundle branch block 07/06/2012   Mixed hyperlipidemia 07/06/2012   Cancer ([place]) 08/2008   S/P radiation therapy > 12 wks ago 2010    Current Outpatient Medications:    aspirin EC 81 MG tablet, Take 1 tablet (81 mg total) by mouth daily., Disp: 90 tablet, Rfl: 3   Cetirizine HCl 10 MG CAPS, Take 1 capsule by mouth daily., Disp: , Rfl:    cholestyramine light (PREVALITE) 4 g packet, Take 4 g by mouth daily., Disp: , Rfl:    dapagliflozin propanediol (FARXIGA) 10 MG TABS tablet, Take 1 tablet (10 mg total) by mouth daily before breakfast. Pharmacy note: please split bill Farxiga to [place]: [id number]  RX PCN: [id number] RX GROUP: [id number] RX ID: [id number], Disp: 90 tablet, Rfl: 3   digoxin (LANOXIN) 0.125 MG tablet, Take 1 tablet (0.125 mg total) by mouth every other day., Disp: 45 tablet, Rfl: 3   enzalutamide (XTANDI) 40 MG tablet, Take 160 mg  by mouth daily., Disp: , Rfl:    Evolocumab (REPATHA SURECLICK) XX123456 MG/ML SOAJ, INJECT 1 DOSE UNDER THE SKIN EVERY 14 DAYS, Disp: 6 mL, Rfl: 3   metoprolol succinate (TOPROL XL) 25 MG 24 hr tablet, Take 0.5 tablets (12.5 mg total) by mouth at bedtime., Disp: 45 tablet, Rfl: 3   Multiple Vitamins-Minerals (PRESERVISION/LUTEIN) CAPS, Take 1 capsule by mouth at bedtime., Disp: , Rfl:    omeprazole (PRILOSEC) 40 MG capsule, Take 40 mg by mouth daily., Disp: , Rfl:    pentoxifylline (TRENTAL) 400 MG CR tablet, Take 400 mg by mouth 3 (three) times daily with meals., Disp: , Rfl:    pregabalin (LYRICA) 75 MG capsule, Take 75 mg by mouth 3 (three) times daily., Disp: , Rfl:    rosuvastatin (CRESTOR) 5 MG tablet, Take 5 mg by mouth daily., Disp: , Rfl:    spironolactone (ALDACTONE) 25 MG tablet, Take 1 tablet (25 mg total) by mouth daily., Disp: 90 tablet, Rfl: 1   tamsulosin (FLOMAX) 0.4 MG CAPS capsule, Take 1 capsule (0.4 mg total) by mouth daily., Disp: 30 capsule, Rfl: 0   torsemide (DEMADEX) 20 MG tablet, Alternate 60 mg with 40 mg, Disp: 180 tablet, Rfl: 3   traZODone (DESYREL) 100 MG tablet, Take 50 mg by mouth at bedtime., Disp: , Rfl:  Allergies  Allergen Reactions   Ezetimibe Other (See Comments)    Myalgia   Gabapentin Itching and Other (See Comments)    Sore throat, breakouts   Statins Other (See Comments)    Myalgias (intolerance)     Social History   Socioeconomic History   Marital status: Married    Spouse name: Not on file   Number of children: Not on file   Years of education: Not on file   Highest education level: Not on file  Occupational History   Not on file  Tobacco Use   Smoking status: Every Day    Packs/day: 0.50    Years: 50.00    Additional pack years: 0.00    Total pack years: 25.00    Types: Cigarettes   Smokeless tobacco: Never  Vaping Use   Vaping Use: Never used  Substance and Sexual Activity   Alcohol use: Not Currently   Drug use: Never   Sexual  activity: Not on file  Other Topics Concern   Not on file  Social History Narrative   Not on file   Social Determinants of Health   Financial Resource Strain: Low Risk  (05/22/2022)   Overall Financial Resource Strain (CARDIA)    Difficulty of Paying Living Expenses: Not very hard  Food Insecurity: No Food Insecurity (05/22/2022)   Hunger Vital Sign    Worried About Running Out of Food in the Last Year: Never true    Ran Out of Food in the Last Year: Never true  Transportation Needs: No Transportation Needs (05/22/2022)   PRAPARE - Hydrologist (Medical): No    Lack of Transportation (Non-Medical): No  Physical Activity: Not on file  Stress: Not on file  Social Connections: Not on file  Intimate Partner Violence: Not on file    Physical Exam      Future  Appointments  Date Time Provider [place]  11/21/2022  9:15 AM Marzetta Board, DPM [facility]  12/23/2022  3:40 PM [facility] DEVICE REMOTES [facility]  02/11/2023  9:30 AM [facility] None  02/12/2023  9:30 AM Marice Potter, MD [facility] None  03/24/2023  3:40 PM [facility] DEVICE REMOTES [facility]  06/23/2023  3:40 PM [facility] DEVICE REMOTES [facility]  09/22/2023  3:40 PM [facility] DEVICE REMOTES [facility]     ACTION: Home visit completed

## 2022-11-11 NOTE — Progress Notes (Signed)
Remote ICD transmission.   

## 2022-11-13 ENCOUNTER — Telehealth: Payer: Self-pay | Admitting: Internal Medicine

## 2022-11-13 NOTE — Telephone Encounter (Signed)
PA for nexletol submitted via CMM (Key: [id number])

## 2022-11-17 IMAGING — US US ABDOMEN LIMITED
1 series · 14 of 25 positions shown · non-contrast
Comparison: None.

CLINICAL DATA: Elevated liver enzymes.  History of cholecystectomy.

EXAM:
ULTRASOUND ABDOMEN LIMITED RIGHT UPPER QUADRANT

[Series 1: us abdomen limited ruq (liver/gb) · 14 of 40 slices shown]
[im 1/40]
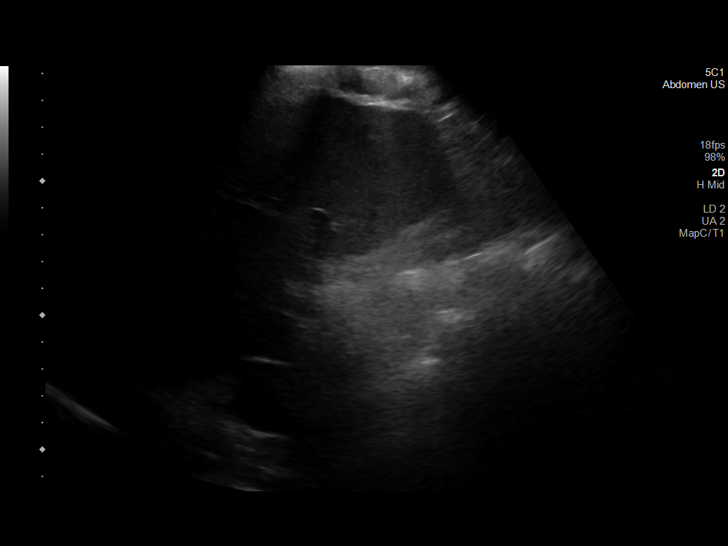
[im 4/40]
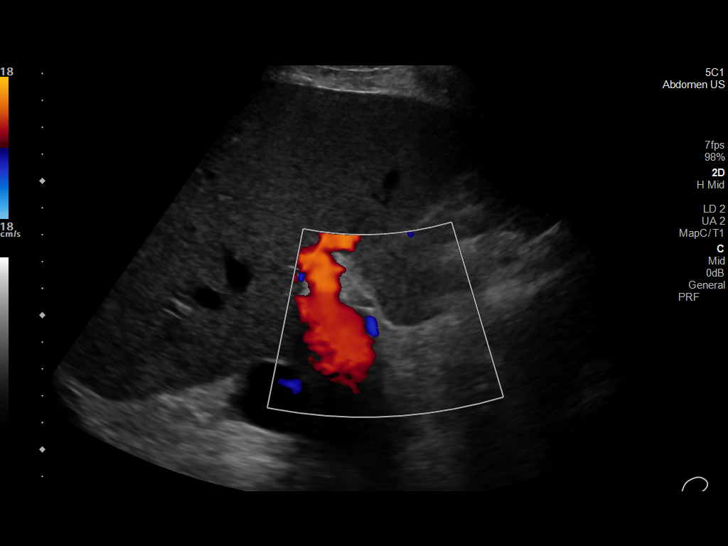
[im 7/40]
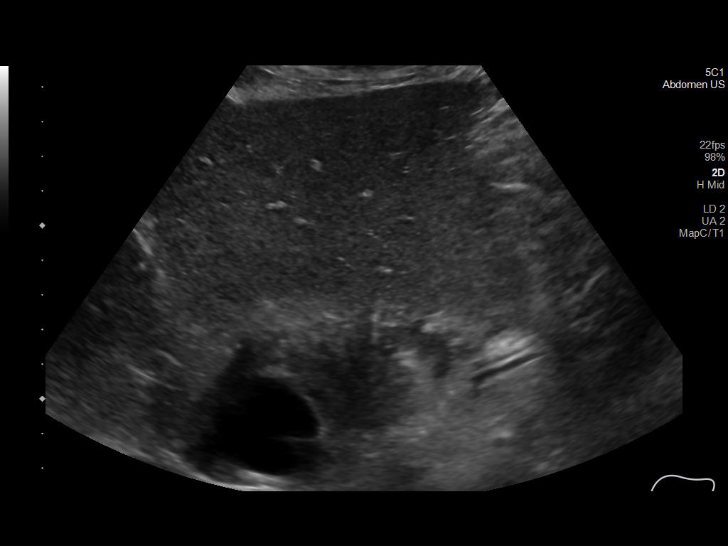
[im 10/40]
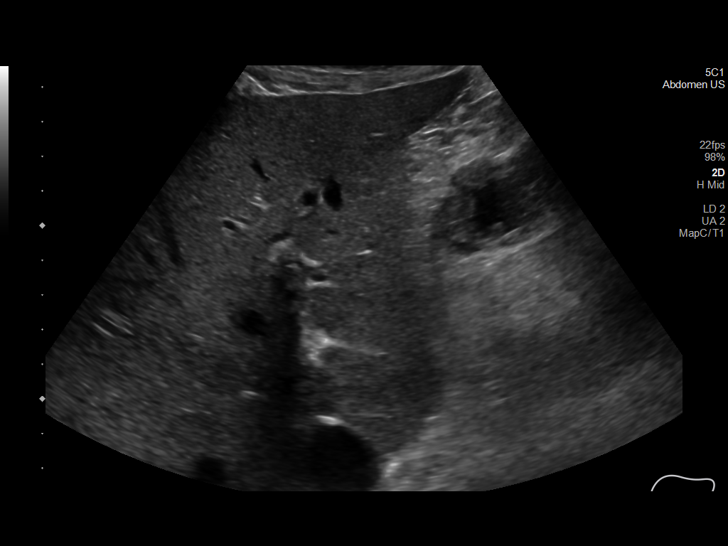
[im 14/40]
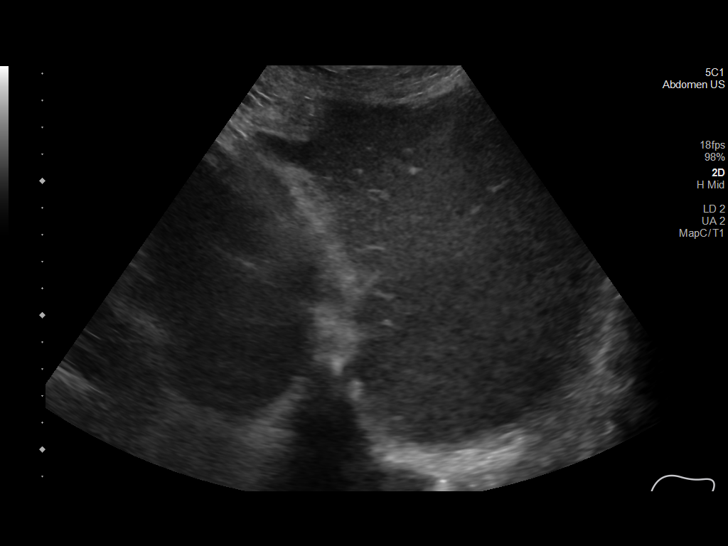
[im 15/40]
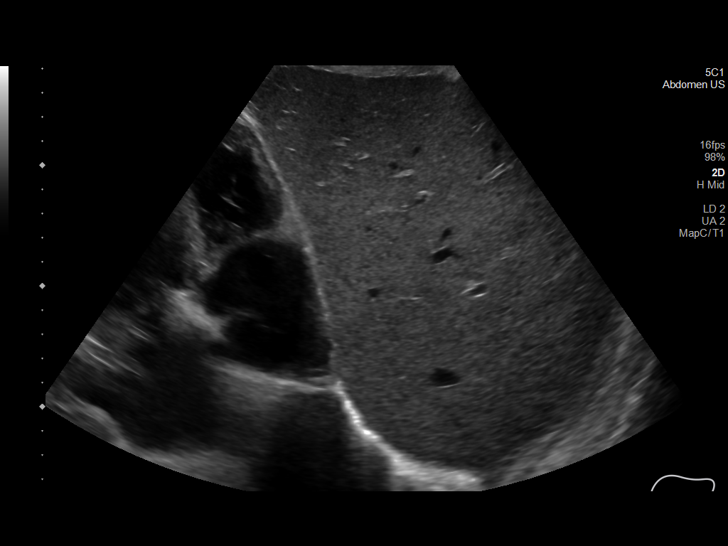
[im 18/40]
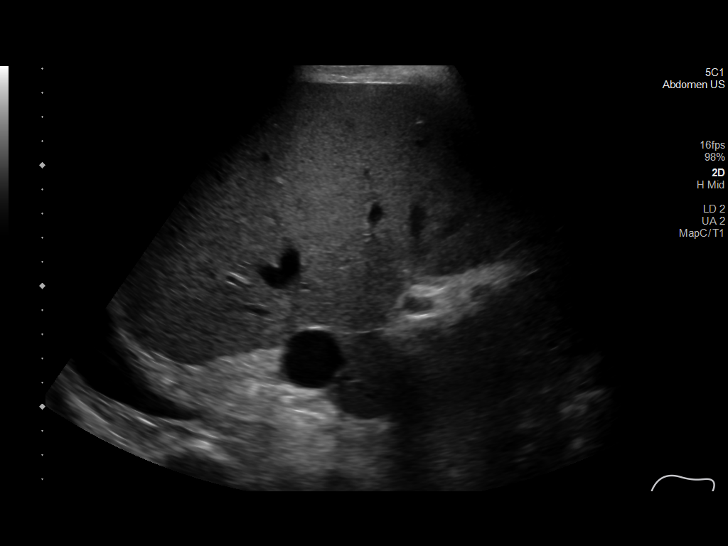
[im 22/40]
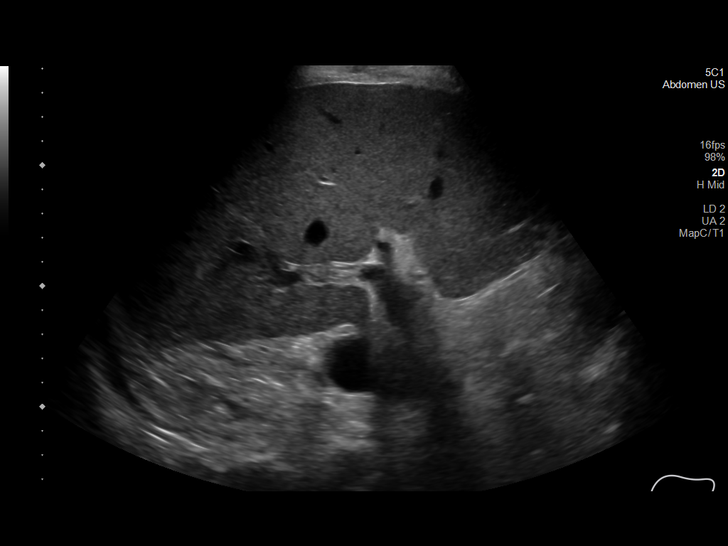
[im 25/40]
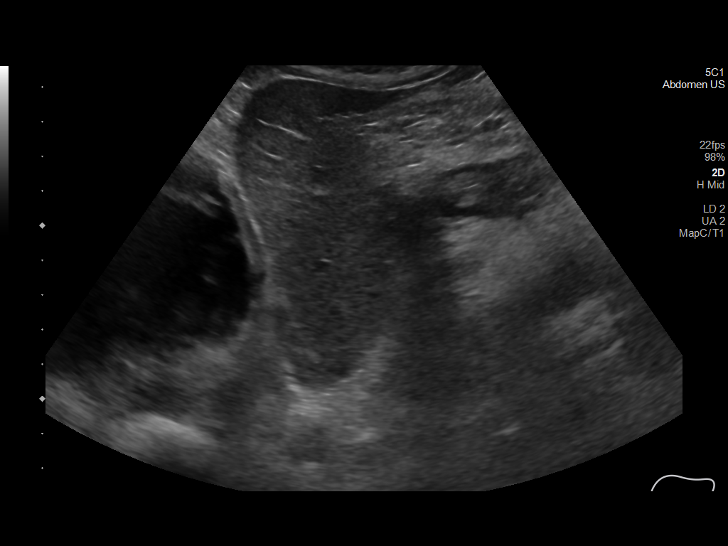
[im 27/40]
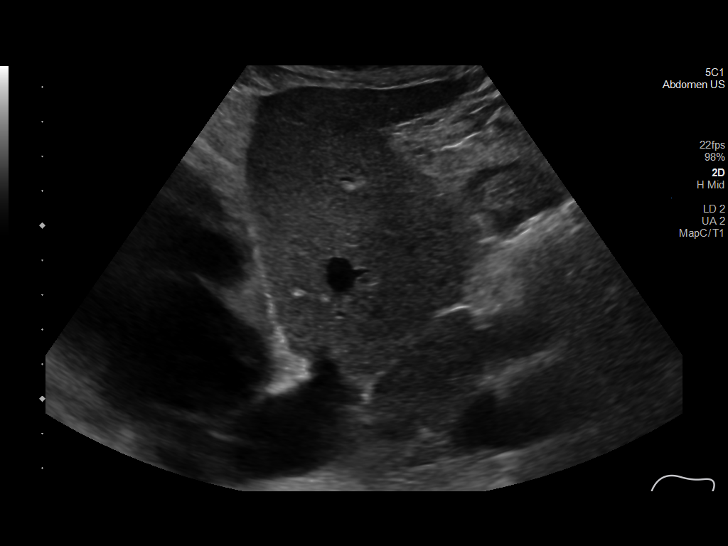
[im 30/40]
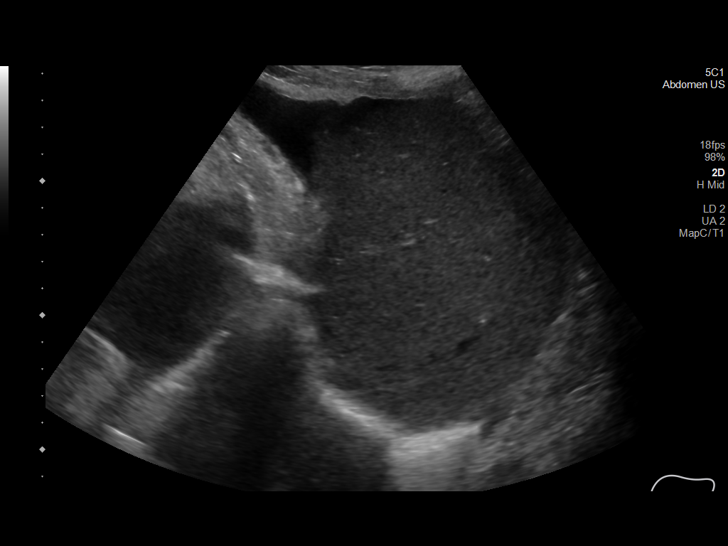
[im 33/40]
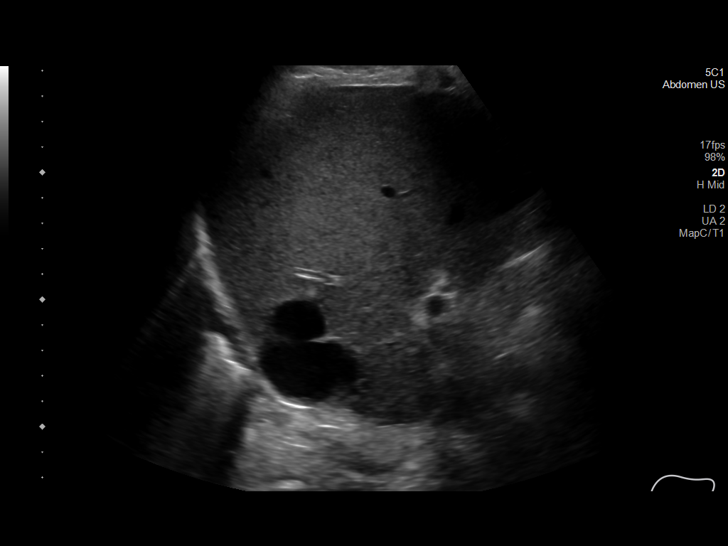
[im 36/40]
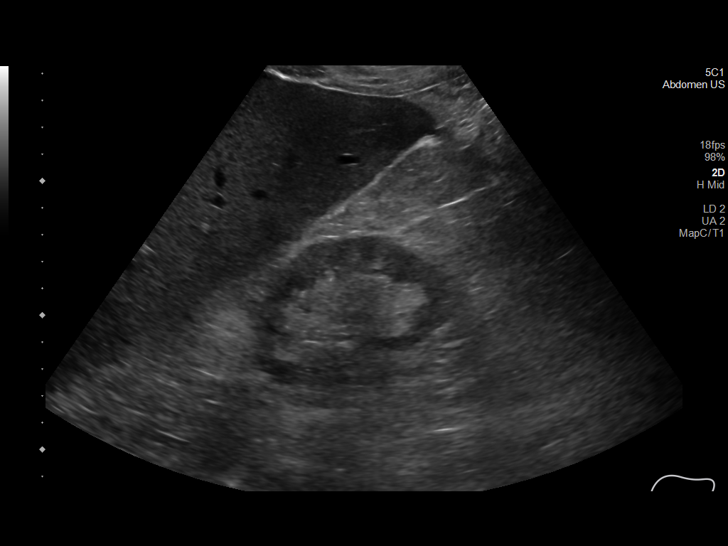
[im 40/40]
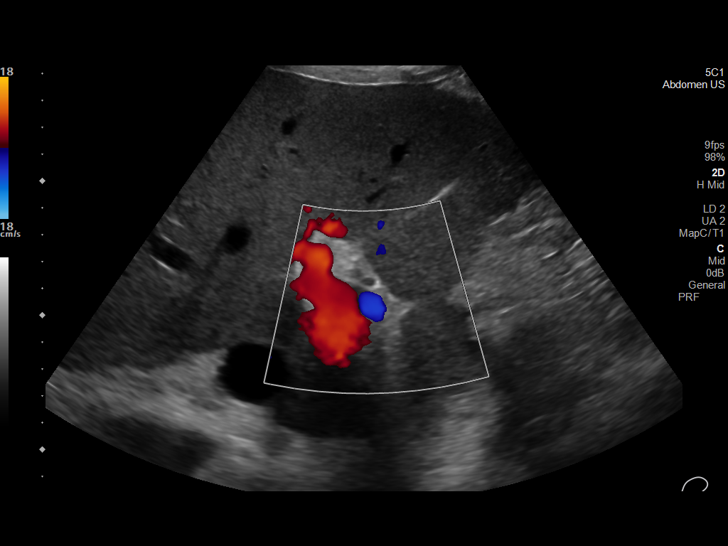

[14 of 25 positions shown; findings below may reference images not displayed]

FINDINGS: Gallbladder:

Surgically absent.

Common bile duct:

Diameter: 4 mm

Liver:

No focal lesion identified. Within normal limits in parenchymal
echogenicity. Portal vein is patent on color Doppler imaging with
normal direction of blood flow towards the liver.

Other: Ascites is noted near liver. A pleural effusion is partially
imaged.
IMPRESSION: No focal hepatic lesion. Ascites and a pleural effusion are
partially imaged.

## 2022-11-20 ENCOUNTER — Other Ambulatory Visit (HOSPITAL_COMMUNITY): Payer: Self-pay

## 2022-11-20 NOTE — Progress Notes (Signed)
Paramedicine Encounter    Patient ID: Tommy Ward, male    DOB: 17-Mar-1941, 82 y.o.   MRN: [id number]   Complaints- NONE   Assessment- CAOX4, warm and dry, no complaints, no shortness of breath, no dizziness, no chest pain, no swelling, no weight gain, lungs clear.   Compliance with meds- no missed doses over the last two weeks   Pill box filled- for one week   Refills needed- xtandi (called in will be delivered Friday)  Meds changes since last visit- He elected to switch his cetrizine for benadryl 25mg  at night.      Social changes- none    BP 110/64   Pulse 70   Resp 16   Wt 163 Tommy 9.6 oz (74.2 kg)   SpO2 95%   BMI 24.16 kg/m  Weight yesterday-- 162lbs Last visit weight-- 164lbs  Arrived for home visit for [facility] who reports to be feeling well with no complaints. He was ambulatory around his home with no shortness of breath, no edema noted, lungs clear. I obtained vitals which were within normal limits. We reviewed meds and confirmed same. I filled pill box for one week. I ordered Xtandi refill- it will be delivered on Friday. We discussed HF education, upcoming appointments. He had no current needs or concerns. Home visit planned for one week. Visit complete.   [name], [place]  ACTION: Home visit completed    Patient Care Team: Serita Grammes, MD as PCP - General (Family Medicine) Debara Pickett Nadean Corwin, MD as PCP - Cardiology (Cardiology) Vickie Epley, MD as PCP - Electrophysiology (Cardiology) Marice Potter, MD as Consulting Physician (Oncology) Azzie Glatter, DDS as Referring Physician (Oral Surgery) Joie Bimler, MD as Consulting Physician (Urology)  Patient Active Problem List   Diagnosis Date Noted   Anemia due to stage 4 chronic kidney disease 03/20/2022   Hypercalcemia of malignancy [id number]   Chronic systolic heart failure    Abnormal stress test 12/05/2020   Depressed left ventricular ejection fraction  12/05/2020   Nonrheumatic mitral valve regurgitation 12/05/2020   Pre-diabetes 12/05/2020   Pulmonary hypertension 12/05/2020   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   Severe pulmonary hypertension 12/05/2020   Tobacco use 12/05/2020   Dilated cardiomyopathy 12/05/2020   HFrEF (heart failure with reduced ejection fraction) 11/23/2020   CAD (coronary artery disease) 11/23/2020   GERD (gastroesophageal reflux disease)    Hyperlipidemia    Hypertension    Secondary malignant neoplasm of bone and bone marrow 06/02/2020   Malignant neoplasm of prostate 06/02/2020   AKI (acute kidney injury) 11/25/2019   Hyperphosphatemia 11/25/2019   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 chronic kidney disease 08/24/2019   Hyperkalemia [id number]   Metabolic bone disease [id number]   Vitamin D deficiency 08/24/2019   Benign hypertension with chronic kidney disease, stage III 05/20/2019   Decreased cardiac ejection fraction 05/20/2019   Benign hypertension with CKD (chronic kidney disease) stage IV 05/20/2019   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 02/24/2019   Olecranon bursitis of left elbow 06/25/2018   B12 deficiency 03/22/2018   Gastroesophageal reflux disease without esophagitis 03/22/2018   Chronic renal insufficiency, stage III (moderate) 11/27/2015   Aortic valve disorder 07/08/2014   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   Cataract 11/2013   Essential hypertension 07/06/2012   Coronary arteriosclerosis 07/06/2012   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   Left bundle branch block 07/06/2012   Mixed hyperlipidemia 07/06/2012   Cancer 08/2008  S/P radiation therapy > 12 wks ago 2010    Current Outpatient Medications:    aspirin EC 81 MG tablet, Take 1 tablet (81 mg total) by mouth daily., Disp: 90 tablet, Rfl: 3   dapagliflozin propanediol (FARXIGA) 10 MG TABS tablet, Take 1 tablet (10 mg total) by mouth daily  before breakfast. Pharmacy note: please split bill Farxiga to [place]: [id number] RX PCN: [id number] RX GROUP: [id number] RX ID: [id number], Disp: 90 tablet, Rfl: 3   digoxin (LANOXIN) 0.125 MG tablet, Take 1 tablet (0.125 mg total) by mouth every other day., Disp: 45 tablet, Rfl: 3   diphenhydrAMINE (BENADRYL) 25 MG tablet, Take 25 mg by mouth at bedtime., Disp: , Rfl:    enzalutamide (XTANDI) 40 MG tablet, Take 160 mg by mouth daily., Disp: , Rfl:    Evolocumab (REPATHA SURECLICK) [id number] MG/ML SOAJ, INJECT 1 DOSE UNDER THE SKIN EVERY 14 DAYS, Disp: 6 mL, Rfl: 3   metoprolol succinate (TOPROL XL) 25 MG 24 hr tablet, Take 0.5 tablets (12.5 mg total) by mouth at bedtime., Disp: 45 tablet, Rfl: 3   Multiple Vitamins-Minerals (PRESERVISION/LUTEIN) CAPS, Take 1 capsule by mouth at bedtime., Disp: , Rfl:    omeprazole (PRILOSEC) 40 MG capsule, Take 40 mg by mouth daily., Disp: , Rfl:    pregabalin (LYRICA) 100 MG capsule, Take 100 mg by mouth 3 (three) times daily., Disp: , Rfl:    rosuvastatin (CRESTOR) 5 MG tablet, Take 5 mg by mouth daily., Disp: , Rfl:    spironolactone (ALDACTONE) 25 MG tablet, Take 1 tablet (25 mg total) by mouth daily., Disp: 90 tablet, Rfl: 1   tamsulosin (FLOMAX) 0.4 MG CAPS capsule, Take 1 capsule (0.4 mg total) by mouth daily., Disp: 30 capsule, Rfl: 0   torsemide (DEMADEX) 20 MG tablet, Alternate 60 mg with 40 mg, Disp: 180 tablet, Rfl: 3   traZODone (DESYREL) 100 MG tablet, Take 50 mg by mouth at bedtime., Disp: , Rfl:    Cetirizine HCl 10 MG CAPS, Take 1 capsule by mouth daily. (Patient not taking: Reported on 11/20/2022), Disp: , Rfl:    cholestyramine light (PREVALITE) 4 g packet, Take 4 g by mouth daily., Disp: , Rfl:    pentoxifylline (TRENTAL) 400 MG CR tablet, Take 400 mg by mouth 3 (three) times daily with meals. (Patient not taking: Reported on 11/06/2022), Disp: , Rfl:  Allergies  Allergen Reactions   Ezetimibe Other (See Comments)    Myalgia   Gabapentin Itching and  Other (See Comments)    Sore throat, breakouts   Statins Other (See Comments)    Myalgias (intolerance)     Social History   Socioeconomic History   Marital status: Married    Spouse name: Not on file   Number of children: Not on file   Years of education: Not on file   Highest education level: Not on file  Occupational History   Not on file  Tobacco Use   Smoking status: Every Day    Packs/day: 0.50    Years: 50.00    Additional pack years: 0.00    Total pack years: 25.00    Types: Cigarettes   Smokeless tobacco: Never  Vaping Use   Vaping Use: Never used  Substance and Sexual Activity   Alcohol use: Not Currently   Drug use: Never   Sexual activity: Not on file  Other Topics Concern   Not on file  Social History Narrative   Not on file   Social Determinants  of Health   Financial Resource Strain: Low Risk  (05/22/2022)   Overall Financial Resource Strain (CARDIA)    Difficulty of Paying Living Expenses: Not very hard  Food Insecurity: No Food Insecurity (05/22/2022)   Hunger Vital Sign    Worried About Running Out of Food in the Last Year: Never true    Ran Out of Food in the Last Year: Never true  Transportation Needs: No Transportation Needs (05/22/2022)   PRAPARE - Hydrologist (Medical): No    Lack of Transportation (Non-Medical): No  Physical Activity: Not on file  Stress: Not on file  Social Connections: Not on file  Intimate Partner Violence: Not on file    Physical Exam      Future Appointments  Date Time Provider [place]  11/21/2022  9:15 AM Marzetta Board, DPM [facility]  12/19/2022  8:40 AM Larey Dresser, MD [facility] None  12/23/2022  3:40 PM [facility] DEVICE REMOTES [facility]  02/11/2023  9:30 AM [facility] None  02/12/2023  9:30 AM Marice Potter, MD [facility] None  03/24/2023  3:40 PM [facility] DEVICE REMOTES [facility]  06/23/2023  3:40 PM  [facility] DEVICE REMOTES [facility]  09/22/2023  3:40 PM [facility] DEVICE REMOTES [facility]

## 2022-11-21 ENCOUNTER — Ambulatory Visit: Payer: Medicare Other | Admitting: Podiatry

## 2022-11-21 ENCOUNTER — Encounter: Payer: Self-pay | Admitting: Oncology

## 2022-11-21 ENCOUNTER — Ambulatory Visit (INDEPENDENT_AMBULATORY_CARE_PROVIDER_SITE_OTHER): Payer: Medicare Other | Admitting: Podiatry

## 2022-11-21 VITALS — BP 96/65 | HR 69

## 2022-11-21 DIAGNOSIS — G629 Polyneuropathy, unspecified: Secondary | ICD-10-CM

## 2022-11-21 DIAGNOSIS — S99929A Unspecified injury of unspecified foot, initial encounter: Secondary | ICD-10-CM

## 2022-11-21 DIAGNOSIS — B351 Tinea unguium: Secondary | ICD-10-CM

## 2022-11-21 DIAGNOSIS — M79675 Pain in left toe(s): Secondary | ICD-10-CM

## 2022-11-21 DIAGNOSIS — M79674 Pain in right toe(s): Secondary | ICD-10-CM | POA: Diagnosis not present

## 2022-11-21 NOTE — Progress Notes (Signed)
  Subjective:  Patient ID: Tommy Ward, male    DOB: 1940-12-04,  MRN: [id number]  Tommy Ward presents to clinic today for at risk foot care with history of peripheral neuropathy and painful thick toenails that are difficult to trim. Pain interferes with ambulation. Aggravating factors include wearing enclosed shoe gear. Pain is relieved with periodic professional debridement.  Chief Complaint  Patient presents with   routine foot care    New problem(s): Patient states he dropped a wrench on his right great toe. He has injury to nailplate at junction of proximal nailfold. He is able to move his toe and denies any pain.  PCP is Tommy Malta, MD.  Allergies  Allergen Reactions   Ezetimibe Other (See Comments)    Myalgia   Gabapentin Itching and Other (See Comments)    Sore throat, breakouts   Statins Other (See Comments)    Myalgias (intolerance)    Review of Systems: Negative except as noted in the HPI.  Objective:  Vitals:   11/21/22 0917  BP: 96/65  Pulse: 69   Tommy Ward is a pleasant 82 y.o. male WD, WN in NAD. AAO x 3.  Vascular Examination: CFT <3 seconds b/l. DP/PT pulses faintly palpable b/l. Skin temperature gradient warm to warm b/l. No pain with calf compression. No ischemia or gangrene. No cyanosis or clubbing noted b/l. Pedal hair sparse. Varicosities present b/l.   Neurological Examination: Sensation grossly intact b/l with 10 gram monofilament. Vibratory sensation intact b/l. Pt has subjective symptoms of neuropathy.  Dermatological Examination: Pedal skin warm and supple b/l. Toenails 2-5 b/l thick, discolored, elongated with subungual debris and pain on dorsal palpation.    Incurvated nailplate left great toe.  Nail border hypertrophy minimal. There is tenderness to palpation. Sign(s) of infection: no clinical signs of infection noted on examination today..  Evidence of subungual injury at proximal nailfold right great toe devoid of  nail centrally and there is evidence with resolving hematoma. Nailplate remains stable. No purulence, no nail border hypertrophy.  Musculoskeletal Examination: Muscle strength 5/5 to b/l LE. Limited joint ROM to the 1st MPJ b/l right >left.  Radiographs: None  Assessment/Plan: 1. Pain due to onychomycosis of toenails of both feet   2. Injury of nail bed of toe   3. Neuropathy    -Patient was evaluated and treated. All patient's and/or POA's questions/concerns answered on today's visit. -Examined patient. -Cleansed exposed nailbed of right great toe with alchol. Applied triple antibiotic ointment. Patient instructed to apply triple antibiotic ointment to digit once daily for one week. -Continue supportive shoe gear daily. -Mycotic toenails 1-5 bilaterally were debrided in length and girth with sterile nail nippers and dremel without incident. -No invasive procedure(s) performed. Offending nail border debrided and curretaged left great toe utilizing sterile nail nipper and currette. Border(s) cleansed with alcohol and triple antibiotic ointment applied. Patient/POA/Caregiver/Facility instructed to apply triple antibiotic ointment  to left great toe once daily for 7 days. Call office if there are any concerns. -Patient/POA to call should there be question/concern in the interim.   Return in about 3 months (around 02/20/2023).  Freddie Breech, DPM

## 2022-11-21 NOTE — Patient Instructions (Signed)
Apply triple antibiotic ointment to both great toes once daily for one week. Call office if you have any problems. ?

## 2022-11-24 ENCOUNTER — Encounter: Payer: Self-pay | Admitting: Podiatry

## 2022-11-27 ENCOUNTER — Other Ambulatory Visit (HOSPITAL_COMMUNITY): Payer: Self-pay

## 2022-11-27 NOTE — Progress Notes (Signed)
Paramedicine Encounter    Patient ID: Tommy Ward, male    DOB: 1941-07-17, 82 y.o.   MRN: [id number]   Complaints- chronic neuropathy pain in feet.   Assessment- CAOX4, warm and dry ambulating with no shortness of breath, denied chest pain, shortness of breath and dizziness. No lower leg swelling, lungs clear. No weight gain.   Compliance with meds- no missed doses over the last week.   Pill box filled- for one week   Refills needed- Repatha    Meds changes since last visit- NONE     Social changes- NONE    BP 98/62   Pulse 70   Resp 16   Wt 164 lb (74.4 kg)   SpO2 97%   BMI 24.22 kg/m  Weight yesterday-165bs Last visit weight-163lbs  Arrived for home visit for [facility] who reports to be feeling well with only reports of chronic feet pain from neuropathy. He denied any chest pain, shortness of breath, dizziness or swelling. Lungs clear. No lower leg swelling. Vitals obtained as noted. Meds reviewed and confirmed. Refills as noted. We reviewed HF education. Meds and diet. He agreed. We reviewed upcoming appointments and confirmed same. Home visit complete. I will see Hannah in one week.    [name], EMT-Paramedic [phone]  ACTION: Home visit completed    Patient Care Team: Buckner Malta, MD as PCP - General (Family Medicine) Rennis Golden Lisette Abu, MD as PCP - Cardiology (Cardiology) Lanier Prude, MD as PCP - Electrophysiology (Cardiology) Weston Settle, MD as Consulting Physician (Oncology) Dora Sims, DDS as Referring Physician (Oral Surgery) Debroah Baller, MD as Consulting Physician (Urology)  Patient Active Problem List   Diagnosis Date Noted   Vitamin D toxicity 07/24/2022   Anemia due to stage 4 chronic kidney disease 03/20/2022   Hypercalcemia of malignancy 02/16/2021   Chronic systolic heart failure    Abnormal stress test 12/05/2020   Depressed left ventricular ejection fraction 12/05/2020   Nonrheumatic mitral valve  regurgitation 12/05/2020   Pre-diabetes 12/05/2020   Pulmonary hypertension 12/05/2020   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   Severe pulmonary hypertension 12/05/2020   Tobacco use 12/05/2020   Dilated cardiomyopathy 12/05/2020   HFrEF (heart failure with reduced ejection fraction) 11/23/2020   CAD (coronary artery disease) 11/23/2020   GERD (gastroesophageal reflux disease)    Hyperlipidemia    Hypertension    Secondary malignant neoplasm of bone and bone marrow 06/02/2020   Malignant neoplasm of prostate 06/02/2020   AKI (acute kidney injury) 11/25/2019   Hyperphosphatemia 11/25/2019   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 chronic kidney disease 08/24/2019   Hyperkalemia 08/24/2019   Metabolic bone disease 08/24/2019   Vitamin D deficiency 08/24/2019   Benign hypertension with chronic kidney disease, stage III 05/20/2019   Decreased cardiac ejection fraction 05/20/2019   Benign hypertension with CKD (chronic kidney disease) stage IV 05/20/2019   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 02/24/2019   Olecranon bursitis of left elbow 06/25/2018   B12 deficiency 03/22/2018   Gastroesophageal reflux disease without esophagitis 03/22/2018   Chronic renal insufficiency, stage III (moderate) 11/27/2015   Aortic valve disorder 07/08/2014   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   Cataract 11/2013   Essential hypertension 07/06/2012   Coronary arteriosclerosis 07/06/2012   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   Left bundle branch block 07/06/2012   Mixed hyperlipidemia 07/06/2012   Cancer 08/2008   S/P radiation therapy > 12 wks ago 2010  Current Outpatient Medications:    aspirin EC 81 MG tablet, Take 1 tablet (81 mg total) by mouth daily., Disp: 90 tablet, Rfl: 3   Cetirizine HCl 10 MG CAPS, Take 1 capsule by mouth daily., Disp: , Rfl:    cholestyramine light (PREVALITE) 4 g packet, Take 4 g by mouth  daily., Disp: , Rfl:    dapagliflozin propanediol (FARXIGA) 10 MG TABS tablet, Take 1 tablet (10 mg total) by mouth daily before breakfast. Pharmacy note: please split bill Farxiga to [facility] RX BIN: [id number] RX PCN: [id number] RX GROUP: [id number] RX ID: [id number], Disp: 90 tablet, Rfl: 3   digoxin (LANOXIN) 0.125 MG tablet, Take 1 tablet (0.125 mg total) by mouth every other day., Disp: 45 tablet, Rfl: 3   enzalutamide (XTANDI) 40 MG tablet, Take 160 mg by mouth daily., Disp: , Rfl:    Evolocumab (REPATHA SURECLICK) 140 MG/ML SOAJ, INJECT 1 DOSE UNDER THE SKIN EVERY 14 DAYS, Disp: 6 mL, Rfl: 3   metoprolol succinate (TOPROL XL) 25 MG 24 hr tablet, Take 0.5 tablets (12.5 mg total) by mouth at bedtime., Disp: 45 tablet, Rfl: 3   Multiple Vitamins-Minerals (PRESERVISION/LUTEIN) CAPS, Take 1 capsule by mouth at bedtime., Disp: , Rfl:    omeprazole (PRILOSEC) 40 MG capsule, Take 40 mg by mouth daily., Disp: , Rfl:    pregabalin (LYRICA) 100 MG capsule, Take 100 mg by mouth 3 (three) times daily., Disp: , Rfl:    rosuvastatin (CRESTOR) 5 MG tablet, Take 5 mg by mouth daily., Disp: , Rfl:    spironolactone (ALDACTONE) 25 MG tablet, Take 1 tablet (25 mg total) by mouth daily., Disp: 90 tablet, Rfl: 1   tamsulosin (FLOMAX) 0.4 MG CAPS capsule, Take 1 capsule (0.4 mg total) by mouth daily., Disp: 30 capsule, Rfl: 0   torsemide (DEMADEX) 20 MG tablet, Alternate 60 mg with 40 mg, Disp: 180 tablet, Rfl: 3   traZODone (DESYREL) 100 MG tablet, Take 50 mg by mouth at bedtime., Disp: , Rfl:    diphenhydrAMINE (BENADRYL) 25 MG tablet, Take 25 mg by mouth at bedtime. (Patient not taking: Reported on 11/27/2022), Disp: , Rfl:    pentoxifylline (TRENTAL) 400 MG CR tablet, Take 400 mg by mouth 3 (three) times daily with meals. (Patient not taking: Reported on 11/06/2022), Disp: , Rfl:  Allergies  Allergen Reactions   Ezetimibe Other (See Comments)    Myalgia   Gabapentin Itching and Other (See Comments)    Sore throat,  breakouts   Statins Other (See Comments)    Myalgias (intolerance)     Social History   Socioeconomic History   Marital status: Married    Spouse name: Not on file   Number of children: Not on file   Years of education: Not on file   Highest education level: Not on file  Occupational History   Not on file  Tobacco Use   Smoking status: Every Day    Packs/day: 0.50    Years: 50.00    Additional pack years: 0.00    Total pack years: 25.00    Types: Cigarettes   Smokeless tobacco: Never  Vaping Use   Vaping Use: Never used  Substance and Sexual Activity   Alcohol use: Not Currently   Drug use: Never   Sexual activity: Not on file  Other Topics Concern   Not on file  Social History Narrative   Not on file   Social Determinants of Health   Financial Resource Strain: Low Risk  (05/22/2022)  Overall Financial Resource Strain (CARDIA)    Difficulty of Paying Living Expenses: Not very hard  Food Insecurity: No Food Insecurity (05/22/2022)   Hunger Vital Sign    Worried About Running Out of Food in the Last Year: Never true    Ran Out of Food in the Last Year: Never true  Transportation Needs: No Transportation Needs (05/22/2022)   PRAPARE - Administrator, Civil ServiceTransportation    Lack of Transportation (Medical): No    Lack of Transportation (Non-Medical): No  Physical Activity: Not on file  Stress: Not on file  Social Connections: Not on file  Intimate Partner Violence: Not on file    Physical Exam      Future Appointments  Date Time Provider Department Center  12/19/2022  8:40 AM Laurey MoraleMcLean, Dalton S, MD [facility] None  12/23/2022  3:40 PM [facility] DEVICE REMOTES [facility]  02/11/2023  9:30 AM [facility] None  02/12/2023  9:30 AM Weston SettleLewis, Dequincy A, MD [facility] None  02/26/2023  9:15 AM McCaughan, Dia D, DPM [facility]  03/24/2023  3:40 PM [facility] DEVICE REMOTES [facility]  06/23/2023  3:40 PM [facility] DEVICE REMOTES [facility]  09/22/2023  3:40 PM [facility] DEVICE REMOTES [facility]

## 2022-12-04 ENCOUNTER — Other Ambulatory Visit (HOSPITAL_COMMUNITY): Payer: Self-pay

## 2022-12-04 NOTE — Progress Notes (Signed)
Paramedicine Encounter    Patient ID: Tommy Ward, male    DOB: [birth year], 82 y.o.   MRN: [id number]   Complaints- none   Assessment- CAOX4, warm and dry, ambulating without shortness of breath, no chest pain, no dizziness, no swelling or weight gain. Lungs clear, no lower leg edema.   Compliance with meds- no missed doses of medications in the last week.   Pill box filled- for one week.   Refills needed- crestor   Meds changes since last visit- none     Social changes- none   Arrived for home visit for [place] who reports to be feeling well with no complaints today. He says he has had no shortness of breath, no dizziness, no swelling or weight gain. 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 He has been complaint with his meds over the last week. I obtained vitals as noted. Assessment as noted. I reviewed meds and filled pill box for one week. Refills as noted. We reviewed upcoming appointments. Tommy Ward seems to be doing well. We will see each other in one week. He agreed with plan.    BP 110/80   Pulse 70   Resp 16   Wt 164 lb 3.2 oz (74.5 kg)   SpO2 99%   BMI 24.25 kg/m  Weight yesterday-didn't weigh  Last visit weight--164.2lbs    [name], EMT-Paramedic [phone]  ACTION: Home visit completed    Patient Care Team: Buckner Malta, MD as PCP - General (Family Medicine) Rennis Golden, Lisette Abu, MD as PCP - Cardiology (Cardiology) Lanier Prude, MD as PCP - Electrophysiology (Cardiology) Weston Settle, MD as Consulting Physician (Oncology) Dora Sims, DDS as Referring Physician (Oral Surgery) Debroah Baller, MD as Consulting Physician (Urology)  Patient Active Problem List   Diagnosis Date Noted   Vitamin D toxicity 07/24/2022   Anemia due to stage 4 chronic kidney disease 03/20/2022   Hypercalcemia of malignancy 02/16/2021   Chronic systolic heart  failure    Abnormal stress test 12/05/2020   Depressed left ventricular ejection fraction 12/05/2020   Nonrheumatic mitral valve regurgitation 12/05/2020   Pre-diabetes 12/05/2020   Pulmonary hypertension 12/05/2020   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   Severe pulmonary hypertension 12/05/2020   Tobacco use 12/05/2020   Dilated cardiomyopathy 12/05/2020   HFrEF (heart failure with reduced ejection fraction) 11/23/2020   CAD (coronary artery disease) 11/23/2020   GERD (gastroesophageal reflux disease)    Hyperlipidemia    Hypertension    Secondary malignant neoplasm of bone and bone marrow 06/02/2020   Malignant neoplasm of prostate 06/02/2020   AKI (acute kidney injury) 11/25/2019   Hyperphosphatemia 11/25/2019   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 chronic kidney disease 08/24/2019   Hyperkalemia 08/24/2019   Metabolic bone disease 08/24/2019   Vitamin D deficiency 08/24/2019   Benign hypertension with chronic kidney disease, stage III 05/20/2019   Decreased cardiac ejection fraction 05/20/2019   Benign hypertension with CKD (chronic kidney disease) stage IV 05/20/2019   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 02/24/2019   Olecranon bursitis of left elbow 06/25/2018   B12 deficiency 03/22/2018   Gastroesophageal reflux disease without esophagitis 03/22/2018   Chronic renal insufficiency, stage III (moderate) 11/27/2015   Aortic valve disorder 07/08/2014   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   Cataract 11/2013   Essential hypertension 07/06/2012   Coronary arteriosclerosis 07/06/2012   Hypertensive heart  disease without congestive heart failure 07/06/2012   Left bundle branch block 07/06/2012   Mixed hyperlipidemia 07/06/2012   Cancer 08/2008   S/P radiation therapy > 12 wks ago 2010    Current Outpatient Medications:    aspirin EC 81 MG tablet, Take 1 tablet (81 mg total) by mouth daily., Disp: 90 tablet, Rfl: 3   Cetirizine  HCl 10 MG CAPS, Take 1 capsule by mouth daily., Disp: , Rfl:    cholestyramine light (PREVALITE) 4 g packet, Take 4 g by mouth daily., Disp: , Rfl:    dapagliflozin propanediol (FARXIGA) 10 MG TABS tablet, Take 1 tablet (10 mg total) by mouth daily before breakfast. Pharmacy note: please split bill Farxiga to [facility] RX BIN: [id number] RX PCN: [id number] RX GROUP: [id number] RX ID: [id number], Disp: 90 tablet, Rfl: 3   digoxin (LANOXIN) 0.125 MG tablet, Take 1 tablet (0.125 mg total) by mouth every other day., Disp: 45 tablet, Rfl: 3   enzalutamide (XTANDI) 40 MG tablet, Take 160 mg by mouth daily., Disp: , Rfl:    Evolocumab (REPATHA SURECLICK) 140 MG/ML SOAJ, INJECT 1 DOSE UNDER THE SKIN EVERY 14 DAYS, Disp: 6 mL, Rfl: 3   metoprolol succinate (TOPROL XL) 25 MG 24 hr tablet, Take 0.5 tablets (12.5 mg total) by mouth at bedtime., Disp: 45 tablet, Rfl: 3   Multiple Vitamins-Minerals (PRESERVISION/LUTEIN) CAPS, Take 1 capsule by mouth at bedtime., Disp: , Rfl:    omeprazole (PRILOSEC) 40 MG capsule, Take 40 mg by mouth daily., Disp: , Rfl:    pregabalin (LYRICA) 100 MG capsule, Take 100 mg by mouth 3 (three) times daily., Disp: , Rfl:    rosuvastatin (CRESTOR) 5 MG tablet, Take 5 mg by mouth daily., Disp: , Rfl:    spironolactone (ALDACTONE) 25 MG tablet, Take 1 tablet (25 mg total) by mouth daily., Disp: 90 tablet, Rfl: 1   tamsulosin (FLOMAX) 0.4 MG CAPS capsule, Take 1 capsule (0.4 mg total) by mouth daily., Disp: 30 capsule, Rfl: 0   torsemide (DEMADEX) 20 MG tablet, Alternate 60 mg with 40 mg, Disp: 180 tablet, Rfl: 3   traZODone (DESYREL) 100 MG tablet, Take 50 mg by mouth at bedtime., Disp: , Rfl:    diphenhydrAMINE (BENADRYL) 25 MG tablet, Take 25 mg by mouth at bedtime. (Patient not taking: Reported on 11/27/2022), Disp: , Rfl:    pentoxifylline (TRENTAL) 400 MG CR tablet, Take 400 mg by mouth 3 (three) times daily with meals. (Patient not taking: Reported on 11/06/2022), Disp: , Rfl:  Allergies   Allergen Reactions   Ezetimibe Other (See Comments)    Myalgia   Gabapentin Itching and Other (See Comments)    Sore throat, breakouts   Statins Other (See Comments)    Myalgias (intolerance)     Social History   Socioeconomic History   Marital status: Married    Spouse name: Not on file   Number of children: Not on file   Years of education: Not on file   Highest education level: Not on file  Occupational History   Not on file  Tobacco Use   Smoking status: Every Day    Packs/day: 0.50    Years: 50.00    Additional pack years: 0.00    Total pack years: 25.00    Types: Cigarettes   Smokeless tobacco: Never  Vaping Use   Vaping Use: Never used  Substance and Sexual Activity   Alcohol use: Not Currently   Drug use: Never   Sexual activity: Not  on file  Other Topics Concern   Not on file  Social History Narrative   Not on file   Social Determinants of Health   Financial Resource Strain: Low Risk  (05/22/2022)   Overall Financial Resource Strain (CARDIA)    Difficulty of Paying Living Expenses: Not very hard  Food Insecurity: No Food Insecurity (05/22/2022)   Hunger Vital Sign    Worried About Running Out of Food in the Last Year: Never true    Ran Out of Food in the Last Year: Never true  Transportation Needs: No Transportation Needs (05/22/2022)   PRAPARE - Administrator, Civil Service (Medical): No    Lack of Transportation (Non-Medical): No  Physical Activity: Not on file  Stress: Not on file  Social Connections: Not on file  Intimate Partner Violence: Not on file    Physical Exam      Future Appointments  Date Time Provider Department Center  12/19/2022  8:40 AM Laurey Morale, MD [facility] None  12/23/2022  3:40 PM [facility] DEVICE REMOTES [facility]  02/11/2023  9:30 AM [facility] None  02/12/2023  9:30 AM Weston Settle, MD [facility] None  02/26/2023  9:15 AM McCaughan, Dia D, DPM [facility]   03/24/2023  3:40 PM [facility] DEVICE REMOTES [facility]  06/23/2023  3:40 PM [facility] DEVICE REMOTES [facility]  09/22/2023  3:40 PM [facility] DEVICE REMOTES [facility]

## 2022-12-10 ENCOUNTER — Telehealth: Payer: Self-pay

## 2022-12-10 NOTE — Patient Outreach (Signed)
  Care Coordination   12/10/2022 Name: Tommy Ward MRN: [id number] DOB: 14-May-1941   Care Coordination Outreach Attempts:  An unsuccessful telephone outreach was attempted today to offer the patient information about available care coordination services as a benefit of their health plan.   Follow Up Plan:  Additional outreach attempts will be made to offer the patient care coordination information and services.   Encounter Outcome:  No Answer   Care Coordination Interventions:  No, not indicated    Rowe Pavy, RN, BSN, [facility] [employer] [phone]

## 2022-12-11 ENCOUNTER — Telehealth: Payer: Self-pay

## 2022-12-11 ENCOUNTER — Other Ambulatory Visit (HOSPITAL_COMMUNITY): Payer: Self-pay

## 2022-12-11 NOTE — Progress Notes (Signed)
Paramedicine Encounter    Patient ID: Tommy Ward, male    DOB: 07-May-1941, 82 y.o.   MRN: [id number]   Complaints- neuropathy in feet- pain related to same.   Assessment- CAO4, warm and dry, no lower leg swelling, lungs clear, no weight gain, no chest pain, no dizziness.   Compliance with meds- missed one morning dose on Friday.   Pill box filled- for one week.   Refills needed- none   Meds changes since last visit- none     Social changes- none    BP 110/62   Pulse 70   Resp 16   Wt 163 lb (73.9 kg)   SpO2 98%   BMI 24.07 kg/m  Weight yesterday-didn't weigh  Last visit weight-- 164lbs   Arrived for home visit for [facility] who reports feeling well other than some pain in his feet from neuropathy. He denied any other pain, no dizziness, no lower leg swelling, lungs clear. No weight gain. I reviewed meds- he missed one dose on Friday morning. He admits to same. Pill box filled and confirmed for one week. We reviewed upcoming appointments. He had no concerns at present. We discussed fluids and diet- he is doing well with same. No mobility issues. He agreed to visit in one week. Home visit complete.     [name], EMT-Paramedic [phone]  ACTION: Home visit completed    Patient Care Team: Buckner Malta, MD as PCP - General (Family Medicine) Rennis Golden Lisette Abu, MD as PCP - Cardiology (Cardiology) Lanier Prude, MD as PCP - Electrophysiology (Cardiology) Weston Settle, MD as Consulting Physician (Oncology) Dora Sims, DDS as Referring Physician (Oral Surgery) Debroah Baller, MD as Consulting Physician (Urology)  Patient Active Problem List   Diagnosis Date Noted   Vitamin D toxicity 07/24/2022   Anemia due to stage 4 chronic kidney disease 03/20/2022   Hypercalcemia of malignancy 02/16/2021   Chronic systolic heart failure    Abnormal stress test 12/05/2020   Depressed left ventricular ejection fraction 12/05/2020   Nonrheumatic mitral  valve regurgitation 12/05/2020   Pre-diabetes 12/05/2020   Pulmonary hypertension 12/05/2020   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   Severe pulmonary hypertension 12/05/2020   Tobacco use 12/05/2020   Dilated cardiomyopathy 12/05/2020   HFrEF (heart failure with reduced ejection fraction) 11/23/2020   CAD (coronary artery disease) 11/23/2020   GERD (gastroesophageal reflux disease)    Hyperlipidemia    Hypertension    Secondary malignant neoplasm of bone and bone marrow 06/02/2020   Malignant neoplasm of prostate 06/02/2020   AKI (acute kidney injury) 11/25/2019   Hyperphosphatemia 11/25/2019   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 chronic kidney disease 08/24/2019   Hyperkalemia 08/24/2019   Metabolic bone disease 08/24/2019   Vitamin D deficiency 08/24/2019   Benign hypertension with chronic kidney disease, stage III 05/20/2019   Decreased cardiac ejection fraction 05/20/2019   Benign hypertension with CKD (chronic kidney disease) stage IV 05/20/2019   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 02/24/2019   Olecranon bursitis of left elbow 06/25/2018   B12 deficiency 03/22/2018   Gastroesophageal reflux disease without esophagitis 03/22/2018   Chronic renal insufficiency, stage III (moderate) 11/27/2015   Aortic valve disorder 07/08/2014   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   Cataract 11/2013   Essential hypertension 07/06/2012   Coronary arteriosclerosis 07/06/2012   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   Left bundle branch block 07/06/2012   Mixed hyperlipidemia 07/06/2012   Cancer 08/2008  S/P radiation therapy > 12 wks ago 2010    Current Outpatient Medications:    aspirin EC 81 MG tablet, Take 1 tablet (81 mg total) by mouth daily., Disp: 90 tablet, Rfl: 3   Cetirizine HCl 10 MG CAPS, Take 1 capsule by mouth daily., Disp: , Rfl:    cholestyramine light (PREVALITE) 4 g packet, Take 4 g by  mouth daily., Disp: , Rfl:    dapagliflozin propanediol (FARXIGA) 10 MG TABS tablet, Take 1 tablet (10 mg total) by mouth daily before breakfast. Pharmacy note: please split bill Farxiga to [facility] RX BIN: [id number] RX PCN: [id number] RX GROUP: [id number] RX ID: [id number], Disp: 90 tablet, Rfl: 3   digoxin (LANOXIN) 0.125 MG tablet, Take 1 tablet (0.125 mg total) by mouth every other day., Disp: 45 tablet, Rfl: 3   enzalutamide (XTANDI) 40 MG tablet, Take 160 mg by mouth daily., Disp: , Rfl:    Evolocumab (REPATHA SURECLICK) 140 MG/ML SOAJ, INJECT 1 DOSE UNDER THE SKIN EVERY 14 DAYS, Disp: 6 mL, Rfl: 3   metoprolol succinate (TOPROL XL) 25 MG 24 hr tablet, Take 0.5 tablets (12.5 mg total) by mouth at bedtime., Disp: 45 tablet, Rfl: 3   Multiple Vitamins-Minerals (PRESERVISION/LUTEIN) CAPS, Take 1 capsule by mouth at bedtime., Disp: , Rfl:    omeprazole (PRILOSEC) 40 MG capsule, Take 40 mg by mouth daily., Disp: , Rfl:    pregabalin (LYRICA) 100 MG capsule, Take 100 mg by mouth 3 (three) times daily., Disp: , Rfl:    rosuvastatin (CRESTOR) 5 MG tablet, Take 5 mg by mouth daily., Disp: , Rfl:    spironolactone (ALDACTONE) 25 MG tablet, Take 1 tablet (25 mg total) by mouth daily., Disp: 90 tablet, Rfl: 1   tamsulosin (FLOMAX) 0.4 MG CAPS capsule, Take 1 capsule (0.4 mg total) by mouth daily., Disp: 30 capsule, Rfl: 0   torsemide (DEMADEX) 20 MG tablet, Alternate 60 mg with 40 mg, Disp: 180 tablet, Rfl: 3   traZODone (DESYREL) 100 MG tablet, Take 50 mg by mouth at bedtime., Disp: , Rfl:    diphenhydrAMINE (BENADRYL) 25 MG tablet, Take 25 mg by mouth at bedtime. (Patient not taking: Reported on 12/11/2022), Disp: , Rfl:    pentoxifylline (TRENTAL) 400 MG CR tablet, Take 400 mg by mouth 3 (three) times daily with meals. (Patient not taking: Reported on 12/11/2022), Disp: , Rfl:  Allergies  Allergen Reactions   Ezetimibe Other (See Comments)    Myalgia   Gabapentin Itching and Other (See Comments)    Sore  throat, breakouts   Statins Other (See Comments)    Myalgias (intolerance)     Social History   Socioeconomic History   Marital status: Married    Spouse name: Not on file   Number of children: Not on file   Years of education: Not on file   Highest education level: Not on file  Occupational History   Not on file  Tobacco Use   Smoking status: Every Day    Packs/day: 0.50    Years: 50.00    Additional pack years: 0.00    Total pack years: 25.00    Types: Cigarettes   Smokeless tobacco: Never  Vaping Use   Vaping Use: Never used  Substance and Sexual Activity   Alcohol use: Not Currently   Drug use: Never   Sexual activity: Not on file  Other Topics Concern   Not on file  Social History Narrative   Not on file   Social Determinants  of Health   Financial Resource Strain: Low Risk  (05/22/2022)   Overall Financial Resource Strain (CARDIA)    Difficulty of Paying Living Expenses: Not very hard  Food Insecurity: No Food Insecurity (05/22/2022)   Hunger Vital Sign    Worried About Running Out of Food in the Last Year: Never true    Ran Out of Food in the Last Year: Never true  Transportation Needs: No Transportation Needs (05/22/2022)   PRAPARE - Administrator, Civil Service (Medical): No    Lack of Transportation (Non-Medical): No  Physical Activity: Not on file  Stress: Not on file  Social Connections: Not on file  Intimate Partner Violence: Not on file    Physical Exam      Future Appointments  Date Time Provider Department Center  12/19/2022  8:40 AM Laurey Morale, MD [facility] None  12/23/2022  3:40 PM [facility] DEVICE REMOTES [facility]  02/11/2023  9:30 AM [facility] None  02/12/2023  9:30 AM Weston Settle, MD [facility] None  02/26/2023  9:15 AM McCaughan, Dia D, DPM [facility]  03/24/2023  3:40 PM [facility] DEVICE REMOTES [facility]  06/23/2023  3:40 PM [facility] DEVICE REMOTES [facility]  09/22/2023  3:40 PM [facility] DEVICE REMOTES [facility]

## 2022-12-11 NOTE — Patient Outreach (Signed)
  Care Coordination   12/11/2022 Name: Tommy Ward MRN: [id number] DOB: 1940/11/28   Care Coordination Outreach Attempts:  A second unsuccessful outreach was attempted today to offer the patient with information about available care coordination services as a benefit of their health plan.     Follow Up Plan:  Additional outreach attempts will be made to offer the patient care coordination information and services.   Encounter Outcome:  No Answer   Care Coordination Interventions:  No, not indicated    Rowe Pavy, RN, BSN, [facility] [employer] [phone]

## 2022-12-12 ENCOUNTER — Telehealth: Payer: Self-pay

## 2022-12-12 NOTE — Patient Outreach (Signed)
  Care Coordination   12/12/2022 Name: Tommy Ward MRN: [id number] DOB: Dec 14, 1940   Care Coordination Outreach Attempts:  A third unsuccessful outreach was attempted today to offer the patient with information about available care coordination services as a benefit of their health plan.   Follow Up Plan:  No further outreach attempts will be made at this time. We have been unable to contact the patient to offer or enroll patient in care coordination services  Encounter Outcome:  No Answer   Care Coordination Interventions:  No, not indicated    Rowe Pavy, RN, BSN, [facility] Coordinator [phone]

## 2022-12-19 ENCOUNTER — Ambulatory Visit (HOSPITAL_COMMUNITY)
Admission: RE | Admit: 2022-12-19 | Discharge: 2022-12-19 | Disposition: A | Discharge status: Home / Self Care | Payer: Medicare Other | Source: Ambulatory Visit | Attending: Cardiology | Admitting: Cardiology

## 2022-12-19 ENCOUNTER — Encounter (HOSPITAL_COMMUNITY): Payer: Self-pay | Admitting: Cardiology

## 2022-12-19 ENCOUNTER — Other Ambulatory Visit (HOSPITAL_COMMUNITY): Payer: Self-pay

## 2022-12-19 VITALS — BP 110/70 | HR 69 | Wt 166.4 lb

## 2022-12-19 DIAGNOSIS — N1832 Chronic kidney disease, stage 3b: Secondary | ICD-10-CM | POA: Insufficient documentation

## 2022-12-19 DIAGNOSIS — Z79899 Other long term (current) drug therapy: Secondary | ICD-10-CM | POA: Diagnosis not present

## 2022-12-19 DIAGNOSIS — K761 Chronic passive congestion of liver: Secondary | ICD-10-CM | POA: Insufficient documentation

## 2022-12-19 DIAGNOSIS — Z7982 Long term (current) use of aspirin: Secondary | ICD-10-CM | POA: Diagnosis not present

## 2022-12-19 DIAGNOSIS — I130 Hypertensive heart and chronic kidney disease with heart failure and stage 1 through stage 4 chronic kidney disease, or unspecified chronic kidney disease: Secondary | ICD-10-CM | POA: Diagnosis not present

## 2022-12-19 DIAGNOSIS — I340 Nonrheumatic mitral (valve) insufficiency: Secondary | ICD-10-CM | POA: Diagnosis not present

## 2022-12-19 DIAGNOSIS — I2510 Atherosclerotic heart disease of native coronary artery without angina pectoris: Secondary | ICD-10-CM | POA: Insufficient documentation

## 2022-12-19 DIAGNOSIS — R188 Other ascites: Secondary | ICD-10-CM | POA: Insufficient documentation

## 2022-12-19 DIAGNOSIS — I255 Ischemic cardiomyopathy: Secondary | ICD-10-CM | POA: Insufficient documentation

## 2022-12-19 DIAGNOSIS — I5022 Chronic systolic (congestive) heart failure: Secondary | ICD-10-CM | POA: Diagnosis not present

## 2022-12-19 DIAGNOSIS — Z87891 Personal history of nicotine dependence: Secondary | ICD-10-CM | POA: Insufficient documentation

## 2022-12-19 LAB — BASIC METABOLIC PANEL
Anion gap: 9 (ref 5–15)
BUN: 31 mg/dL — ABNORMAL HIGH (ref 8–23)
CO2: 28 mmol/L (ref 22–32)
Calcium: 9.6 mg/dL (ref 8.9–10.3)
Chloride: 99 mmol/L (ref 98–111)
Creatinine, Ser: 1.87 mg/dL — ABNORMAL HIGH (ref 0.61–1.24)
GFR, Estimated: 36 mL/min — ABNORMAL LOW (ref >60)
Glucose, Bld: 114 mg/dL — ABNORMAL HIGH (ref 70–99)
Potassium: 3.5 mmol/L (ref 3.5–5.1)
Sodium: 136 mmol/L (ref 135–145)

## 2022-12-19 LAB — BRAIN NATRIURETIC PEPTIDE: B Natriuretic Peptide: 500.9 pg/mL — ABNORMAL HIGH (ref 0.0–100.0)

## 2022-12-19 LAB — DIGOXIN LEVEL: Digoxin Level: 0.8 ng/mL (ref 0.8–2.0)

## 2022-12-19 MED ORDER — METOPROLOL SUCCINATE ER 25 MG PO TB24: 25.0000 mg | ORAL_TABLET | Freq: Every day | ORAL | 3 refills | Status: DC

## 2022-12-19 NOTE — Patient Instructions (Signed)
INCREASE Toprol XL to  25 mg nightly.  Labs done today, your results will be available in MyChart, we will contact you for abnormal readings.  Your physician recommends that you schedule a follow-up appointment in: 3 months  If you have any questions or concerns before your next appointment please send [place] a message through [place] or call our office at [phone].    TO LEAVE A MESSAGE FOR THE NURSE SELECT OPTION 2, PLEASE LEAVE A MESSAGE INCLUDING: YOUR NAME DATE OF BIRTH CALL BACK NUMBER REASON FOR CALL**this is important as we prioritize the call backs  YOU WILL RECEIVE A CALL BACK THE SAME DAY AS LONG AS YOU CALL BEFORE 4:00 PM At the [facility], you and your health needs are our priority. As part of our continuing mission to provide you with exceptional heart care, we have created designated Provider Care Teams. These Care Teams include your primary Cardiologist (physician) and Advanced Practice Providers (APPs- Physician Assistants and Nurse Practitioners) who all work together to provide you with the care you need, when you need it.   You may see any of the following providers on your designated Care Team at your next follow up: Dr Arvilla Meres Dr Marca Ancona Dr. Marcos Eke, NP Robbie Lis, [place] [facility] [place], [place] Brynda Peon, NP Karle Plumber, PharmD   Please be sure to bring in all your medications bottles to every appointment.    Thank you for choosing [facility]

## 2022-12-19 NOTE — Progress Notes (Signed)
Paramedicine Encounter  Patient ID: Tommy Ward, male, DOB: March 20, 1941, 82 y.o.,  MRN: [id number]  Met patient in clinic today with Dr. Shirlee Ward, Tommy Ward reports no complaints aside from his chronic neuropathy. He has no shortness of breath at rest or on exertion, no lower leg edema, lungs clear. He is med compliant with no difficulties taking his medications as placed in the pill box. Today on exam Dr. Shirlee Ward reports Tommy Ward is looking good with no concerns at present. He increased Metoprolol to full tablet at 25mg  at bedtime. He recommends a 3 month APP visit. I reviewed meds and filled same for one week. I will plan to follow up in one week.    Weight @ clinic- 166lbs  B/P- 110/70 P- 69 SP02- 98%   Med changes: increase metoprolol to 25mg  full tablet.   Clinic visit included EKG and LABS as well.   Result notes reviewed at 1700 and no further changes made.    [name], EMT-Paramedic [phone] 12/19/2022

## 2022-12-19 NOTE — Progress Notes (Signed)
PCP: Buckner Malta, MD Cardiology: Dr. Tomie China HF Cardiology: Dr. Shirlee Latch  82 y.o. with history of prostate cancer, CKD stage 3, CAD, and ischemic cardiomyopathy was referred by Dr. Tomie China for evaluation of CHF.  Patient developed gradually worsening exertional dyspnea starting early this year.  The shortness of breath has been especially bad for about 1.5 months.  He had an echo done in 4/22, showing EF < 20%, moderate-severe LV dilation, normal RV, severe MR.  LHC/RHC was done showing low cardiac index at 1.81 and occluded mid LAD.  No intervention.  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   TEE was done 6/22, showing EF < 20% with septal-lateral dyssynchrony, mildly decreased RV systolic function, moderate TR, moderate central MR with ERO 0.2 cm^2.  Echo in 7/22 similarly showed EF 20%, normal RV, moderate MR.  Patient had St Jude CRT-D device implanted.  Echo in 1/23 showed EF <20% with moderate LV dilation, mildly decreased RV systolic function, normal IVC.   Echo in 9/23 showed EF 20-25%, mild MR.   Today he returns for HF follow up with his wife and paramedic, Heather. He seems to be doing well overall.  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  He also has bilateral knee pain.  No significant exertional dyspnea or chest pain. No orthopnea/PND.  Weight is stable.   ECG (personally reviewed): A-BiV paced  Labs (5/22): K 3.4, creatinine 1.7 => 2.15, AST 123 => 189, ALT 322 =>190, tbili 1.7, Hgb 12, LDL 38 Labs (6/22): Viral hepatitis serologies negative Labs (8/22): K 4.2, creatinine 1.87 Labs (10/22): K 3.7, creatinine 2 Labs (11/22): LDL 159 Labs (12/22): K 3.7, creatinine 2 Labs (4/23): K 4.4, creatinine 2.7, LFTs normal Labs (7/23): K 4.4, creatinine 2.35 Labs (10/23): K 4.0, creatinine 2.4, LFTs normal Labs (2/24): K 4.9, creatinine 2.15, LDL 46, TGs 226  PMH:  1.  Hyperlipidemia 2. HTN 3. CKD stage 3 4. 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  Treated with radiation and currently on denosumab.   5. Cholecystectomy 6. CAD: LHC in 4/22 with 80% proximal RCA stenosis (nondominant), totally occluded mLAD with collaterals, 60% D1.  No intervention.  7. Chronic systolic CHF: Ischemic cardiomyopathy.  St Jude CRT-D.  - Echo (4/22) with EF < 20%, moderate-severe LV dilation, normal RV, severe LAE, severe MR.  - RHC (4/22): mean RA 3, PA 39/10, mean PCWP 12, CI 1.81 - Echo (6/22): EF < 20% with septal-lateral dyssynchrony, mildly decreased RV systolic function, moderate TR, moderate central MR with ERO 0.2 cm^2.  - Echo (7/22): EF <20%, mild LVH, normal RV, moderate MR.  - Echo (1/23: EF <20% with moderate LV dilation, mildly decreased RV systolic function, normal IVC, mild-moderate MR. 8. Mitral regurgitation: Severe on 4/22 echo.  - TEE (6/22) with moderate central MR with ERO 0.2 cm^2 (functional, annular dilatation).  - Echo (1/23) with mild-moderate MR.  - Echo (9/23): EF 20-25%, mild MR.  9. Elevated LFTs: Viral hepatitis labs negative.  RUQ [place] (6/22) was not suggestive of cirrhosis, ascites noted.  10. Hypercalcemia: Due to Ca supplementation in setting of renal dysfunction.  11. Chronic LBBB  Social History   Socioeconomic History   Marital status: Married    Spouse name: Not on file   Number of children: Not on file   Years of education: Not on file   Highest education level: Not on file  Occupational History   Not on file  Tobacco Use   Smoking status: Every Day    Packs/day: 0.50    Years: 50.00    Additional pack years: 0.00    Total pack years: 25.00    Types: Cigarettes   Smokeless tobacco: Never  Vaping Use   Vaping Use: Never used  Substance and Sexual Activity   Alcohol use: Not Currently   Drug use: Never   Sexual activity: Not on file  Other Topics Concern   Not on file  Social History Narrative   Not on file   Social  Determinants of Health   Financial Resource Strain: Low Risk  (05/22/2022)   Overall Financial Resource Strain (CARDIA)    Difficulty of Paying Living Expenses: Not very hard  Food Insecurity: No Food Insecurity (05/22/2022)   Hunger Vital Sign    Worried About Running Out of Food in the Last Year: Never true    Ran Out of Food in the Last Year: Never true  Transportation Needs: No Transportation Needs (05/22/2022)   PRAPARE - Administrator, Civil Service (Medical): No    Lack of Transportation (Non-Medical): No  Physical Activity: Not on file  Stress: Not on file  Social Connections: Not on file  Intimate Partner Violence: Not on file   Family History  Problem Relation Age of Onset   Leukemia Mother    Prostate cancer Father    Heart attack Brother    Colon cancer Neg Hx    Rectal cancer Neg Hx    Stomach cancer Neg Hx    Esophageal cancer Neg Hx    ROS: All systems reviewed and negative except as per HPI.   Current Outpatient Medications  Medication Sig Dispense Refill   aspirin EC 81 MG tablet Take 1 tablet (81 mg total) by mouth daily. 90 tablet 3   Cetirizine HCl 10 MG CAPS Take 1 capsule by mouth daily.     cholestyramine light (PREVALITE) 4 g packet Take 4 g by mouth 2 (two) times daily.     dapagliflozin propanediol (FARXIGA) 10 MG TABS tablet Take 1 tablet (10 mg total) by mouth daily before breakfast. Pharmacy note: please split bill Farxiga to [facility] BIN: [id number] RX PCN: [id number] RX GROUP: [id number] RX ID: [id number] 90 tablet 3   digoxin (LANOXIN) 0.125 MG tablet Take 1 tablet (0.125 mg total) by mouth every other day. 45 tablet 3   enzalutamide (XTANDI) 40 MG tablet Take 160 mg by mouth daily.     Evolocumab (REPATHA SURECLICK) 140 MG/ML SOAJ INJECT 1 DOSE UNDER THE SKIN EVERY 14 DAYS 6 mL 3   Multiple Vitamins-Minerals (PRESERVISION/LUTEIN) CAPS Take 1 capsule by mouth at bedtime.     omeprazole (PRILOSEC) 40 MG capsule Take 40 mg by mouth daily.      pregabalin (LYRICA) 100 MG capsule Take 100 mg by mouth 3 (three) times daily.     rosuvastatin (CRESTOR) 5 MG tablet Take 5 mg by mouth daily.     spironolactone (ALDACTONE) 25 MG tablet Take 1 tablet (25 mg total) by mouth daily. 90 tablet 1   tamsulosin (FLOMAX) 0.4 MG CAPS capsule Take 1 capsule (0.4 mg total) by mouth daily. 30 capsule 0   torsemide (DEMADEX) 20 MG tablet Alternate 60 mg with 40 mg 180 tablet 3   traZODone (DESYREL) 100 MG tablet Take 50 mg by mouth at bedtime.     metoprolol succinate (TOPROL XL) 25 MG 24 hr tablet Take 1 tablet (25 mg total) by  mouth at bedtime. 90 tablet 3   No current facility-administered medications for this encounter.   Wt Readings from Last 3 Encounters:  12/19/22 75.5 kg (166 lb 6.4 oz)  12/11/22 73.9 kg (163 lb)  12/04/22 74.5 kg (164 lb 3.2 oz)   BP 110/70   Pulse 69   Wt 75.5 kg (166 lb 6.4 oz)   SpO2 98%   BMI 24.57 kg/m  General: NAD Neck: No JVD, no thyromegaly or thyroid nodule.  Lungs: Clear to auscultation bilaterally with normal respiratory effort. CV: Nondisplaced PMI.  Heart regular S1/S2, no S3/S4, no murmur.  No peripheral edema.  No carotid bruit.  Normal pedal pulses.  Abdomen: Soft, nontender, no hepatosplenomegaly, no distention.  Skin: Intact without lesions or rashes.  Neurologic: Alert and oriented x 3.  Psych: Normal affect. Extremities: No clubbing or cyanosis.  HEENT: Normal.   Assessment/Plan: 1. CAD: Occluded mid LAD and 80% proximal stenosis in nondominant RCA in 4/22.  No intervention.  No chest pain.  - Continue ASA 81 mg daily.  - Continue rosuvastatin, Repatha.  Good lipids 2/24.  2. Chronic systolic CHF: Ischemic cardiomyopathy.  Echo in 4/22 with EF < 20%, moderate-severe LV dilation, normal RV, severe LAE, severe MR.  RHC in 4/22 with CI 1.8.  TEE in 6/22 with EF <20%, dyssynchrony, mildly decreased RV systolic function.  Echo in 7/22 with EF < 20%, moderate MR.  St Jude CRT-D device placed.  Echo in  1/23 showed EF < 20%, mild RV dysfunction.  Echo in 9/23 showed EF 20-25%, mild MR. No change to echo since CRT but he has felt better symptomatically.  NYHA class I-II, he is not volume overloaded on exam.  GDMT has been limited by renal function (CKD stage 3b). - Continue torsemide 60 daily alternating with 40 daily, BMET/BNP today.  - Increase Toprol XL to 25 mg daily.  - Continue Farxiga 10 mg daily.     - Continue spironolactone 25 mg daily.   - Continue digoxin 0.125 qod.  Check digoxin level today.    - Off Entresto and losartan with CKD and soft BP.     3. Mitral regurgitation: Severe on 5/22 TTE. However, TEE in 6/22 showed moderate functional central MR.   Mild-moderate MR on echo 1/23 post-CRT, mild MR on 9/23 echo.  4. Elevated LFTs: Viral hepatitis labs negative.  Abdominal US did not show cirrhosis but did show ascites.  Etiology uncertain, ?congestive hepatopathy.  5. CKD stage 3b: BMET today.  6. Smoking: He quit in 7/23.  Followup in 3 months with APP.   Marca Ancona  12/19/2022

## 2022-12-23 ENCOUNTER — Ambulatory Visit (INDEPENDENT_AMBULATORY_CARE_PROVIDER_SITE_OTHER): Payer: Medicare Other

## 2022-12-23 DIAGNOSIS — I420 Dilated cardiomyopathy: Secondary | ICD-10-CM

## 2022-12-23 LAB — CUP PACEART REMOTE DEVICE CHECK
Battery Remaining Longevity: 65 mo
Battery Remaining Percentage: 75 %
Battery Voltage: 2.96 V
Brady Statistic AP VP Percent: 67 %
Brady Statistic AP VS Percent: 1.3 %
Brady Statistic AS VP Percent: 26 %
Brady Statistic AS VS Percent: 2 %
Brady Statistic RA Percent Paced: 65 %
Date Time Interrogation Session: 20240506030140
HighPow Impedance: 77 Ohm
Implantable Lead Connection Status: 753985
Implantable Lead Connection Status: 753985
Implantable Lead Connection Status: 753985
Implantable Lead Implant Date: 20220919
Implantable Lead Implant Date: 20220919
Implantable Lead Implant Date: 20220919
Implantable Lead Location: 753858
Implantable Lead Location: 753859
Implantable Lead Location: 753860
Implantable Pulse Generator Implant Date: 20220919
Lead Channel Impedance Value: 490 Ohm
Lead Channel Impedance Value: 540 Ohm
Lead Channel Impedance Value: 790 Ohm
Lead Channel Pacing Threshold Amplitude: 0.5 V
Lead Channel Pacing Threshold Amplitude: 0.75 V
Lead Channel Pacing Threshold Amplitude: 1 V
Lead Channel Pacing Threshold Pulse Width: 0.5 ms
Lead Channel Pacing Threshold Pulse Width: 0.5 ms
Lead Channel Pacing Threshold Pulse Width: 0.7 ms
Lead Channel Sensing Intrinsic Amplitude: 0.8 mV
Lead Channel Sensing Intrinsic Amplitude: 12 mV
Lead Channel Setting Pacing Amplitude: 1.75 V
Lead Channel Setting Pacing Amplitude: 2 V
Lead Channel Setting Pacing Amplitude: 2.5 V
Lead Channel Setting Pacing Pulse Width: 0.5 ms
Lead Channel Setting Pacing Pulse Width: 0.7 ms
Lead Channel Setting Sensing Sensitivity: 0.5 mV
Pulse Gen Serial Number: 111046561
Zone Setting Status: 755011

## 2022-12-25 ENCOUNTER — Other Ambulatory Visit (HOSPITAL_COMMUNITY): Payer: Self-pay

## 2022-12-25 NOTE — Progress Notes (Signed)
Paramedicine Encounter    Patient ID: Tommy Ward, male    DOB: 02/20/1941, 82 y.o.   MRN: [id number]   Arrived for home visit for [facility] who reports to be feeling well with no shortness of breath, dizziness, chest pain, or swelling. Weight is stable. Vitals were obtained and were within normal limits. He has had no dizziness or symptoms since the increase in Metoprolol. We reviewed medications and I filled pill box for one week. I left a detailed note on how to fill pill box next week during my absence. We reviewed upcoming appointments and confirmed same. No refills needed. Home visit completed. I will see Tommy Ward in two weeks.   [name], EMT-Paramedic [phone] 12/25/2022   Patient Care Team: Buckner Malta, MD as PCP - General (Family Medicine) Rennis Golden Lisette Abu, MD as PCP - Cardiology (Cardiology) Lanier Prude, MD as PCP - Electrophysiology (Cardiology) Weston Settle, MD as Consulting Physician (Oncology) Dora Sims, DDS as Referring Physician (Oral Surgery) Debroah Baller, MD as Consulting Physician (Urology)  Patient Active Problem List   Diagnosis Date Noted   Vitamin D toxicity 07/24/2022   Anemia due to stage 4 chronic kidney disease (HCC) 03/20/2022   Hypercalcemia of malignancy 02/16/2021   Chronic systolic heart failure (HCC)    Abnormal stress test 12/05/2020   Depressed left ventricular ejection fraction 12/05/2020   Nonrheumatic mitral valve regurgitation 12/05/2020   Pre-diabetes 12/05/2020   Pulmonary hypertension (HCC) 12/05/2020   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HCC) 12/05/2020   Tobacco use 12/05/2020   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   HFrEF (heart failure with reduced ejection fraction) (HCC) 11/23/2020   CAD (coronary artery disease) 11/23/2020   GERD (gastroesophageal reflux disease)    Hyperlipidemia    Hypertension    Secondary malignant neoplasm of bone and bone  marrow (HCC) 06/02/2020   Malignant neoplasm of prostate (HCC) 06/02/2020   AKI (acute kidney injury) (HCC) 11/25/2019   Hyperphosphatemia 11/25/2019   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   Hyperkalemia 08/24/2019   Metabolic bone disease 08/24/2019   Vitamin D deficiency 08/24/2019   Benign hypertension with chronic kidney disease, stage III (HCC) 05/20/2019   Decreased cardiac ejection fraction 05/20/2019   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) 05/20/2019   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 02/24/2019   Olecranon bursitis of left elbow 06/25/2018   B12 deficiency 03/22/2018   Gastroesophageal reflux disease without esophagitis 03/22/2018   Chronic renal insufficiency, stage III (moderate) (HCC) 11/27/2015   Aortic valve disorder 07/08/2014   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   Cataract 11/2013   Essential hypertension 07/06/2012   Coronary arteriosclerosis 07/06/2012   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   Left bundle branch block 07/06/2012   Mixed hyperlipidemia 07/06/2012   Cancer (HCC) 08/2008   S/P radiation therapy > 12 wks ago 2010    Current Outpatient Medications:    aspirin EC 81 MG tablet, Take 1 tablet (81 mg total) by mouth daily., Disp: 90 tablet, Rfl: 3   Cetirizine HCl 10 MG CAPS, Take 1 capsule by mouth daily., Disp: , Rfl:    cholestyramine light (PREVALITE) 4 g packet, Take 4 g by mouth 2 (two) times daily., Disp: , Rfl:    dapagliflozin propanediol (FARXIGA) 10 MG TABS tablet, Take 1 tablet (10 mg total) by mouth daily before breakfast. Pharmacy note: please split  bill Farxiga to [facility] BIN: [id number] RX PCN: [id number] RX GROUP: [id number] RX ID: [id number], Disp: 90 tablet, Rfl: 3   digoxin (LANOXIN) 0.125 MG tablet, Take 1 tablet (0.125 mg total) by mouth every other day., Disp: 45 tablet, Rfl: 3   enzalutamide (XTANDI)  40 MG tablet, Take 160 mg by mouth daily., Disp: , Rfl:    Evolocumab (REPATHA SURECLICK) 140 MG/ML SOAJ, INJECT 1 DOSE UNDER THE SKIN EVERY 14 DAYS, Disp: 6 mL, Rfl: 3   metoprolol succinate (TOPROL XL) 25 MG 24 hr tablet, Take 1 tablet (25 mg total) by mouth at bedtime., Disp: 90 tablet, Rfl: 3   Multiple Vitamins-Minerals (PRESERVISION/LUTEIN) CAPS, Take 1 capsule by mouth at bedtime., Disp: , Rfl:    omeprazole (PRILOSEC) 40 MG capsule, Take 40 mg by mouth daily., Disp: , Rfl:    pregabalin (LYRICA) 100 MG capsule, Take 100 mg by mouth 3 (three) times daily., Disp: , Rfl:    rosuvastatin (CRESTOR) 5 MG tablet, Take 5 mg by mouth daily., Disp: , Rfl:    spironolactone (ALDACTONE) 25 MG tablet, Take 1 tablet (25 mg total) by mouth daily., Disp: 90 tablet, Rfl: 1   tamsulosin (FLOMAX) 0.4 MG CAPS capsule, Take 1 capsule (0.4 mg total) by mouth daily., Disp: 30 capsule, Rfl: 0   torsemide (DEMADEX) 20 MG tablet, Alternate 60 mg with 40 mg, Disp: 180 tablet, Rfl: 3   traZODone (DESYREL) 100 MG tablet, Take 50 mg by mouth at bedtime., Disp: , Rfl:  Allergies  Allergen Reactions   Ezetimibe Other (See Comments)    Myalgia   Gabapentin Itching and Other (See Comments)    Sore throat, breakouts   Statins Other (See Comments)    Myalgias (intolerance)     Social History   Socioeconomic History   Marital status: Married    Spouse name: Not on file   Number of children: Not on file   Years of education: Not on file   Highest education level: Not on file  Occupational History   Not on file  Tobacco Use   Smoking status: Every Day    Packs/day: 0.50    Years: 50.00    Additional pack years: 0.00    Total pack years: 25.00    Types: Cigarettes   Smokeless tobacco: Never  Vaping Use   Vaping Use: Never used  Substance and Sexual Activity   Alcohol use: Not Currently   Drug use: Never   Sexual activity: Not on file  Other Topics Concern   Not on file  Social History Narrative    Not on file   Social Determinants of Health   Financial Resource Strain: Low Risk  (05/22/2022)   Overall Financial Resource Strain (CARDIA)    Difficulty of Paying Living Expenses: Not very hard  Food Insecurity: No Food Insecurity (05/22/2022)   Hunger Vital Sign    Worried About Running Out of Food in the Last Year: Never true    Ran Out of Food in the Last Year: Never true  Transportation Needs: No Transportation Needs (05/22/2022)   PRAPARE - Administrator, Civil Service (Medical): No    Lack of Transportation (Non-Medical): No  Physical Activity: Not on file  Stress: Not on file  Social Connections: Not on file  Intimate Partner Violence: Not on file    Physical Exam      Future Appointments  Date Time Provider Department Center  02/11/2023  9:30 AM [facility] None  02/12/2023  9:30 AM Weston Settle, MD [facility] None  02/26/2023  9:15 AM McCaughan, Dia D, DPM [facility]  03/21/2023  9:00 AM [facility] PA/NP [facility] None  03/24/2023  3:40 PM [facility] DEVICE REMOTES [facility]  06/23/2023  3:40 PM [facility] DEVICE REMOTES [facility]  09/22/2023  3:40 PM [facility] DEVICE REMOTES [facility]     ACTION: Home visit completed

## 2023-01-08 ENCOUNTER — Other Ambulatory Visit (HOSPITAL_COMMUNITY): Payer: Self-pay

## 2023-01-08 NOTE — Progress Notes (Signed)
Paramedicine Encounter    Patient ID: Tommy Ward, male    DOB: 1940-09-09, 82 y.o.   MRN: [id number]   Complaints- chronic neuropathy pain  Assessment- CAOX4, warm and dry, no lower leg swelling, lungs clear, vitals within range.   Compliance with meds- missed one evening dose over the last week.   Pill box filled- for one week.   Refills needed- tamsulosin, repatha   Meds changes since last visit- none     Social changes- none  Arrived for home visit for Tommy Ward who reports to be feeling well this week aside from his normal neuropathy pain. He denied any shortness of breath, chest pain, no lower leg swelling. Fue's weight is up 7 lbs over the last week- he reports he has been eating a lot more this week with having family over and having more "fatty" meals over the last week. He denied any shortness of breath, or leg swelling. We reviewed meds and confirmed same filling pill box for one week. We reviewed HF diet and recommended to get back on track. He agreed with this. He will be going out of town on 5/28 so I will see him on Tuesday next week. He agreed with plan. Visit complete.   BP 118/62   Pulse 73   Resp 16   Wt 171 lb 12.8 oz (77.9 kg)   SpO2 98%   BMI 25.37 kg/m  Weight yesterday-- 170lbs  Last visit weight-- 164lbs    [name], EMT-Paramedic [phone]  ACTION: Home visit completed    Patient Care Team: Buckner Malta, MD as PCP - General (Family Medicine) Rennis Golden Lisette Abu, MD as PCP - Cardiology (Cardiology) Lanier Prude, MD as PCP - Electrophysiology (Cardiology) Weston Settle, MD as Consulting Physician (Oncology) Dora Sims, DDS as Referring Physician (Oral Surgery) Debroah Baller, MD as Consulting Physician (Urology)  Patient Active Problem List   Diagnosis Date Noted   Vitamin D toxicity 07/24/2022   Anemia due to stage 4 chronic kidney disease (HCC) 03/20/2022   Hypercalcemia of malignancy 02/16/2021   Chronic  systolic heart failure (HCC)    Abnormal stress test 12/05/2020   Depressed left ventricular ejection fraction 12/05/2020   Nonrheumatic mitral valve regurgitation 12/05/2020   Pre-diabetes 12/05/2020   Pulmonary hypertension (HCC) 12/05/2020   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HCC) 12/05/2020   Tobacco use 12/05/2020   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   HFrEF (heart failure with reduced ejection fraction) (HCC) 11/23/2020   CAD (coronary artery disease) 11/23/2020   GERD (gastroesophageal reflux disease)    Hyperlipidemia    Hypertension    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   Malignant neoplasm of prostate (HCC) 06/02/2020   AKI (acute kidney injury) (HCC) 11/25/2019   Hyperphosphatemia 11/25/2019   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   Hyperkalemia 08/24/2019   Metabolic bone disease 08/24/2019   Vitamin D deficiency 08/24/2019   Benign hypertension with chronic kidney disease, stage III (HCC) 05/20/2019   Decreased cardiac ejection fraction 05/20/2019   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) 05/20/2019   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 02/24/2019   Olecranon bursitis of left elbow 06/25/2018   B12 deficiency 03/22/2018   Gastroesophageal reflux disease without esophagitis 03/22/2018   Chronic renal insufficiency, stage III (moderate) (HCC) 11/27/2015   Aortic valve disorder 07/08/2014   Carotid artery occlusion  07/08/2014   Cataract 11/2013   Essential hypertension 07/06/2012   Coronary arteriosclerosis 07/06/2012   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   Left bundle branch block 07/06/2012   Mixed hyperlipidemia 07/06/2012   Cancer (HCC) 08/2008   S/P radiation therapy > 12 wks ago 2010    Current Outpatient Medications:    aspirin EC 81  MG tablet, Take 1 tablet (81 mg total) by mouth daily., Disp: 90 tablet, Rfl: 3   Cetirizine HCl 10 MG CAPS, Take 1 capsule by mouth daily., Disp: , Rfl:    cholestyramine light (PREVALITE) 4 g packet, Take 4 g by mouth 2 (two) times daily., Disp: , Rfl:    dapagliflozin propanediol (FARXIGA) 10 MG TABS tablet, Take 1 tablet (10 mg total) by mouth daily before breakfast. Pharmacy note: please split bill Farxiga to [facility] BIN: [id number] RX PCN: [id number] RX GROUP: [id number] RX ID: [id number], Disp: 90 tablet, Rfl: 3   digoxin (LANOXIN) 0.125 MG tablet, Take 1 tablet (0.125 mg total) by mouth every other day., Disp: 45 tablet, Rfl: 3   enzalutamide (XTANDI) 40 MG tablet, Take 160 mg by mouth daily., Disp: , Rfl:    Evolocumab (REPATHA SURECLICK) 140 MG/ML SOAJ, INJECT 1 DOSE UNDER THE SKIN EVERY 14 DAYS, Disp: 6 mL, Rfl: 3   metoprolol succinate (TOPROL XL) 25 MG 24 hr tablet, Take 1 tablet (25 mg total) by mouth at bedtime., Disp: 90 tablet, Rfl: 3   Multiple Vitamins-Minerals (PRESERVISION/LUTEIN) CAPS, Take 1 capsule by mouth at bedtime., Disp: , Rfl:    omeprazole (PRILOSEC) 40 MG capsule, Take 40 mg by mouth daily., Disp: , Rfl:    pregabalin (LYRICA) 100 MG capsule, Take 100 mg by mouth 3 (three) times daily., Disp: , Rfl:    rosuvastatin (CRESTOR) 5 MG tablet, Take 5 mg by mouth daily., Disp: , Rfl:    spironolactone (ALDACTONE) 25 MG tablet, Take 1 tablet (25 mg total) by mouth daily., Disp: 90 tablet, Rfl: 1   tamsulosin (FLOMAX) 0.4 MG CAPS capsule, Take 1 capsule (0.4 mg total) by mouth daily., Disp: 30 capsule, Rfl: 0   torsemide (DEMADEX) 20 MG tablet, Alternate 60 mg with 40 mg, Disp: 180 tablet, Rfl: 3   traZODone (DESYREL) 100 MG tablet, Take 50 mg by mouth at bedtime., Disp: , Rfl:  Allergies  Allergen Reactions   Ezetimibe Other (See Comments)    Myalgia   Gabapentin Itching and Other (See Comments)    Sore throat, breakouts   Statins Other (See Comments)    Myalgias  (intolerance)     Social History   Socioeconomic History   Marital status: Married    Spouse name: Not on file   Number of children: Not on file   Years of education: Not on file   Highest education level: Not on file  Occupational History   Not on file  Tobacco Use   Smoking status: Every Day    Packs/day: 0.50    Years: 50.00    Additional pack years: 0.00    Total pack years: 25.00    Types: Cigarettes   Smokeless tobacco: Never  Vaping Use   Vaping Use: Never used  Substance and Sexual Activity   Alcohol use: Not Currently   Drug use: Never   Sexual activity: Not on file  Other Topics Concern   Not on file  Social History Narrative   Not on file   Social Determinants of Health   Financial Resource  Strain: Low Risk  (05/22/2022)   Overall Financial Resource Strain (CARDIA)    Difficulty of Paying Living Expenses: Not very hard  Food Insecurity: No Food Insecurity (05/22/2022)   Hunger Vital Sign    Worried About Running Out of Food in the Last Year: Never true    Ran Out of Food in the Last Year: Never true  Transportation Needs: No Transportation Needs (05/22/2022)   PRAPARE - Administrator, Civil Service (Medical): No    Lack of Transportation (Non-Medical): No  Physical Activity: Not on file  Stress: Not on file  Social Connections: Not on file  Intimate Partner Violence: Not on file    Physical Exam      Future Appointments  Date Time Provider Department Center  02/11/2023  9:30 AM [facility] None  02/12/2023  9:30 AM Weston Settle, MD [facility] None  02/26/2023  9:15 AM McCaughan, Dia D, DPM [facility]  03/21/2023  9:00 AM [facility] PA/NP [facility] None  03/24/2023  3:40 PM [facility] DEVICE REMOTES [facility]  06/23/2023  3:40 PM [facility] DEVICE REMOTES [facility]  09/22/2023  3:40 PM [facility] DEVICE REMOTES [facility]

## 2023-01-14 ENCOUNTER — Other Ambulatory Visit (HOSPITAL_COMMUNITY): Payer: Self-pay

## 2023-01-14 NOTE — Progress Notes (Signed)
Paramedicine Encounter    Patient ID: Tommy Ward, male    DOB: 02/05/1941, 82 y.o.   MRN: [id number]   Complaints- chronic neuropathy pain   Assessment- CAOX4, warm and dry, seated, no shortness of breath, no chest pain, no swelling, no substantial weight gain, lungs clear, no swelling.   Compliance with meds- no missed doses   Pill box filled- for two weeks   Refills needed- Repatha, Xtandi, Trazadone   Meds changes since last visit- none     Social changes- none   Arrived for home visit for [name] who reports to be feeling well today. He reports having some ongoing chronic neuropathy but denied any chest pain, shortness of breath, dizziness, swelling or increased weight gain. I obtained vitals and they are as noted. I reviewed meds and confirmed same filling pill box for two weeks as he is going out of town. Refills as noted. We reviewed HF education including meds and diet. He agreed with same and agreed to home visit in two weeks.   BP 110/60   Pulse 61   Resp 16   Wt 170 lb (77.1 kg)   SpO2 99%   BMI 25.10 kg/m  Weight yesterday-- didn't weigh  Last visit weight-- 171lbs    [name], EMT-Paramedic [phone]  ACTION: Home visit completed    Patient Care Team: Buckner Malta, MD as PCP - General (Family Medicine) Rennis Golden, Lisette Abu, MD as PCP - Cardiology (Cardiology) Lanier Prude, MD as PCP - Electrophysiology (Cardiology) Weston Settle, MD as Consulting Physician (Oncology) Dora Sims, DDS as Referring Physician (Oral Surgery) Debroah Baller, MD as Consulting Physician (Urology)  Patient Active Problem List   Diagnosis Date Noted   Vitamin D toxicity 07/24/2022   Anemia due to stage 4 chronic kidney disease (HCC) 03/20/2022   Hypercalcemia of malignancy 02/16/2021   Chronic systolic heart failure (HCC)    Abnormal stress test 12/05/2020   Depressed left ventricular ejection fraction 12/05/2020   Nonrheumatic mitral valve  regurgitation 12/05/2020   Pre-diabetes 12/05/2020   Pulmonary hypertension (HCC) 12/05/2020   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HCC) 12/05/2020   Tobacco use 12/05/2020   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   HFrEF (heart failure with reduced ejection fraction) (HCC) 11/23/2020   CAD (coronary artery disease) 11/23/2020   GERD (gastroesophageal reflux disease)    Hyperlipidemia    Hypertension    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   Malignant neoplasm of prostate (HCC) 06/02/2020   AKI (acute kidney injury) (HCC) 11/25/2019   Hyperphosphatemia 11/25/2019   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   Hyperkalemia 08/24/2019   Metabolic bone disease 08/24/2019   Vitamin D deficiency 08/24/2019   Benign hypertension with chronic kidney disease, stage III (HCC) 05/20/2019   Decreased cardiac ejection fraction 05/20/2019   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) 05/20/2019   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 02/24/2019   Olecranon bursitis of left elbow 06/25/2018   B12 deficiency 03/22/2018   Gastroesophageal reflux disease without esophagitis 03/22/2018   Chronic renal insufficiency, stage III (moderate) (HCC) 11/27/2015   Aortic valve disorder 07/08/2014   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   Cataract 11/2013   Essential hypertension 07/06/2012   Coronary arteriosclerosis 07/06/2012   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   Left bundle branch block 07/06/2012   Mixed hyperlipidemia 07/06/2012  Cancer (HCC) 08/2008   S/P radiation therapy > 12 wks ago 2010    Current Outpatient Medications:    aspirin EC 81 MG tablet, Take 1 tablet (81 mg total) by mouth daily., Disp: 90 tablet, Rfl: 3   Cetirizine HCl 10 MG CAPS, Take 1 capsule by mouth daily., Disp: ,  Rfl:    cholestyramine light (PREVALITE) 4 g packet, Take 4 g by mouth 2 (two) times daily., Disp: , Rfl:    dapagliflozin propanediol (FARXIGA) 10 MG TABS tablet, Take 1 tablet (10 mg total) by mouth daily before breakfast. Pharmacy note: please split bill Farxiga to [facility] RX BIN: [id number] RX PCN: [id number] RX GROUP: [id number] RX ID: [id number], Disp: 90 tablet, Rfl: 3   digoxin (LANOXIN) 0.125 MG tablet, Take 1 tablet (0.125 mg total) by mouth every other day., Disp: 45 tablet, Rfl: 3   enzalutamide (XTANDI) 40 MG tablet, Take 160 mg by mouth daily., Disp: , Rfl:    Evolocumab (REPATHA SURECLICK) 140 MG/ML SOAJ, INJECT 1 DOSE UNDER THE SKIN EVERY 14 DAYS, Disp: 6 mL, Rfl: 3   metoprolol succinate (TOPROL XL) 25 MG 24 hr tablet, Take 1 tablet (25 mg total) by mouth at bedtime., Disp: 90 tablet, Rfl: 3   Multiple Vitamins-Minerals (PRESERVISION/LUTEIN) CAPS, Take 1 capsule by mouth at bedtime., Disp: , Rfl:    omeprazole (PRILOSEC) 40 MG capsule, Take 40 mg by mouth daily., Disp: , Rfl:    pregabalin (LYRICA) 100 MG capsule, Take 100 mg by mouth 3 (three) times daily., Disp: , Rfl:    rosuvastatin (CRESTOR) 5 MG tablet, Take 5 mg by mouth daily., Disp: , Rfl:    spironolactone (ALDACTONE) 25 MG tablet, Take 1 tablet (25 mg total) by mouth daily., Disp: 90 tablet, Rfl: 1   tamsulosin (FLOMAX) 0.4 MG CAPS capsule, Take 1 capsule (0.4 mg total) by mouth daily., Disp: 30 capsule, Rfl: 0   torsemide (DEMADEX) 20 MG tablet, Alternate 60 mg with 40 mg, Disp: 180 tablet, Rfl: 3   traZODone (DESYREL) 100 MG tablet, Take 50 mg by mouth at bedtime., Disp: , Rfl:  Allergies  Allergen Reactions   Ezetimibe Other (See Comments)    Myalgia   Gabapentin Itching and Other (See Comments)    Sore throat, breakouts   Statins Other (See Comments)    Myalgias (intolerance)     Social History   Socioeconomic History   Marital status: Married    Spouse name: Not on file   Number of children: Not on file    Years of education: Not on file   Highest education level: Not on file  Occupational History   Not on file  Tobacco Use   Smoking status: Every Day    Packs/day: 0.50    Years: 50.00    Additional pack years: 0.00    Total pack years: 25.00    Types: Cigarettes   Smokeless tobacco: Never  Vaping Use   Vaping Use: Never used  Substance and Sexual Activity   Alcohol use: Not Currently   Drug use: Never   Sexual activity: Not on file  Other Topics Concern   Not on file  Social History Narrative   Not on file   Social Determinants of Health   Financial Resource Strain: Low Risk  (05/22/2022)   Overall Financial Resource Strain (CARDIA)    Difficulty of Paying Living Expenses: Not very hard  Food Insecurity: No Food Insecurity (05/22/2022)   Hunger Vital Sign    Worried  About Running Out of Food in the Last Year: Never true    Ran Out of Food in the Last Year: Never true  Transportation Needs: No Transportation Needs (05/22/2022)   PRAPARE - Administrator, Civil Service (Medical): No    Lack of Transportation (Non-Medical): No  Physical Activity: Not on file  Stress: Not on file  Social Connections: Not on file  Intimate Partner Violence: Not on file    Physical Exam      Future Appointments  Date Time Provider Department Center  02/11/2023  9:30 AM [facility] None  02/12/2023  9:30 AM Weston Settle, MD [facility] None  02/26/2023  9:15 AM McCaughan, Dia D, DPM [facility]  03/21/2023  9:00 AM [facility] PA/NP [facility] None  03/24/2023  3:40 PM [facility] DEVICE REMOTES [facility]  06/23/2023  3:40 PM [facility] DEVICE REMOTES [facility]  09/22/2023  3:40 PM [facility] DEVICE REMOTES [facility]

## 2023-01-15 ENCOUNTER — Telehealth (HOSPITAL_COMMUNITY): Payer: Self-pay

## 2023-01-15 DIAGNOSIS — I2510 Atherosclerotic heart disease of native coronary artery without angina pectoris: Secondary | ICD-10-CM

## 2023-01-15 DIAGNOSIS — E7849 Other hyperlipidemia: Secondary | ICD-10-CM

## 2023-01-15 MED ORDER — REPATHA SURECLICK 140 MG/ML ~~LOC~~ SOAJ: SUBCUTANEOUS | 3 refills | Status: DC

## 2023-01-15 NOTE — Telephone Encounter (Signed)
Spoke to [employer] and they advise they need a new RX for Tommy Ward Repatha. He has been out of same X1 week. Please send to [name] under grant. Thanks.   [name], EMT-Paramedic [phone] 01/15/2023

## 2023-01-15 NOTE — Telephone Encounter (Signed)
Rx(s) sent to pharmacy electronically.  

## 2023-01-15 NOTE — Addendum Note (Signed)
Addended by: Lindell Spar on: 01/15/2023 04:59 PM   Modules accepted: Orders

## 2023-01-22 NOTE — Progress Notes (Signed)
Remote ICD transmission.   

## 2023-01-27 ENCOUNTER — Inpatient Hospital Stay: Payer: Medicare Other | Attending: Oncology

## 2023-01-27 DIAGNOSIS — Z79899 Other long term (current) drug therapy: Secondary | ICD-10-CM | POA: Insufficient documentation

## 2023-01-27 DIAGNOSIS — C61 Malignant neoplasm of prostate: Secondary | ICD-10-CM | POA: Diagnosis present

## 2023-01-27 LAB — PSA: Prostatic Specific Antigen: 0.02 ng/mL (ref 0.00–4.00)

## 2023-01-27 LAB — CMP (CANCER CENTER ONLY)
ALT: 12 U/L (ref 0–44)
AST: 15 U/L (ref 15–41)
Albumin: 3.8 g/dL (ref 3.5–5.0)
Alkaline Phosphatase: 63 U/L (ref 38–126)
Anion gap: 12 (ref 5–15)
BUN: 40 mg/dL — ABNORMAL HIGH (ref 8–23)
CO2: 30 mmol/L (ref 22–32)
Calcium: 9.5 mg/dL (ref 8.9–10.3)
Chloride: 96 mmol/L — ABNORMAL LOW (ref 98–111)
Creatinine: 2.19 mg/dL — ABNORMAL HIGH (ref 0.61–1.24)
GFR, Estimated: 30 mL/min — ABNORMAL LOW (ref >60)
Glucose, Bld: 120 mg/dL — ABNORMAL HIGH (ref 70–99)
Potassium: 3.9 mmol/L (ref 3.5–5.1)
Sodium: 138 mmol/L (ref 135–145)
Total Bilirubin: 0.7 mg/dL (ref 0.3–1.2)
Total Protein: 6.7 g/dL (ref 6.5–8.1)

## 2023-01-27 LAB — CBC WITH DIFFERENTIAL (CANCER CENTER ONLY)
Abs Immature Granulocytes: 0.02 10*3/uL (ref 0.00–0.07)
Basophils Absolute: 0.1 10*3/uL (ref 0.0–0.1)
Basophils Relative: 1 %
Eosinophils Absolute: 0.1 10*3/uL (ref 0.0–0.5)
Eosinophils Relative: 1 %
HCT: 42.8 % (ref 39.0–52.0)
Hemoglobin: 13.8 g/dL (ref 13.0–17.0)
Immature Granulocytes: 0 %
Lymphocytes Relative: 28 %
Lymphs Abs: 1.5 10*3/uL (ref 0.7–4.0)
MCH: 29.2 pg (ref 26.0–34.0)
MCHC: 32.2 g/dL (ref 30.0–36.0)
MCV: 90.7 fL (ref 80.0–100.0)
Monocytes Absolute: 0.5 10*3/uL (ref 0.1–1.0)
Monocytes Relative: 9 %
Neutro Abs: 3.3 10*3/uL (ref 1.7–7.7)
Neutrophils Relative %: 61 %
Platelet Count: 228 10*3/uL (ref 150–400)
RBC: 4.72 MIL/uL (ref 4.22–5.81)
RDW: 14.8 % (ref 11.5–15.5)
WBC Count: 5.5 10*3/uL (ref 4.0–10.5)
nRBC: 0 % (ref 0.0–0.2)

## 2023-01-29 ENCOUNTER — Other Ambulatory Visit (HOSPITAL_COMMUNITY): Payer: Self-pay

## 2023-01-29 ENCOUNTER — Telehealth (HOSPITAL_COMMUNITY): Payer: Self-pay

## 2023-01-29 NOTE — Telephone Encounter (Signed)
Othman reports he recently traveled and went through security several times forgetting that he should not go through security monitors. He wanted me to make device clinic aware.   [name], EMT-Paramedic [phone] 01/29/2023

## 2023-01-29 NOTE — Progress Notes (Signed)
Paramedicine Encounter    Patient ID: Tommy Ward, male    DOB: Feb 04, 1941, 82 y.o.   MRN: [id number]   Complaints- NONE   Assessment- CAOx4, warm and dry, ambulatory with no shortness of breath, no swelling, lungs clear, no chest pain, no dizziness. Vitals obtained.   Compliance with meds- missed two nighttime doses   Pill box filled- for one week   Refills needed- Repatha   Meds changes since last visit- NONE     Social changes- NONE   Arrived for home visit for [facility] who is ambulatory on my arrival with no shortness of breath who reports to be feeling well. He stated he has had no chest pain, shortness of breath or swelling over the last two weeks. He was out of town visiting family in [place]. [place] and traveled by plane. He admits walking through the airport security body scanners while having an implanted ICD. I messaged the device clinic making them aware of same and sent a remote transmission. Vitals obtained as noted. Meds reviewed and confirmed filling pill box for one week. He [facility] not [facility]- I will follow up on same. It has been almost one month that he has been without same. No social concerns. Appointments reviewed and confirmed same. Home visit complete. I will see [place] in one week.    BP 122/72   Pulse 72   Resp 16   Wt 168 lb (76.2 kg)   SpO2 100%   BMI 24.81 kg/m  Weight yesterday-- didn't weigh  Last visit weight-- 170lbs   [name], EMT-Paramedic [phone]  ACTION: Home visit completed    Patient Care Team: Buckner Malta, MD as PCP - General (Family Medicine) Rennis Golden Lisette Abu, MD as PCP - Cardiology (Cardiology) Lanier Prude, MD as PCP - Electrophysiology (Cardiology) Weston Settle, MD as Consulting Physician (Oncology) Dora Sims, DDS as Referring Physician (Oral Surgery) Debroah Baller, MD as Consulting Physician (Urology)  Patient Active Problem List   Diagnosis Date Noted    Vitamin D toxicity 07/24/2022   Anemia due to stage 4 chronic kidney disease (HCC) 03/20/2022   Hypercalcemia of malignancy 02/16/2021   Chronic systolic heart failure (HCC)    Abnormal stress test 12/05/2020   Depressed left ventricular ejection fraction 12/05/2020   Nonrheumatic mitral valve regurgitation 12/05/2020   Pre-diabetes 12/05/2020   Pulmonary hypertension (HCC) 12/05/2020   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HCC) 12/05/2020   Tobacco use 12/05/2020   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   HFrEF (heart failure with reduced ejection fraction) (HCC) 11/23/2020   CAD (coronary artery disease) 11/23/2020   GERD (gastroesophageal reflux disease)    Hyperlipidemia    Hypertension    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   Malignant neoplasm of prostate (HCC) 06/02/2020   AKI (acute kidney injury) (HCC) 11/25/2019   Hyperphosphatemia 11/25/2019   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   Hyperkalemia 08/24/2019   Metabolic bone disease 08/24/2019   Vitamin D deficiency 08/24/2019   Benign hypertension with chronic kidney disease, stage III (HCC) 05/20/2019   Decreased cardiac ejection fraction 05/20/2019   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) 05/20/2019   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 02/24/2019   Olecranon bursitis of left elbow 06/25/2018   B12 deficiency 03/22/2018   Gastroesophageal reflux disease without esophagitis 03/22/2018   Chronic  renal insufficiency, stage III (moderate) (HCC) 11/27/2015   Aortic valve disorder 07/08/2014   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   Cataract 11/2013   Essential hypertension 07/06/2012   Coronary arteriosclerosis 07/06/2012   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   Left bundle branch block 07/06/2012   Mixed  hyperlipidemia 07/06/2012   Cancer (HCC) 08/2008   S/P radiation therapy > 12 wks ago 2010    Current Outpatient Medications:    aspirin EC 81 MG tablet, Take 1 tablet (81 mg total) by mouth daily., Disp: 90 tablet, Rfl: 3   Cetirizine HCl 10 MG CAPS, Take 1 capsule by mouth daily., Disp: , Rfl:    cholestyramine light (PREVALITE) 4 g packet, Take 4 g by mouth 2 (two) times daily., Disp: , Rfl:    dapagliflozin propanediol (FARXIGA) 10 MG TABS tablet, Take 1 tablet (10 mg total) by mouth daily before breakfast. Pharmacy note: please split bill Farxiga to [facility] RX BIN: [id number] RX PCN: [id number] RX GROUP: [id number] RX ID: [id number], Disp: 90 tablet, Rfl: 3   digoxin (LANOXIN) 0.125 MG tablet, Take 1 tablet (0.125 mg total) by mouth every other day., Disp: 45 tablet, Rfl: 3   enzalutamide (XTANDI) 40 MG tablet, Take 160 mg by mouth daily., Disp: , Rfl:    Evolocumab (REPATHA SURECLICK) 140 MG/ML SOAJ, INJECT 1 DOSE UNDER THE SKIN EVERY 14 DAYS, Disp: 6 mL, Rfl: 3   metoprolol succinate (TOPROL XL) 25 MG 24 hr tablet, Take 1 tablet (25 mg total) by mouth at bedtime., Disp: 90 tablet, Rfl: 3   Multiple Vitamins-Minerals (PRESERVISION/LUTEIN) CAPS, Take 1 capsule by mouth at bedtime., Disp: , Rfl:    omeprazole (PRILOSEC) 40 MG capsule, Take 40 mg by mouth daily., Disp: , Rfl:    pregabalin (LYRICA) 100 MG capsule, Take 100 mg by mouth 3 (three) times daily., Disp: , Rfl:    rosuvastatin (CRESTOR) 5 MG tablet, Take 5 mg by mouth daily., Disp: , Rfl:    spironolactone (ALDACTONE) 25 MG tablet, Take 1 tablet (25 mg total) by mouth daily., Disp: 90 tablet, Rfl: 1   tamsulosin (FLOMAX) 0.4 MG CAPS capsule, Take 1 capsule (0.4 mg total) by mouth daily., Disp: 30 capsule, Rfl: 0   torsemide (DEMADEX) 20 MG tablet, Alternate 60 mg with 40 mg, Disp: 180 tablet, Rfl: 3   traZODone (DESYREL) 100 MG tablet, Take 50 mg by mouth at bedtime., Disp: , Rfl:  Allergies  Allergen Reactions   Ezetimibe Other (See  Comments)    Myalgia   Gabapentin Itching and Other (See Comments)    Sore throat, breakouts   Statins Other (See Comments)    Myalgias (intolerance)     Social History   Socioeconomic History   Marital status: Married    Spouse name: Not on file   Number of children: Not on file   Years of education: Not on file   Highest education level: Not on file  Occupational History   Not on file  Tobacco Use   Smoking status: Every Day    Packs/day: 0.50    Years: 50.00    Additional pack years: 0.00    Total pack years: 25.00    Types: Cigarettes   Smokeless tobacco: Never  Vaping Use   Vaping Use: Never used  Substance and Sexual Activity   Alcohol use: Not Currently   Drug use: Never   Sexual activity: Not on file  Other Topics Concern   Not on file  Social History Narrative   Not on file   Social Determinants of Health   Financial Resource Strain: Low Risk  (05/22/2022)   Overall Financial Resource Strain (CARDIA)    Difficulty of Paying Living Expenses: Not very hard  Food Insecurity: No Food Insecurity (05/22/2022)   Hunger Vital Sign    Worried About Running Out of Food in the Last Year: Never true    Ran Out of Food in the Last Year: Never true  Transportation Needs: No Transportation Needs (05/22/2022)   PRAPARE - Administrator, Civil Service (Medical): No    Lack of Transportation (Non-Medical): No  Physical Activity: Not on file  Stress: Not on file  Social Connections: Not on file  Intimate Partner Violence: Not on file    Physical Exam      Future Appointments  Date Time Provider Department Center  02/12/2023  9:30 AM Weston Settle, MD [facility] None  02/26/2023  9:15 AM McCaughan, Dia D, DPM [facility]  03/21/2023  9:00 AM [facility] PA/NP [facility] None  03/24/2023  3:40 PM [facility] DEVICE REMOTES [facility]  06/23/2023  3:40 PM [facility] DEVICE REMOTES [facility]  09/22/2023  3:40 PM [facility]  DEVICE REMOTES [facility]

## 2023-01-30 ENCOUNTER — Telehealth (HOSPITAL_COMMUNITY): Payer: Self-pay

## 2023-01-30 NOTE — Telephone Encounter (Signed)
Called [name] to confirm shipment of Repatha- they report they have tried to reach Tommy Ward to set up delivery with no success. I set up this shipment for him today and it will be delivered on Tuesday. I called and informed the patient and his wife of same.   [name], EMT-Paramedic [phone] 01/30/2023

## 2023-02-03 NOTE — Telephone Encounter (Addendum)
Spoke with patient's spouse. She is going to assist him with sending a remote transmission when she gets home today. They want to ensure remote monitor is transmitting and there has been no change in the ICD device with him having gone through security airport scanners multiple times.

## 2023-02-04 NOTE — Telephone Encounter (Signed)
LVM with information that device is working properly, there were no abnormalities

## 2023-02-04 NOTE — Telephone Encounter (Signed)
Spoke with [name] EMT-Paramedicine informed her that his transmission was normal, going through the security did not affect his device, she appreciative of call back, and would let patient know when she went out to see him tomorrow.

## 2023-02-05 ENCOUNTER — Telehealth (HOSPITAL_COMMUNITY): Payer: Self-pay

## 2023-02-05 ENCOUNTER — Other Ambulatory Visit (HOSPITAL_COMMUNITY): Payer: Self-pay

## 2023-02-05 NOTE — Telephone Encounter (Signed)
Saw Tommy Ward in the home today. He has yet to receive his Repatha from [name] after they reported last week they would ship it out yesterday. I called today and they report that insurance is rejecting it and the [facility] grant and that it may need a prior auth from [place]. I will send message to RN at Dr. Andrez Grime office for further.   [name], EMT-Paramedic [phone] 02/05/2023

## 2023-02-05 NOTE — Progress Notes (Signed)
Paramedicine Encounter    Patient ID: Tommy Ward, male    DOB: 06-22-1941, 82 y.o.   MRN: [id number]   Complaints- none   Assessment- CAOx4, warm and dry, lungs clear, no swelling, no complaints of dizziness, no chest pain, no shortness of breath. No edema noted.   Compliance with meds- no missed doses over the last week  Pill box filled- for one week   Refills needed- Repatha(pending possible prior auth from Dr. Rennis Golden per [name])   Meds changes since last visit- none     Social changes- none   Arrived for home visit for [name] who reports to be feeling well with no complaints. He reports he has had no chest pain, swelling, dizziness or shortness of breath. We obtained vitals as noted, lungs clear. Meds reviewed and confirmed and pill box filled for one week. He has yet to receive his Repatha from [name] after they reported last week they would ship it out- I called today and they report that insurance is rejecting it and the [employer] grant and that it may need a prior auth from MD. I will send message to RN at Dr. Andrez Grime office. He agreed with same. I filled pill box for one week. No other refills needed. We reviewed upcoming appointments. HF education provided. Home visit complete. I will see Tommy Ward in one week.   BP 108/62   Pulse 69   Resp 16   Wt 170 lb (77.1 kg)   SpO2 100%   BMI 25.10 kg/m  Weight yesterday-- didn't weigh Last visit weight--168lbs    Tommy Ward, EMT-Paramedic [phone]  ACTION: Home visit completed    Patient Care Team: Buckner Malta, MD as PCP - General (Family Medicine) Rennis Golden Lisette Abu, MD as PCP - Cardiology (Cardiology) Lanier Prude, MD as PCP - Electrophysiology (Cardiology) Weston Settle, MD as Consulting Physician (Oncology) Dora Sims, DDS as Referring Physician (Oral Surgery) Debroah Baller, MD as Consulting Physician (Urology)  Patient Active Problem List   Diagnosis Date Noted   Vitamin D  toxicity 07/24/2022   Anemia due to stage 4 chronic kidney disease (HCC) 03/20/2022   Hypercalcemia of malignancy 02/16/2021   Chronic systolic heart failure (HCC)    Abnormal stress test 12/05/2020   Depressed left ventricular ejection fraction 12/05/2020   Nonrheumatic mitral valve regurgitation 12/05/2020   Pre-diabetes 12/05/2020   Pulmonary hypertension (HCC) 12/05/2020   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HCC) 12/05/2020   Tobacco use 12/05/2020   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   HFrEF (heart failure with reduced ejection fraction) (HCC) 11/23/2020   CAD (coronary artery disease) 11/23/2020   GERD (gastroesophageal reflux disease)    Hyperlipidemia    Hypertension    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   Malignant neoplasm of prostate (HCC) 06/02/2020   AKI (acute kidney injury) (HCC) 11/25/2019   Hyperphosphatemia 11/25/2019   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   Hyperkalemia 08/24/2019   Metabolic bone disease 08/24/2019   Vitamin D deficiency 08/24/2019   Benign hypertension with chronic kidney disease, stage III (HCC) 05/20/2019   Decreased cardiac ejection fraction 05/20/2019   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) 05/20/2019   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 02/24/2019   Olecranon bursitis of left elbow 06/25/2018   B12 deficiency 03/22/2018   Gastroesophageal reflux disease without esophagitis 03/22/2018   Chronic  renal insufficiency, stage III (moderate) (HCC) 11/27/2015   Aortic valve disorder 07/08/2014   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   Cataract 11/2013   Essential hypertension 07/06/2012   Coronary arteriosclerosis 07/06/2012   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   Left bundle branch block 07/06/2012   Mixed  hyperlipidemia 07/06/2012   Cancer (HCC) 08/2008   S/P radiation therapy > 12 wks ago 2010    Current Outpatient Medications:    aspirin EC 81 MG tablet, Take 1 tablet (81 mg total) by mouth daily., Disp: 90 tablet, Rfl: 3   Cetirizine HCl 10 MG CAPS, Take 1 capsule by mouth daily., Disp: , Rfl:    cholestyramine light (PREVALITE) 4 g packet, Take 4 g by mouth 2 (two) times daily., Disp: , Rfl:    dapagliflozin propanediol (FARXIGA) 10 MG TABS tablet, Take 1 tablet (10 mg total) by mouth daily before breakfast. Pharmacy note: please split bill Farxiga to [facility] BIN: [id number] RX PCN: [id number] RX GROUP: [id number] RX ID: [id number], Disp: 90 tablet, Rfl: 3   digoxin (LANOXIN) 0.125 MG tablet, Take 1 tablet (0.125 mg total) by mouth every other day., Disp: 45 tablet, Rfl: 3   enzalutamide (XTANDI) 40 MG tablet, Take 160 mg by mouth daily., Disp: , Rfl:    metoprolol succinate (TOPROL XL) 25 MG 24 hr tablet, Take 1 tablet (25 mg total) by mouth at bedtime., Disp: 90 tablet, Rfl: 3   Multiple Vitamins-Minerals (PRESERVISION/LUTEIN) CAPS, Take 1 capsule by mouth at bedtime., Disp: , Rfl:    omeprazole (PRILOSEC) 40 MG capsule, Take 40 mg by mouth daily., Disp: , Rfl:    pregabalin (LYRICA) 100 MG capsule, Take 100 mg by mouth 3 (three) times daily., Disp: , Rfl:    rosuvastatin (CRESTOR) 5 MG tablet, Take 5 mg by mouth daily., Disp: , Rfl:    spironolactone (ALDACTONE) 25 MG tablet, Take 1 tablet (25 mg total) by mouth daily., Disp: 90 tablet, Rfl: 1   tamsulosin (FLOMAX) 0.4 MG CAPS capsule, Take 1 capsule (0.4 mg total) by mouth daily., Disp: 30 capsule, Rfl: 0   torsemide (DEMADEX) 20 MG tablet, Alternate 60 mg with 40 mg, Disp: 180 tablet, Rfl: 3   traZODone (DESYREL) 100 MG tablet, Take 50 mg by mouth at bedtime., Disp: , Rfl:    Evolocumab (REPATHA SURECLICK) 140 MG/ML SOAJ, INJECT 1 DOSE UNDER THE SKIN EVERY 14 DAYS, Disp: 6 mL, Rfl: 3 Allergies  Allergen Reactions   Ezetimibe Other (See  Comments)    Myalgia   Gabapentin Itching and Other (See Comments)    Sore throat, breakouts   Statins Other (See Comments)    Myalgias (intolerance)     Social History   Socioeconomic History   Marital status: Married    Spouse name: Not on file   Number of children: Not on file   Years of education: Not on file   Highest education level: Not on file  Occupational History   Not on file  Tobacco Use   Smoking status: Every Day    Packs/day: 0.50    Years: 50.00    Additional pack years: 0.00    Total pack years: 25.00    Types: Cigarettes   Smokeless tobacco: Never  Vaping Use   Vaping Use: Never used  Substance and Sexual Activity   Alcohol use: Not Currently   Drug use: Never   Sexual activity: Not on file  Other Topics Concern   Not on file  Social History Narrative   Not on file   Social Determinants of Health   Financial Resource Strain: Low Risk  (05/22/2022)   Overall Financial Resource Strain (CARDIA)    Difficulty of Paying Living Expenses: Not very hard  Food Insecurity: No Food Insecurity (05/22/2022)   Hunger Vital Sign    Worried About Running Out of Food in the Last Year: Never true    Ran Out of Food in the Last Year: Never true  Transportation Needs: No Transportation Needs (05/22/2022)   PRAPARE - Administrator, Civil Service (Medical): No    Lack of Transportation (Non-Medical): No  Physical Activity: Not on file  Stress: Not on file  Social Connections: Not on file  Intimate Partner Violence: Not on file    Physical Exam      Future Appointments  Date Time Provider Department Center  02/12/2023  9:30 AM Weston Settle, MD [facility] None  02/26/2023  9:15 AM McCaughan, Dia D, DPM [facility]  03/21/2023  9:00 AM [facility] PA/NP [facility] None  03/24/2023  3:40 PM [facility] DEVICE REMOTES [facility]  06/23/2023  3:40 PM [facility] DEVICE REMOTES [facility]  09/22/2023  3:40 PM [facility]  DEVICE REMOTES [facility]

## 2023-02-07 NOTE — Telephone Encounter (Signed)
PA is active [employer] approval is active  Message sent to patient via MyChart w/information

## 2023-02-11 ENCOUNTER — Other Ambulatory Visit: Payer: Medicare Other

## 2023-02-11 ENCOUNTER — Encounter: Payer: Self-pay | Admitting: Oncology

## 2023-02-11 NOTE — Progress Notes (Unsigned)
[facility]  [address] [place],  [place]  [address] [phone]  Clinic Day:  02/12/2023  Referring physician: Buckner Malta, MD   HISTORY OF PRESENT ILLNESS:  The patient is an 82 y.o. 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  He is supposed to be on Trelstar as well, but has not had a Trelstar shot at his urologist's office in numerous months.  He comes in today for routine followup.  Since his last visit, the patient has been doing well.  He denies having any systemic symptoms which concern him for overt progression of his metastatic prostate cancer.  PHYSICAL EXAM:  Blood pressure (!) 112/56, pulse 69, temperature 98.6 F (37 C), resp. rate 16, height 5\' 9"  (1.753 m), weight 170 lb 4.8 oz (77.2 kg), SpO2 95 %. Wt Readings from Last 3 Encounters:  02/12/23 170 lb 4.8 oz (77.2 kg)  02/05/23 170 lb (77.1 kg)  01/29/23 168 lb (76.2 kg)   Body mass index is 25.15 kg/m. Performance status (ECOG): 1 - Symptomatic but completely ambulatory Physical Exam Constitutional:      Appearance: Normal appearance. He is not ill-appearing.  HENT:     Mouth/Throat:     Mouth: Mucous membranes are moist.     Pharynx: Oropharynx is clear. No oropharyngeal exudate or posterior oropharyngeal erythema.  Cardiovascular:     Rate and Rhythm: Normal rate and regular rhythm.     Heart sounds: No murmur heard.    No friction rub. No gallop.  Pulmonary:     Effort: Pulmonary effort is normal. No respiratory distress.     Breath sounds: Normal breath sounds. No wheezing, rhonchi or rales.  Abdominal:     General: Bowel sounds are normal. There is no distension.     Palpations: Abdomen is soft. There is no mass.     Tenderness: There is no abdominal tenderness.  Musculoskeletal:        General: No swelling.     Right lower leg: No edema.     Left lower leg: No edema.  Lymphadenopathy:     Cervical: No cervical  adenopathy.     Upper Body:     Right upper body: No supraclavicular or axillary adenopathy.     Left upper body: No supraclavicular or axillary adenopathy.     Lower Body: No right inguinal adenopathy. No left inguinal adenopathy.  Skin:    General: Skin is warm.     Coloration: Skin is not jaundiced.     Findings: No lesion or rash.  Neurological:     General: No focal deficit present.     Mental Status: He is alert and oriented to person, place, and time. Mental status is at baseline.  Psychiatric:        Mood and Affect: Mood normal.        Behavior: Behavior normal.        Thought Content: Thought content normal.    LABS:    Latest Reference Range & Units 10/11/22 09:14 01/27/23 10:30  Prostatic Specific Antigen 0.00 - 4.00 ng/mL 0.02 0.02   ASSESSMENT & PLAN:  Assessment/Plan:  An 82 y.o. male with metastatic prostate cancer.  I am very pleased as his PSA remains at an undetectable level (<.1).  For now, he will continue taking his enzalutamide daily as it appears to be effective as a single agent in controlling his disease.  Clinically, the patient is doing well.  I  will see him back in 4 months for repeat clinical assessment.  The patient understands all the plans discussed today and is in agreement with them.    Tarry Fountain Macarthur Critchley, MD

## 2023-02-12 ENCOUNTER — Inpatient Hospital Stay (INDEPENDENT_AMBULATORY_CARE_PROVIDER_SITE_OTHER): Payer: Medicare Other | Admitting: Oncology

## 2023-02-12 ENCOUNTER — Other Ambulatory Visit (HOSPITAL_COMMUNITY): Payer: Self-pay

## 2023-02-12 ENCOUNTER — Other Ambulatory Visit: Payer: Self-pay | Admitting: Oncology

## 2023-02-12 ENCOUNTER — Telehealth: Payer: Self-pay | Admitting: Oncology

## 2023-02-12 VITALS — BP 112/56 | HR 69 | Temp 98.6°F | Resp 16 | Ht 69.0 in | Wt 170.3 lb

## 2023-02-12 DIAGNOSIS — C61 Malignant neoplasm of prostate: Secondary | ICD-10-CM

## 2023-02-12 NOTE — Telephone Encounter (Signed)
02/12/23 Spoke with patients wife and scheduled next appt

## 2023-02-13 NOTE — Progress Notes (Signed)
Paramedicine Encounter    Patient ID: Tommy Ward, male    DOB: [birth year], 82 y.o.   MRN: [id number]  Met with Harim in the home today to review his medications and fill pill box. No assessment or vitals taken due to him being seen by oncology earlier today. He had no complaints. Meds reviewed and pill box filled. We talked to [facility] to get his Repatha prior Berkley Harvey confirmed and they report they will reach out to pharmacy to see the issue. They spoke with [name] and stated the pharmacy was processing order and it should be shipped out within the week. We reviewed upcoming appointments. No other needs at this time. I will follow up in one week. Home visit complete.   [name], EMT-Paramedic [phone] 02/13/2023       Patient Care Team: Buckner Malta, MD as PCP - General (Family Medicine) Rennis Golden Lisette Abu, MD as PCP - Cardiology (Cardiology) Lanier Prude, MD as PCP - Electrophysiology (Cardiology) Weston Settle, MD as Consulting Physician (Oncology) Dora Sims, DDS as Referring Physician (Oral Surgery) Debroah Baller, MD as Consulting Physician (Urology)  Patient Active Problem List   Diagnosis Date Noted   Vitamin D toxicity 07/24/2022   Anemia due to stage 4 chronic kidney disease (HCC) 03/20/2022   Hypercalcemia of malignancy 02/16/2021   Chronic systolic heart failure (HCC)    Abnormal stress test 12/05/2020   Depressed left ventricular ejection fraction 12/05/2020   Nonrheumatic mitral valve regurgitation 12/05/2020   Pre-diabetes 12/05/2020   Pulmonary hypertension (HCC) 12/05/2020   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HCC) 12/05/2020   Tobacco use 12/05/2020   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   HFrEF (heart failure with reduced ejection fraction) (HCC) 11/23/2020   CAD (coronary artery disease) 11/23/2020   GERD (gastroesophageal reflux disease)    Hyperlipidemia     Hypertension    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   Malignant neoplasm of prostate (HCC) 06/02/2020   AKI (acute kidney injury) (HCC) 11/25/2019   Hyperphosphatemia 11/25/2019   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   Hyperkalemia 08/24/2019   Metabolic bone disease 08/24/2019   Vitamin D deficiency 08/24/2019   Benign hypertension with chronic kidney disease, stage III (HCC) 05/20/2019   Decreased cardiac ejection fraction 05/20/2019   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) 05/20/2019   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 02/24/2019   Olecranon bursitis of left elbow 06/25/2018   B12 deficiency 03/22/2018   Gastroesophageal reflux disease without esophagitis 03/22/2018   Chronic renal insufficiency, stage III (moderate) (HCC) 11/27/2015   Aortic valve disorder 07/08/2014   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   Cataract 11/2013   Essential hypertension 07/06/2012   Coronary arteriosclerosis 07/06/2012   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   Left bundle branch block 07/06/2012   Mixed hyperlipidemia 07/06/2012   Cancer (HCC) 08/2008   S/P radiation therapy > 12 wks ago 2010    Current Outpatient Medications:    aspirin EC 81 MG tablet, Take 1 tablet (81 mg total) by mouth daily., Disp: 90 tablet, Rfl: 3   Cetirizine HCl 10 MG CAPS, Take 1 capsule by mouth daily., Disp: , Rfl:    cholestyramine light (PREVALITE) 4 g packet, Take 4 g by mouth 2 (two) times daily., Disp: , Rfl:    dapagliflozin propanediol (FARXIGA) 10 MG  TABS tablet, Take 1 tablet (10 mg total) by mouth daily before breakfast. Pharmacy note: please split bill Farxiga to [facility] BIN: [id number] RX PCN: [id number] RX GROUP: [id number] RX ID: [id number], Disp: 90 tablet, Rfl: 3   digoxin (LANOXIN) 0.125 MG tablet, Take 1 tablet (0.125 mg total) by mouth  every other day., Disp: 45 tablet, Rfl: 3   enzalutamide (XTANDI) 40 MG tablet, Take 160 mg by mouth daily., Disp: , Rfl:    Evolocumab (REPATHA SURECLICK) 140 MG/ML SOAJ, INJECT 1 DOSE UNDER THE SKIN EVERY 14 DAYS, Disp: 6 mL, Rfl: 3   metoprolol succinate (TOPROL XL) 25 MG 24 hr tablet, Take 1 tablet (25 mg total) by mouth at bedtime., Disp: 90 tablet, Rfl: 3   Multiple Vitamins-Minerals (PRESERVISION/LUTEIN) CAPS, Take 1 capsule by mouth at bedtime., Disp: , Rfl:    omeprazole (PRILOSEC) 40 MG capsule, Take 40 mg by mouth daily., Disp: , Rfl:    pregabalin (LYRICA) 100 MG capsule, Take 100 mg by mouth 3 (three) times daily., Disp: , Rfl:    rosuvastatin (CRESTOR) 5 MG tablet, Take 5 mg by mouth daily., Disp: , Rfl:    spironolactone (ALDACTONE) 25 MG tablet, Take 1 tablet (25 mg total) by mouth daily., Disp: 90 tablet, Rfl: 1   tamsulosin (FLOMAX) 0.4 MG CAPS capsule, Take 1 capsule (0.4 mg total) by mouth daily., Disp: 30 capsule, Rfl: 0   torsemide (DEMADEX) 20 MG tablet, Alternate 60 mg with 40 mg, Disp: 180 tablet, Rfl: 3   traZODone (DESYREL) 100 MG tablet, Take 50 mg by mouth at bedtime., Disp: , Rfl:  Allergies  Allergen Reactions   Ezetimibe Other (See Comments)    Myalgia   Gabapentin Itching and Other (See Comments)    Sore throat, breakouts   Statins Other (See Comments)    Myalgias (intolerance)     Social History   Socioeconomic History   Marital status: Married    Spouse name: Not on file   Number of children: Not on file   Years of education: Not on file   Highest education level: Not on file  Occupational History   Not on file  Tobacco Use   Smoking status: Every Day    Packs/day: 0.50    Years: 50.00    Additional pack years: 0.00    Total pack years: 25.00    Types: Cigarettes   Smokeless tobacco: Never  Vaping Use   Vaping Use: Never used  Substance and Sexual Activity   Alcohol use: Not Currently   Drug use: Never   Sexual activity: Not on file   Other Topics Concern   Not on file  Social History Narrative   Not on file   Social Determinants of Health   Financial Resource Strain: Low Risk  (05/22/2022)   Overall Financial Resource Strain (CARDIA)    Difficulty of Paying Living Expenses: Not very hard  Food Insecurity: No Food Insecurity (05/22/2022)   Hunger Vital Sign    Worried About Running Out of Food in the Last Year: Never true    Ran Out of Food in the Last Year: Never true  Transportation Needs: No Transportation Needs (05/22/2022)   PRAPARE - Administrator, Civil Service (Medical): No    Lack of Transportation (Non-Medical): No  Physical Activity: Not on file  Stress: Not on file  Social Connections: Not on file  Intimate Partner Violence: Not on file    Physical Exam  Future Appointments  Date Time Provider Department Center  02/26/2023  9:15 AM Clerance Lav, DPM [facility]  03/21/2023  9:00 AM [facility] PA/NP [facility] None  03/24/2023  3:40 PM [facility] DEVICE REMOTES [facility]  06/12/2023  9:30 AM [facility] None  06/13/2023  9:30 AM Weston Settle, MD [facility] None  06/23/2023  3:40 PM [facility] DEVICE REMOTES [facility]  09/22/2023  3:40 PM [facility] DEVICE REMOTES [facility]     ACTION: Home visit completed

## 2023-02-19 ENCOUNTER — Other Ambulatory Visit (HOSPITAL_COMMUNITY): Payer: Self-pay

## 2023-02-19 NOTE — Progress Notes (Signed)
Paramedicine Encounter    Patient ID: Tommy Ward, male    DOB: [birth year], 82 y.o.   MRN: [id number]   Complaints- chronic neuropathy, no shortness of breath, dizziness, chest pain, swelling.   Assessment- CAOX4, warm and dry ambulatory, no shortness of breath, no dizziness, no chest pain, no swelling, no weight gain. Lungs clear. Vitals obtained as noted.   Compliance with meds- No missed doses   Pill box filled- one week   Refills needed- xtandi, repatha (will be delivered Tuesday)  Meds changes since last visit- none     Social changes- none    BP 110/72   Pulse 70   Resp 16   Wt 170 lb (77.1 kg)   SpO2 97%   BMI 25.10 kg/m  Weight yesterday-- didn't weigh  Last visit weight-- 170lbs    Arrived for home visit for [facility] who was ambulatory with no shortness of breath, no complaints other than his chronic neuropathy pain. Vitals and assessment obtained. Meds reviewed. Pill box filled. I called [name] to see why his repatha had not yet been delivered and they report they had tried to reach the patient to set up delivery without success. I set up same and the medication will be delivered on Tuesday. No other needs at present. I will see [name] in one week.    [name], EMT-Paramedic [phone]  ACTION: Home visit completed    Patient Care Team: Buckner Malta, MD as PCP - General (Family Medicine) Rennis Golden Lisette Abu, MD as PCP - Cardiology (Cardiology) Lanier Prude, MD as PCP - Electrophysiology (Cardiology) Weston Settle, MD as Consulting Physician (Oncology) Dora Sims, DDS as Referring Physician (Oral Surgery) Debroah Baller, MD as Consulting Physician (Urology)  Patient Active Problem List   Diagnosis Date Noted   Vitamin D toxicity 07/24/2022   Anemia due to stage 4 chronic kidney disease (HCC) 03/20/2022   Hypercalcemia of malignancy 02/16/2021   Chronic systolic heart failure (HCC)    Abnormal stress test 12/05/2020    Depressed left ventricular ejection fraction 12/05/2020   Nonrheumatic mitral valve regurgitation 12/05/2020   Pre-diabetes 12/05/2020   Pulmonary hypertension (HCC) 12/05/2020   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HCC) 12/05/2020   Tobacco use 12/05/2020   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   HFrEF (heart failure with reduced ejection fraction) (HCC) 11/23/2020   CAD (coronary artery disease) 11/23/2020   GERD (gastroesophageal reflux disease)    Hyperlipidemia    Hypertension    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   Malignant neoplasm of prostate (HCC) 06/02/2020   AKI (acute kidney injury) (HCC) 11/25/2019   Hyperphosphatemia 11/25/2019   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   Hyperkalemia 08/24/2019   Metabolic bone disease 08/24/2019   Vitamin D deficiency 08/24/2019   Benign hypertension with chronic kidney disease, stage III (HCC) 05/20/2019   Decreased cardiac ejection fraction 05/20/2019   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) 05/20/2019   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 02/24/2019   Olecranon bursitis of left elbow 06/25/2018   B12 deficiency 03/22/2018   Gastroesophageal reflux disease without esophagitis 03/22/2018   Chronic renal insufficiency, stage III (moderate) (HCC) 11/27/2015   Aortic valve disorder 07/08/2014   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   Cataract 11/2013   Essential hypertension 07/06/2012   Coronary arteriosclerosis 07/06/2012   Hypertensive heart disease without congestive heart  failure 07/06/2012   Left bundle branch block 07/06/2012   Mixed hyperlipidemia 07/06/2012   Cancer (HCC) 08/2008   S/P radiation therapy > 12 wks ago 2010    Current Outpatient Medications:    aspirin EC 81 MG tablet, Take 1 tablet (81 mg total) by mouth daily., Disp: 90  tablet, Rfl: 3   Cetirizine HCl 10 MG CAPS, Take 1 capsule by mouth daily., Disp: , Rfl:    cholestyramine light (PREVALITE) 4 g packet, Take 4 g by mouth 2 (two) times daily., Disp: , Rfl:    dapagliflozin propanediol (FARXIGA) 10 MG TABS tablet, Take 1 tablet (10 mg total) by mouth daily before breakfast. Pharmacy note: please split bill Farxiga to [facility] RX BIN: [id number] RX PCN: [id number] RX GROUP: [id number] RX ID: [id number], Disp: 90 tablet, Rfl: 3   digoxin (LANOXIN) 0.125 MG tablet, Take 1 tablet (0.125 mg total) by mouth every other day., Disp: 45 tablet, Rfl: 3   enzalutamide (XTANDI) 40 MG tablet, Take 160 mg by mouth daily., Disp: , Rfl:    Evolocumab (REPATHA SURECLICK) 140 MG/ML SOAJ, INJECT 1 DOSE UNDER THE SKIN EVERY 14 DAYS, Disp: 6 mL, Rfl: 3   metoprolol succinate (TOPROL XL) 25 MG 24 hr tablet, Take 1 tablet (25 mg total) by mouth at bedtime., Disp: 90 tablet, Rfl: 3   Multiple Vitamins-Minerals (PRESERVISION/LUTEIN) CAPS, Take 1 capsule by mouth at bedtime., Disp: , Rfl:    omeprazole (PRILOSEC) 40 MG capsule, Take 40 mg by mouth daily., Disp: , Rfl:    pregabalin (LYRICA) 100 MG capsule, Take 100 mg by mouth 3 (three) times daily., Disp: , Rfl:    rosuvastatin (CRESTOR) 5 MG tablet, Take 5 mg by mouth daily., Disp: , Rfl:    spironolactone (ALDACTONE) 25 MG tablet, Take 1 tablet (25 mg total) by mouth daily., Disp: 90 tablet, Rfl: 1   tamsulosin (FLOMAX) 0.4 MG CAPS capsule, Take 1 capsule (0.4 mg total) by mouth daily., Disp: 30 capsule, Rfl: 0   torsemide (DEMADEX) 20 MG tablet, Alternate 60 mg with 40 mg, Disp: 180 tablet, Rfl: 3   traZODone (DESYREL) 100 MG tablet, Take 50 mg by mouth at bedtime., Disp: , Rfl:  Allergies  Allergen Reactions   Ezetimibe Other (See Comments)    Myalgia   Gabapentin Itching and Other (See Comments)    Sore throat, breakouts   Statins Other (See Comments)    Myalgias (intolerance)     Social History   Socioeconomic History   Marital  status: Married    Spouse name: Not on file   Number of children: Not on file   Years of education: Not on file   Highest education level: Not on file  Occupational History   Not on file  Tobacco Use   Smoking status: Every Day    Packs/day: 0.50    Years: 50.00    Additional pack years: 0.00    Total pack years: 25.00    Types: Cigarettes   Smokeless tobacco: Never  Vaping Use   Vaping Use: Never used  Substance and Sexual Activity   Alcohol use: Not Currently   Drug use: Never   Sexual activity: Not on file  Other Topics Concern   Not on file  Social History Narrative   Not on file   Social Determinants of Health   Financial Resource Strain: Low Risk  (05/22/2022)   Overall Financial Resource Strain (CARDIA)    Difficulty of Paying Living Expenses: Not very hard  Food Insecurity: No Food Insecurity (05/22/2022)   Hunger Vital Sign    Worried About Running Out of Food in the Last Year: Never true    Ran Out of Food in the Last Year: Never true  Transportation Needs: No Transportation Needs (05/22/2022)   PRAPARE - Administrator, Civil Service (Medical): No    Lack of Transportation (Non-Medical): No  Physical Activity: Not on file  Stress: Not on file  Social Connections: Not on file  Intimate Partner Violence: Not on file    Physical Exam      Future Appointments  Date Time Provider Department Center  02/26/2023  9:15 AM Clerance Lav, DPM [facility]  03/21/2023  9:00 AM [facility] PA/NP [facility] None  03/24/2023  3:40 PM [facility] DEVICE REMOTES [facility]  06/12/2023  9:30 AM [facility] None  06/13/2023  9:30 AM Weston Settle, MD [facility] None  06/23/2023  3:40 PM [facility] DEVICE REMOTES [facility]  09/22/2023  3:40 PM [facility] DEVICE REMOTES [facility]

## 2023-02-26 ENCOUNTER — Other Ambulatory Visit (HOSPITAL_COMMUNITY): Payer: Self-pay

## 2023-02-26 ENCOUNTER — Encounter: Payer: Medicare Other | Admitting: Podiatry

## 2023-02-26 NOTE — Progress Notes (Signed)
Paramedicine Encounter    Patient ID: Tommy Ward, male    DOB: Nov 01, 1940, 82 y.o.   MRN: [id number]   Complaints - none  Assessment - CAOx4, warm and dry. Lung sounds clear. No swelling noted.   Compliance with meds - 1x missed morning dose   Pill box filled - for 1x week   Refills needed - Cetirizine and xtandi   Meds changes since last visit - Repatha arrived  from [name], first dose to be on Friday   Social changes - none   BP 100/62   Pulse 70   Resp 16   Wt 170 lb 9.6 oz (77.4 kg)   SpO2 97%   BMI 25.19 kg/m   Last visit weight-170lb   Arrived to find Tommy Ward ambulatory in the home. Pt denied shortness of breath, chest pain, or dizziness. Pt had no complains at present, reported that his Repatha had finally come in and he would be able to resume compliance with same on Friday. Pt was assessed as noted. Pill box filled for 1x week. Upcoming appointments reviewed with pt. Pt was informed that he missed an appointment today and was advised to reschedule same. Home visit complete. Will follow up in 1x week.   [name], EMT-Paramedic [phone]  ACTION: Home visit completed    Patient Care Team: Buckner Malta, MD as PCP - General (Family Medicine) Rennis Golden Lisette Abu, MD as PCP - Cardiology (Cardiology) Lanier Prude, MD as PCP - Electrophysiology (Cardiology) Weston Settle, MD as Consulting Physician (Oncology) Dora Sims, DDS as Referring Physician (Oral Surgery) Debroah Baller, MD as Consulting Physician (Urology)  Patient Active Problem List   Diagnosis Date Noted   Vitamin D toxicity 07/24/2022   Anemia due to stage 4 chronic kidney disease (HCC) 03/20/2022   Hypercalcemia of malignancy 02/16/2021   Chronic systolic heart failure (HCC)    Abnormal stress test 12/05/2020   Depressed left ventricular ejection fraction 12/05/2020   Nonrheumatic mitral valve regurgitation 12/05/2020   Pre-diabetes 12/05/2020   Pulmonary  hypertension (HCC) 12/05/2020   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HCC) 12/05/2020   Tobacco use 12/05/2020   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   HFrEF (heart failure with reduced ejection fraction) (HCC) 11/23/2020   CAD (coronary artery disease) 11/23/2020   GERD (gastroesophageal reflux disease)    Hyperlipidemia    Hypertension    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   Malignant neoplasm of prostate (HCC) 06/02/2020   AKI (acute kidney injury) (HCC) 11/25/2019   Hyperphosphatemia 11/25/2019   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   Hyperkalemia 08/24/2019   Metabolic bone disease 08/24/2019   Vitamin D deficiency 08/24/2019   Benign hypertension with chronic kidney disease, stage III (HCC) 05/20/2019   Decreased cardiac ejection fraction 05/20/2019   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) 05/20/2019   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 02/24/2019   Olecranon bursitis of left elbow 06/25/2018   B12 deficiency 03/22/2018   Gastroesophageal reflux disease without esophagitis 03/22/2018   Chronic renal insufficiency, stage III (moderate) (HCC) 11/27/2015   Aortic valve disorder 07/08/2014   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   Cataract 11/2013   Essential hypertension 07/06/2012   Coronary arteriosclerosis 07/06/2012   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   Left bundle branch block 07/06/2012   Mixed hyperlipidemia 07/06/2012   Cancer (  HCC) 08/2008   S/P radiation therapy > 12 wks ago 2010    Current Outpatient Medications:    aspirin EC 81 MG tablet, Take 1 tablet (81 mg total) by mouth daily., Disp: 90 tablet, Rfl: 3   Cetirizine HCl 10 MG CAPS, Take 1 capsule by mouth daily., Disp: , Rfl:    cholestyramine light (PREVALITE) 4 g packet, Take 4 g by  mouth 2 (two) times daily., Disp: , Rfl:    dapagliflozin propanediol (FARXIGA) 10 MG TABS tablet, Take 1 tablet (10 mg total) by mouth daily before breakfast. Pharmacy note: please split bill Farxiga to [facility] RX BIN: [id number] RX PCN: [id number] RX GROUP: [id number] RX ID: [id number], Disp: 90 tablet, Rfl: 3   digoxin (LANOXIN) 0.125 MG tablet, Take 1 tablet (0.125 mg total) by mouth every other day., Disp: 45 tablet, Rfl: 3   enzalutamide (XTANDI) 40 MG tablet, Take 160 mg by mouth daily., Disp: , Rfl:    Evolocumab (REPATHA SURECLICK) 140 MG/ML SOAJ, INJECT 1 DOSE UNDER THE SKIN EVERY 14 DAYS, Disp: 6 mL, Rfl: 3   metoprolol succinate (TOPROL XL) 25 MG 24 hr tablet, Take 1 tablet (25 mg total) by mouth at bedtime., Disp: 90 tablet, Rfl: 3   Multiple Vitamins-Minerals (PRESERVISION/LUTEIN) CAPS, Take 1 capsule by mouth at bedtime., Disp: , Rfl:    omeprazole (PRILOSEC) 40 MG capsule, Take 40 mg by mouth daily., Disp: , Rfl:    pregabalin (LYRICA) 100 MG capsule, Take 100 mg by mouth 3 (three) times daily., Disp: , Rfl:    rosuvastatin (CRESTOR) 5 MG tablet, Take 5 mg by mouth daily., Disp: , Rfl:    spironolactone (ALDACTONE) 25 MG tablet, Take 1 tablet (25 mg total) by mouth daily., Disp: 90 tablet, Rfl: 1   tamsulosin (FLOMAX) 0.4 MG CAPS capsule, Take 1 capsule (0.4 mg total) by mouth daily., Disp: 30 capsule, Rfl: 0   torsemide (DEMADEX) 20 MG tablet, Alternate 60 mg with 40 mg, Disp: 180 tablet, Rfl: 3   traZODone (DESYREL) 100 MG tablet, Take 50 mg by mouth at bedtime., Disp: , Rfl:  Allergies  Allergen Reactions   Ezetimibe Other (See Comments)    Myalgia   Gabapentin Itching and Other (See Comments)    Sore throat, breakouts   Statins Other (See Comments)    Myalgias (intolerance)     Social History   Socioeconomic History   Marital status: Married    Spouse name: Not on file   Number of children: Not on file   Years of education: Not on file   Highest education level: Not on  file  Occupational History   Not on file  Tobacco Use   Smoking status: Every Day    Packs/day: 0.50    Years: 50.00    Additional pack years: 0.00    Total pack years: 25.00    Types: Cigarettes   Smokeless tobacco: Never  Vaping Use   Vaping Use: Never used  Substance and Sexual Activity   Alcohol use: Not Currently   Drug use: Never   Sexual activity: Not on file  Other Topics Concern   Not on file  Social History Narrative   Not on file   Social Determinants of Health   Financial Resource Strain: Low Risk  (05/22/2022)   Overall Financial Resource Strain (CARDIA)    Difficulty of Paying Living Expenses: Not very hard  Food Insecurity: No Food Insecurity (05/22/2022)   Hunger Vital Sign    Worried About  Running Out of Food in the Last Year: Never true    Ran Out of Food in the Last Year: Never true  Transportation Needs: No Transportation Needs (05/22/2022)   PRAPARE - Administrator, Civil Service (Medical): No    Lack of Transportation (Non-Medical): No  Physical Activity: Not on file  Stress: Not on file  Social Connections: Not on file  Intimate Partner Violence: Not on file    Physical Exam      Future Appointments  Date Time Provider Department Center  03/21/2023  9:00 AM [facility] PA/NP [facility] None  03/24/2023  3:40 PM [facility] DEVICE REMOTES [facility]  06/12/2023  9:30 AM [facility] None  06/13/2023  9:30 AM Weston Settle, MD [facility] None  06/23/2023  3:40 PM [facility] DEVICE REMOTES [facility]  09/22/2023  3:40 PM [facility] DEVICE REMOTES [facility]

## 2023-02-26 NOTE — Progress Notes (Signed)
Patient was a no-show for today's scheduled appointment.

## 2023-02-27 IMAGING — CR DG CHEST 2V
2 series · 2 of 2 positions shown · non-contrast
Comparison: None.

CLINICAL DATA: Postop ICD

EXAM:
CHEST - 2 VIEW

[w chest pa]
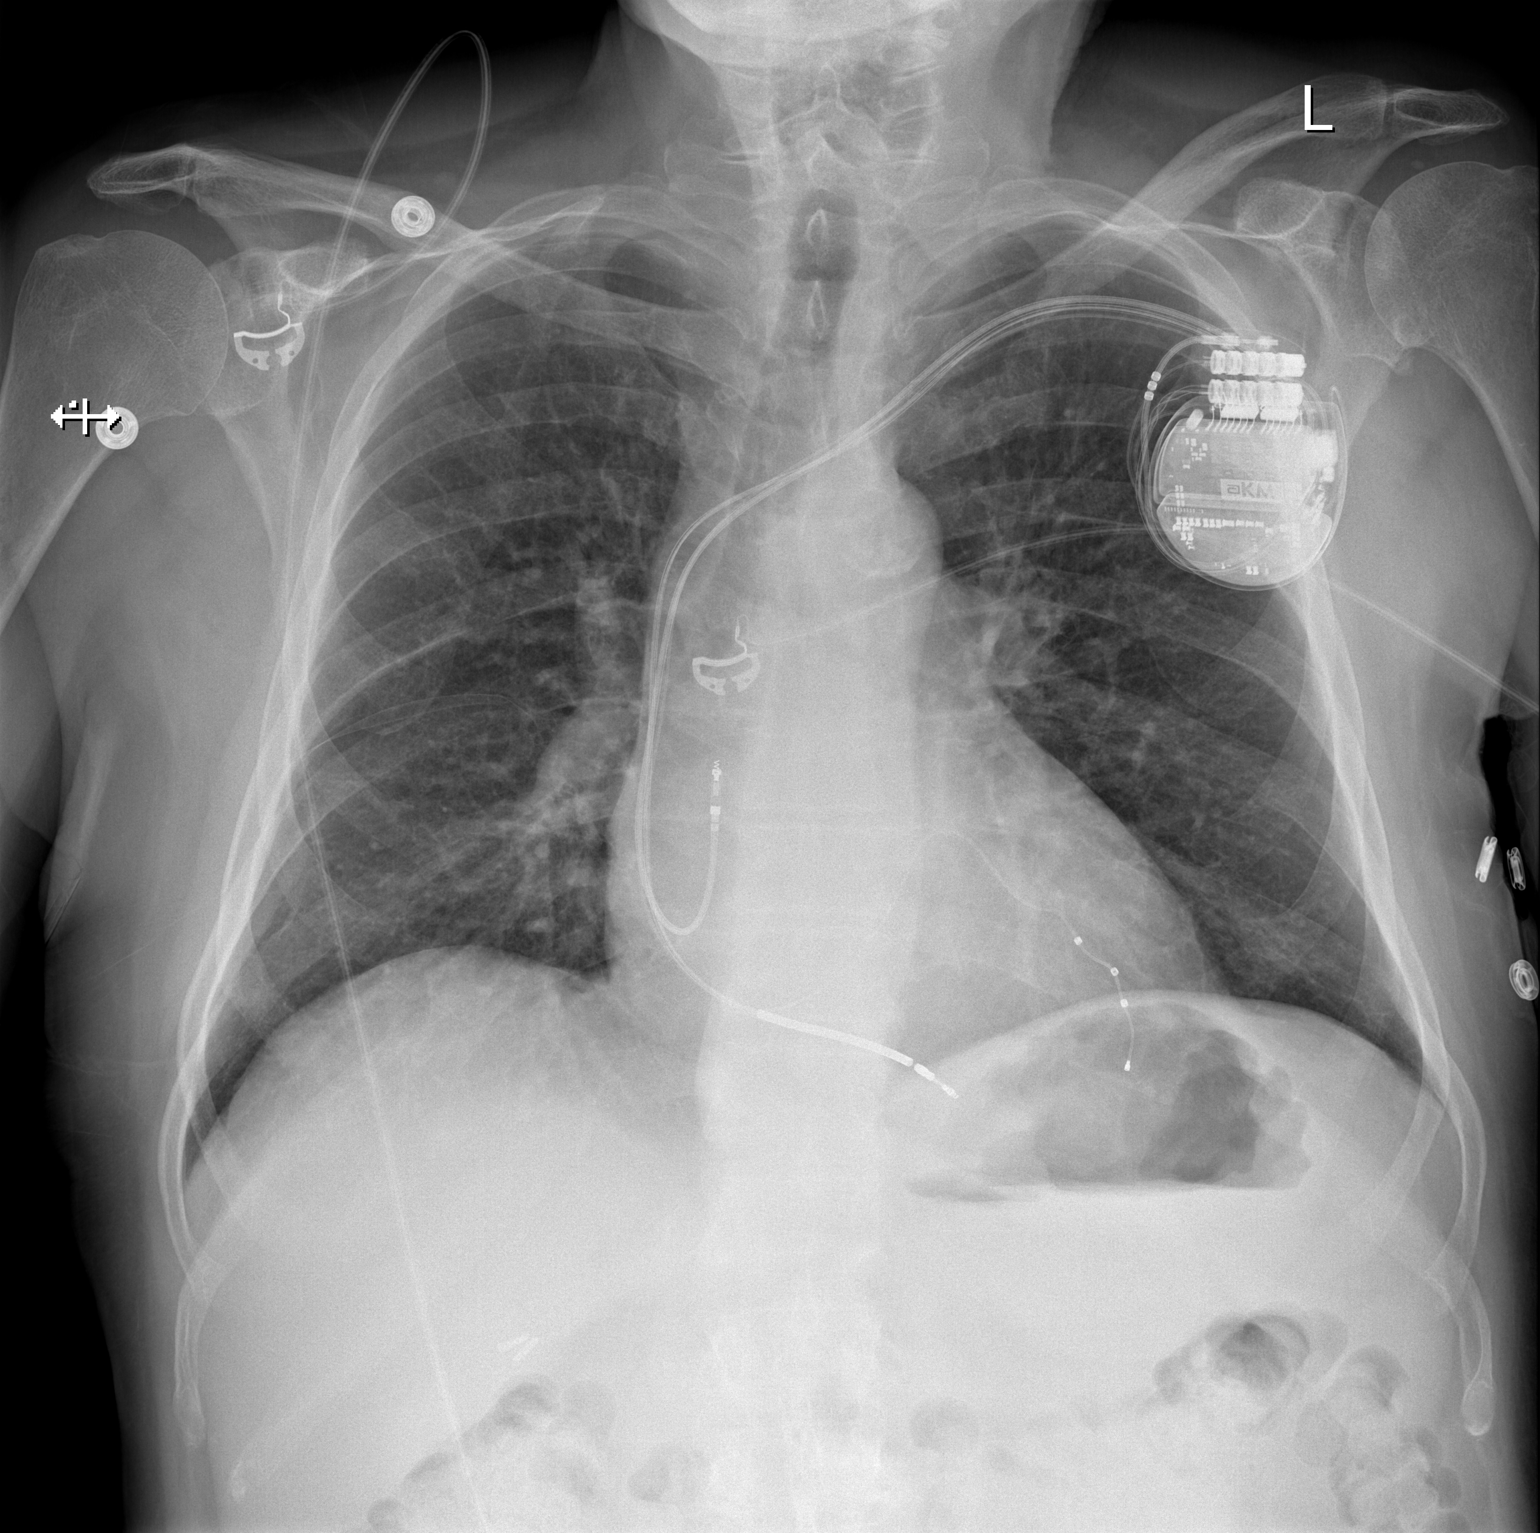

[w chest lat]
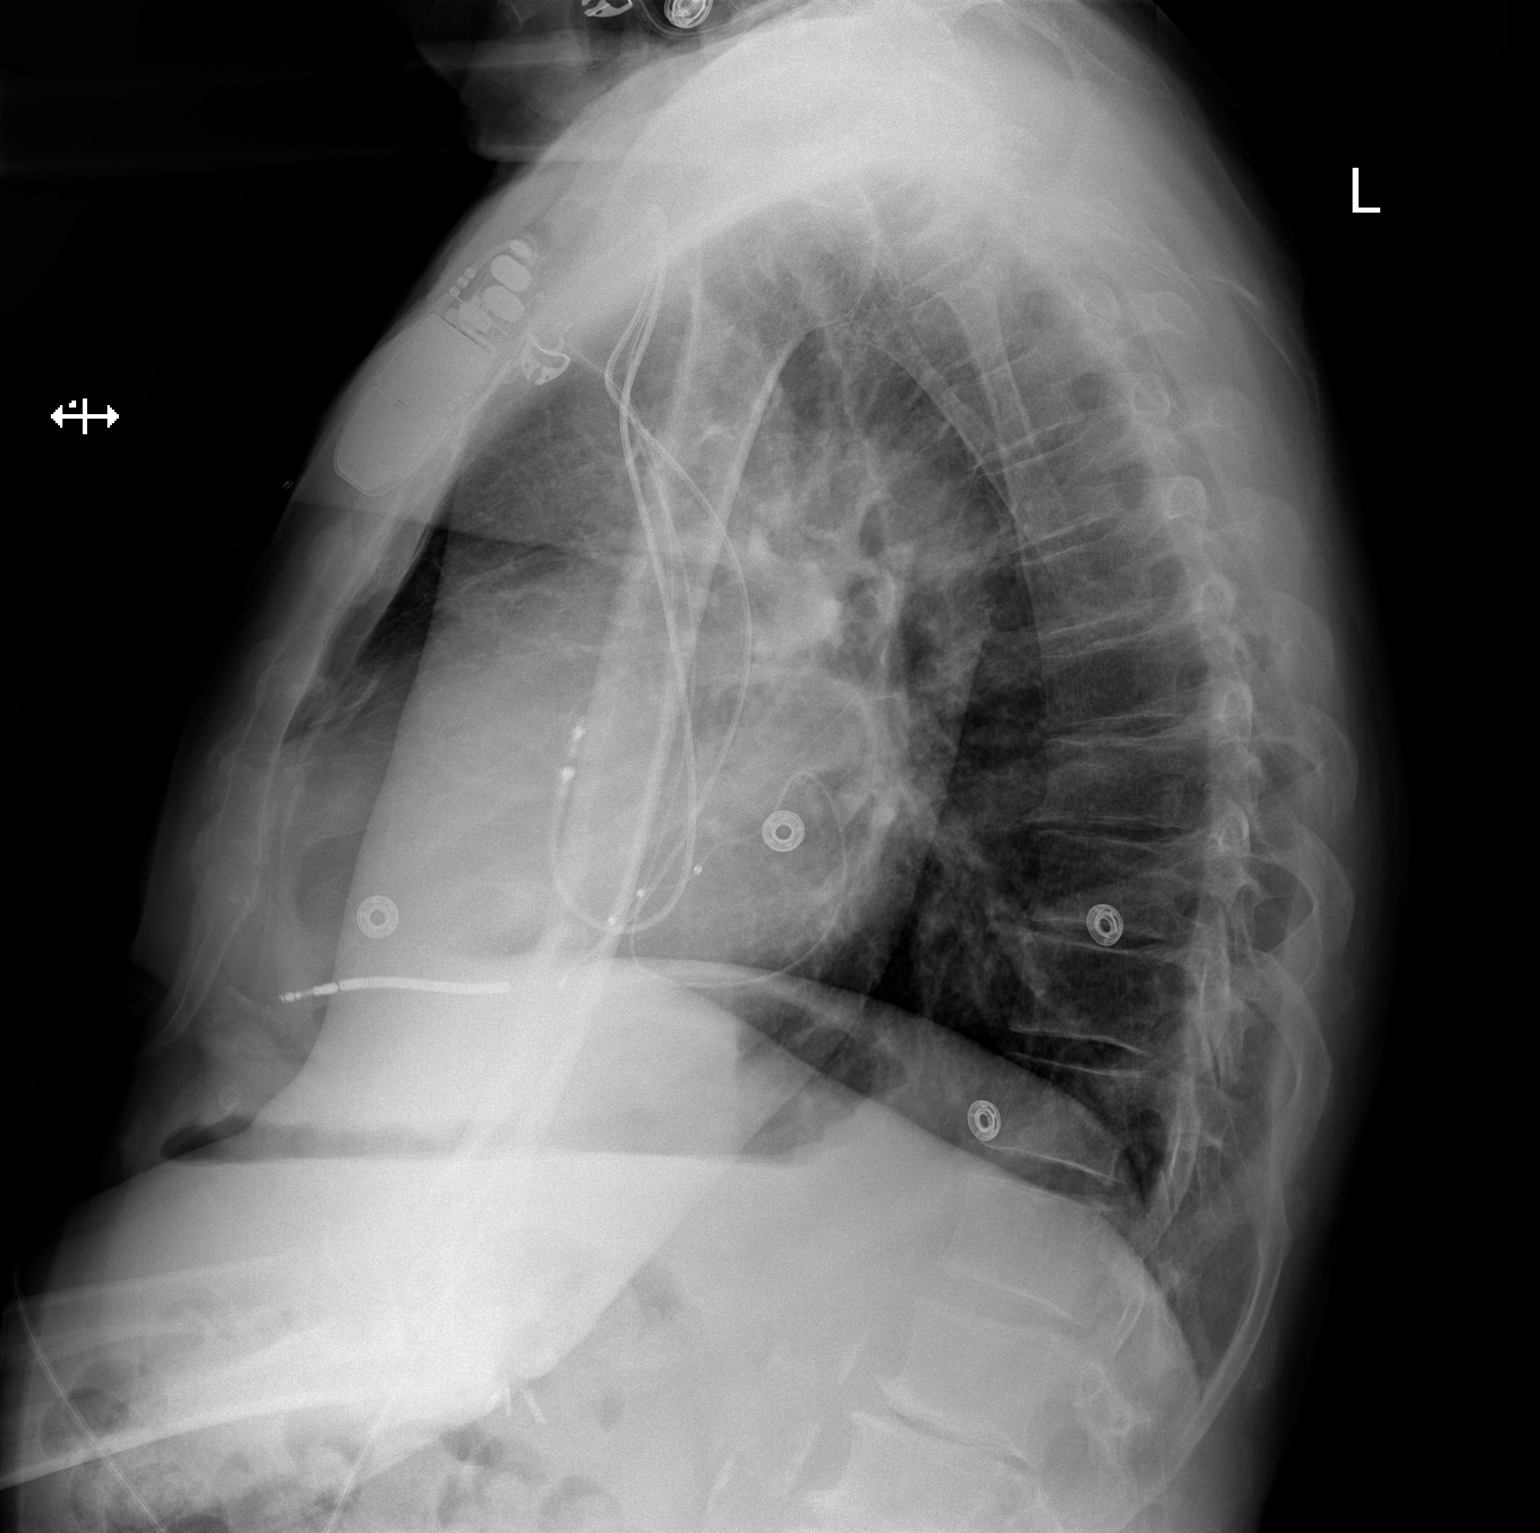

[2 of 2 positions shown; findings below may reference images not displayed]

FINDINGS: There is a 3 lead implantable cardiac device, with the 2 larger
leads terminating in the right atrium and right ventricle. The
cardiomediastinal silhouette is within normal limits. No pleural
effusion. No pneumothorax. No focal consolidation. No acute osseous
abnormality.
IMPRESSION: There is a 3 lead implantable cardiac device as described. No acute
radiographic cardiopulmonary process.

## 2023-03-05 ENCOUNTER — Other Ambulatory Visit (HOSPITAL_COMMUNITY): Payer: Self-pay

## 2023-03-05 ENCOUNTER — Other Ambulatory Visit: Payer: Self-pay | Admitting: Cardiology

## 2023-03-05 NOTE — Progress Notes (Signed)
Paramedicine Encounter    Patient ID: Tommy Ward, male    DOB: 05/14/1941, 82 y.o.   MRN: [id number]   Complaints- none   Assessment- CAOX4, warm and dry, with no shortness of breath, dizziness, no swelling.   Compliance with meds- no missed doses   Pill box filled- for one week   Refills needed- none   Meds changes since last visit- none     Social changes- none    Arrived for home visit for Tommy Ward who reports to be feeling good with no concerns this week. He has had no missed doses. Vitals and assessment obtained. Lungs clear, no shortness of breath, no dizziness, no chest pain, no swelling. Medications reviewed and pill box filled for one week. No refills needed. Appointments reviewed.   BP (!) 140/80   Pulse 70   Resp 16   Wt 170 lb (77.1 kg)   SpO2 97%   BMI 25.10 kg/m  Weight yesterday-- didn't weigh  Last visit weight-- 170lbs   [name], EMT-Paramedic [phone]  ACTION: Home visit completed    Patient Care Team: Buckner Malta, MD as PCP - General (Family Medicine) Rennis Golden, Lisette Abu, MD as PCP - Cardiology (Cardiology) Lanier Prude, MD as PCP - Electrophysiology (Cardiology) Weston Settle, MD as Consulting Physician (Oncology) Dora Sims, DDS as Referring Physician (Oral Surgery) Debroah Baller, MD as Consulting Physician (Urology)  Patient Active Problem List   Diagnosis Date Noted   Vitamin D toxicity 07/24/2022   Anemia due to stage 4 chronic kidney disease (HCC) 03/20/2022   Hypercalcemia of malignancy 02/16/2021   Chronic systolic heart failure (HCC)    Abnormal stress test 12/05/2020   Depressed left ventricular ejection fraction 12/05/2020   Nonrheumatic mitral valve regurgitation 12/05/2020   Pre-diabetes 12/05/2020   Pulmonary hypertension (HCC) 12/05/2020   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HCC) 12/05/2020   Tobacco use 12/05/2020   Dilated  cardiomyopathy (HCC) 12/05/2020   HFrEF (heart failure with reduced ejection fraction) (HCC) 11/23/2020   CAD (coronary artery disease) 11/23/2020   GERD (gastroesophageal reflux disease)    Hyperlipidemia    Hypertension    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   Malignant neoplasm of prostate (HCC) 06/02/2020   AKI (acute kidney injury) (HCC) 11/25/2019   Hyperphosphatemia 11/25/2019   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   Hyperkalemia 08/24/2019   Metabolic bone disease 08/24/2019   Vitamin D deficiency 08/24/2019   Benign hypertension with chronic kidney disease, stage III (HCC) 05/20/2019   Decreased cardiac ejection fraction 05/20/2019   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) 05/20/2019   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 02/24/2019   Olecranon bursitis of left elbow 06/25/2018   B12 deficiency 03/22/2018   Gastroesophageal reflux disease without esophagitis 03/22/2018   Chronic renal insufficiency, stage III (moderate) (HCC) 11/27/2015   Aortic valve disorder 07/08/2014   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   Cataract 11/2013   Essential hypertension 07/06/2012   Coronary arteriosclerosis 07/06/2012   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   Left bundle branch block 07/06/2012   Mixed hyperlipidemia 07/06/2012   Cancer (HCC) 08/2008   S/P radiation therapy > 12 wks ago 2010    Current Outpatient Medications:    aspirin EC 81 MG tablet, Take 1 tablet (81 mg total) by mouth daily., Disp: 90 tablet, Rfl: 3  Cetirizine HCl 10 MG CAPS, Take 1 capsule by mouth daily., Disp: , Rfl:    dapagliflozin propanediol (FARXIGA) 10 MG TABS tablet, Take 1 tablet (10 mg total) by mouth daily before breakfast. Pharmacy note: please split bill Farxiga to [facility] RX BIN: [id number] RX PCN: [id number] RX GROUP: [id number] RX ID:  [id number], Disp: 90 tablet, Rfl: 3   digoxin (LANOXIN) 0.125 MG tablet, Take 1 tablet (0.125 mg total) by mouth every other day., Disp: 45 tablet, Rfl: 3   enzalutamide (XTANDI) 40 MG tablet, Take 160 mg by mouth daily., Disp: , Rfl:    Evolocumab (REPATHA SURECLICK) 140 MG/ML SOAJ, INJECT 1 DOSE UNDER THE SKIN EVERY 14 DAYS, Disp: 6 mL, Rfl: 3   metoprolol succinate (TOPROL XL) 25 MG 24 hr tablet, Take 1 tablet (25 mg total) by mouth at bedtime., Disp: 90 tablet, Rfl: 3   Multiple Vitamins-Minerals (PRESERVISION/LUTEIN) CAPS, Take 1 capsule by mouth at bedtime., Disp: , Rfl:    omeprazole (PRILOSEC) 40 MG capsule, Take 40 mg by mouth daily., Disp: , Rfl:    pregabalin (LYRICA) 100 MG capsule, Take 100 mg by mouth 3 (three) times daily., Disp: , Rfl:    rosuvastatin (CRESTOR) 5 MG tablet, Take 5 mg by mouth daily., Disp: , Rfl:    spironolactone (ALDACTONE) 25 MG tablet, Take 1 tablet (25 mg total) by mouth daily., Disp: 90 tablet, Rfl: 1   tamsulosin (FLOMAX) 0.4 MG CAPS capsule, Take 1 capsule (0.4 mg total) by mouth daily., Disp: 30 capsule, Rfl: 0   torsemide (DEMADEX) 20 MG tablet, Alternate 60 mg with 40 mg, Disp: 180 tablet, Rfl: 3   traZODone (DESYREL) 100 MG tablet, Take 50 mg by mouth at bedtime., Disp: , Rfl:    cholestyramine light (PREVALITE) 4 g packet, Take 4 g by mouth 2 (two) times daily. (Patient not taking: Reported on 03/05/2023), Disp: , Rfl:  Allergies  Allergen Reactions   Ezetimibe Other (See Comments)    Myalgia   Gabapentin Itching and Other (See Comments)    Sore throat, breakouts   Statins Other (See Comments)    Myalgias (intolerance)     Social History   Socioeconomic History   Marital status: Married    Spouse name: Not on file   Number of children: Not on file   Years of education: Not on file   Highest education level: Not on file  Occupational History   Not on file  Tobacco Use   Smoking status: Every Day    Current packs/day: 0.50    Average  packs/day: 0.5 packs/day for 50.0 years (25.0 ttl pk-yrs)    Types: Cigarettes   Smokeless tobacco: Never  Vaping Use   Vaping status: Never Used  Substance and Sexual Activity   Alcohol use: Not Currently   Drug use: Never   Sexual activity: Not on file  Other Topics Concern   Not on file  Social History Narrative   Not on file   Social Determinants of Health   Financial Resource Strain: Low Risk  (05/22/2022)   Overall Financial Resource Strain (CARDIA)    Difficulty of Paying Living Expenses: Not very hard  Food Insecurity: No Food Insecurity (05/22/2022)   Hunger Vital Sign    Worried About Running Out of Food in the Last Year: Never true    Ran Out of Food in the Last Year: Never true  Transportation Needs: No Transportation Needs (05/22/2022)   PRAPARE - Transportation    Lack of Transportation (  Medical): No    Lack of Transportation (Non-Medical): No  Physical Activity: Not on file  Stress: Not on file  Social Connections: Not on file  Intimate Partner Violence: Not on file    Physical Exam      Future Appointments  Date Time Provider Department Center  03/13/2023  9:15 AM Clerance Lav, DPM [facility]  03/21/2023  9:00 AM [facility] PA/NP [facility] None  03/24/2023  3:40 PM [facility]ICE [facility]OTES [facility]  06/12/2023  9:30 AM [facility] None  06/13/2023  9:30 AM Weston Settle, MD [facility] None  06/23/2023  3:40 PM [facility] DEVICE REMOTES [facility]  09/22/2023  3:40 PM [facility] DEVICE REMOTES [facility]

## 2023-03-12 ENCOUNTER — Other Ambulatory Visit (HOSPITAL_COMMUNITY): Payer: Self-pay | Admitting: Emergency Medicine

## 2023-03-12 ENCOUNTER — Telehealth (HOSPITAL_COMMUNITY): Payer: Self-pay | Admitting: Emergency Medicine

## 2023-03-12 NOTE — Progress Notes (Signed)
Paramedicine Encounter    Patient ID: Tommy Ward, male    DOB: 08-18-1941, 82 y.o.   MRN: [id number]   Complaints  - can palpate external "thumping" to his left flank.  - Exertionally short of breath in this heat   Assessment - CAOx4, warm and dry. Ambulatory on scene without difficutly. W  Compliance with meds - no missed medications  Pill box filled -  for 1x week.   Refills needed - Rosuvastatin  Meds changes since last visit 0 none    Social changes - none   VISIT SUMMARY**  Met with Tommy Ward in his home today. Pt reports that increased exertional shortness of breath with the increased heat. Pt reports that he stays in the garage and works in shifts to decrease fatigue. Pt also reports feeling an increased "thumping" on his left flank. Pt reports the sensation increased when lying on his side. We deduced that he had this same sensation when he had his device put in initially, but reported that the feeling subsided and has returned in the past month. While at the house we sent a device transmission for further review of his device and pt was advised to follow up with EP and device clinic. Pt was assessed as noted with no other complaints. Pill box was filled for 1x week. Upcoming appointments reviewed with pt and wife. Will follow up with Tommy Ward in 1x week. Home visit complete.   BP 128/62   Pulse 70   Resp 16   Wt 171 lb 6.4 oz (77.7 kg)   SpO2 98%   BMI 25.31 kg/m  Weight yesterday- did not weigh Last visit weight-170lbs     ACTION: Home visit completed  B[place] Setting EMT-P Community Paramedic  [phone]     Patient Care Team: Buckner Malta, MD as PCP - General (Family Medicine) Rennis Golden Lisette Abu, MD as PCP - Cardiology (Cardiology) Lanier Prude, MD as PCP - Electrophysiology (Cardiology) Weston Settle, MD as Consulting Physician (Oncology) Dora Sims, DDS as Referring Physician (Oral Surgery) Debroah Baller, MD as Consulting  Physician (Urology)  Patient Active Problem List   Diagnosis Date Noted   Vitamin D toxicity 07/24/2022   Anemia due to stage 4 chronic kidney disease (HCC) 03/20/2022   Hypercalcemia of malignancy 02/16/2021   Chronic systolic heart failure (HCC)    Abnormal stress test 12/05/2020   Depressed left ventricular ejection fraction 12/05/2020   Nonrheumatic mitral valve regurgitation 12/05/2020   Pre-diabetes 12/05/2020   Pulmonary hypertension (HCC) 12/05/2020   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HCC) 12/05/2020   Tobacco use 12/05/2020   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   HFrEF (heart failure with reduced ejection fraction) (HCC) 11/23/2020   CAD (coronary artery disease) 11/23/2020   GERD (gastroesophageal reflux disease)    Hyperlipidemia    Hypertension    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   Malignant neoplasm of prostate (HCC) 06/02/2020   AKI (acute kidney injury) (HCC) 11/25/2019   Hyperphosphatemia 11/25/2019   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   Hyperkalemia 08/24/2019   Metabolic bone disease 08/24/2019   Vitamin D deficiency 08/24/2019   Benign hypertension with chronic kidney disease, stage III (HCC) 05/20/2019   Decreased cardiac ejection fraction 05/20/2019   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) 05/20/2019   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 02/24/2019  Olecranon bursitis of left elbow 06/25/2018   B12 deficiency 03/22/2018   Gastroesophageal reflux disease without esophagitis 03/22/2018   Chronic renal insufficiency, stage III (moderate) (HCC) 11/27/2015   Aortic valve disorder 07/08/2014   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   Cataract 11/2013   Essential hypertension 07/06/2012   Coronary arteriosclerosis 07/06/2012   Hypertensive heart disease without  congestive heart failure 07/06/2012   Left bundle branch block 07/06/2012   Mixed hyperlipidemia 07/06/2012   Cancer (HCC) 08/2008   S/P radiation therapy > 12 wks ago 2010    Current Outpatient Medications:    aspirin EC 81 MG tablet, Take 1 tablet (81 mg total) by mouth daily., Disp: 90 tablet, Rfl: 3   Cetirizine HCl 10 MG CAPS, Take 1 capsule by mouth daily., Disp: , Rfl:    dapagliflozin propanediol (FARXIGA) 10 MG TABS tablet, Take 1 tablet (10 mg total) by mouth daily before breakfast. Pharmacy note: please split bill Farxiga to [facility] RX BIN: [id number] RX PCN: [id number] RX GROUP: [id number] RX ID: [id number], Disp: 90 tablet, Rfl: 3   digoxin (LANOXIN) 0.125 MG tablet, Take 1 tablet (0.125 mg total) by mouth every other day., Disp: 45 tablet, Rfl: 3   enzalutamide (XTANDI) 40 MG tablet, Take 160 mg by mouth daily., Disp: , Rfl:    Evolocumab (REPATHA SURECLICK) 140 MG/ML SOAJ, INJECT 1 DOSE UNDER THE SKIN EVERY 14 DAYS, Disp: 6 mL, Rfl: 3   metoprolol succinate (TOPROL XL) 25 MG 24 hr tablet, Take 1 tablet (25 mg total) by mouth at bedtime., Disp: 90 tablet, Rfl: 3   Multiple Vitamins-Minerals (PRESERVISION/LUTEIN) CAPS, Take 1 capsule by mouth at bedtime., Disp: , Rfl:    omeprazole (PRILOSEC) 40 MG capsule, Take 40 mg by mouth daily., Disp: , Rfl:    pregabalin (LYRICA) 100 MG capsule, Take 100 mg by mouth 3 (three) times daily., Disp: , Rfl:    rosuvastatin (CRESTOR) 5 MG tablet, Take 5 mg by mouth daily., Disp: , Rfl:    spironolactone (ALDACTONE) 25 MG tablet, Take 1 tablet (25 mg total) by mouth daily., Disp: 90 tablet, Rfl: 1   tamsulosin (FLOMAX) 0.4 MG CAPS capsule, Take 1 capsule (0.4 mg total) by mouth daily., Disp: 30 capsule, Rfl: 0   torsemide (DEMADEX) 20 MG tablet, Alternate 60 mg with 40 mg, Disp: 180 tablet, Rfl: 3   traZODone (DESYREL) 100 MG tablet, Take 50 mg by mouth at bedtime., Disp: , Rfl:    cholestyramine light (PREVALITE) 4 g packet, Take 4 g by mouth 2 (two)  times daily. (Patient not taking: Reported on 03/05/2023), Disp: , Rfl:  Allergies  Allergen Reactions   Ezetimibe Other (See Comments)    Myalgia   Gabapentin Itching and Other (See Comments)    Sore throat, breakouts   Statins Other (See Comments)    Myalgias (intolerance)     Social History   Socioeconomic History   Marital status: Married    Spouse name: Not on file   Number of children: Not on file   Years of education: Not on file   Highest education level: Not on file  Occupational History   Not on file  Tobacco Use   Smoking status: Every Day    Current packs/day: 0.50    Average packs/day: 0.5 packs/day for 50.0 years (25.0 ttl pk-yrs)    Types: Cigarettes   Smokeless tobacco: Never  Vaping Use   Vaping status: Never Used  Substance and Sexual Activity   Alcohol use: Not  Currently   Drug use: Never   Sexual activity: Not on file  Other Topics Concern   Not on file  Social History Narrative   Not on file   Social Determinants of Health   Financial Resource Strain: Low Risk  (05/22/2022)   Overall Financial Resource Strain (CARDIA)    Difficulty of Paying Living Expenses: Not very hard  Food Insecurity: No Food Insecurity (05/22/2022)   Hunger Vital Sign    Worried About Running Out of Food in the Last Year: Never true    Ran Out of Food in the Last Year: Never true  Transportation Needs: No Transportation Needs (05/22/2022)   PRAPARE - Administrator, Civil Service (Medical): No    Lack of Transportation (Non-Medical): No  Physical Activity: Not on file  Stress: Not on file  Social Connections: Not on file  Intimate Partner Violence: Not on file    Physical Exam      Future Appointments  Date Time Provider Department Center  03/17/2023 10:30 AM Pilar Plate, DPM [facility]  03/21/2023  9:00 AM [facility] PA/NP [facility] None  03/24/2023  3:40 PM [facility] DEVICE REMOTES [facility]  06/12/2023  9:30 AM  [facility] None  06/13/2023  9:30 AM Weston Settle, MD [facility] None  06/23/2023  3:40 PM [facility] DEVICE REMOTES [facility]  09/22/2023  3:40 PM [facility] DEVICE REMOTES [facility]

## 2023-03-12 NOTE — Telephone Encounter (Signed)
Spoke with [place] today. Pt reports feeling an increased "thumping" on his left flank. Pt reports the sensation increased when lying on his side. We deduced that he had this same sensation when he had his device put in initially, but reported that the feeling subsided and has returned in the past month. While at the house we sent a device transmission for further review of his device.   Please advise on any need for follow up appointment to review same.   Benson Setting EMT-P Community Paramedic  [phone]

## 2023-03-13 ENCOUNTER — Ambulatory Visit: Payer: Medicare Other | Admitting: Podiatry

## 2023-03-13 NOTE — Telephone Encounter (Signed)
Spoke with patients wife and states she is at work and not with patient at this time. States patient told nurse about his complaint yesterday and nurse relayed it to her so she does not know all details. States she will bring patient in today at 1:30 PM and [place] from [place]. Jude has agreed to come in to assist.   Location, date and time discussed with wife w/ verbal understanding.

## 2023-03-14 ENCOUNTER — Encounter: Payer: Self-pay | Admitting: Oncology

## 2023-03-17 ENCOUNTER — Ambulatory Visit (INDEPENDENT_AMBULATORY_CARE_PROVIDER_SITE_OTHER): Payer: Medicare Other | Admitting: Podiatry

## 2023-03-17 ENCOUNTER — Ambulatory Visit: Payer: Medicare Other | Attending: Cardiovascular Disease

## 2023-03-17 DIAGNOSIS — B351 Tinea unguium: Secondary | ICD-10-CM

## 2023-03-17 DIAGNOSIS — M79674 Pain in right toe(s): Secondary | ICD-10-CM | POA: Diagnosis not present

## 2023-03-17 DIAGNOSIS — M79675 Pain in left toe(s): Secondary | ICD-10-CM | POA: Diagnosis not present

## 2023-03-17 DIAGNOSIS — I5022 Chronic systolic (congestive) heart failure: Secondary | ICD-10-CM

## 2023-03-17 DIAGNOSIS — G629 Polyneuropathy, unspecified: Secondary | ICD-10-CM

## 2023-03-17 NOTE — Progress Notes (Signed)
  Subjective:  Patient ID: Tommy Ward, male    DOB: 07-22-1941,  MRN: [id number]  Tommy Ward presents to clinic today for at risk foot care with history of peripheral neuropathy and painful thick toenails that are difficult to trim. Pain interferes with ambulation. Aggravating factors include wearing enclosed shoe gear. Pain is relieved with periodic professional debridement.  Chief Complaint  Patient presents with   Nail Problem    Routine Foot Care-nail trim     PCP is Buckner Malta, MD.  Allergies  Allergen Reactions   Ezetimibe Other (See Comments)    Myalgia   Gabapentin Itching and Other (See Comments)    Sore throat, breakouts   Statins Other (See Comments)    Myalgias (intolerance)    Review of Systems: Negative except as noted in the HPI.  Objective:  There were no vitals filed for this visit.  Tommy Ward is a pleasant 82 y.o. male WD, WN in NAD. AAO x 3.  Vascular Examination: CFT <3 seconds b/l. DP/PT pulses faintly palpable b/l. Skin temperature gradient warm to warm b/l. No pain with calf compression. No ischemia or gangrene. No cyanosis or clubbing noted b/l. Pedal hair sparse. Varicosities present b/l.   Neurological Examination: Sensation grossly intact b/l with 10 gram monofilament. Vibratory sensation intact b/l. Pt has subjective symptoms of neuropathy.  Dermatological Examination: Pedal skin warm and supple b/l. Toenails 2-5 b/l thick, discolored, elongated with subungual debris and pain on dorsal palpation.    Incurvated nailplate left great toe.  Nail border hypertrophy minimal. There is tenderness to palpation. Sign(s) of infection: no clinical signs of infection noted on examination today..   Musculoskeletal Examination: Muscle strength 5/5 to b/l LE. Limited joint ROM to the 1st MPJ b/l right >left.  Radiographs: None  Assessment/Plan: 1. Pain due to onychomycosis of toenails of both feet   2. Neuropathy     -Patient  was evaluated and treated. All patient's and/or POA's questions/concerns answered on today's visit. -Examined patient. -Cleansed exposed nailbed of right great toe with alchol. Applied triple antibiotic ointment. Patient instructed to apply triple antibiotic ointment to digit once daily for one week. -Continue supportive shoe gear daily. -Mycotic toenails 1-5 bilaterally were debrided in length and girth with sterile nail nippers and dremel without incident.  Return in about 3 months (around 06/17/2023) for RFC.  Pilar Plate, DPM

## 2023-03-18 NOTE — Progress Notes (Signed)
Patient seen in device clinic and changes made by Harlon Ditty. Jude rep. LV output decreased from 1075 V to 1.5V d/t STIM.

## 2023-03-19 ENCOUNTER — Other Ambulatory Visit (HOSPITAL_COMMUNITY): Payer: Self-pay

## 2023-03-19 NOTE — Progress Notes (Signed)
Paramedicine Encounter    Patient ID: Tommy Ward, male    DOB: 1940-12-15, 82 y.o.   MRN: [id number]   Complaints- No complaints   Assessment- CAOX4, warm and dry reporting to be feeling good with no complaints. No shortness of breath, no chest pain, no dizziness, no swelling. Lungs clear. Vitals obtained.   Compliance with meds- no missed doses   Pill box filled- for one week   Refills needed- spiro, torsemide   Meds changes since last visit- none     Social changes- none    VISIT SUMMARY- Arrived for home visit for [facility] who reports to be feeling good with no complaints. Assessment and vitals obtained. Lungs clear, no swelling noted. Meds reviewed and confirmed. Pill box filled for one week. Refills called in to [name]. We reviewed upcoming appointments and confirmed same. Home visit complete. I will plan to see [place] in one week.   There were no vitals taken for this visit. Weight yesterday-- 170lbs  Last visit weight-- 171lbs      ACTION: Home visit completed     Patient Care Team: Buckner Malta, MD as PCP - General (Family Medicine) Rennis Golden, Lisette Abu, MD as PCP - Cardiology (Cardiology) Lanier Prude, MD as PCP - Electrophysiology (Cardiology) Weston Settle, MD as Consulting Physician (Oncology) Dora Sims, DDS as Referring Physician (Oral Surgery) Debroah Baller, MD as Consulting Physician (Urology)  Patient Active Problem List   Diagnosis Date Noted   Vitamin D toxicity 07/24/2022   Anemia due to stage 4 chronic kidney disease (HCC) 03/20/2022   Hypercalcemia of malignancy 02/16/2021   Chronic systolic heart failure (HCC)    Abnormal stress test 12/05/2020   Depressed left ventricular ejection fraction 12/05/2020   Nonrheumatic mitral valve regurgitation 12/05/2020   Pre-diabetes 12/05/2020   Pulmonary hypertension (HCC) 12/05/2020   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   Severe pulmonary hypertension  (HCC) 12/05/2020   Tobacco use 12/05/2020   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   HFrEF (heart failure with reduced ejection fraction) (HCC) 11/23/2020   CAD (coronary artery disease) 11/23/2020   GERD (gastroesophageal reflux disease)    Hyperlipidemia    Hypertension    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   Malignant neoplasm of prostate (HCC) 06/02/2020   AKI (acute kidney injury) (HCC) 11/25/2019   Hyperphosphatemia 11/25/2019   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   Hyperkalemia 08/24/2019   Metabolic bone disease 08/24/2019   Vitamin D deficiency 08/24/2019   Benign hypertension with chronic kidney disease, stage III (HCC) 05/20/2019   Decreased cardiac ejection fraction 05/20/2019   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) 05/20/2019   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 02/24/2019   Olecranon bursitis of left elbow 06/25/2018   B12 deficiency 03/22/2018   Gastroesophageal reflux disease without esophagitis 03/22/2018   Chronic renal insufficiency, stage III (moderate) (HCC) 11/27/2015   Aortic valve disorder 07/08/2014   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   Cataract 11/2013   Essential hypertension 07/06/2012   Coronary arteriosclerosis 07/06/2012   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   Left bundle branch block 07/06/2012   Mixed hyperlipidemia 07/06/2012   Cancer (HCC) 08/2008   S/P radiation therapy > 12 wks ago 2010    Current Outpatient Medications:    aspirin EC 81 MG tablet, Take 1 tablet (81 mg total) by mouth daily., Disp: 90 tablet,  Rfl: 3   Cetirizine HCl 10 MG CAPS, Take 1 capsule by mouth daily., Disp: , Rfl:    cholestyramine light (PREVALITE) 4 g packet, Take 4 g by mouth 2 (two) times daily. (Patient not taking: Reported on 03/05/2023), Disp: , Rfl:    dapagliflozin propanediol  (FARXIGA) 10 MG TABS tablet, Take 1 tablet (10 mg total) by mouth daily before breakfast. Pharmacy note: please split bill Farxiga to [facility] RX BIN: [id number] RX PCN: [id number] RX GROUP: [id number] RX ID: [id number], Disp: 90 tablet, Rfl: 3   digoxin (LANOXIN) 0.125 MG tablet, Take 1 tablet (0.125 mg total) by mouth every other day., Disp: 45 tablet, Rfl: 3   enzalutamide (XTANDI) 40 MG tablet, Take 160 mg by mouth daily., Disp: , Rfl:    Evolocumab (REPATHA SURECLICK) 140 MG/ML SOAJ, INJECT 1 DOSE UNDER THE SKIN EVERY 14 DAYS, Disp: 6 mL, Rfl: 3   metoprolol succinate (TOPROL XL) 25 MG 24 hr tablet, Take 1 tablet (25 mg total) by mouth at bedtime., Disp: 90 tablet, Rfl: 3   Multiple Vitamins-Minerals (PRESERVISION/LUTEIN) CAPS, Take 1 capsule by mouth at bedtime., Disp: , Rfl:    omeprazole (PRILOSEC) 40 MG capsule, Take 40 mg by mouth daily., Disp: , Rfl:    pregabalin (LYRICA) 100 MG capsule, Take 100 mg by mouth 3 (three) times daily., Disp: , Rfl:    rosuvastatin (CRESTOR) 5 MG tablet, Take 5 mg by mouth daily., Disp: , Rfl:    spironolactone (ALDACTONE) 25 MG tablet, Take 1 tablet (25 mg total) by mouth daily., Disp: 90 tablet, Rfl: 1   tamsulosin (FLOMAX) 0.4 MG CAPS capsule, Take 1 capsule (0.4 mg total) by mouth daily., Disp: 30 capsule, Rfl: 0   torsemide (DEMADEX) 20 MG tablet, Alternate 60 mg with 40 mg, Disp: 180 tablet, Rfl: 3   traZODone (DESYREL) 100 MG tablet, Take 50 mg by mouth at bedtime., Disp: , Rfl:  Allergies  Allergen Reactions   Ezetimibe Other (See Comments)    Myalgia   Gabapentin Itching and Other (See Comments)    Sore throat, breakouts   Statins Other (See Comments)    Myalgias (intolerance)     Social History   Socioeconomic History   Marital status: Married    Spouse name: Not on file   Number of children: Not on file   Years of education: Not on file   Highest education level: Not on file  Occupational History   Not on file  Tobacco Use   Smoking  status: Every Day    Current packs/day: 0.50    Average packs/day: 0.5 packs/day for 50.0 years (25.0 ttl pk-yrs)    Types: Cigarettes   Smokeless tobacco: Never  Vaping Use   Vaping status: Never Used  Substance and Sexual Activity   Alcohol use: Not Currently   Drug use: Never   Sexual activity: Not on file  Other Topics Concern   Not on file  Social History Narrative   Not on file   Social Determinants of Health   Financial Resource Strain: Low Risk  (05/22/2022)   Overall Financial Resource Strain (CARDIA)    Difficulty of Paying Living Expenses: Not very hard  Food Insecurity: No Food Insecurity (05/22/2022)   Hunger Vital Sign    Worried About Running Out of Food in the Last Year: Never true    Ran Out of Food in the Last Year: Never true  Transportation Needs: No Transportation Needs (05/22/2022)   PRAPARE - Transportation  Lack of Transportation (Medical): No    Lack of Transportation (Non-Medical): No  Physical Activity: Not on file  Stress: Not on file  Social Connections: Not on file  Intimate Partner Violence: Not on file    Physical Exam      Future Appointments  Date Time Provider Department Center  03/24/2023  3:40 PM [facility]  04/14/2023  2:00 PM [facility] PA/NP [facility] None  06/12/2023  9:30 AM [facility] None  06/13/2023  9:30 AM Weston Settle, MD [facility] None  06/16/2023 10:15 AM [place], Jenelle Mages, DPM [facility]  06/23/2023  3:40 PM [facility] DEVICE [facility]OTES [facility]  09/22/2023  3:40 PM [facility]

## 2023-03-20 ENCOUNTER — Other Ambulatory Visit (HOSPITAL_COMMUNITY): Payer: Self-pay

## 2023-03-20 MED ORDER — SPIRONOLACTONE 25 MG PO TABS: 25.0000 mg | ORAL_TABLET | Freq: Every day | ORAL | 1 refills | Status: DC

## 2023-03-21 ENCOUNTER — Encounter (HOSPITAL_COMMUNITY): Payer: Medicare Other

## 2023-03-24 ENCOUNTER — Ambulatory Visit: Payer: Medicare Other

## 2023-03-24 DIAGNOSIS — I420 Dilated cardiomyopathy: Secondary | ICD-10-CM

## 2023-03-24 DIAGNOSIS — I5022 Chronic systolic (congestive) heart failure: Secondary | ICD-10-CM | POA: Diagnosis not present

## 2023-03-26 ENCOUNTER — Other Ambulatory Visit (HOSPITAL_COMMUNITY): Payer: Self-pay

## 2023-03-26 NOTE — Progress Notes (Signed)
Paramedicine Encounter    Patient ID: Tommy Ward, male    DOB: Jun 29, 1941, 82 y.o.   MRN: [id number]   Complaints-no complaints   Assessment- CAOx4, warm and dry, ambulatory with no shortness of breath, no chest pain, no dizziness, no weight gain, no swelling, lungs clear.   Compliance with meds- no missed doses in the last week   Pill box filled- for one week   Refills needed- xtandi called into specialty pharmacy   Meds changes since last visit- none     Social changes- none    VISIT SUMMARY- Arrived for home visit for [facility] who reports to be feeling well today with no complaints. He denied chest pain, shortness of breath, dizziness, swelling or weight gain. Lungs clear. No swelling noted. Vitals obtained as noted and stable. Meds reviewed and confirmed- pill box filled for one week. Refills of Xtandi called in- will be delivered on 03/31/23. Appointments reviewed and confirmed. Home visit complete. I will see [name] in one week.   BP 122/70   Pulse 70   Resp 16   Wt 172 lb (78 kg)   SpO2 97%   BMI 25.40 kg/m  Weight yesterday-- DNW Last visit weight-- 171bs     ACTION: Home visit completed     Patient Care Team: Buckner Malta, MD as PCP - General (Family Medicine) Rennis Golden, Lisette Abu, MD as PCP - Cardiology (Cardiology) Lanier Prude, MD as PCP - Electrophysiology (Cardiology) Weston Settle, MD as Consulting Physician (Oncology) Dora Sims, DDS as Referring Physician (Oral Surgery) Debroah Baller, MD as Consulting Physician (Urology)  Patient Active Problem List   Diagnosis Date Noted   Vitamin D toxicity 07/24/2022   Anemia due to stage 4 chronic kidney disease (HCC) 03/20/2022   Hypercalcemia of malignancy 02/16/2021   Chronic systolic heart failure (HCC)    Abnormal stress test 12/05/2020   Depressed left ventricular ejection fraction 12/05/2020   Nonrheumatic mitral valve regurgitation 12/05/2020   Pre-diabetes 12/05/2020    Pulmonary hypertension (HCC) 12/05/2020   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HCC) 12/05/2020   Tobacco use 12/05/2020   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   HFrEF (heart failure with reduced ejection fraction) (HCC) 11/23/2020   CAD (coronary artery disease) 11/23/2020   GERD (gastroesophageal reflux disease)    Hyperlipidemia    Hypertension    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   Malignant neoplasm of prostate (HCC) 06/02/2020   AKI (acute kidney injury) (HCC) 11/25/2019   Hyperphosphatemia 11/25/2019   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   Hyperkalemia 08/24/2019   Metabolic bone disease 08/24/2019   Vitamin D deficiency 08/24/2019   Benign hypertension with chronic kidney disease, stage III (HCC) 05/20/2019   Decreased cardiac ejection fraction 05/20/2019   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) 05/20/2019   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 02/24/2019   Olecranon bursitis of left elbow 06/25/2018   B12 deficiency 03/22/2018   Gastroesophageal reflux disease without esophagitis 03/22/2018   Chronic renal insufficiency, stage III (moderate) (HCC) 11/27/2015   Aortic valve disorder 07/08/2014   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   Cataract 11/2013   Essential hypertension 07/06/2012   Coronary arteriosclerosis 07/06/2012   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   Left bundle branch block 07/06/2012   Mixed hyperlipidemia 07/06/2012   Cancer (HCC) 08/2008   S/P radiation  therapy > 12 wks ago 2010    Current Outpatient Medications:    aspirin EC 81 MG tablet, Take 1 tablet (81 mg total) by mouth daily., Disp: 90 tablet, Rfl: 3   Cetirizine HCl 10 MG CAPS, Take 1 capsule by mouth daily., Disp: , Rfl:    dapagliflozin propanediol (FARXIGA) 10 MG TABS  tablet, Take 1 tablet (10 mg total) by mouth daily before breakfast. Pharmacy note: please split bill Farxiga to [facility] RX BIN: [id number] RX PCN: [id number] RX GROUP: [id number] RX ID: [id number], Disp: 90 tablet, Rfl: 3   digoxin (LANOXIN) 0.125 MG tablet, Take 1 tablet (0.125 mg total) by mouth every other day., Disp: 45 tablet, Rfl: 3   enzalutamide (XTANDI) 40 MG tablet, Take 160 mg by mouth daily., Disp: , Rfl:    Evolocumab (REPATHA SURECLICK) 140 MG/ML SOAJ, INJECT 1 DOSE UNDER THE SKIN EVERY 14 DAYS, Disp: 6 mL, Rfl: 3   metoprolol succinate (TOPROL XL) 25 MG 24 hr tablet, Take 1 tablet (25 mg total) by mouth at bedtime., Disp: 90 tablet, Rfl: 3   Multiple Vitamins-Minerals (PRESERVISION/LUTEIN) CAPS, Take 1 capsule by mouth at bedtime., Disp: , Rfl:    omeprazole (PRILOSEC) 40 MG capsule, Take 40 mg by mouth daily., Disp: , Rfl:    pregabalin (LYRICA) 100 MG capsule, Take 100 mg by mouth 3 (three) times daily., Disp: , Rfl:    rosuvastatin (CRESTOR) 5 MG tablet, Take 5 mg by mouth daily., Disp: , Rfl:    spironolactone (ALDACTONE) 25 MG tablet, Take 1 tablet (25 mg total) by mouth daily., Disp: 90 tablet, Rfl: 1   tamsulosin (FLOMAX) 0.4 MG CAPS capsule, Take 1 capsule (0.4 mg total) by mouth daily., Disp: 30 capsule, Rfl: 0   torsemide (DEMADEX) 20 MG tablet, Alternate 60 mg with 40 mg, Disp: 180 tablet, Rfl: 3   traZODone (DESYREL) 100 MG tablet, Take 50 mg by mouth at bedtime., Disp: , Rfl:    cholestyramine light (PREVALITE) 4 g packet, Take 4 g by mouth 2 (two) times daily. (Patient not taking: Reported on 03/05/2023), Disp: , Rfl:  Allergies  Allergen Reactions   Ezetimibe Other (See Comments)    Myalgia   Gabapentin Itching and Other (See Comments)    Sore throat, breakouts   Statins Other (See Comments)    Myalgias (intolerance)     Social History   Socioeconomic History   Marital status: Married    Spouse name: Not on file   Number of children: Not on file   Years of  education: Not on file   Highest education level: Not on file  Occupational History   Not on file  Tobacco Use   Smoking status: Every Day    Current packs/day: 0.50    Average packs/day: 0.5 packs/day for 50.0 years (25.0 ttl pk-yrs)    Types: Cigarettes   Smokeless tobacco: Never  Vaping Use   Vaping status: Never Used  Substance and Sexual Activity   Alcohol use: Not Currently   Drug use: Never   Sexual activity: Not on file  Other Topics Concern   Not on file  Social History Narrative   Not on file   Social Determinants of Health   Financial Resource Strain: Low Risk  (05/22/2022)   Overall Financial Resource Strain (CARDIA)    Difficulty of Paying Living Expenses: Not very hard  Food Insecurity: No Food Insecurity (05/22/2022)   Hunger Vital Sign    Worried About Running Out of Food in  the Last Year: Never true    Ran Out of Food in the Last Year: Never true  Transportation Needs: No Transportation Needs (05/22/2022)   PRAPARE - Administrator, Civil Service (Medical): No    Lack of Transportation (Non-Medical): No  Physical Activity: Not on file  Stress: Not on file  Social Connections: Not on file  Intimate Partner Violence: Not on file    Physical Exam      Future Appointments  Date Time Provider Department Center  04/14/2023  2:00 PM [facility] PA/NP [facility] None  06/12/2023  9:30 AM [facility] None  06/13/2023  9:30 AM Weston Settle, MD [facility] None  06/16/2023 10:15 AM Standiford, Jenelle Mages, DPM [facility]  06/23/2023  3:40 PM [facility] DEVICE REMOTES [facility]  09/22/2023  3:40 PM [facility] DEVICE REMOTES [facility]

## 2023-04-02 ENCOUNTER — Telehealth (HOSPITAL_COMMUNITY): Payer: Self-pay | Admitting: Internal Medicine

## 2023-04-02 ENCOUNTER — Other Ambulatory Visit (HOSPITAL_COMMUNITY): Payer: Self-pay | Admitting: Emergency Medicine

## 2023-04-02 NOTE — Progress Notes (Signed)
Paramedicine Encounter    Patient ID: Tommy Ward, male    DOB: May 17, 1941, 82 y.o.   MRN: [id number]   Complaints - Increased "tightness" in extremities and abdomen  Assessment - +2 edema on left leg, Clear lung sounds.   Compliance with meds - missed 1 evening dose.   Pill box filled - for 2x weeks.   Refills needed - Tamsulosin.   Meds changes since last visit -     Social changes - none   VISIT SUMMARY**  Met with Tommy Ward in the home today. Pt was working outside in the garage and came in sweating from hard work in the noon day heat. Pt sat down and cooled off. Pt reported some potential muscle spasms noted in his upper left chest. I could visualize the twitching motion noted and presumed it might be due to his implanted device. Pt was noted to have an irregular pulse varying from strong to weak palpable pulses. Pt was noted to not have hx of prolonged A-fib and was further investigated by being placed on the monitor and EKG obtained. EKG was noted to have non-demand AV paced rhythm with frequent multifocal PVCs. Pt denied any chest pain, shortness of breath, or dizziness. Pt did report that he felt "tight" in his midsection, his hands, stating his ring was fitting tighter, and noted +2 edema in his left lower extremity. I reached out to triage and [place], [place] called me back and advised pt to be further evaluated in the ED today. Pt was adamant about not going to the hospital today, stating that he can be further evaluated at his doctor's appointment on 8/26. Pt was given resources if he were to change his mind. Pt's pill box was filled for 2x weeks. Pt was informed that I will be out of town next week, but H. Karleen Hampshire will follow up next week via telephone. And I will follow up with him in the clinic on 8/26.   There were no vitals taken for this visit. Weight yesterday - DNW Last visit weight - 172lb     ACTION: Home visit completed  [place] EMT-P Community Paramedic   [phone]     Patient Care Team: Buckner Malta, MD as PCP - General (Family Medicine) Rennis Golden Lisette Abu, MD as PCP - Cardiology (Cardiology) Lanier Prude, MD as PCP - Electrophysiology (Cardiology) Weston Settle, MD as Consulting Physician (Oncology) Dora Sims, DDS as Referring Physician (Oral Surgery) Debroah Baller, MD as Consulting Physician (Urology)  Patient Active Problem List   Diagnosis Date Noted   Vitamin D toxicity 07/24/2022   Anemia due to stage 4 chronic kidney disease (HCC) 03/20/2022   Hypercalcemia of malignancy 02/16/2021   Chronic systolic heart failure (HCC)    Abnormal stress test 12/05/2020   Depressed left ventricular ejection fraction 12/05/2020   Nonrheumatic mitral valve regurgitation 12/05/2020   Pre-diabetes 12/05/2020   Pulmonary hypertension (HCC) 12/05/2020   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HCC) 12/05/2020   Tobacco use 12/05/2020   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   HFrEF (heart failure with reduced ejection fraction) (HCC) 11/23/2020   CAD (coronary artery disease) 11/23/2020   GERD (gastroesophageal reflux disease)    Hyperlipidemia    Hypertension    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   Malignant neoplasm of prostate (HCC) 06/02/2020   AKI (acute kidney injury) (HCC) 11/25/2019   Hyperphosphatemia 11/25/2019   Anemia due to stage  3b chronic kidney disease (HCC) 08/24/2019   Hyperkalemia 08/24/2019   Metabolic bone disease 08/24/2019   Vitamin D deficiency 08/24/2019   Benign hypertension with chronic kidney disease, stage III (HCC) 05/20/2019   Decreased cardiac ejection fraction 05/20/2019   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) 05/20/2019   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 02/24/2019   Olecranon bursitis of left elbow 06/25/2018   B12  deficiency 03/22/2018   Gastroesophageal reflux disease without esophagitis 03/22/2018   Chronic renal insufficiency, stage III (moderate) (HCC) 11/27/2015   Aortic valve disorder 07/08/2014   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   Cataract 11/2013   Essential hypertension 07/06/2012   Coronary arteriosclerosis 07/06/2012   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   Left bundle branch block 07/06/2012   Mixed hyperlipidemia 07/06/2012   Cancer (HCC) 08/2008   S/P radiation therapy > 12 wks ago 2010    Current Outpatient Medications:    aspirin EC 81 MG tablet, Take 1 tablet (81 mg total) by mouth daily., Disp: 90 tablet, Rfl: 3   Cetirizine HCl 10 MG CAPS, Take 1 capsule by mouth daily., Disp: , Rfl:    cholestyramine light (PREVALITE) 4 g packet, Take 4 g by mouth 2 (two) times daily. (Patient not taking: Reported on 03/05/2023), Disp: , Rfl:    dapagliflozin propanediol (FARXIGA) 10 MG TABS tablet, Take 1 tablet (10 mg total) by mouth daily before breakfast. Pharmacy note: please split bill Farxiga to [facility] RX BIN: [id number] RX PCN: [id number] RX GROUP: [id number] RX ID: [id number], Disp: 90 tablet, Rfl: 3   digoxin (LANOXIN) 0.125 MG tablet, Take 1 tablet (0.125 mg total) by mouth every other day., Disp: 45 tablet, Rfl: 3   enzalutamide (XTANDI) 40 MG tablet, Take 160 mg by mouth daily., Disp: , Rfl:    Evolocumab (REPATHA SURECLICK) 140 MG/ML SOAJ, INJECT 1 DOSE UNDER THE SKIN EVERY 14 DAYS, Disp: 6 mL, Rfl: 3   metoprolol succinate (TOPROL XL) 25 MG 24 hr tablet, Take 1 tablet (25 mg total) by mouth at bedtime., Disp: 90 tablet, Rfl: 3   Multiple Vitamins-Minerals (PRESERVISION/LUTEIN) CAPS, Take 1 capsule by mouth at bedtime., Disp: , Rfl:    omeprazole (PRILOSEC) 40 MG capsule, Take 40 mg by mouth daily., Disp: , Rfl:    pregabalin (LYRICA) 100 MG capsule, Take 100 mg by mouth 3 (three) times daily., Disp: , Rfl:    rosuvastatin (CRESTOR) 5 MG tablet, Take 5  mg by mouth daily., Disp: , Rfl:    spironolactone (ALDACTONE) 25 MG tablet, Take 1 tablet (25 mg total) by mouth daily., Disp: 90 tablet, Rfl: 1   tamsulosin (FLOMAX) 0.4 MG CAPS capsule, Take 1 capsule (0.4 mg total) by mouth daily., Disp: 30 capsule, Rfl: 0   torsemide (DEMADEX) 20 MG tablet, Alternate 60 mg with 40 mg, Disp: 180 tablet, Rfl: 3   traZODone (DESYREL) 100 MG tablet, Take 50 mg by mouth at bedtime., Disp: , Rfl:  Allergies  Allergen Reactions   Ezetimibe Other (See Comments)    Myalgia   Gabapentin Itching and Other (See Comments)    Sore throat, breakouts   Statins Other (See Comments)    Myalgias (intolerance)     Social History   Socioeconomic History   Marital status: Married    Spouse name: Not on file   Number of children: Not on file   Years of education: Not on file   Highest education  level: Not on file  Occupational History   Not on file  Tobacco Use   Smoking status: Every Day    Current packs/day: 0.50    Average packs/day: 0.5 packs/day for 50.0 years (25.0 ttl pk-yrs)    Types: Cigarettes   Smokeless tobacco: Never  Vaping Use   Vaping status: Never Used  Substance and Sexual Activity   Alcohol use: Not Currently   Drug use: Never   Sexual activity: Not on file  Other Topics Concern   Not on file  Social History Narrative   Not on file   Social Determinants of Health   Financial Resource Strain: Low Risk  (05/22/2022)   Overall Financial Resource Strain (CARDIA)    Difficulty of Paying Living Expenses: Not very hard  Food Insecurity: No Food Insecurity (05/22/2022)   Hunger Vital Sign    Worried About Running Out of Food in the Last Year: Never true    Ran Out of Food in the Last Year: Never true  Transportation Needs: No Transportation Needs (05/22/2022)   PRAPARE - Administrator, Civil Service (Medical): No    Lack of Transportation (Non-Medical): No  Physical Activity: Not on file  Stress: Not on file  Social  Connections: Not on file  Intimate Partner Violence: Not on file    Physical Exam      Future Appointments  Date Time Provider Department Center  04/14/2023  2:00 PM [facility] PA/NP [facility] None  06/12/2023  9:30 AM [facility] None  06/13/2023  9:30 AM Weston Settle, MD [facility] None  06/16/2023 10:15 AM Standiford, Jenelle Mages, DPM [facility]  06/23/2023  3:40 PM [facility] DEVICE REMOTES [facility]  09/22/2023  3:40 PM [facility] DEVICE REMOTES [facility]

## 2023-04-02 NOTE — Telephone Encounter (Signed)
Received message from paramedicine team while doing a home visit. Patient not feeling well after coming out from his garage. His chest is beating really hard (visibly). Diaphoretic. Has fluid on his legs. EKG/monitor with frequent PVCs. Discussed with paramedicine team personally. Advise going to the ED for further evaluation.

## 2023-04-07 ENCOUNTER — Telehealth (HOSPITAL_COMMUNITY): Payer: Self-pay

## 2023-04-07 NOTE — Telephone Encounter (Signed)
[facility] [place] needs a new refill for Tamsulosin for [place] sent in. I will forward to HF clinic triage as they we're the last to send in refill on same.   [name], EMT-Paramedic [phone] 04/07/2023

## 2023-04-07 NOTE — Telephone Encounter (Signed)
Spoke to [place] and his wife [name] this morning to check in and see how Bishop has been feeling over the weekend. He reports feeling okay with no episodes with his device and having no shortness of breath, dizziness or chest pain. He reports his weight is 172. He denied any increased swelling since last week. He has been compliant with his medications and has no concerns at present. He knows to follow up in the ER if any arise and to reach out to me. He agreed with plan. Call complete.   [name], EMT-Paramedic [phone] 04/07/2023

## 2023-04-08 MED ORDER — TAMSULOSIN HCL 0.4 MG PO CAPS: 0.4000 mg | ORAL_CAPSULE | Freq: Every day | ORAL | 0 refills | Status: DC

## 2023-04-08 NOTE — Progress Notes (Signed)
Remote ICD transmission.   

## 2023-04-08 NOTE — Addendum Note (Signed)
Addended by: Theresia Bough on: 04/08/2023 11:38 AM   Modules accepted: Orders

## 2023-04-08 NOTE — Telephone Encounter (Signed)
Meds refilled  Additional refill should come from PCP/Urology

## 2023-04-08 NOTE — Progress Notes (Addendum)
PCP: Buckner Malta, MD Cardiology: Dr. Tomie China HF Cardiology: Dr. Shirlee Latch  82 y.o. with history of prostate cancer, CKD stage 3, CAD, and ischemic cardiomyopathy was referred by Dr. Tomie China for evaluation of CHF.  Patient developed gradually worsening exertional dyspnea starting early this year.  The shortness of breath has been especially bad for about 1.5 months.  He had an echo done in 4/22, showing EF < 20%, moderate-severe LV dilation, normal RV, severe MR.  LHC/RHC was done showing low cardiac index at 1.81 and occluded mid LAD.  No intervention.  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   TEE was done 6/22, showing EF < 20% with septal-lateral dyssynchrony, mildly decreased RV systolic function, moderate TR, moderate central MR with ERO 0.2 cm^2.  Echo in 7/22 similarly showed EF 20%, normal RV, moderate MR.  Patient had St Jude CRT-D device implanted.  Echo in 1/23 showed EF <20% with moderate LV dilation, mildly decreased RV systolic function, normal IVC.   Echo in 9/23 showed EF 20-25%, mild MR.   Had an episode 04/02/23 of not feeling well, having palpitations, increased dyspnea and LEE. ECG, via paramedic, showed frequent PVCs. Advised evaluation in ED, however patient declined.  Today he returns for HF follow up with his wife and paramedic, [name]. Overall feeling fine. He is not very active, but no SOB with walking on flat ground. 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 No falls. Denies palpitations, CP, dizziness, edema, or PND/Orthopnea. Appetite ok. No fever or chills. Weight at home 173 pounds. Taking all medications.   ECG (personally reviewed): AV paced, 1 PVC  ReDs: 42%  Device interrogation (personally reviewed): 94% BiV pacing, no VT or AF, 4.3 % PVC burden  Labs (5/22): K 3.4, creatinine 1.7 => 2.15, AST 123 => 189, ALT 322 =>190, tbili 1.7, Hgb 12, LDL 38 Labs (6/22): Viral  hepatitis serologies negative Labs (8/22): K 4.2, creatinine 1.87 Labs (10/22): K 3.7, creatinine 2 Labs (11/22): LDL 159 Labs (12/22): K 3.7, creatinine 2 Labs (4/23): K 4.4, creatinine 2.7, LFTs normal Labs (7/23): K 4.4, creatinine 2.35 Labs (10/23): K 4.0, creatinine 2.4, LFTs normal Labs (2/24): K 4.9, creatinine 2.15, LDL 46, TGs 226 Labs (6/24): K 3.9, creatinine 2.19  PMH:  1. Hyperlipidemia 2. HTN 3. CKD stage 3 4. 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  Treated with radiation and currently on denosumab.   5. Cholecystectomy 6. CAD: LHC in 4/22 with 80% proximal RCA stenosis (nondominant), totally occluded mLAD with collaterals, 60% D1.  No intervention.  7. Chronic systolic CHF: Ischemic cardiomyopathy.  St Jude CRT-D.  - Echo (4/22) with EF < 20%, moderate-severe LV dilation, normal RV, severe LAE, severe MR.  - RHC (4/22): mean RA 3, PA 39/10, mean PCWP 12, CI 1.81 - Echo (6/22): EF < 20% with septal-lateral dyssynchrony, mildly decreased RV systolic function, moderate TR, moderate central MR with ERO 0.2 cm^2.  - Echo (7/22): EF < 20%, mild LVH, normal RV, moderate MR.  - Echo (1/23: EF < 20% with moderate LV dilation, mildly decreased RV systolic function, normal IVC, mild-moderate MR. 8. Mitral regurgitation: Severe on 4/22 echo.  - TEE (6/22) with moderate central MR with ERO 0.2 cm^2 (functional, annular dilatation).  - Echo (1/23) with mild-moderate MR.  - Echo (9/23): EF 20-25%, mild MR.  9. Elevated LFTs: Viral hepatitis labs negative.  RUQ [place] (6/22) was not suggestive of cirrhosis, ascites noted.  10.  Hypercalcemia: Due to Ca supplementation in setting of renal dysfunction.  11. Chronic LBBB  Social History   Socioeconomic History   Marital status: Married    Spouse name: Not on file   Number of children: Not on file   Years of education: Not on file   Highest education level: Not on file  Occupational History   Not on file  Tobacco Use   Smoking status:  Every Day    Current packs/day: 0.50    Average packs/day: 0.5 packs/day for 50.0 years (25.0 ttl pk-yrs)    Types: Cigarettes   Smokeless tobacco: Never  Vaping Use   Vaping status: Never Used  Substance and Sexual Activity   Alcohol use: Not Currently   Drug use: Never   Sexual activity: Not on file  Other Topics Concern   Not on file  Social History Narrative   Not on file   Social Determinants of Health   Financial Resource Strain: Low Risk  (05/22/2022)   Overall Financial Resource Strain (CARDIA)    Difficulty of Paying Living Expenses: Not very hard  Food Insecurity: No Food Insecurity (05/22/2022)   Hunger Vital Sign    Worried About Running Out of Food in the Last Year: Never true    Ran Out of Food in the Last Year: Never true  Transportation Needs: No Transportation Needs (05/22/2022)   PRAPARE - Administrator, Civil Service (Medical): No    Lack of Transportation (Non-Medical): No  Physical Activity: Not on file  Stress: Not on file  Social Connections: Not on file  Intimate Partner Violence: Not on file   Family History  Problem Relation Age of Onset   Leukemia Mother    Prostate cancer Father    Heart attack Brother    Colon cancer Neg Hx    Rectal cancer Neg Hx    Stomach cancer Neg Hx    Esophageal cancer Neg Hx    ROS: All systems reviewed and negative except as per HPI.   Current Outpatient Medications  Medication Sig Dispense Refill   aspirin EC 81 MG tablet Take 1 tablet (81 mg total) by mouth daily. 90 tablet 3   Cetirizine HCl 10 MG CAPS Take 1 capsule by mouth daily.     cholestyramine light (PREVALITE) 4 g packet Take 4 g by mouth 2 (two) times daily.     dapagliflozin propanediol (FARXIGA) 10 MG TABS tablet Take 1 tablet (10 mg total) by mouth daily before breakfast. Pharmacy note: please split bill Farxiga to [facility] RX BIN: [id number] RX PCN: [id number] RX GROUP: [id number] RX ID: [id number] 90 tablet 3   digoxin (LANOXIN) 0.125 MG  tablet Take 1 tablet (0.125 mg total) by mouth every other day. 45 tablet 3   enzalutamide (XTANDI) 40 MG tablet Take 160 mg by mouth daily.     Evolocumab (REPATHA SURECLICK) 140 MG/ML SOAJ INJECT 1 DOSE UNDER THE SKIN EVERY 14 DAYS 6 mL 3   metoprolol succinate (TOPROL XL) 25 MG 24 hr tablet Take 1 tablet (25 mg total) by mouth at bedtime. 90 tablet 3   Multiple Vitamins-Minerals (PRESERVISION/LUTEIN) CAPS Take 1 capsule by mouth at bedtime.     omeprazole (PRILOSEC) 40 MG capsule Take 40 mg by mouth daily.     pregabalin (LYRICA) 100 MG capsule Take 100 mg by mouth 3 (three) times daily.     rosuvastatin (CRESTOR) 5 MG tablet Take 5 mg by mouth daily.     spironolactone (  ALDACTONE) 25 MG tablet Take 1 tablet (25 mg total) by mouth daily. 90 tablet 1   tamsulosin (FLOMAX) 0.4 MG CAPS capsule Take 1 capsule (0.4 mg total) by mouth daily. 90 capsule 0   torsemide (DEMADEX) 20 MG tablet Alternate 60 mg with 40 mg 180 tablet 3   traZODone (DESYREL) 100 MG tablet Take 50 mg by mouth at bedtime.     No current facility-administered medications for this encounter.   Wt Readings from Last 3 Encounters:  04/14/23 79.6 kg (175 lb 6.4 oz)  04/02/23 78 kg (172 lb)  03/26/23 78 kg (172 lb)   BP 122/68   Pulse 70   Wt 79.6 kg (175 lb 6.4 oz)   SpO2 97%   BMI 25.90 kg/m  Physical Exam General:  NAD. No resp difficulty, walked into clinic HEENT: Normal Neck: Supple. JVP 810. Carotids 2+ bilat; no bruits. No lymphadenopathy or thryomegaly appreciated. Cor: PMI nondisplaced. Regular rate & rhythm. No rubs, gallops or murmurs. Lungs: Clear Abdomen: Soft, nontender, nondistended. No hepatosplenomegaly. No bruits or masses. Good bowel sounds. Extremities: No cyanosis, clubbing, rash, edema Neuro: Alert & oriented x 3, cranial nerves grossly intact. Moves all 4 extremities w/o difficulty. Affect pleasant.  Assessment/Plan: 1. CAD: Occluded mid LAD and 80% proximal stenosis in nondominant RCA in  4/22.  No intervention.  No chest pain.  - Continue ASA 81 mg daily.  - Continue rosuvastatin, Repatha.  Good lipids 2/24.  2. Chronic systolic CHF: Ischemic cardiomyopathy.  Echo in 4/22 with EF < 20%, moderate-severe LV dilation, normal RV, severe LAE, severe MR.  RHC in 4/22 with CI 1.8.  TEE in 6/22 with EF <20%, dyssynchrony, mildly decreased RV systolic function.  Echo in 7/22 with EF < 20%, moderate MR.  St Jude CRT-D device placed.  Echo in 1/23 showed EF < 20%, mild RV dysfunction.  Echo in 9/23 showed EF 20-25%, mild MR. No change to echo since CRT but he has felt better symptomatically.  Worse NYHA class IIb, although functional class confounded by physical inactivity. He is volume overloaded on exam, ReDs 42%.  GDMT has been limited by renal function (CKD stage 3b). - Increase torsemide to 80 mg daily, add 20 KCL daily. BMET/BNP today, repeat BMET in 10-14 days. - Continue Toprol XL 25 mg daily. - Continue Farxiga 10 mg daily.     - Continue spironolactone 25 mg daily.   - Continue digoxin 0.125 qod.  Check digoxin level today.    - Off Entresto and losartan with CKD and soft BP.     3. Mitral regurgitation: Severe on 5/22 TTE. However, TEE in 6/22 showed moderate functional central MR.   Mild-moderate MR on echo 1/23 post-CRT, mild MR on 9/23 echo.  4. Elevated LFTs: Viral hepatitis labs negative.  Abdominal US did not show cirrhosis but did show ascites.  Etiology uncertain, ? congestive hepatopathy.  5. CKD stage 3b: Continue SGLT2i. BMET today.  6. Smoking: He quit in 7/23.  Follow up in 4-6 weeks with APP for fluid check and 4 months with Dr. Shirlee Latch.  Anderson Malta [facility] FNP-BC 04/14/2023

## 2023-04-14 ENCOUNTER — Other Ambulatory Visit (HOSPITAL_COMMUNITY): Payer: Self-pay | Admitting: Emergency Medicine

## 2023-04-14 ENCOUNTER — Encounter (HOSPITAL_COMMUNITY): Payer: Self-pay

## 2023-04-14 ENCOUNTER — Ambulatory Visit (HOSPITAL_COMMUNITY)
Admission: RE | Admit: 2023-04-14 | Discharge: 2023-04-14 | Disposition: A | Discharge status: Home / Self Care | Payer: Medicare Other | Source: Ambulatory Visit | Attending: Family Medicine | Admitting: Family Medicine

## 2023-04-14 VITALS — BP 122/68 | HR 70 | Wt 175.4 lb

## 2023-04-14 DIAGNOSIS — C7951 Secondary malignant neoplasm of bone: Secondary | ICD-10-CM | POA: Diagnosis not present

## 2023-04-14 DIAGNOSIS — N1832 Chronic kidney disease, stage 3b: Secondary | ICD-10-CM | POA: Insufficient documentation

## 2023-04-14 DIAGNOSIS — I5022 Chronic systolic (congestive) heart failure: Secondary | ICD-10-CM | POA: Insufficient documentation

## 2023-04-14 DIAGNOSIS — I130 Hypertensive heart and chronic kidney disease with heart failure and stage 1 through stage 4 chronic kidney disease, or unspecified chronic kidney disease: Secondary | ICD-10-CM | POA: Insufficient documentation

## 2023-04-14 DIAGNOSIS — I255 Ischemic cardiomyopathy: Secondary | ICD-10-CM | POA: Insufficient documentation

## 2023-04-14 DIAGNOSIS — Z79899 Other long term (current) drug therapy: Secondary | ICD-10-CM | POA: Insufficient documentation

## 2023-04-14 DIAGNOSIS — Z720 Tobacco use: Secondary | ICD-10-CM

## 2023-04-14 DIAGNOSIS — I2510 Atherosclerotic heart disease of native coronary artery without angina pectoris: Secondary | ICD-10-CM | POA: Insufficient documentation

## 2023-04-14 DIAGNOSIS — R7989 Other specified abnormal findings of blood chemistry: Secondary | ICD-10-CM | POA: Insufficient documentation

## 2023-04-14 DIAGNOSIS — R188 Other ascites: Secondary | ICD-10-CM | POA: Diagnosis not present

## 2023-04-14 DIAGNOSIS — I340 Nonrheumatic mitral (valve) insufficiency: Secondary | ICD-10-CM | POA: Insufficient documentation

## 2023-04-14 DIAGNOSIS — Z7982 Long term (current) use of aspirin: Secondary | ICD-10-CM | POA: Insufficient documentation

## 2023-04-14 DIAGNOSIS — N183 Chronic kidney disease, stage 3 unspecified: Secondary | ICD-10-CM

## 2023-04-14 DIAGNOSIS — C61 Malignant neoplasm of prostate: Secondary | ICD-10-CM | POA: Insufficient documentation

## 2023-04-14 LAB — BASIC METABOLIC PANEL
Anion gap: 9 (ref 5–15)
BUN: 30 mg/dL — ABNORMAL HIGH (ref 8–23)
CO2: 29 mmol/L (ref 22–32)
Calcium: 9.5 mg/dL (ref 8.9–10.3)
Chloride: 99 mmol/L (ref 98–111)
Creatinine, Ser: 1.96 mg/dL — ABNORMAL HIGH (ref 0.61–1.24)
GFR, Estimated: 34 mL/min — ABNORMAL LOW (ref >60)
Glucose, Bld: 120 mg/dL — ABNORMAL HIGH (ref 70–99)
Potassium: 4 mmol/L (ref 3.5–5.1)
Sodium: 137 mmol/L (ref 135–145)

## 2023-04-14 LAB — DIGOXIN LEVEL: Digoxin Level: 0.9 ng/mL (ref 0.8–2.0)

## 2023-04-14 LAB — BRAIN NATRIURETIC PEPTIDE: B Natriuretic Peptide: 799.3 pg/mL — ABNORMAL HIGH (ref 0.0–100.0)

## 2023-04-14 MED ORDER — TORSEMIDE 20 MG PO TABS: 80.0000 mg | ORAL_TABLET | Freq: Every day | ORAL | 2 refills | Status: DC

## 2023-04-14 MED ORDER — POTASSIUM CHLORIDE CRYS ER 20 MEQ PO TBCR: 20.0000 meq | EXTENDED_RELEASE_TABLET | Freq: Every day | ORAL | 3 refills | Status: DC

## 2023-04-14 NOTE — Addendum Note (Signed)
Encounter addended by: Jacklynn Ganong, FNP on: 04/14/2023 2:27 PM  Actions taken: Clinical Note Signed

## 2023-04-14 NOTE — Addendum Note (Signed)
Encounter addended by: Baird Cancer, RN on: 04/14/2023 2:42 PM  Actions taken: Order Reconciliation Section accessed, Medication note saved, Medication List reviewed, Home Medications modified

## 2023-04-14 NOTE — Progress Notes (Signed)
ReDS Vest / Clip - 04/14/23 1400       ReDS Vest / Clip   Station Marker C    Ruler Value 29    ReDS Value Range High volume overload    ReDS Actual Value 42

## 2023-04-14 NOTE — Addendum Note (Signed)
Encounter addended by: Baird Cancer, RN on: 04/14/2023 2:26 PM  Actions taken: Clinical Note Signed, Order list changed, Diagnosis association updated

## 2023-04-14 NOTE — Progress Notes (Signed)
Paramedicine Encounter   Patient ID: Tommy Ward , male,   DOB: [birth year],82 y.o.,  MRN: [id number]   Met patient in clinic today with Prince Rome, NP. Reviewed last home visit with symptomatic PVCs. Shanda Bumps was concerned with increased fluid and increased his torsemide with the addition of potassium as noted. Pt denied any further events of symptoms and reported continued same level of exertion. Pt's wife did advise that pt had been continuing to consume large quantities of fluid in coffee/water, more than his 64 oz goal. Pt was re-educated on his goal fluid and why it is set at same. Pt denied any other heart failure symptoms at this time. Pill box was filled for 1 week, Torsemide dose change made and Wife was informed of  Potassium dose to be added. Will follow up to ensure that addition has been made. Will follow up in home in 1x week.    Weight @ clinic -175lbs B/P-122/68 P-70 SP02-97% Reds - 42%  Med changes- Increased torsemide to 80mg  every day + of potassium every day.  Benson Setting EMT-P Community Paramedic  [phone]

## 2023-04-14 NOTE — Patient Instructions (Addendum)
Medication Changes:  INCREASE YOUR TORSEMIDE TO 80MG  ONCE DAILY   START: POTASSIUM ONCE DAILY   Lab Work:  Labs done today, your results will be available in MyChart, we will contact you for abnormal readings.  THEN RETURN IN 10-14 DAYS AS SCHEDULED OR GO TO [facility] IN [place]- ORDERS GIVEN  Follow-Up in: 4-6 WEEKS WITH APP AS SCHEDULED   THEN 4 MONTHS WITH DR. Shirlee Latch AS SCHEDULED   At the [facility], you and your health needs are our priority. We have a designated team specialized in the treatment of Heart Failure. This Care Team includes your primary Heart Failure Specialized Cardiologist (physician), Advanced Practice Providers (APPs- Physician Assistants and Nurse Practitioners), and Pharmacist who all work together to provide you with the care you need, when you need it.   You may see any of the following providers on your designated Care Team at your next follow up:  Dr. Arvilla Meres Dr. Marca Ancona Dr. Marcos Eke, NP Robbie Lis, [place] [facility], [place] Brynda Peon, NP Karle Plumber, PharmD   Please be sure to bring in all your medications bottles to every appointment.   Need to Contact us:  If you have any questions or concerns before your next appointment please send [place] a message through [place] or call our office at [phone].    TO LEAVE A MESSAGE FOR THE NURSE SELECT OPTION 2, PLEASE LEAVE A MESSAGE INCLUDING: YOUR NAME DATE OF BIRTH CALL BACK NUMBER REASON FOR CALL**this is important as we prioritize the call backs  YOU WILL RECEIVE A CALL BACK THE SAME DAY AS LONG AS YOU CALL BEFORE 4:00 PM

## 2023-04-14 NOTE — Addendum Note (Signed)
Encounter addended by: Baird Cancer, RN on: 04/14/2023 2:32 PM  Actions taken: Pharmacy for encounter modified, Order list changed

## 2023-04-22 ENCOUNTER — Other Ambulatory Visit (HOSPITAL_COMMUNITY): Payer: Self-pay | Admitting: Emergency Medicine

## 2023-04-22 NOTE — Progress Notes (Signed)
Paramedicine Encounter    Patient ID: Tommy Ward, male    DOB: 1941-01-12, 82 y.o.   MRN: [id number]   Complaints - none, denied shortness of breath, chest pain, or dizziness.   Assessment - Lung sounds clear, no edema noted. Pt reported watch and rings felt looser   Compliance with meds - no missed doses  Pill box filled - fpr 1x week  Refills needed - xtandi  Meds changes since last visit - added potassium and 80 mg torsemide    Social changes - none   VISIT SUMMARY**  Met with Tommy Ward in the home today. Pt reported improvement in his fluid levels since his visit last week. Pt reported that he had been taking the torsemide since the visit, however just got the potassium and had only had 3x doses of same. Pt reported some cramping like sensation in his hand, but expressed that he thought it was from not taking the potassium. Pt was advised to keep an eye on it and reach out if it continues. Pt reports continued and increased urination. Pt reports that he has been able to continue his level of activity with no further symptoms. Pt's box was filled for 1x week. Will follow up in 1x week.   BP 126/70   Pulse 80   Resp 16   Wt 172 lb (78 kg)   BMI 25.40 kg/m  Weight yesterday-DNW Last visit weight-175 lbs    Benson Setting EMT-P Community Paramedic  [phone]    ACTION: Home visit completed     Patient Care Team: Buckner Malta, MD as PCP - General (Family Medicine) Rennis Golden Lisette Abu, MD as PCP - Cardiology (Cardiology) Lanier Prude, MD as PCP - Electrophysiology (Cardiology) Weston Settle, MD as Consulting Physician (Oncology) Dora Sims, DDS as Referring Physician (Oral Surgery) Debroah Baller, MD as Consulting Physician (Urology)  Patient Active Problem List   Diagnosis Date Noted   Vitamin D toxicity 07/24/2022   Anemia due to stage 4 chronic kidney disease (HCC) 03/20/2022   Hypercalcemia of malignancy 02/16/2021   Chronic  systolic heart failure (HCC)    Abnormal stress test 12/05/2020   Depressed left ventricular ejection fraction 12/05/2020   Nonrheumatic mitral valve regurgitation 12/05/2020   Pre-diabetes 12/05/2020   Pulmonary hypertension (HCC) 12/05/2020   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HCC) 12/05/2020   Tobacco use 12/05/2020   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   HFrEF (heart failure with reduced ejection fraction) (HCC) 11/23/2020   CAD (coronary artery disease) 11/23/2020   GERD (gastroesophageal reflux disease)    Hyperlipidemia    Hypertension    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   Malignant neoplasm of prostate (HCC) 06/02/2020   AKI (acute kidney injury) (HCC) 11/25/2019   Hyperphosphatemia 11/25/2019   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   Hyperkalemia 08/24/2019   Metabolic bone disease 08/24/2019   Vitamin D deficiency 08/24/2019   Benign hypertension with chronic kidney disease, stage III (HCC) 05/20/2019   Decreased cardiac ejection fraction 05/20/2019   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) 05/20/2019   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 02/24/2019   Olecranon bursitis of left elbow 06/25/2018   B12 deficiency 03/22/2018   Gastroesophageal reflux disease without esophagitis 03/22/2018   Chronic renal insufficiency, stage III (moderate) (HCC) 11/27/2015   Aortic valve disorder 07/08/2014   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 To be completed by 04/25/23 at noon - RX by Monia Pouch, DDS, Disp: , Rfl:    aspirin EC 81 MG tablet, Take 1 tablet (81 mg total) by mouth daily., Disp: 90 tablet, Rfl: 3   Cetirizine HCl 10 MG CAPS, Take 1 capsule by mouth daily., Disp: , Rfl:    cholestyramine light (PREVALITE) 4 g packet, Take 4 g by mouth 2 (two) times daily as needed., Disp: , Rfl:    dapagliflozin propanediol (FARXIGA) 10 MG TABS tablet, Take 1 tablet (10 mg total) by mouth daily before breakfast. Pharmacy note: please split bill Farxiga to [facility] RX BIN: [id number] RX PCN: [id number] RX GROUP: [id number] RX ID: [id number], Disp: 90 tablet, Rfl: 3   digoxin (LANOXIN) 0.125 MG tablet, Take 1 tablet (0.125 mg total) by mouth every other day., Disp: 45 tablet, Rfl: 3   enzalutamide (XTANDI) 40 MG tablet, Take 160 mg by mouth daily., Disp: , Rfl:    Evolocumab (REPATHA SURECLICK) 140 MG/ML SOAJ, INJECT 1 DOSE UNDER THE SKIN EVERY 14 DAYS, Disp: 6 mL, Rfl: 3   FISH OIL-KRILL OIL PO, Take 500 mg by mouth daily., Disp: , Rfl:    metoprolol succinate (TOPROL XL) 25 MG 24 hr tablet, Take 1 tablet (25 mg total) by mouth at bedtime., Disp: 90 tablet, Rfl: 3   Multiple Vitamins-Minerals (PRESERVISION/LUTEIN) CAPS, Take 1 capsule by mouth at bedtime., Disp: , Rfl:    omeprazole (PRILOSEC) 40 MG capsule, Take 40 mg by mouth daily., Disp: , Rfl:    potassium chloride SA (KLOR-CON M) 20 MEQ tablet, Take 1 tablet (20 mEq total) by mouth daily., Disp: 30 tablet, Rfl: 3   pregabalin (LYRICA) 100 MG capsule, Take 100 mg by mouth 3 (three) times daily., Disp: , Rfl:    rosuvastatin (CRESTOR) 5 MG tablet, Take 5 mg by mouth daily., Disp: , Rfl:    spironolactone (ALDACTONE) 25 MG tablet, Take 1 tablet (25 mg total) by mouth daily., Disp: 90 tablet, Rfl: 1   tamsulosin (FLOMAX) 0.4 MG CAPS capsule, Take 1 capsule (0.4 mg total) by mouth daily., Disp: 90 capsule, Rfl: 0   torsemide (DEMADEX) 20  MG tablet, Take 4 tablets (80 mg total) by mouth daily., Disp: 120 tablet, Rfl: 2   traZODone (DESYREL) 100 MG tablet, Take 50 mg by mouth at bedtime., Disp: , Rfl:  Allergies  Allergen Reactions   Ezetimibe Other (See Comments)    Myalgia   Gabapentin Itching and Other (See Comments)    Sore throat, breakouts   Statins Other (See Comments)    Myalgias (intolerance)     Social History   Socioeconomic History   Marital status: Married    Spouse name: Not on file   Number of children: Not on file   Years of education: Not on file   Highest education level: Not on file  Occupational History   Not on file  Tobacco Use   Smoking status: Every Day    Current packs/day: 0.50    Average packs/day: 0.5 packs/day for 50.0 years (  25.0 ttl pk-yrs)    Types: Cigarettes   Smokeless tobacco: Never  Vaping Use   Vaping status: Never Used  Substance and Sexual Activity   Alcohol use: Not Currently   Drug use: Never   Sexual activity: Not on file  Other Topics Concern   Not on file  Social History Narrative   Not on file   Social Determinants of Health   Financial Resource Strain: Low Risk  (05/22/2022)   Overall Financial Resource Strain (CARDIA)    Difficulty of Paying Living Expenses: Not very hard  Food Insecurity: No Food Insecurity (05/22/2022)   Hunger Vital Sign    Worried About Running Out of Food in the Last Year: Never true    Ran Out of Food in the Last Year: Never true  Transportation Needs: No Transportation Needs (05/22/2022)   PRAPARE - Administrator, Civil Service (Medical): No    Lack of Transportation (Non-Medical): No  Physical Activity: Not on file  Stress: Not on file  Social Connections: Not on file  Intimate Partner Violence: Not on file    Physical Exam      Future Appointments  Date Time Provider Department Center  05/26/2023 11:30 AM [facility] PA/NP [facility] None  06/12/2023  9:30 AM [facility] None  06/13/2023  9:30 AM  Weston Settle, MD [facility] None  06/16/2023 10:15 AM [place], Jenelle Mages, DPM [facility]  06/23/2023  3:40 PM [facility] DEVICE REMOTES [facility]  08/04/2023  2:00 PM Laurey Morale, MD [facility] None  09/22/2023  3:40 PM [facility] DEVICE REMOTES [facility]  12/22/2023  3:40 PM [facility] DEVICE REMOTES [facility]  03/22/2024  3:40 PM [facility] DEVICE REMOTES [facility]  06/21/2024  3:40 PM [facility] DEVICE REMOTES [facility]  09/20/2024  3:40 PM [facility] DEVICE REMOTES [facility]  12/20/2024  3:40 PM [facility] DEVICE REMOTES [facility]  03/21/2025  3:40 PM [facility] DEVICE REMOTES [facility]  06/20/2025  3:40 PM [facility] DEVICE REMOTES [facility]  09/19/2025  3:40 PM [facility] DEVICE REMOTES [facility]

## 2023-04-23 ENCOUNTER — Telehealth (HOSPITAL_COMMUNITY): Payer: Self-pay

## 2023-04-23 NOTE — Telephone Encounter (Signed)
Xtandi called in for refill through patient assistance with  [facility]- [phone]  Expected delivery on Sept. 6th.   Call complete.   [name], EMT-Paramedic [phone] 04/23/2023

## 2023-04-30 ENCOUNTER — Other Ambulatory Visit (HOSPITAL_COMMUNITY): Payer: Self-pay | Admitting: Emergency Medicine

## 2023-04-30 NOTE — Progress Notes (Signed)
Paramedicine Encounter    Patient ID: Tommy Ward, male    DOB: October 28, 1940, 82 y.o.   MRN: [id number]   Complaints - pain in legs   Assessment - continued decreased swelling, no edema noted in legs, looser fitting clothes in mid section. Pt reports ring and watch fitting looser. Lung sounds clear.  Compliance with meds -no missed medications    Pill box filled - for 1x week   Refills needed - pregabalin   Meds changes since last visit - none     Social changes -  none     VISIT SUMMARY** Met with Tommy Ward in the home. Pt reported feeling well and completing his ADLs without difficulty. Pt denied any shortness of breath, chest pain, palpitations, or dizziness. Pt was assessed as noted to find no acute abnormalities. Pt's wife did advise some increased leg/neuropathy pain. Pt did not feel it necessary to take any action on his legs at this time. Pt's pill box was filled for 1 week. Upcoming appointments reviewed. Will follow up in the home in 1x week.    BP 122/80   Pulse 76   Resp 16   Wt 162 lb 12.8 oz (73.8 kg)   SpO2 97%   BMI 24.04 kg/m  Weight yesterday-172 lbs Last visit weight-172 lbs  [facility] EMT-P Community Paramedic  [phone]     ACTION: Home visit completed     Patient Care Team: Tommy Malta, MD as PCP - General (Family Medicine) Tommy Golden Lisette Abu, MD as PCP - Cardiology (Cardiology) Tommy Prude, MD as PCP - Electrophysiology (Cardiology) Tommy Settle, MD as Consulting Physician (Oncology) Tommy Ward, DDS as Referring Physician (Oral Surgery) Tommy Baller, MD as Consulting Physician (Urology)  Patient Active Problem List   Diagnosis Date Noted   Vitamin D toxicity 07/24/2022   Anemia due to stage 4 chronic kidney disease (HCC) 03/20/2022   Hypercalcemia of malignancy 02/16/2021   Chronic systolic heart failure (HCC)    Abnormal stress test 12/05/2020   Depressed left ventricular ejection fraction 12/05/2020    Nonrheumatic mitral valve regurgitation 12/05/2020   Pre-diabetes 12/05/2020   Pulmonary hypertension (HCC) 12/05/2020   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HCC) 12/05/2020   Tobacco use 12/05/2020   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   HFrEF (heart failure with reduced ejection fraction) (HCC) 11/23/2020   CAD (coronary artery disease) 11/23/2020   GERD (gastroesophageal reflux disease)    Hyperlipidemia    Hypertension    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   Malignant neoplasm of prostate (HCC) 06/02/2020   AKI (acute kidney injury) (HCC) 11/25/2019   Hyperphosphatemia 11/25/2019   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   Hyperkalemia 08/24/2019   Metabolic bone disease 08/24/2019   Vitamin D deficiency 08/24/2019   Benign hypertension with chronic kidney disease, stage III (HCC) 05/20/2019   Decreased cardiac ejection fraction 05/20/2019   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) 05/20/2019   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 02/24/2019   Olecranon bursitis of left elbow 06/25/2018   B12 deficiency 03/22/2018   Gastroesophageal reflux disease without esophagitis 03/22/2018   Chronic renal insufficiency, stage III (moderate) (HCC) 11/27/2015   Aortic valve disorder 07/08/2014   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   Cataract 11/2013   Essential hypertension 07/06/2012   Coronary arteriosclerosis 07/06/2012   Hypertensive heart disease without congestive heart  failure 07/06/2012   Left bundle branch block 07/06/2012   Mixed hyperlipidemia 07/06/2012   Cancer (HCC) 08/2008   S/P radiation therapy > 12 wks ago 2010    Current Outpatient Medications:    aspirin EC 81 MG tablet, Take 1 tablet (81 mg total) by mouth daily., Disp: 90 tablet, Rfl: 3   Cetirizine HCl 10 MG CAPS, Take 1 capsule by  mouth daily., Disp: , Rfl:    cholestyramine light (PREVALITE) 4 g packet, Take 4 g by mouth 2 (two) times daily as needed., Disp: , Rfl:    dapagliflozin propanediol (FARXIGA) 10 MG TABS tablet, Take 1 tablet (10 mg total) by mouth daily before breakfast. Pharmacy note: please split bill Farxiga to [facility] RX BIN: [id number] RX PCN: [id number] RX GROUP: [id number] RX ID: [id number], Disp: 90 tablet, Rfl: 3   digoxin (LANOXIN) 0.125 MG tablet, Take 1 tablet (0.125 mg total) by mouth every other day., Disp: 45 tablet, Rfl: 3   enzalutamide (XTANDI) 40 MG tablet, Take 160 mg by mouth daily., Disp: , Rfl:    Evolocumab (REPATHA SURECLICK) 140 MG/ML SOAJ, INJECT 1 DOSE UNDER THE SKIN EVERY 14 DAYS, Disp: 6 mL, Rfl: 3   FISH OIL-KRILL OIL PO, Take 500 mg by mouth daily., Disp: , Rfl:    metoprolol succinate (TOPROL XL) 25 MG 24 hr tablet, Take 1 tablet (25 mg total) by mouth at bedtime., Disp: 90 tablet, Rfl: 3   Multiple Vitamins-Minerals (PRESERVISION/LUTEIN) CAPS, Take 1 capsule by mouth at bedtime., Disp: , Rfl:    omeprazole (PRILOSEC) 40 MG capsule, Take 40 mg by mouth daily., Disp: , Rfl:    potassium chloride SA (KLOR-CON M) 20 MEQ tablet, Take 1 tablet (20 mEq total) by mouth daily., Disp: 30 tablet, Rfl: 3   pregabalin (LYRICA) 100 MG capsule, Take 100 mg by mouth 3 (three) times daily., Disp: , Rfl:    rosuvastatin (CRESTOR) 5 MG tablet, Take 5 mg by mouth daily., Disp: , Rfl:    spironolactone (ALDACTONE) 25 MG tablet, Take 1 tablet (25 mg total) by mouth daily., Disp: 90 tablet, Rfl: 1   tamsulosin (FLOMAX) 0.4 MG CAPS capsule, Take 1 capsule (0.4 mg total) by mouth daily., Disp: 90 capsule, Rfl: 0   torsemide (DEMADEX) 20 MG tablet, Take 4 tablets (80 mg total) by mouth daily., Disp: 120 tablet, Rfl: 2   traZODone (DESYREL) 100 MG tablet, Take 50 mg by mouth at bedtime., Disp: , Rfl:    amoxicillin (AMOXIL) 500 MG capsule, Take 500 mg by mouth 3 (three) times daily. To be completed by 04/25/23 at  noon - RX by Monia Pouch, DDS, Disp: , Rfl:  Allergies  Allergen Reactions   Ezetimibe Other (See Comments)    Myalgia   Gabapentin Itching and Other (See Comments)    Sore throat, breakouts   Statins Other (See Comments)    Myalgias (intolerance)     Social History   Socioeconomic History   Marital status: Married    Spouse name: Not on file   Number of children: Not on file   Years of education: Not on file   Highest education level: Not on file  Occupational History   Not on file  Tobacco Use   Smoking status: Every Day    Current packs/day: 0.50    Average packs/day: 0.5 packs/day for 50.0 years (25.0 ttl pk-yrs)    Types: Cigarettes   Smokeless tobacco: Never  Vaping Use   Vaping status: Never Used  Substance  and Sexual Activity   Alcohol use: Not Currently   Drug use: Never   Sexual activity: Not on file  Other Topics Concern   Not on file  Social History Narrative   Not on file   Social Determinants of Health   Financial Resource Strain: Low Risk  (05/22/2022)   Overall Financial Resource Strain (CARDIA)    Difficulty of Paying Living Expenses: Not very hard  Food Insecurity: No Food Insecurity (05/22/2022)   Hunger Vital Sign    Worried About Running Out of Food in the Last Year: Never true    Ran Out of Food in the Last Year: Never true  Transportation Needs: No Transportation Needs (05/22/2022)   PRAPARE - Administrator, Civil Service (Medical): No    Lack of Transportation (Non-Medical): No  Physical Activity: Not on file  Stress: Not on file  Social Connections: Not on file  Intimate Partner Violence: Not on file    Physical Exam      Future Appointments  Date Time Provider Department Center  05/26/2023 11:30 AM [facility] PA/NP [facility] None  06/12/2023  9:30 AM [facility] None  06/13/2023  9:30 AM Tommy Settle, MD [facility] None  06/16/2023 10:15 AM [place], Jenelle Mages, DPM [facility]  06/23/2023  3:40 PM  [facility] DEVICE REMOTES [facility]  08/04/2023  2:00 PM Laurey Morale, MD [facility] None  09/22/2023  3:40 PM [facility] DEVICE REMOTES [facility]  12/22/2023  3:40 PM [facility] DEVICE REMOTES [facility]  03/22/2024  3:40 PM [facility] DEVICE REMOTES [facility]  06/21/2024  3:40 PM [facility] DEVICE REMOTES [facility]  09/20/2024  3:40 PM [facility] DEVICE REMOTES [facility]  12/20/2024  3:40 PM [facility] DEVICE REMOTES [facility]  03/21/2025  3:40 PM [facility] DEVICE REMOTES [facility]  06/20/2025  3:40 PM [facility] DEVICE REMOTES [facility]  09/19/2025  3:40 PM [facility] DEVICE REMOTES [facility]

## 2023-05-06 ENCOUNTER — Telehealth (HOSPITAL_COMMUNITY): Payer: Self-pay

## 2023-05-06 ENCOUNTER — Encounter: Payer: Self-pay | Admitting: Family Medicine

## 2023-05-06 NOTE — Telephone Encounter (Signed)
Spoke to [name]'s wife in asking her assistance to have the nurse at his PCP office whom she is in contact with to send his Pregabalin to [name] as [name] reports having no record of the prescription according to [name]. [name] agreed and said she would call their office now.   [name] notified of update.   [name], EMT-Paramedic [phone] 05/06/2023

## 2023-05-07 ENCOUNTER — Other Ambulatory Visit (HOSPITAL_COMMUNITY): Payer: Self-pay | Admitting: Emergency Medicine

## 2023-05-07 ENCOUNTER — Other Ambulatory Visit (HOSPITAL_COMMUNITY): Payer: Self-pay | Admitting: Family Medicine

## 2023-05-07 ENCOUNTER — Other Ambulatory Visit: Payer: Self-pay

## 2023-05-07 DIAGNOSIS — I5022 Chronic systolic (congestive) heart failure: Secondary | ICD-10-CM

## 2023-05-07 NOTE — Progress Notes (Signed)
Paramedicine Encounter    Patient ID: Tommy Ward, male    DOB: 1940/12/25, 82 y.o.   MRN: [id number]   Complaints - no complaints  Assessment - lung sounds clear, no edema noted on extremities, arms, or abdomen.   Compliance with meds -  1x evening dose, 1x noon dose  Pill box filled  - for 1x week    Refills needed - Pregabalin, ASA  Meds changes since last visit -  Pt started taking an OTC Supplement to help his nerve pain in his leg, "ProNerve6". Pt was advised to use caution and to reevaluate use with his Nephrologist and PCP due to his hx of kidney disease and hypercalcemia.     Social changes -  none   VISIT SUMMARY** Met with Tommy Ward in the home. No complaints reported today. Pt denied any chest pain, palpitations, shortness of breath, or dizziness. Pt was continuing to complete his ADLs without difficulty. Pt's pill box was filled for 1x week. Upcoming appointments reviewed. Will follow up in 1x week.   BP 130/62   Pulse 72   Resp 16   Wt 172 lb 9.6 oz (78.3 kg)   SpO2 95%   BMI 25.49 kg/m  Weight yesterday-DNW Last visit weight-172lbs   [facility] EMT-P Community Paramedic  [phone]    ACTION: Home visit completed     Patient Care Team: Buckner Malta, MD as PCP - General (Family Medicine) Rennis Golden Lisette Abu, MD as PCP - Cardiology (Cardiology) Lanier Prude, MD as PCP - Electrophysiology (Cardiology) Weston Settle, MD as Consulting Physician (Oncology) Dora Sims, DDS as Referring Physician (Oral Surgery) Debroah Baller, MD as Consulting Physician (Urology)  Patient Active Problem List   Diagnosis Date Noted   Vitamin D toxicity 07/24/2022   Anemia due to stage 4 chronic kidney disease (HCC) 03/20/2022   Hypercalcemia of malignancy 02/16/2021   Chronic systolic heart failure (HCC)    Abnormal stress test 12/05/2020   Depressed left ventricular ejection fraction 12/05/2020   Nonrheumatic mitral valve regurgitation  12/05/2020   Pre-diabetes 12/05/2020   Pulmonary hypertension (HCC) 12/05/2020   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HCC) 12/05/2020   Tobacco use 12/05/2020   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   HFrEF (heart failure with reduced ejection fraction) (HCC) 11/23/2020   CAD (coronary artery disease) 11/23/2020   GERD (gastroesophageal reflux disease)    Hyperlipidemia    Hypertension    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   Malignant neoplasm of prostate (HCC) 06/02/2020   AKI (acute kidney injury) (HCC) 11/25/2019   Hyperphosphatemia 11/25/2019   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   Hyperkalemia 08/24/2019   Metabolic bone disease 08/24/2019   Vitamin D deficiency 08/24/2019   Benign hypertension with chronic kidney disease, stage III (HCC) 05/20/2019   Decreased cardiac ejection fraction 05/20/2019   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) 05/20/2019   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 02/24/2019   Olecranon bursitis of left elbow 06/25/2018   B12 deficiency 03/22/2018   Gastroesophageal reflux disease without esophagitis 03/22/2018   Chronic renal insufficiency, stage III (moderate) (HCC) 11/27/2015   Aortic valve disorder 07/08/2014   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   Cataract 11/2013   Essential hypertension 07/06/2012   Coronary arteriosclerosis 07/06/2012   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   Left bundle branch block  07/06/2012   Mixed hyperlipidemia 07/06/2012   Cancer (HCC) 08/2008   S/P radiation therapy > 12 wks ago 2010    Current Outpatient Medications:    aspirin EC 81 MG tablet, Take 1 tablet (81 mg total) by mouth daily., Disp: 90 tablet, Rfl: 3   Cetirizine HCl 10 MG CAPS, Take 1 capsule by mouth daily., Disp: , Rfl:     cholestyramine light (PREVALITE) 4 g packet, Take 4 g by mouth 2 (two) times daily as needed., Disp: , Rfl:    dapagliflozin propanediol (FARXIGA) 10 MG TABS tablet, Take 1 tablet (10 mg total) by mouth daily before breakfast. Pharmacy note: please split bill Farxiga to [facility] RX BIN: [id number] RX PCN: [id number] RX GROUP: [id number] RX ID: [id number], Disp: 90 tablet, Rfl: 3   digoxin (LANOXIN) 0.125 MG tablet, Take 1 tablet (0.125 mg total) by mouth every other day., Disp: 45 tablet, Rfl: 3   enzalutamide (XTANDI) 40 MG tablet, Take 160 mg by mouth daily., Disp: , Rfl:    Evolocumab (REPATHA SURECLICK) 140 MG/ML SOAJ, INJECT 1 DOSE UNDER THE SKIN EVERY 14 DAYS, Disp: 6 mL, Rfl: 3   FISH OIL-KRILL OIL PO, Take 500 mg by mouth daily., Disp: , Rfl:    metoprolol succinate (TOPROL XL) 25 MG 24 hr tablet, Take 1 tablet (25 mg total) by mouth at bedtime., Disp: 90 tablet, Rfl: 3   Multiple Vitamins-Minerals (PRESERVISION/LUTEIN) CAPS, Take 1 capsule by mouth at bedtime., Disp: , Rfl:    omeprazole (PRILOSEC) 40 MG capsule, Take 40 mg by mouth daily., Disp: , Rfl:    pregabalin (LYRICA) 100 MG capsule, Take 100 mg by mouth 3 (three) times daily., Disp: , Rfl:    rosuvastatin (CRESTOR) 5 MG tablet, Take 5 mg by mouth daily., Disp: , Rfl:    spironolactone (ALDACTONE) 25 MG tablet, Take 1 tablet (25 mg total) by mouth daily., Disp: 90 tablet, Rfl: 1   torsemide (DEMADEX) 20 MG tablet, Take 4 tablets (80 mg total) by mouth daily., Disp: 120 tablet, Rfl: 2   traZODone (DESYREL) 100 MG tablet, Take 50 mg by mouth at bedtime., Disp: , Rfl:    amoxicillin (AMOXIL) 500 MG capsule, Take 500 mg by mouth 3 (three) times daily. To be completed by 04/25/23 at noon - RX by Monia Pouch, DDS, Disp: , Rfl:    potassium chloride SA (KLOR-CON M) 20 MEQ tablet, Take 1 tablet (20 mEq total) by mouth daily., Disp: 30 tablet, Rfl: 3   tamsulosin (FLOMAX) 0.4 MG CAPS capsule, Take 1 capsule (0.4 mg total) by mouth daily., Disp: 90 capsule,  Rfl: 0 Allergies  Allergen Reactions   Ezetimibe Other (See Comments)    Myalgia   Gabapentin Itching and Other (See Comments)    Sore throat, breakouts   Statins Other (See Comments)    Myalgias (intolerance)     Social History   Socioeconomic History   Marital status: Married    Spouse name: Not on file   Number of children: Not on file   Years of education: Not on file   Highest education level: Not on file  Occupational History   Not on file  Tobacco Use   Smoking status: Every Day    Current packs/day: 0.50    Average packs/day: 0.5 packs/day for 50.0 years (25.0 ttl pk-yrs)    Types: Cigarettes   Smokeless tobacco: Never  Vaping Use   Vaping status: Never Used  Substance and Sexual Activity   Alcohol use: Not  Currently   Drug use: Never   Sexual activity: Not on file  Other Topics Concern   Not on file  Social History Narrative   Not on file   Social Determinants of Health   Financial Resource Strain: Low Risk  (05/22/2022)   Overall Financial Resource Strain (CARDIA)    Difficulty of Paying Living Expenses: Not very hard  Food Insecurity: No Food Insecurity (05/22/2022)   Hunger Vital Sign    Worried About Running Out of Food in the Last Year: Never true    Ran Out of Food in the Last Year: Never true  Transportation Needs: No Transportation Needs (05/22/2022)   PRAPARE - Administrator, Civil Service (Medical): No    Lack of Transportation (Non-Medical): No  Physical Activity: Not on file  Stress: Not on file  Social Connections: Not on file  Intimate Partner Violence: Not on file    Physical Exam      Future Appointments  Date Time Provider Department Center  05/28/2023  2:00 PM [facility] PA/NP [facility] None  06/12/2023  9:30 AM [facility] None  06/13/2023  9:30 AM Weston Settle, MD [facility] None  06/16/2023 10:15 AM [place], Jenelle Mages, DPM [facility]  06/23/2023  3:40 PM [facility] DEVICE REMOTES  [facility]  08/04/2023  2:00 PM Laurey Morale, MD [facility] None  09/22/2023  3:40 PM [facility] DEVICE REMOTES [facility]  12/22/2023  3:40 PM [facility] DEVICE REMOTES [facility]  03/22/2024  3:40 PM [facility] DEVICE REMOTES [facility]  06/21/2024  3:40 PM [facility]ICE REMOTES [facility]  09/20/2024  3:40 PM [facility] DEVICE REMOTES [facility]  12/20/2024  3:40 PM [facility] DEVICE REMOTES [facility]  03/21/2025  3:40 PM [facility] DEVICE REMOTES [facility]  06/20/2025  3:40 PM [facility] DEVICE REMOTES [facility]  09/19/2025  3:40 PM [facility] DEVICE REM[facility]ES [facility]

## 2023-05-08 LAB — BASIC METABOLIC PANEL
BUN/Creatinine Ratio: 17 (ref 10–24)
BUN: 35 mg/dL — ABNORMAL HIGH (ref 8–27)
CO2: 27 mmol/L (ref 20–29)
Calcium: 9.5 mg/dL (ref 8.6–10.2)
Chloride: 99 mmol/L (ref 96–106)
Creatinine, Ser: 2.03 mg/dL — ABNORMAL HIGH (ref 0.76–1.27)
Glucose: 124 mg/dL — ABNORMAL HIGH (ref 70–99)
Potassium: 4.3 mmol/L (ref 3.5–5.2)
Sodium: 141 mmol/L (ref 134–144)
eGFR: 32 mL/min/{1.73_m2} — ABNORMAL LOW (ref >59)

## 2023-05-14 ENCOUNTER — Other Ambulatory Visit (HOSPITAL_COMMUNITY): Payer: Self-pay | Admitting: Emergency Medicine

## 2023-05-14 NOTE — Progress Notes (Unsigned)
Paramedicine Encounter    Patient ID: Tommy Ward, male    DOB: 1940-10-16, 82 y.o.   MRN: [id number]   Complaints - none   Assessment - clear lung sounds. No edema noted    Compliance with meds - no missed doses   Pill box filled - for 1x week    Refills needed - pregabolin, farxia, potassium capsules   Meds changes since last visit - continued using ProNerve^    Social changes - none    VISIT SUMMARY**    There were no vitals taken for this visit. Weight yesterday-172.8 lbs Last visit weight-172 lbs   [place] [facility] EMT-P Community Paramedic  [phone]    ACTION: Home visit completed     Patient Care Team: Buckner Malta, MD as PCP - General (Family Medicine) Rennis Golden Lisette Abu, MD as PCP - Cardiology (Cardiology) Lanier Prude, MD as PCP - Electrophysiology (Cardiology) Weston Settle, MD as Consulting Physician (Oncology) Dora Sims, DDS as Referring Physician (Oral Surgery) Debroah Baller, MD as Consulting Physician (Urology)  Patient Active Problem List   Diagnosis Date Noted  . Vitamin D toxicity 07/24/2022  . Anemia due to stage 4 chronic kidney disease (HCC) 03/20/2022  . Hypercalcemia of malignancy 02/16/2021  . Chronic systolic heart failure (HCC)   . Abnormal stress test 12/05/2020  . Depressed left ventricular ejection fraction 12/05/2020  . Nonrheumatic mitral valve regurgitation 12/05/2020  . Pre-diabetes 12/05/2020  . Pulmonary hypertension (HCC) 12/05/2020  . 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  . 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HCC) 12/05/2020  . Tobacco use 12/05/2020  . 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  . HFrEF (heart failure with reduced ejection fraction) (HCC) 11/23/2020  . CAD (coronary artery disease) 11/23/2020  . GERD (gastroesophageal reflux disease)   . Hyperlipidemia   . Hypertension   . 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  . Malignant  neoplasm of prostate (HCC) 06/02/2020  . AKI (acute kidney injury) (HCC) 11/25/2019  . Hyperphosphatemia 11/25/2019  . 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  . Hyperkalemia 08/24/2019  . Metabolic bone disease 08/24/2019  . Vitamin D deficiency 08/24/2019  . Benign hypertension with chronic kidney disease, stage III (HCC) 05/20/2019  . Decreased cardiac ejection fraction 05/20/2019  . 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) 05/20/2019  . 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  . 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 Olecranon bursitis of left elbow 06/25/2018  . B12 deficiency 03/22/2018  . Gastroesophageal reflux disease without esophagitis 03/22/2018  . Chronic renal insufficiency, stage III (moderate) (HCC) 11/27/2015  . Aortic valve disorder 07/08/2014  . 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 Cataract 11/2013  . Essential hypertension 07/06/2012  . Coronary arteriosclerosis 07/06/2012  . 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  . Left bundle branch block 07/06/2012  . Mixed hyperlipidemia 07/06/2012  . Cancer (HCC) 08/2008  . S/P radiation therapy > 12 wks ago 2010    Current Outpatient Medications:  .  amoxicillin (AMOXIL) 500 MG capsule, Take 500 mg by mouth 3 (three) times daily. To be completed by 04/25/23 at noon - RX by Monia Pouch, DDS, Disp: , Rfl:  .  aspirin EC 81 MG tablet, Take 1 tablet (81 mg total) by mouth daily., Disp: 90 tablet, Rfl: 3 .  Cetirizine HCl 10 MG CAPS, Take 1 capsule by mouth daily., Disp: , Rfl:  .  cholestyramine light (PREVALITE) 4 g packet, Take 4 g  by mouth 2 (two) times daily as needed., Disp: , Rfl:  .  dapagliflozin propanediol (FARXIGA) 10 MG TABS tablet, Take 1 tablet (10 mg total) by mouth daily before breakfast. Pharmacy note: please split bill Farxiga to [facility] RX BIN: [id number] RX PCN: [id number] RX GROUP: [id number] RX ID: [id number], Disp: 90  tablet, Rfl: 3 .  digoxin (LANOXIN) 0.125 MG tablet, Take 1 tablet (0.125 mg total) by mouth every other day., Disp: 45 tablet, Rfl: 3 .  enzalutamide (XTANDI) 40 MG tablet, Take 160 mg by mouth daily., Disp: , Rfl:  .  Evolocumab (REPATHA SURECLICK) 140 MG/ML SOAJ, INJECT 1 DOSE UNDER THE SKIN EVERY 14 DAYS, Disp: 6 mL, Rfl: 3 .  FISH OIL-KRILL OIL PO, Take 500 mg by mouth daily., Disp: , Rfl:  .  metoprolol succinate (TOPROL XL) 25 MG 24 hr tablet, Take 1 tablet (25 mg total) by mouth at bedtime., Disp: 90 tablet, Rfl: 3 .  Multiple Vitamins-Minerals (PRESERVISION/LUTEIN) CAPS, Take 1 capsule by mouth at bedtime., Disp: , Rfl:  .  omeprazole (PRILOSEC) 40 MG capsule, Take 40 mg by mouth daily., Disp: , Rfl:  .  potassium chloride SA (KLOR-CON M) 20 MEQ tablet, Take 1 tablet (20 mEq total) by mouth daily., Disp: 30 tablet, Rfl: 3 .  pregabalin (LYRICA) 100 MG capsule, Take 100 mg by mouth 3 (three) times daily., Disp: , Rfl:  .  rosuvastatin (CRESTOR) 5 MG tablet, Take 5 mg by mouth daily., Disp: , Rfl:  .  spironolactone (ALDACTONE) 25 MG tablet, Take 1 tablet (25 mg total) by mouth daily., Disp: 90 tablet, Rfl: 1 .  tamsulosin (FLOMAX) 0.4 MG CAPS capsule, Take 1 capsule (0.4 mg total) by mouth daily., Disp: 90 capsule, Rfl: 0 .  torsemide (DEMADEX) 20 MG tablet, Take 4 tablets (80 mg total) by mouth daily., Disp: 120 tablet, Rfl: 2 .  traZODone (DESYREL) 100 MG tablet, Take 50 mg by mouth at bedtime., Disp: , Rfl:  Allergies  Allergen Reactions  . Ezetimibe Other (See Comments)    Myalgia  . Gabapentin Itching and Other (See Comments)    Sore throat, breakouts  . Statins Other (See Comments)    Myalgias (intolerance)     Social History   Socioeconomic History  . Marital status: Married    Spouse name: Not on file  . Number of children: Not on file  . Years of education: Not on file  . Highest education level: Not on file  Occupational History  . Not on file  Tobacco Use  .  Smoking status: Every Day    Current packs/day: 0.50    Average packs/day: 0.5 packs/day for 50.0 years (25.0 ttl pk-yrs)    Types: Cigarettes  . Smokeless tobacco: Never  Vaping Use  . Vaping status: Never Used  Substance and Sexual Activity  . Alcohol use: Not Currently  . Drug use: Never  . Sexual activity: Not on file  Other Topics Concern  . Not on file  Social History Narrative  . Not on file   Social Determinants of Health   Financial Resource Strain: Low Risk  (05/22/2022)   Overall Financial Resource Strain (CARDIA)   . Difficulty of Paying Living Expenses: Not very hard  Food Insecurity: No Food Insecurity (05/22/2022)   Hunger Vital Sign   . Worried About Programme researcher, broadcasting/film/video in the Last Year: Never true   . Ran Out of Food in the Last Year: Never true  Transportation Needs: No Transportation  Needs (05/22/2022)   PRAPARE - Transportation   . Lack of Transportation (Medical): No   . Lack of Transportation (Non-Medical): No  Physical Activity: Not on file  Stress: Not on file  Social Connections: Not on file  Intimate Partner Violence: Not on file    Physical Exam      Future Appointments  Date Time Provider Department Center  05/28/2023  2:00 PM [facility] PA/NP [facility] None  06/12/2023  9:30 AM [facility] None  06/13/2023  9:30 AM Weston Settle, MD [facility] None  06/16/2023 10:15 AM [place], Jenelle Mages, DPM [facility]  06/23/2023  3:40 PM [facility] DEVICE REMOTES [facility]  08/04/2023  2:00 PM Laurey Morale, MD [facility] None  09/22/2023  3:40 PM [facility] DEVICE REMOTES [facility]  12/22/2023  3:40 PM [facility] DEVICE REMOTES [facility]  03/22/2024  3:40 PM [facility] DEVICE REMOTES [facility]  06/21/2024  3:40 PM [facility] DEVICE REMOTES [facility]  09/20/2024  3:40 PM [facility] DEVICE REMOTES [facility]  12/20/2024  3:40 PM [facility] DEVICE  REMOTES [facility]  03/21/2025  3:40 PM [facility] DEVICE REMOTES [facility]  06/20/2025  3:40 PM [facility] DEVICE REMOTES [facility]  09/19/2025  3:40 PM [facility] DEVICE REMOTES [facility]

## 2023-05-21 ENCOUNTER — Other Ambulatory Visit (HOSPITAL_COMMUNITY): Payer: Self-pay | Admitting: Emergency Medicine

## 2023-05-21 NOTE — Progress Notes (Signed)
Paramedicine Encounter    Patient ID: Tommy Ward, male    DOB: Jul 21, 1941, 82 y.o.   MRN: [id number]   Complaints - none   Assessment - lung sounds clear, no edema noted   Compliance with meds - no missed doses   Pill box filled - for 1x week    Refills needed xtandi, potassium capsules, farxiga  Meds changes since last visit - none    Social changes - Wife out of town all week. Was advised on how to get in contact if necessary.    VISIT SUMMARY**  Met with [facility] in the home today. Pt had no complaint and was getting around well without shortness of breath. Pt was did complain about his medications being hard to swallow and requested we change the pill to a capsule. Will follow up at upcoming appointment. Pill box was filled for 1x week. Upcoming appointments reviewed and reminder written for reference. Will follow up at the clinic with pt next week.   BP 120/62   Pulse 70   Resp 16   Wt 173 lb (78.5 kg)   SpO2 99%   BMI 25.55 kg/m  Weight yesterday-DNW Last visit weight-172    [place] [facility] EMT-P Community Paramedic  [phone]    ACTION: Home visit completed     Patient Care Team: Buckner Malta, MD as PCP - General (Family Medicine) Rennis Golden, Lisette Abu, MD as PCP - Cardiology (Cardiology) Lanier Prude, MD as PCP - Electrophysiology (Cardiology) Weston Settle, MD as Consulting Physician (Oncology) Dora Sims, DDS as Referring Physician (Oral Surgery) Debroah Baller, MD as Consulting Physician (Urology)  Patient Active Problem List   Diagnosis Date Noted   Vitamin D toxicity 07/24/2022   Anemia due to stage 4 chronic kidney disease (HCC) 03/20/2022   Hypercalcemia of malignancy 02/16/2021   Chronic systolic heart failure (HCC)    Abnormal stress test 12/05/2020   Depressed left ventricular ejection fraction 12/05/2020   Nonrheumatic mitral valve regurgitation 12/05/2020   Pre-diabetes 12/05/2020   Pulmonary hypertension (HCC)  12/05/2020   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HCC) 12/05/2020   Tobacco use 12/05/2020   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   HFrEF (heart failure with reduced ejection fraction) (HCC) 11/23/2020   CAD (coronary artery disease) 11/23/2020   GERD (gastroesophageal reflux disease)    Hyperlipidemia    Hypertension    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   Malignant neoplasm of prostate (HCC) 06/02/2020   AKI (acute kidney injury) (HCC) 11/25/2019   Hyperphosphatemia 11/25/2019   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   Hyperkalemia 08/24/2019   Metabolic bone disease 08/24/2019   Vitamin D deficiency 08/24/2019   Benign hypertension with chronic kidney disease, stage III (HCC) 05/20/2019   Decreased cardiac ejection fraction 05/20/2019   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) 05/20/2019   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 02/24/2019   Olecranon bursitis of left elbow 06/25/2018   B12 deficiency 03/22/2018   Gastroesophageal reflux disease without esophagitis 03/22/2018   Chronic renal insufficiency, stage III (moderate) (HCC) 11/27/2015   Aortic valve disorder 07/08/2014   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   Cataract 11/2013   Essential hypertension 07/06/2012   Coronary arteriosclerosis 07/06/2012   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   Left bundle branch block 07/06/2012   Mixed hyperlipidemia 07/06/2012   Cancer (HCC)  08/2008   S/P radiation therapy > 12 wks ago 2010    Current Outpatient Medications:    aspirin EC 81 MG tablet, Take 1 tablet (81 mg total) by mouth daily., Disp: 90 tablet, Rfl: 3   Cetirizine HCl 10 MG CAPS, Take 1 capsule by mouth daily., Disp: , Rfl:    cholestyramine light (PREVALITE) 4 g packet, Take 4 g by mouth 2 (two) times  daily as needed., Disp: , Rfl:    dapagliflozin propanediol (FARXIGA) 10 MG TABS tablet, Take 1 tablet (10 mg total) by mouth daily before breakfast. Pharmacy note: please split bill Farxiga to [facility] RX BIN: [id number] RX PCN: [id number] RX GROUP: [id number] RX ID: [id number], Disp: 90 tablet, Rfl: 3   digoxin (LANOXIN) 0.125 MG tablet, Take 1 tablet (0.125 mg total) by mouth every other day., Disp: 45 tablet, Rfl: 3   enzalutamide (XTANDI) 40 MG tablet, Take 160 mg by mouth daily., Disp: , Rfl:    Evolocumab (REPATHA SURECLICK) 140 MG/ML SOAJ, INJECT 1 DOSE UNDER THE SKIN EVERY 14 DAYS, Disp: 6 mL, Rfl: 3   FISH OIL-KRILL OIL PO, Take 500 mg by mouth daily., Disp: , Rfl:    metoprolol succinate (TOPROL XL) 25 MG 24 hr tablet, Take 1 tablet (25 mg total) by mouth at bedtime., Disp: 90 tablet, Rfl: 3   Multiple Vitamins-Minerals (PRESERVISION/LUTEIN) CAPS, Take 1 capsule by mouth at bedtime., Disp: , Rfl:    omeprazole (PRILOSEC) 40 MG capsule, Take 40 mg by mouth daily., Disp: , Rfl:    potassium chloride SA (KLOR-CON M) 20 MEQ tablet, Take 1 tablet (20 mEq total) by mouth daily., Disp: 30 tablet, Rfl: 3   pregabalin (LYRICA) 100 MG capsule, Take 100 mg by mouth 3 (three) times daily., Disp: , Rfl:    rosuvastatin (CRESTOR) 5 MG tablet, Take 5 mg by mouth daily., Disp: , Rfl:    spironolactone (ALDACTONE) 25 MG tablet, Take 1 tablet (25 mg total) by mouth daily., Disp: 90 tablet, Rfl: 1   tamsulosin (FLOMAX) 0.4 MG CAPS capsule, Take 1 capsule (0.4 mg total) by mouth daily., Disp: 90 capsule, Rfl: 0   torsemide (DEMADEX) 20 MG tablet, Take 4 tablets (80 mg total) by mouth daily., Disp: 120 tablet, Rfl: 2   traZODone (DESYREL) 100 MG tablet, Take 50 mg by mouth at bedtime., Disp: , Rfl:    amoxicillin (AMOXIL) 500 MG capsule, Take 500 mg by mouth 3 (three) times daily. To be completed by 04/25/23 at noon - RX by Monia Pouch, DDS, Disp: , Rfl:  Allergies  Allergen Reactions   Ezetimibe Other (See Comments)     Myalgia   Gabapentin Itching and Other (See Comments)    Sore throat, breakouts   Statins Other (See Comments)    Myalgias (intolerance)     Social History   Socioeconomic History   Marital status: Married    Spouse name: Not on file   Number of children: Not on file   Years of education: Not on file   Highest education level: Not on file  Occupational History   Not on file  Tobacco Use   Smoking status: Every Day    Current packs/day: 0.50    Average packs/day: 0.5 packs/day for 50.0 years (25.0 ttl pk-yrs)    Types: Cigarettes   Smokeless tobacco: Never  Vaping Use   Vaping status: Never Used  Substance and Sexual Activity   Alcohol use: Not Currently   Drug use: Never   Sexual activity:  Not on file  Other Topics Concern   Not on file  Social History Narrative   Not on file   Social Determinants of Health   Financial Resource Strain: Low Risk  (05/22/2022)   Overall Financial Resource Strain (CARDIA)    Difficulty of Paying Living Expenses: Not very hard  Food Insecurity: No Food Insecurity (05/22/2022)   Hunger Vital Sign    Worried About Running Out of Food in the Last Year: Never true    Ran Out of Food in the Last Year: Never true  Transportation Needs: No Transportation Needs (05/22/2022)   PRAPARE - Administrator, Civil Service (Medical): No    Lack of Transportation (Non-Medical): No  Physical Activity: Not on file  Stress: Not on file  Social Connections: Not on file  Intimate Partner Violence: Not on file    Physical Exam      Future Appointments  Date Time Provider Department Center  05/28/2023  2:00 PM [facility] PA/NP [facility] None  06/12/2023  9:30 AM [facility] None  06/13/2023  9:30 AM Weston Settle, MD [facility] None  06/16/2023 10:15 AM [place], Jenelle Mages, DPM [facility]  06/23/2023  3:40 PM [facility] DEVICE REMOTES [facility]  08/04/2023  2:00 PM Laurey Morale, MD [facility] None   09/22/2023  3:40 PM [facility] DEVICE REMOTES [facility]  12/22/2023  3:40 PM [facility] DEVICE REMOTES [facility]  03/22/2024  3:40 PM [facility] DEVICE REMOTES [facility]  06/21/2024  3:40 PM [facility] DEVICE REMOTES [facility]  09/20/2024  3:40 PM [facility] DEVICE REMOTES [facility]  12/20/2024  3:40 PM [facility] DEVICE REMOTES [facility]  03/21/2025  3:40 PM [facility] DEVICE REMOTES [facility]  06/20/2025  3:40 PM [facility] DEVICE REMOTES [facility]  09/19/2025  3:40 PM [facility] DEVICE REMOTES [facility]

## 2023-05-26 ENCOUNTER — Encounter (HOSPITAL_COMMUNITY): Payer: Medicare Other

## 2023-05-27 NOTE — Progress Notes (Signed)
PCP: Buckner Malta, MD Cardiology: Dr. Tomie China HF Cardiology: Dr. Shirlee Latch  82 y.o. with history of prostate cancer, CKD stage 3, CAD, and ischemic cardiomyopathy was referred by Dr. Tomie China for evaluation of CHF.  Patient developed gradually worsening exertional dyspnea starting early this year.  The shortness of breath has been especially bad for about 1.5 months.  He had an echo done in 4/22, showing EF < 20%, moderate-severe LV dilation, normal RV, severe MR.  LHC/RHC was done showing low cardiac index at 1.81 and occluded mid LAD.  No intervention.  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   TEE was done 6/22, showing EF < 20% with septal-lateral dyssynchrony, mildly decreased RV systolic function, moderate TR, moderate central MR with ERO 0.2 cm^2.  Echo in 7/22 similarly showed EF 20%, normal RV, moderate MR.  Patient had St Jude CRT-D device implanted.  Echo in 1/23 showed EF <20% with moderate LV dilation, mildly decreased RV systolic function, normal IVC.   Echo in 9/23 showed EF 20-25%, mild MR.   Had an episode 04/02/23 of not feeling well, having palpitations, increased dyspnea and LEE. ECG, via paramedic, showed frequent PVCs. Advised evaluation in ED, however patient declined.  Today he returns for HF follow up with his wife and paramedic, [name]. Overall feeling fine. More SOB for past 3-4 days. Swelling is better. Denies palpitations, CP, dizziness, edema, or PND/Orthopnea. Appetite ok. No fever or chills. Weight at home 176 pounds. Taking all medications. 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  ReDs: 43%  Device interrogation (personally reviewed): CorVue suggests volume overload, 94% BiV pacing, no VT or AF  Labs (5/22): K 3.4, creatinine 1.7 => 2.15, AST 123 => 189, ALT 322 =>190, tbili 1.7, Hgb 12, LDL 38 Labs (6/22): Viral hepatitis  serologies negative Labs (8/22): K 4.2, creatinine 1.87 Labs (10/22): K 3.7, creatinine 2 Labs (11/22): LDL 159 Labs (12/22): K 3.7, creatinine 2 Labs (4/23): K 4.4, creatinine 2.7, LFTs normal Labs (7/23): K 4.4, creatinine 2.35 Labs (10/23): K 4.0, creatinine 2.4, LFTs normal Labs (2/24): K 4.9, creatinine 2.15, LDL 46, TGs 226 Labs (6/24): K 3.9, creatinine 2.19 Labs (9/24): K 4.3, creatinine 2.03  PMH:  1. Hyperlipidemia 2. HTN 3. CKD stage 3 4. 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  Treated with radiation and currently on denosumab.   5. Cholecystectomy 6. CAD: LHC in 4/22 with 80% proximal RCA stenosis (nondominant), totally occluded mLAD with collaterals, 60% D1.  No intervention.  7. Chronic systolic CHF: Ischemic cardiomyopathy.  St Jude CRT-D.  - Echo (4/22) with EF < 20%, moderate-severe LV dilation, normal RV, severe LAE, severe MR.  - RHC (4/22): mean RA 3, PA 39/10, mean PCWP 12, CI 1.81 - Echo (6/22): EF < 20% with septal-lateral dyssynchrony, mildly decreased RV systolic function, moderate TR, moderate central MR with ERO 0.2 cm^2.  - Echo (7/22): EF < 20%, mild LVH, normal RV, moderate MR.  - Echo (1/23: EF < 20% with moderate LV dilation, mildly decreased RV systolic function, normal IVC, mild-moderate MR. 8. Mitral regurgitation: Severe on 4/22 echo.  - TEE (6/22) with moderate central MR with ERO 0.2 cm^2 (functional, annular dilatation).  - Echo (1/23) with mild-moderate MR.  - Echo (9/23): EF 20-25%, mild MR.  9. Elevated LFTs: Viral hepatitis labs negative.  RUQ [place] (6/22) was not suggestive of cirrhosis, ascites noted.  10. Hypercalcemia: Due  to Ca supplementation in setting of renal dysfunction.  11. Chronic LBBB  Social History   Socioeconomic History   Marital status: Married    Spouse name: Not on file   Number of children: Not on file   Years of education: Not on file   Highest education level: Not on file  Occupational History   Not on file   Tobacco Use   Smoking status: Every Day    Current packs/day: 0.50    Average packs/day: 0.5 packs/day for 50.0 years (25.0 ttl pk-yrs)    Types: Cigarettes   Smokeless tobacco: Never  Vaping Use   Vaping status: Never Used  Substance and Sexual Activity   Alcohol use: Not Currently   Drug use: Never   Sexual activity: Not on file  Other Topics Concern   Not on file  Social History Narrative   Not on file   Social Determinants of Health   Financial Resource Strain: Low Risk  (05/22/2022)   Overall Financial Resource Strain (CARDIA)    Difficulty of Paying Living Expenses: Not very hard  Food Insecurity: No Food Insecurity (05/22/2022)   Hunger Vital Sign    Worried About Running Out of Food in the Last Year: Never true    Ran Out of Food in the Last Year: Never true  Transportation Needs: No Transportation Needs (05/22/2022)   PRAPARE - Administrator, Civil Service (Medical): No    Lack of Transportation (Non-Medical): No  Physical Activity: Not on file  Stress: Not on file  Social Connections: Not on file  Intimate Partner Violence: Not on file   Family History  Problem Relation Age of Onset   Leukemia Mother    Prostate cancer Father    Heart attack Brother    Colon cancer Neg Hx    Rectal cancer Neg Hx    Stomach cancer Neg Hx    Esophageal cancer Neg Hx    ROS: All systems reviewed and negative except as per HPI.   Current Outpatient Medications  Medication Sig Dispense Refill   aspirin EC 81 MG tablet Take 1 tablet (81 mg total) by mouth daily. 90 tablet 3   Cetirizine HCl 10 MG CAPS Take 1 capsule by mouth daily.     cholestyramine light (PREVALITE) 4 g packet Take 4 g by mouth 2 (two) times daily as needed.     dapagliflozin propanediol (FARXIGA) 10 MG TABS tablet Take 1 tablet (10 mg total) by mouth daily before breakfast. Pharmacy note: please split bill Farxiga to [facility] RX BIN: [id number] RX PCN: [id number] RX GROUP: [id number] RX ID: [id number]  tablet 3   digoxin (LANOXIN) 0.125 MG tablet Take 1 tablet (0.125 mg total) by mouth every other day. 45 tablet 3   enzalutamide (XTANDI) 40 MG tablet Take 160 mg by mouth daily.     Evolocumab (REPATHA SURECLICK) 140 MG/ML SOAJ INJECT 1 DOSE UNDER THE SKIN EVERY 14 DAYS 6 mL 3   FISH OIL-KRILL OIL PO Take 500 mg by mouth daily.     metoprolol succinate (TOPROL XL) 25 MG 24 hr tablet Take 1 tablet (25 mg total) by mouth at bedtime. 90 tablet 3   Multiple Vitamins-Minerals (PRESERVISION/LUTEIN) CAPS Take 1 capsule by mouth at bedtime.     omeprazole (PRILOSEC) 40 MG capsule Take 40 mg by mouth daily.     potassium chloride SA (KLOR-CON M) 20 MEQ tablet Take 1 tablet (20 mEq total) by mouth daily. 30 tablet 3  pregabalin (LYRICA) 100 MG capsule Take 100 mg by mouth 3 (three) times daily.     rosuvastatin (CRESTOR) 5 MG tablet Take 5 mg by mouth daily.     spironolactone (ALDACTONE) 25 MG tablet Take 1 tablet (25 mg total) by mouth daily. 90 tablet 1   tamsulosin (FLOMAX) 0.4 MG CAPS capsule Take 1 capsule (0.4 mg total) by mouth daily. 90 capsule 0   torsemide (DEMADEX) 20 MG tablet Take 4 tablets (80 mg total) by mouth daily. 120 tablet 2   traZODone (DESYREL) 100 MG tablet Take 50 mg by mouth at bedtime.     amoxicillin (AMOXIL) 500 MG capsule Take 500 mg by mouth 3 (three) times daily. To be completed by 04/25/23 at noon - RX by Monia Pouch, DDS (Patient not taking: Reported on 05/28/2023)     No current facility-administered medications for this encounter.   Wt Readings from Last 3 Encounters:  05/28/23 80.6 kg (177 lb 9.6 oz)  05/21/23 78.5 kg (173 lb)  05/14/23 78.4 kg (172 lb 12.8 oz)   BP (!) 142/68   Pulse 77   Wt 80.6 kg (177 lb 9.6 oz)   SpO2 96%   BMI 26.23 kg/m  Physical Exam General:  NAD. No resp difficulty, walked into clinic HEENT: Normal Neck: Supple. JVP 10 Carotids 2+ bilat; no bruits. No lymphadenopathy or thryomegaly appreciated. Cor: PMI nondisplaced. Regular rate &  rhythm. No rubs, gallops or murmurs. Lungs: Faint crackles in bases Abdomen: Soft, nontender, nondistended. No hepatosplenomegaly. No bruits or masses. Good bowel sounds. Extremities: No cyanosis, clubbing, rash, edema Neuro: Alert & oriented x 3, cranial nerves grossly intact. Moves all 4 extremities w/o difficulty. Affect pleasant.  Assessment/Plan: 1. CAD: Occluded mid LAD and 80% proximal stenosis in nondominant RCA in 4/22.  No intervention.  No chest pain.  - Continue ASA 81 mg daily.  - Continue rosuvastatin, Repatha.  Good lipids 2/24.  2. Chronic systolic CHF: Ischemic cardiomyopathy.  Echo in 4/22 with EF < 20%, moderate-severe LV dilation, normal RV, severe LAE, severe MR.  RHC in 4/22 with CI 1.8.  TEE in 6/22 with EF <20%, dyssynchrony, mildly decreased RV systolic function.  Echo in 7/22 with EF < 20%, moderate MR.  St Jude CRT-D device placed.  Echo in 1/23 showed EF < 20%, mild RV dysfunction.  Echo in 9/23 showed EF 20-25%, mild MR. No change to echo since CRT but he has felt better symptomatically.  NYHA class IIb-III. He is volume overloaded on exam and by device interrogation, ReDs 42%.  GDMT has been limited by CKD. - Increase torsemide to 80 mg q am and add 40 mg q pm. - Add metolazone 2.5 mg + extra 40 KCL x 1 dose to take with tomorrow's dose of torsemide. BMET/BNP today, repeat BMET in 1 week (ok to get at [facility]). Can then use PRN. - Continue Toprol XL 25 mg at bedtime. - Continue Farxiga 10 mg daily.     - Continue spironolactone 25 mg daily.   - Continue digoxin 0.125 qod.  Dig level 0.9 (04/14/23). Repeat dig trough at repeat labs next week - Off Entresto and losartan with CKD.    3. Mitral regurgitation: Severe on 5/22 TTE. However, TEE in 6/22 showed moderate functional central MR.   Mild-moderate MR on echo 1/23 post-CRT, mild MR on 9/23 echo.  4. Elevated LFTs: Viral hepatitis labs negative.  Abdominal US did not show cirrhosis but did show ascites.  Etiology  uncertain, ?  congestive hepatopathy.  5. CKD stage 3b: Continue SGLT2i.  - BMET today.  6. Smoking: He quit in 7/23.  Follow up in 2 months with Dr. Shirlee Latch, as scheduled.  Anderson Malta [facility] FNP-BC 05/28/2023

## 2023-05-28 ENCOUNTER — Ambulatory Visit (HOSPITAL_COMMUNITY)
Admission: RE | Admit: 2023-05-28 | Discharge: 2023-05-28 | Disposition: A | Discharge status: Home / Self Care | Payer: Medicare Other | Source: Ambulatory Visit | Attending: Family Medicine | Admitting: Family Medicine

## 2023-05-28 ENCOUNTER — Encounter: Payer: Self-pay | Admitting: Oncology

## 2023-05-28 ENCOUNTER — Telehealth (HOSPITAL_COMMUNITY): Payer: Self-pay | Admitting: Emergency Medicine

## 2023-05-28 ENCOUNTER — Other Ambulatory Visit: Payer: Self-pay

## 2023-05-28 ENCOUNTER — Encounter (HOSPITAL_COMMUNITY): Payer: Self-pay

## 2023-05-28 ENCOUNTER — Other Ambulatory Visit (HOSPITAL_COMMUNITY): Payer: Self-pay

## 2023-05-28 ENCOUNTER — Other Ambulatory Visit (HOSPITAL_COMMUNITY): Payer: Self-pay | Admitting: Emergency Medicine

## 2023-05-28 VITALS — BP 142/68 | HR 77 | Wt 177.6 lb

## 2023-05-28 DIAGNOSIS — I130 Hypertensive heart and chronic kidney disease with heart failure and stage 1 through stage 4 chronic kidney disease, or unspecified chronic kidney disease: Secondary | ICD-10-CM | POA: Insufficient documentation

## 2023-05-28 DIAGNOSIS — R7989 Other specified abnormal findings of blood chemistry: Secondary | ICD-10-CM | POA: Diagnosis not present

## 2023-05-28 DIAGNOSIS — Z87891 Personal history of nicotine dependence: Secondary | ICD-10-CM | POA: Insufficient documentation

## 2023-05-28 DIAGNOSIS — I5022 Chronic systolic (congestive) heart failure: Secondary | ICD-10-CM | POA: Insufficient documentation

## 2023-05-28 DIAGNOSIS — Z79899 Other long term (current) drug therapy: Secondary | ICD-10-CM | POA: Diagnosis not present

## 2023-05-28 DIAGNOSIS — R188 Other ascites: Secondary | ICD-10-CM | POA: Insufficient documentation

## 2023-05-28 DIAGNOSIS — Z720 Tobacco use: Secondary | ICD-10-CM

## 2023-05-28 DIAGNOSIS — Z7982 Long term (current) use of aspirin: Secondary | ICD-10-CM | POA: Insufficient documentation

## 2023-05-28 DIAGNOSIS — I255 Ischemic cardiomyopathy: Secondary | ICD-10-CM | POA: Insufficient documentation

## 2023-05-28 DIAGNOSIS — I340 Nonrheumatic mitral (valve) insufficiency: Secondary | ICD-10-CM | POA: Insufficient documentation

## 2023-05-28 DIAGNOSIS — Z7984 Long term (current) use of oral hypoglycemic drugs: Secondary | ICD-10-CM | POA: Diagnosis not present

## 2023-05-28 DIAGNOSIS — I2510 Atherosclerotic heart disease of native coronary artery without angina pectoris: Secondary | ICD-10-CM | POA: Diagnosis not present

## 2023-05-28 DIAGNOSIS — N183 Chronic kidney disease, stage 3 unspecified: Secondary | ICD-10-CM

## 2023-05-28 DIAGNOSIS — N1832 Chronic kidney disease, stage 3b: Secondary | ICD-10-CM | POA: Diagnosis not present

## 2023-05-28 LAB — BASIC METABOLIC PANEL
Anion gap: 12 (ref 5–15)
BUN: 36 mg/dL — ABNORMAL HIGH (ref 8–23)
CO2: 26 mmol/L (ref 22–32)
Calcium: 9.4 mg/dL (ref 8.9–10.3)
Chloride: 103 mmol/L (ref 98–111)
Creatinine, Ser: 2.19 mg/dL — ABNORMAL HIGH (ref 0.61–1.24)
GFR, Estimated: 30 mL/min — ABNORMAL LOW (ref >60)
Glucose, Bld: 83 mg/dL (ref 70–99)
Potassium: 4.7 mmol/L (ref 3.5–5.1)
Sodium: 141 mmol/L (ref 135–145)

## 2023-05-28 LAB — BRAIN NATRIURETIC PEPTIDE: B Natriuretic Peptide: 1737.8 pg/mL — ABNORMAL HIGH (ref 0.0–100.0)

## 2023-05-28 MED ORDER — METOPROLOL SUCCINATE ER 25 MG PO TB24: 25.0000 mg | ORAL_TABLET | Freq: Every day | ORAL | Status: DC

## 2023-05-28 MED ORDER — TORSEMIDE 20 MG PO TABS
ORAL_TABLET | ORAL | 6 refills | Status: DC
  Filled 2023-05-28: qty 180, 30d supply, fill #0

## 2023-05-28 MED ORDER — ROSUVASTATIN CALCIUM 5 MG PO TABS
5.0000 mg | ORAL_TABLET | Freq: Every day | ORAL | 3 refills | Status: DC
  Filled 2023-05-28: qty 90, 90d supply, fill #0

## 2023-05-28 MED ORDER — DAPAGLIFLOZIN PROPANEDIOL 10 MG PO TABS
10.0000 mg | ORAL_TABLET | Freq: Every day | ORAL | 3 refills | Status: DC
  Filled 2023-05-28: qty 90, 90d supply, fill #0
  Filled 2023-08-06 – 2023-08-27 (×4): qty 90, 90d supply, fill #1

## 2023-05-28 MED ORDER — POTASSIUM CHLORIDE CRYS ER 20 MEQ PO TBCR
20.0000 meq | EXTENDED_RELEASE_TABLET | Freq: Every day | ORAL | 3 refills | Status: DC
  Filled 2023-05-28 – 2023-06-30 (×3): qty 30, 30d supply, fill #0
  Filled 2023-08-06 (×2): qty 30, 30d supply, fill #1

## 2023-05-28 MED ORDER — SPIRONOLACTONE 25 MG PO TABS
25.0000 mg | ORAL_TABLET | Freq: Every day | ORAL | 1 refills | Status: DC
  Filled 2023-05-28: qty 90, 90d supply, fill #0

## 2023-05-28 MED ORDER — METOLAZONE 2.5 MG PO TABS
2.5000 mg | ORAL_TABLET | ORAL | 0 refills | Status: DC
Start: 2023-05-28 — End: 2023-08-26
  Filled 2023-05-28: qty 10, 30d supply, fill #0
  Filled 2023-06-04 (×2): qty 10, 10d supply, fill #0

## 2023-05-28 MED ORDER — TORSEMIDE 20 MG PO TABS
ORAL_TABLET | ORAL | 6 refills | Status: DC
  Filled 2023-05-28 – 2023-08-06 (×5): qty 180, 30d supply, fill #0

## 2023-05-28 NOTE — Progress Notes (Signed)
Paramedicine Encounter   Patient ID: Tommy Ward , male,   DOB: Apr 27, 1941,82 y.o.,  MRN: [id number]   Met with pt at clinic today with Prince Rome, NP. Pt reported in the lobby an incident on Sunday of being awakened from sleep to shortness of breath. Pt reproted that he "Googled" the symptoms and practiced some breathing techniques. Pt reported while he was breathing in through his nose and out through pursed lips, he felt okay, but continued to feel it from 4 am till noon. Pt reported since he has not been feeling himself and getting winded with small amounts of exertion. Same was brought up to provider. With that and his noted fluid increase, Jessica increased torsemide and prescribed a single dose of metolazone for tomorrow. I filled pill box to reflect changes for 1x week. I followed up with pharmacy to ensure that it would be delivered and they advised that it would be on the truck for the morning. Will follow up in the morning to ensure compliance.  Weight @ clinic - 177.6lbs B/P-142/68 P-77 SP02-96%  Med changes- Increased torsemide 40 mEq in afternoon, +1 metolozone dose tomorrow AM w/ +41mEq  of potassium tomorrow. Hold digoxin on lab day.   Benson Setting EMT-P Community Paramedic  [phone]

## 2023-05-28 NOTE — Progress Notes (Addendum)
ReDS Vest / Clip - 05/28/23 1400       ReDS Vest / Clip   Station Marker D    Ruler Value 38    ReDS Value Range High volume overload    ReDS Actual Value 43

## 2023-05-28 NOTE — Telephone Encounter (Signed)
Called and left voice mail message for Susano to remind him of today's appointment.   Texted wife to confirm attendance and she advised that they would be there.   Will meet both at [facility] appointment at 2.   Benson Setting EMT-P Community Paramedic  [phone]

## 2023-05-28 NOTE — Patient Instructions (Addendum)
Thank you for coming in today  If you had labs drawn today, any labs that are abnormal the clinic will call you No news is good news  Medications: Metolazone 2.5 mg with 40 meq of Potassium on 05/29/2023 Increase Torsemide to 80 mg every morning and 40 mg every evening Follow up appointments:  Your physician recommends that you schedule a follow-up appointment in:  Please keep follow up appointment with Dr. Shirlee Latch    Your physician recommends that you return for lab work in:  7 days please go to [facility] to get labs drawn you were given a prescription for lab work Hold digoxin for lab draw and take after labs are drawn  Do the following things EVERYDAY: Weigh yourself in the morning before breakfast. Write it down and keep it in a log. Take your medicines as prescribed Eat low salt foods--Limit salt (sodium) to 2000 mg per day.  Stay as active as you can everyday Limit all fluids for the day to less than 2 liters   At the [facility], you and your health needs are our priority. As part of our continuing mission to provide you with exceptional heart care, we have created designated Provider Care Teams. These Care Teams include your primary Cardiologist (physician) and Advanced Practice Providers (APPs- Physician Assistants and Nurse Practitioners) who all work together to provide you with the care you need, when you need it.   You may see any of the following providers on your designated Care Team at your next follow up: Dr Arvilla Meres Dr Marca Ancona Dr. Marcos Eke, NP Robbie Lis, [place] [facility] [place], [place] Brynda Peon, NP Karle Plumber, PharmD   Please be sure to bring in all your medications bottles to every appointment.    Thank you for choosing [facility]  If you have any questions or concerns before your next appointment please send [place] a message through [place] or call our  office at [phone].    TO LEAVE A MESSAGE FOR THE NURSE SELECT OPTION 2, PLEASE LEAVE A MESSAGE INCLUDING: YOUR NAME DATE OF BIRTH CALL BACK NUMBER REASON FOR CALL**this is important as we prioritize the call backs  YOU WILL RECEIVE A CALL BACK THE SAME DAY AS LONG AS YOU CALL BEFORE 4:00 PM

## 2023-05-29 ENCOUNTER — Telehealth (HOSPITAL_COMMUNITY): Payer: Self-pay | Admitting: *Deleted

## 2023-05-29 ENCOUNTER — Other Ambulatory Visit (HOSPITAL_COMMUNITY): Payer: Self-pay

## 2023-05-29 ENCOUNTER — Other Ambulatory Visit: Payer: Self-pay

## 2023-05-29 ENCOUNTER — Telehealth (HOSPITAL_COMMUNITY): Payer: Self-pay | Admitting: Emergency Medicine

## 2023-05-29 NOTE — Telephone Encounter (Signed)
Called to check in as Donato was set to take his first dose of metolazone today. [name] advised that they had not received it as of yet.   I contacted [facility] and they advised that it was being filled and should be delivered tomorrow.   I instructed the [name] to NOT let Kato take his extra 40 mEq of potassium that was placed in his box today as he has not received his Metolazone and informed him to ensure that he does take that extra when he does take his Metolazone, once received.   I also reiterated lab findings and that there was no changes to the medication plan set at his visit.   Wife understood and reiterated same.   Will follow up next week  Benson Setting EMT-P Community Paramedic  [phone]

## 2023-05-29 NOTE — Telephone Encounter (Signed)
Tommy Ward has recently been having difficulties with multiple pharmacies. To consolidate pharmacies, he has requested that all prescriptions go to [facility] for delivery.   Thank you,  Benson Setting EMT-P Community Paramedic  [phone]

## 2023-05-29 NOTE — Telephone Encounter (Signed)
Called patient's wife per Prince Rome, NP with following:  "BNP up, no change to plan discussed at visit."  Wife verbalized understanding of same. No further questions at this time.

## 2023-05-30 ENCOUNTER — Encounter (HOSPITAL_COMMUNITY): Payer: Self-pay

## 2023-05-30 ENCOUNTER — Other Ambulatory Visit (HOSPITAL_COMMUNITY): Payer: Self-pay

## 2023-06-01 ENCOUNTER — Other Ambulatory Visit: Payer: Self-pay | Admitting: Internal Medicine

## 2023-06-02 ENCOUNTER — Other Ambulatory Visit (HOSPITAL_COMMUNITY): Payer: Self-pay

## 2023-06-04 ENCOUNTER — Other Ambulatory Visit (HOSPITAL_COMMUNITY): Payer: Self-pay

## 2023-06-04 ENCOUNTER — Other Ambulatory Visit (HOSPITAL_COMMUNITY): Payer: Self-pay | Admitting: Emergency Medicine

## 2023-06-04 ENCOUNTER — Other Ambulatory Visit: Payer: Self-pay

## 2023-06-04 ENCOUNTER — Encounter: Payer: Self-pay | Admitting: Oncology

## 2023-06-04 NOTE — Progress Notes (Signed)
Paramedicine Encounter    Patient ID: Tommy Ward, male    DOB: [birth year], 82 y.o.   MRN: [id number]   Complaints - continued exertional shortness of breath, feels like there is fluid noted on his abdomen, abdomen feels tighter than usual,   Assessment - abdomen noted to be tighter than baseline, wrists and hands noted without fluid. Left leg noted to have mild edema, non-pitting. Lung sounds clear.   Compliance with meds - no missed doses   Pill box filled - for 1x week.   Refills needed - metoprolol, farxiga, metolazone  Meds changes since last visit - 1 dose of metolazone + 40 mEq Potassium    Social changes - none   VISIT SUMMARY** Met with pt in the home today. Pt appeared fatigued and reported no change in status since his appointment last week. Pt reported continued exertional shortness of breath and increased fatigue. Pt has been unable to take Metolazone dose due to delay from pharmacy. I spoke directly with [facility] representative and they advised it would be delivered by Friday. Will follow up on same tomorrow. Pt presented with a tighter abdomen than usual with minimal swelling noted in his left leg, non-pitting. Pt's pill box was filled for 1x week. Upcoming appointments were reviewed. Will follow up in 1x week.   Pt was instructed that on the arrival of metolazone, to take 1x pill, 2.5mg  total. With 2x pills of potassium, total. Pt was reminded that 1x week after taking, he needed to be reevaluated for labs. Pt understood and agreed.   Benson Setting EMT-P Community Paramedic  [phone]    BP 130/76   Pulse 72   Resp 16   Wt 174 lb 6.4 oz (79.1 kg)   SpO2 95%   BMI 25.75 kg/m  Weight yesterday-DNW Last visit weight-177 lbs   Benson Setting EMT-P Community Paramedic  [phone]     ACTION: Home visit completed     Patient Care Team: Buckner Malta, MD as PCP - General (Family Medicine) Rennis Golden Lisette Abu, MD as PCP -  Cardiology (Cardiology) Lanier Prude, MD as PCP - Electrophysiology (Cardiology) Weston Settle, MD as Consulting Physician (Oncology) Dora Sims, DDS as Referring Physician (Oral Surgery) Debroah Baller, MD as Consulting Physician (Urology)  Patient Active Problem List   Diagnosis Date Noted   Vitamin D toxicity 07/24/2022   Anemia due to stage 4 chronic kidney disease (HCC) 03/20/2022   Hypercalcemia of malignancy 02/16/2021   Chronic systolic heart failure (HCC)    Abnormal stress test 12/05/2020   Depressed left ventricular ejection fraction 12/05/2020   Nonrheumatic mitral valve regurgitation 12/05/2020   Pre-diabetes 12/05/2020   Pulmonary hypertension (HCC) 12/05/2020   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HCC) 12/05/2020   Tobacco use 12/05/2020   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   HFrEF (heart failure with reduced ejection fraction) (HCC) 11/23/2020   CAD (coronary artery disease) 11/23/2020   GERD (gastroesophageal reflux disease)    Hyperlipidemia    Hypertension    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   Malignant neoplasm of prostate (HCC) 06/02/2020   AKI (acute kidney injury) (HCC) 11/25/2019   Hyperphosphatemia 11/25/2019   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   Hyperkalemia 08/24/2019   Metabolic bone disease 08/24/2019   Vitamin D deficiency 08/24/2019   Benign hypertension with chronic kidney disease, stage III (HCC) 05/20/2019   Decreased cardiac  ejection fraction 05/20/2019   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) 05/20/2019   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 02/24/2019   Olecranon bursitis of left elbow 06/25/2018   B12 deficiency 03/22/2018   Gastroesophageal reflux disease without esophagitis 03/22/2018   Chronic renal insufficiency, stage III  (moderate) (HCC) 11/27/2015   Aortic valve disorder 07/08/2014   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   Cataract 11/2013   Essential hypertension 07/06/2012   Coronary arteriosclerosis 07/06/2012   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   Left bundle branch block 07/06/2012   Mixed hyperlipidemia 07/06/2012   Cancer (HCC) 08/2008   S/P radiation therapy > 12 wks ago 2010    Current Outpatient Medications:    aspirin EC 81 MG tablet, Take 1 tablet (81 mg total) by mouth daily., Disp: 90 tablet, Rfl: 3   Cetirizine HCl 10 MG CAPS, Take 1 capsule by mouth daily., Disp: , Rfl:    cholestyramine light (PREVALITE) 4 g packet, Take 4 g by mouth 2 (two) times daily as needed., Disp: , Rfl:    dapagliflozin propanediol (FARXIGA) 10 MG TABS tablet, Take 1 tablet (10 mg total) by mouth daily before breakfast., Disp: 90 tablet, Rfl: 3   digoxin (LANOXIN) 0.125 MG tablet, Take 1 tablet (0.125 mg total) by mouth every other day., Disp: 45 tablet, Rfl: 3   enzalutamide (XTANDI) 40 MG tablet, Take 160 mg by mouth daily., Disp: , Rfl:    Evolocumab (REPATHA SURECLICK) 140 MG/ML SOAJ, INJECT 1 DOSE UNDER THE SKIN EVERY 14 DAYS, Disp: 6 mL, Rfl: 3   FISH OIL-KRILL OIL PO, Take 500 mg by mouth daily., Disp: , Rfl:    metoprolol succinate (TOPROL XL) 25 MG 24 hr tablet, Take 1 tablet (25 mg total) by mouth at bedtime., Disp: , Rfl:    Multiple Vitamins-Minerals (PRESERVISION/LUTEIN) CAPS, Take 1 capsule by mouth at bedtime., Disp: , Rfl:    omeprazole (PRILOSEC) 40 MG capsule, Take 40 mg by mouth daily., Disp: , Rfl:    potassium chloride SA (KLOR-CON M) 20 MEQ tablet, Take 1 tablet (20 mEq total) by mouth daily., Disp: 30 tablet, Rfl: 3   pregabalin (LYRICA) 100 MG capsule, Take 100 mg by mouth 3 (three) times daily., Disp: , Rfl:    rosuvastatin (CRESTOR) 5 MG tablet, Take 1 tablet (5 mg total) by mouth daily., Disp: 90 tablet, Rfl: 3   spironolactone (ALDACTONE) 25 MG tablet,  Take 1 tablet (25 mg total) by mouth daily., Disp: 90 tablet, Rfl: 1   tamsulosin (FLOMAX) 0.4 MG CAPS capsule, Take 1 capsule (0.4 mg total) by mouth daily., Disp: 90 capsule, Rfl: 0   amoxicillin (AMOXIL) 500 MG capsule, Take 500 mg by mouth 3 (three) times daily. To be completed by 04/25/23 at noon - RX by Monia Pouch, DDS, Disp: , Rfl:    metolazone (ZAROXOLYN) 2.5 MG tablet, Take 1 tablet (2.5 mg total) by mouth as directed. Only take as directed by the advanced heart failure clinic (Patient not taking: Reported on 06/04/2023), Disp: 10 tablet, Rfl: 0   torsemide (DEMADEX) 20 MG tablet, Take 4 tablets (80 mg total) by mouth in the morning AND 2 tablets (40 mg total) every evening., Disp: 180 tablet, Rfl: 6   traZODone (DESYREL) 100 MG tablet, Take 50 mg by mouth at bedtime., Disp: , Rfl:  Allergies  Allergen Reactions   Ezetimibe Other (See Comments)    Myalgia   Gabapentin Itching  and Other (See Comments)    Sore throat, breakouts   Statins Other (See Comments)    Myalgias (intolerance)     Social History   Socioeconomic History   Marital status: Married    Spouse name: Not on file   Number of children: Not on file   Years of education: Not on file   Highest education level: Not on file  Occupational History   Not on file  Tobacco Use   Smoking status: Every Day    Current packs/day: 0.50    Average packs/day: 0.5 packs/day for 50.0 years (25.0 ttl pk-yrs)    Types: Cigarettes   Smokeless tobacco: Never  Vaping Use   Vaping status: Never Used  Substance and Sexual Activity   Alcohol use: Not Currently   Drug use: Never   Sexual activity: Not on file  Other Topics Concern   Not on file  Social History Narrative   Not on file   Social Determinants of Health   Financial Resource Strain: Low Risk  (05/22/2022)   Overall Financial Resource Strain (CARDIA)    Difficulty of Paying Living Expenses: Not very hard  Food Insecurity: No Food Insecurity (05/22/2022)   Hunger Vital  Sign    Worried About Running Out of Food in the Last Year: Never true    Ran Out of Food in the Last Year: Never true  Transportation Needs: No Transportation Needs (05/22/2022)   PRAPARE - Administrator, Civil Service (Medical): No    Lack of Transportation (Non-Medical): No  Physical Activity: Not on file  Stress: Not on file  Social Connections: Not on file  Intimate Partner Violence: Not on file    Physical Exam      Future Appointments  Date Time Provider Department Center  06/12/2023  9:30 AM [facility] None  06/13/2023  9:30 AM Weston Settle, MD [facility] None  06/16/2023 10:15 AM Standiford, Jenelle Mages, DPM [facility]  06/23/2023  3:40 PM [facility] DEVICE REMOTES [facility]  08/04/2023  2:00 PM Laurey Morale, MD [facility] None  09/22/2023  3:40 PM [facility] DEVICE REMOTES [facility]  12/22/2023  3:40 PM [facility] DEVICE REMOTES [facility]  03/22/2024  3:40 PM [facility] DEVICE REMOTES [facility]  06/21/2024  3:40 PM [facility] DEVICE REMOTES [facility]  09/20/2024  3:40 PM [facility] DEVICE REMOTES [facility]  12/20/2024  3:40 PM [facility] DEVICE REMOTES [facility]  03/21/2025  3:40 PM [facility] DEVICE REMOTES [facility]  06/20/2025  3:40 PM [facility] DEVICE REMOTES [facility]  09/19/2025  3:40 PM [facility] DEVICE REMOTES [facility]               -

## 2023-06-05 ENCOUNTER — Other Ambulatory Visit (HOSPITAL_COMMUNITY): Payer: Self-pay

## 2023-06-05 ENCOUNTER — Telehealth (HOSPITAL_COMMUNITY): Payer: Self-pay | Admitting: Cardiology

## 2023-06-05 MED ORDER — METOPROLOL SUCCINATE ER 25 MG PO TB24
25.0000 mg | ORAL_TABLET | Freq: Every day | ORAL | 3 refills | Status: DC
  Filled 2023-06-05: qty 90, 90d supply, fill #0
  Filled 2023-08-06 (×2): qty 90, 90d supply, fill #1

## 2023-06-05 MED ORDER — METOLAZONE 2.5 MG PO TABS: 2.5000 mg | ORAL_TABLET | ORAL | 0 refills | Status: AC

## 2023-06-05 NOTE — Telephone Encounter (Signed)
-----   Message from [facility] [name] D sent at 06/04/2023  2:14 PM EDT ----- Regarding: Metolazone Hey all,   Tommy Ward has not received his metolazone from [facility] that was promised to arrive last Thursday.   Wife has requested to move to the prescription to [facility] in [place] so they can be compliant with APPS Appointment instructions.   Thanks,  Benson Setting EMT-P Community Paramedic  [phone]

## 2023-06-05 NOTE — Telephone Encounter (Signed)
Meds sent to pharmacy.

## 2023-06-06 ENCOUNTER — Encounter: Payer: Self-pay | Admitting: Oncology

## 2023-06-06 ENCOUNTER — Other Ambulatory Visit (HOSPITAL_COMMUNITY): Payer: Self-pay

## 2023-06-09 ENCOUNTER — Telehealth (HOSPITAL_COMMUNITY): Payer: Self-pay | Admitting: Emergency Medicine

## 2023-06-09 NOTE — Telephone Encounter (Signed)
Contacted Tommy Ward to get an update on the delivery of Metolazone. She reported that it was delivered on Friday as promised and that he took it yesterday with his additional potassium. Pt was reminded of follow up labs and wife reported that they had plans to complete those labs on Monday the 28th.   Will follow up in the home on Wednesday.   [place]enson Setting EMT-P Community Paramedic  [phone]

## 2023-06-10 ENCOUNTER — Other Ambulatory Visit (HOSPITAL_COMMUNITY): Payer: Self-pay

## 2023-06-10 ENCOUNTER — Telehealth (HOSPITAL_COMMUNITY): Payer: Self-pay

## 2023-06-10 NOTE — Telephone Encounter (Signed)
Advanced Heart Failure Patient Advocate Encounter  Prior authorization for Metolazone has been submitted and approved. Test billing returns $3.33 for 10 day supply.  [employer] Slim Effective: 06/06/2023 to 06/05/2024  Burnell Blanks, CPhT Rx Patient Advocate Phone: [phone]

## 2023-06-11 ENCOUNTER — Other Ambulatory Visit (HOSPITAL_COMMUNITY): Payer: Self-pay | Admitting: Emergency Medicine

## 2023-06-11 NOTE — Progress Notes (Signed)
Paramedicine Encounter    Patient ID: Tommy Ward, male    DOB: [birth year], 82 y.o.   MRN: [id number]   Complaints - increased thirst   Assessment - Lung sounds clear, no fluid on legs or abdomen noted.   Compliance with meds - Missed 1x torsemide dose   Pill box filled - for 1x week    Refills needed - xtandi  Meds changes since last visit - Completed his metolazone regiment, scheduled to get labs on Monday.     Social changes - none    VISIT SUMMARY**  Met with Larrie in the home today. Pt reported that he did not see much effects from the metolazone dose, however reported it increased his thirst. Pt did report that he had returned back to his baseline of exertional shortness of breath and was feeling fine. Pt's pill box was filled for 1x week. Upcoming appointments reviewed. Will follow up in 1x week.    BP 124/66   Pulse 70   Resp 16   Wt 173 lb (78.5 kg)   SpO2 96%   BMI 25.55 kg/m  Weight yesterday-DNW Last visit weight-174 lbs   Benson Setting EMT-P Community Paramedic  [phone]     ACTION: Home visit completed     Patient Care Team: Buckner Malta, MD as PCP - General (Family Medicine) Rennis Golden, Lisette Abu, MD as PCP - Cardiology (Cardiology) Lanier Prude, MD as PCP - Electrophysiology (Cardiology) Weston Settle, MD as Consulting Physician (Oncology) Dora Sims, DDS as Referring Physician (Oral Surgery) Debroah Baller, MD as Consulting Physician (Urology)  Patient Active Problem List   Diagnosis Date Noted   Vitamin D toxicity 07/24/2022   Anemia due to stage 4 chronic kidney disease (HCC) 03/20/2022   Hypercalcemia of malignancy 02/16/2021   Chronic systolic heart failure (HCC)    Abnormal stress test 12/05/2020   Depressed left ventricular ejection fraction 12/05/2020   Nonrheumatic mitral valve regurgitation 12/05/2020   Pre-diabetes 12/05/2020   Pulmonary hypertension (HCC) 12/05/2020   Nonrheumatic tricuspid valve  regurgitation 12/05/2020   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HCC) 12/05/2020   Tobacco use 12/05/2020   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   HFrEF (heart failure with reduced ejection fraction) (HCC) 11/23/2020   CAD (coronary artery disease) 11/23/2020   GERD (gastroesophageal reflux disease)    Hyperlipidemia    Hypertension    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   Malignant neoplasm of prostate (HCC) 06/02/2020   AKI (acute kidney injury) (HCC) 11/25/2019   Hyperphosphatemia 11/25/2019   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   Hyperkalemia 08/24/2019   Metabolic bone disease 08/24/2019   Vitamin D deficiency 08/24/2019   Benign hypertension with chronic kidney disease, stage III (HCC) 05/20/2019   Decreased cardiac ejection fraction 05/20/2019   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) 05/20/2019   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 02/24/2019   Olecranon bursitis of left elbow 06/25/2018   B12 deficiency 03/22/2018   Gastroesophageal reflux disease without esophagitis 03/22/2018   Chronic renal insufficiency, stage III (moderate) (HCC) 11/27/2015   Aortic valve disorder 07/08/2014   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   Cataract 11/2013   Essential hypertension 07/06/2012   Coronary arteriosclerosis 07/06/2012   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   Left bundle branch block 07/06/2012   Mixed hyperlipidemia 07/06/2012   Cancer (HCC) 08/2008   S/P radiation therapy >  12 wks ago 2010    Current Outpatient Medications:    aspirin EC 81 MG tablet, Take 1 tablet (81 mg total) by mouth daily., Disp: 90 tablet, Rfl: 3   Cetirizine HCl 10 MG CAPS, Take 1 capsule by mouth daily., Disp: , Rfl:    cholestyramine light (PREVALITE) 4 g packet, Take 4 g by mouth 2 (two) times daily as needed., Disp: , Rfl:     dapagliflozin propanediol (FARXIGA) 10 MG TABS tablet, Take 1 tablet (10 mg total) by mouth daily before breakfast., Disp: 90 tablet, Rfl: 3   digoxin (LANOXIN) 0.125 MG tablet, Take 1 tablet (0.125 mg total) by mouth every other day., Disp: 45 tablet, Rfl: 3   enzalutamide (XTANDI) 40 MG tablet, Take 160 mg by mouth daily., Disp: , Rfl:    Evolocumab (REPATHA SURECLICK) 140 MG/ML SOAJ, INJECT 1 DOSE UNDER THE SKIN EVERY 14 DAYS, Disp: 6 mL, Rfl: 3   FISH OIL-KRILL OIL PO, Take 500 mg by mouth daily., Disp: , Rfl:    metoprolol succinate (TOPROL XL) 25 MG 24 hr tablet, Take 1 tablet (25 mg total) by mouth at bedtime., Disp: 90 tablet, Rfl: 3   Multiple Vitamins-Minerals (PRESERVISION/LUTEIN) CAPS, Take 1 capsule by mouth at bedtime., Disp: , Rfl:    omeprazole (PRILOSEC) 40 MG capsule, Take 40 mg by mouth daily., Disp: , Rfl:    potassium chloride SA (KLOR-CON M) 20 MEQ tablet, Take 1 tablet (20 mEq total) by mouth daily., Disp: 30 tablet, Rfl: 3   pregabalin (LYRICA) 100 MG capsule, Take 100 mg by mouth 3 (three) times daily., Disp: , Rfl:    rosuvastatin (CRESTOR) 5 MG tablet, Take 1 tablet (5 mg total) by mouth daily., Disp: 90 tablet, Rfl: 3   spironolactone (ALDACTONE) 25 MG tablet, Take 1 tablet (25 mg total) by mouth daily., Disp: 90 tablet, Rfl: 1   tamsulosin (FLOMAX) 0.4 MG CAPS capsule, Take 1 capsule (0.4 mg total) by mouth daily., Disp: 90 capsule, Rfl: 0   torsemide (DEMADEX) 20 MG tablet, Take 4 tablets (80 mg total) by mouth in the morning AND 2 tablets (40 mg total) every evening., Disp: 180 tablet, Rfl: 6   traZODone (DESYREL) 100 MG tablet, Take 50 mg by mouth at bedtime., Disp: , Rfl:    amoxicillin (AMOXIL) 500 MG capsule, Take 500 mg by mouth 3 (three) times daily. To be completed by 04/25/23 at noon - RX by Monia Pouch, DDS, Disp: , Rfl:    metolazone (ZAROXOLYN) 2.5 MG tablet, Take 1 tablet (2.5 mg total) by mouth as directed. Only take as directed by the advanced heart failure clinic  (Patient not taking: Reported on 06/11/2023), Disp: 10 tablet, Rfl: 0 Allergies  Allergen Reactions   Ezetimibe Other (See Comments)    Myalgia   Gabapentin Itching and Other (See Comments)    Sore throat, breakouts   Statins Other (See Comments)    Myalgias (intolerance)     Social History   Socioeconomic History   Marital status: Married    Spouse name: Not on file   Number of children: Not on file   Years of education: Not on file   Highest education level: Not on file  Occupational History   Not on file  Tobacco Use   Smoking status: Every Day    Current packs/day: 0.50    Average packs/day: 0.5 packs/day for 50.0 years (25.0 ttl pk-yrs)    Types: Cigarettes   Smokeless tobacco: Never  Vaping Use  Vaping status: Never Used  Substance and Sexual Activity   Alcohol use: Not Currently   Drug use: Never   Sexual activity: Not on file  Other Topics Concern   Not on file  Social History Narrative   Not on file   Social Determinants of Health   Financial Resource Strain: Low Risk  (05/22/2022)   Overall Financial Resource Strain (CARDIA)    Difficulty of Paying Living Expenses: Not very hard  Food Insecurity: No Food Insecurity (05/22/2022)   Hunger Vital Sign    Worried About Running Out of Food in the Last Year: Never true    Ran Out of Food in the Last Year: Never true  Transportation Needs: No Transportation Needs (05/22/2022)   PRAPARE - Administrator, Civil Service (Medical): No    Lack of Transportation (Non-Medical): No  Physical Activity: Not on file  Stress: Not on file  Social Connections: Not on file  Intimate Partner Violence: Not on file    Physical Exam      Future Appointments  Date Time Provider Department Center  06/12/2023  9:30 AM [facility] None  06/13/2023  9:30 AM Weston Settle, MD [facility] None  06/16/2023 10:15 AM Standiford, Jenelle Mages, DPM [facility]  06/23/2023  3:40 PM [facility] DEVICE  REMOTES [facility]  08/04/2023  2:00 PM Laurey Morale, MD [facility] None  09/22/2023  3:40 PM [facility] DEVICE REMOTES [facility]  12/22/2023  3:40 PM [facility] DEVICE REMOTES [facility]  03/22/2024  3:40 PM [facility] DEVICE REMOTES [facility]  06/21/2024  3:40 PM [facility] DEVICE REMOTES [facility]  09/20/2024  3:40 PM [facility] DEVICE REMOTES [facility]  12/20/2024  3:40 PM [facility] DEVICE REMOTES [facility]  03/21/2025  3:40 PM [facility] DEVICE REMOTES [facility]  06/20/2025  3:40 PM [facility] DEVICE REMOTES [facility]  09/19/2025  3:40 PM [facility] DEVICE REMOTES [facility]

## 2023-06-12 ENCOUNTER — Inpatient Hospital Stay: Payer: Medicare Other | Attending: Oncology

## 2023-06-12 DIAGNOSIS — C7951 Secondary malignant neoplasm of bone: Secondary | ICD-10-CM | POA: Diagnosis present

## 2023-06-12 DIAGNOSIS — C61 Malignant neoplasm of prostate: Secondary | ICD-10-CM | POA: Insufficient documentation

## 2023-06-12 LAB — PSA: Prostatic Specific Antigen: 0.07 ng/mL (ref 0.00–4.00)

## 2023-06-12 NOTE — Progress Notes (Signed)
[facility]  [address] [place],  [place]  [address] [phone]  Clinic Day:  06/13/2023  Referring physician: Buckner Malta, MD   HISTORY OF PRESENT ILLNESS:  The patient is an 82 y.o. 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  He is supposed to be on Trelstar as well, but has not had a Trelstar shot at his urologist's office in numerous months.  He comes in today for routine followup.  Since his last visit, the patient has been doing well.  He denies having any systemic symptoms which concern him for overt progression of his metastatic prostate cancer.  PHYSICAL EXAM:  Blood pressure (!) 122/56, pulse 69, temperature 97.8 F (36.6 C), resp. rate 18, height 5\' 9"  (1.753 m), weight 173 lb (78.5 kg), SpO2 97%. Wt Readings from Last 3 Encounters:  06/13/23 173 lb (78.5 kg)  06/11/23 173 lb (78.5 kg)  06/04/23 174 lb 6.4 oz (79.1 kg)   Body mass index is 25.55 kg/m. Performance status (ECOG): 1 - Symptomatic but completely ambulatory Physical Exam Constitutional:      Appearance: Normal appearance. He is not ill-appearing.  HENT:     Mouth/Throat:     Mouth: Mucous membranes are moist.     Pharynx: Oropharynx is clear. No oropharyngeal exudate or posterior oropharyngeal erythema.  Cardiovascular:     Rate and Rhythm: Normal rate and regular rhythm.     Heart sounds: No murmur heard.    No friction rub. No gallop.  Pulmonary:     Effort: Pulmonary effort is normal. No respiratory distress.     Breath sounds: Normal breath sounds. No wheezing, rhonchi or rales.  Abdominal:     General: Bowel sounds are normal. There is no distension.     Palpations: Abdomen is soft. There is no mass.     Tenderness: There is no abdominal tenderness.  Musculoskeletal:        General: No swelling.     Right lower leg: No edema.     Left lower leg: No edema.  Lymphadenopathy:     Cervical: No cervical  adenopathy.     Upper Body:     Right upper body: No supraclavicular or axillary adenopathy.     Left upper body: No supraclavicular or axillary adenopathy.     Lower Body: No right inguinal adenopathy. No left inguinal adenopathy.  Skin:    General: Skin is warm.     Coloration: Skin is not jaundiced.     Findings: No lesion or rash.  Neurological:     General: No focal deficit present.     Mental Status: He is alert and oriented to person, place, and time. Mental status is at baseline.  Psychiatric:        Mood and Affect: Mood normal.        Behavior: Behavior normal.        Thought Content: Thought content normal.    LABS:    Latest Reference Range & Units 01/27/23 10:30 06/12/23 09:20  Prostatic Specific Antigen 0.00 - 4.00 ng/mL 0.02 0.07    ASSESSMENT & PLAN:  Assessment/Plan:  An 82 y.o. male with metastatic prostate cancer.  Although his PSA remains at an undetectable level (<.1), it has been rising within an undetectable level.  For now, he will continue taking his enzalutamide daily as it appears to be effective as a single agent in controlling his disease.  Clinically, the patient is doing well.  I will see him back in 4 months for repeat clinical assessment.  If his PSA becomes detectable again, I would likely add Lupron to get him back on complete androgen blockade.  The patient understands all the plans discussed today and is in agreement with them.    Maila Dukes Kirby Funk, MD

## 2023-06-13 ENCOUNTER — Other Ambulatory Visit: Payer: Self-pay | Admitting: Oncology

## 2023-06-13 ENCOUNTER — Inpatient Hospital Stay (INDEPENDENT_AMBULATORY_CARE_PROVIDER_SITE_OTHER): Payer: Medicare Other | Admitting: Oncology

## 2023-06-13 VITALS — BP 122/56 | HR 69 | Temp 97.8°F | Resp 18 | Ht 69.0 in | Wt 173.0 lb

## 2023-06-13 DIAGNOSIS — C61 Malignant neoplasm of prostate: Secondary | ICD-10-CM

## 2023-06-16 ENCOUNTER — Ambulatory Visit (INDEPENDENT_AMBULATORY_CARE_PROVIDER_SITE_OTHER): Payer: Medicare Other | Admitting: Podiatry

## 2023-06-16 ENCOUNTER — Telehealth: Payer: Self-pay | Admitting: Oncology

## 2023-06-16 DIAGNOSIS — Z91199 Patient's noncompliance with other medical treatment and regimen due to unspecified reason: Secondary | ICD-10-CM

## 2023-06-16 NOTE — Progress Notes (Signed)
 Patient absent for apointment

## 2023-06-16 NOTE — Telephone Encounter (Signed)
06/16/23 Spoke with wife and confirmed next appts.

## 2023-06-17 ENCOUNTER — Encounter: Payer: Self-pay | Admitting: Internal Medicine

## 2023-06-18 ENCOUNTER — Other Ambulatory Visit (HOSPITAL_COMMUNITY): Payer: Self-pay | Admitting: Emergency Medicine

## 2023-06-18 ENCOUNTER — Telehealth: Payer: Self-pay

## 2023-06-18 DIAGNOSIS — I5022 Chronic systolic (congestive) heart failure: Secondary | ICD-10-CM

## 2023-06-18 NOTE — Telephone Encounter (Signed)
Paper order placed in the computer for [facility].

## 2023-06-18 NOTE — Progress Notes (Signed)
Paramedicine Encounter    Patient ID: Tommy Ward, male    DOB: [birth year], 82 y.o.   MRN: [id number]   Complaints - continued exertional shortness of breath.   Assessment - Lung sounds clear, no edema noted.   Compliance with meds - no missed doses   Pill box filled - for 1x week.   Refills needed - none  Meds changes since last visit - none    Social changes - none     VISIT SUMMARY**  Met with [facility] in the home today. Pt reported no change in his shortness of breath or "winded"- ness. Pt reports no new symptoms. Pt reports getting updated labs today. Pt denied any chest pain, palpitations, or dizziness. Pt's lung sounds were clear and no edema noted. Pt's pill box was filled for 1x week. Upcoming appointments were reviewed. Will follow up in 1x week.   BP 128/70   Pulse 72   Resp 16   Wt 173 lb 6.4 oz (78.7 kg)   SpO2 96%   BMI 25.61 kg/m  Weight yesterday- 173.4 lbs  Last visit weight-173 lbs   [facility]-P [facility]  [phone]      ACTION: Home visit completed     Patient Care Team: Buckner Malta, MD as PCP - General (Family Medicine) Rennis Golden Lisette Abu, MD as PCP - Cardiology (Cardiology) Lanier Prude, MD as PCP - Electrophysiology (Cardiology) Weston Settle, MD as Consulting Physician (Oncology) Dora Sims, DDS as Referring Physician (Oral Surgery) Debroah Baller, MD as Consulting Physician (Urology)  Patient Active Problem List   Diagnosis Date Noted   Vitamin D toxicity 07/24/2022   Anemia due to stage 4 chronic kidney disease (HCC) 03/20/2022   Hypercalcemia of malignancy 02/16/2021   Chronic systolic heart failure (HCC)    Abnormal stress test 12/05/2020   Depressed left ventricular ejection fraction 12/05/2020   Nonrheumatic mitral valve regurgitation 12/05/2020   Pre-diabetes 12/05/2020   Pulmonary hypertension (HCC) 12/05/2020   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   Severe  pulmonary hypertension (HCC) 12/05/2020   Tobacco use 12/05/2020   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   HFrEF (heart failure with reduced ejection fraction) (HCC) 11/23/2020   CAD (coronary artery disease) 11/23/2020   GERD (gastroesophageal reflux disease)    Hyperlipidemia    Hypertension    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   Malignant neoplasm of prostate (HCC) 06/02/2020   AKI (acute kidney injury) (HCC) 11/25/2019   Hyperphosphatemia 11/25/2019   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   Hyperkalemia 08/24/2019   Metabolic bone disease 08/24/2019   Vitamin D deficiency 08/24/2019   Benign hypertension with chronic kidney disease, stage III (HCC) 05/20/2019   Decreased cardiac ejection fraction 05/20/2019   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) 05/20/2019   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 02/24/2019   Olecranon bursitis of left elbow 06/25/2018   B12 deficiency 03/22/2018   Gastroesophageal reflux disease without esophagitis 03/22/2018   Chronic renal insufficiency, stage III (moderate) (HCC) 11/27/2015   Aortic valve disorder 07/08/2014   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   Cataract 11/2013   Essential hypertension 07/06/2012   Coronary arteriosclerosis 07/06/2012   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   Left bundle branch block 07/06/2012   Mixed hyperlipidemia 07/06/2012   Cancer (HCC) 08/2008   S/P radiation therapy > 12 wks ago 2010  Current Outpatient Medications:    aspirin EC 81 MG tablet, Take 1 tablet (81 mg total) by mouth daily., Disp: 90 tablet, Rfl: 3   Cetirizine HCl 10 MG CAPS, Take 1 capsule by mouth daily., Disp: , Rfl:    dapagliflozin propanediol (FARXIGA) 10 MG TABS tablet, Take 1 tablet (10 mg total) by mouth daily before breakfast., Disp: 90 tablet, Rfl: 3   digoxin  (LANOXIN) 0.125 MG tablet, Take 1 tablet (0.125 mg total) by mouth every other day., Disp: 45 tablet, Rfl: 3   enzalutamide (XTANDI) 40 MG tablet, Take 160 mg by mouth daily., Disp: , Rfl:    Evolocumab (REPATHA SURECLICK) 140 MG/ML SOAJ, INJECT 1 DOSE UNDER THE SKIN EVERY 14 DAYS, Disp: 6 mL, Rfl: 3   FISH OIL-KRILL OIL PO, Take 500 mg by mouth daily., Disp: , Rfl:    metoprolol succinate (TOPROL XL) 25 MG 24 hr tablet, Take 1 tablet (25 mg total) by mouth at bedtime., Disp: 90 tablet, Rfl: 3   Multiple Vitamins-Minerals (PRESERVISION/LUTEIN) CAPS, Take 1 capsule by mouth at bedtime., Disp: , Rfl:    omeprazole (PRILOSEC) 40 MG capsule, Take 40 mg by mouth daily., Disp: , Rfl:    potassium chloride SA (KLOR-CON M) 20 MEQ tablet, Take 1 tablet (20 mEq total) by mouth daily., Disp: 30 tablet, Rfl: 3   pregabalin (LYRICA) 100 MG capsule, Take 100 mg by mouth 3 (three) times daily., Disp: , Rfl:    rosuvastatin (CRESTOR) 5 MG tablet, Take 1 tablet (5 mg total) by mouth daily., Disp: 90 tablet, Rfl: 3   spironolactone (ALDACTONE) 25 MG tablet, Take 1 tablet (25 mg total) by mouth daily., Disp: 90 tablet, Rfl: 1   tamsulosin (FLOMAX) 0.4 MG CAPS capsule, Take 1 capsule (0.4 mg total) by mouth daily., Disp: 90 capsule, Rfl: 0   torsemide (DEMADEX) 20 MG tablet, Take 4 tablets (80 mg total) by mouth in the morning AND 2 tablets (40 mg total) every evening., Disp: 180 tablet, Rfl: 6   traZODone (DESYREL) 100 MG tablet, Take 50 mg by mouth at bedtime., Disp: , Rfl:    amoxicillin (AMOXIL) 500 MG capsule, Take 500 mg by mouth 3 (three) times daily. To be completed by 04/25/23 at noon - RX by Monia Pouch, DDS, Disp: , Rfl:    cholestyramine light (PREVALITE) 4 g packet, Take 4 g by mouth 2 (two) times daily as needed. (Patient not taking: Reported on 06/18/2023), Disp: , Rfl:    metolazone (ZAROXOLYN) 2.5 MG tablet, Take 1 tablet (2.5 mg total) by mouth as directed. Only take as directed by the advanced heart failure  clinic (Patient not taking: Reported on 06/11/2023), Disp: 10 tablet, Rfl: 0 Allergies  Allergen Reactions   Ezetimibe Other (See Comments)    Myalgia   Gabapentin Itching and Other (See Comments)    Sore throat, breakouts   Statins Other (See Comments)    Myalgias (intolerance)     Social History   Socioeconomic History   Marital status: Married    Spouse name: Not on file   Number of children: Not on file   Years of education: Not on file   Highest education level: Not on file  Occupational History   Not on file  Tobacco Use   Smoking status: Every Day    Current packs/day: 0.50    Average packs/day: 0.5 packs/day for 50.0 years (25.0 ttl pk-yrs)    Types: Cigarettes   Smokeless tobacco: Never  Vaping Use  Vaping status: Never Used  Substance and Sexual Activity   Alcohol use: Not Currently   Drug use: Never   Sexual activity: Not on file  Other Topics Concern   Not on file  Social History Narrative   Not on file   Social Determinants of Health   Financial Resource Strain: Low Risk  (05/22/2022)   Overall Financial Resource Strain (CARDIA)    Difficulty of Paying Living Expenses: Not very hard  Food Insecurity: No Food Insecurity (05/22/2022)   Hunger Vital Sign    Worried About Running Out of Food in the Last Year: Never true    Ran Out of Food in the Last Year: Never true  Transportation Needs: No Transportation Needs (05/22/2022)   PRAPARE - Administrator, Civil Service (Medical): No    Lack of Transportation (Non-Medical): No  Physical Activity: Not on file  Stress: Not on file  Social Connections: Not on file  Intimate Partner Violence: Not on file    Physical Exam      Future Appointments  Date Time Provider Department Center  06/23/2023  3:40 PM [facility]  08/04/2023  2:00 PM Laurey Morale, MD [facility] None  09/22/2023  3:40 PM [facility]  10/13/2023  10:45 AM [facility] None  10/14/2023 10:30 AM Weston Settle, MD [facility] None  12/22/2023  3:40 PM [facility]  03/22/2024  3:40 PM [facility]  06/21/2024  3:40 PM [facility]  09/20/2024  3:40 PM [facility]  12/20/2024  3:40 PM [facility]  03/21/2025  3:40 PM [facility]  06/20/2025  3:40 PM [facility]  09/19/2025  3:40 PM [facility]

## 2023-06-19 LAB — BASIC METABOLIC PANEL
BUN/Creatinine Ratio: 19 (ref 10–24)
BUN: 40 mg/dL — ABNORMAL HIGH (ref 8–27)
CO2: 24 mmol/L (ref 20–29)
Calcium: 9.7 mg/dL (ref 8.6–10.2)
Chloride: 98 mmol/L (ref 96–106)
Creatinine, Ser: 2.11 mg/dL — ABNORMAL HIGH (ref 0.76–1.27)
Glucose: 120 mg/dL — ABNORMAL HIGH (ref 70–99)
Potassium: 4.5 mmol/L (ref 3.5–5.2)
Sodium: 140 mmol/L (ref 134–144)
eGFR: 31 mL/min/{1.73_m2} — ABNORMAL LOW (ref >59)

## 2023-06-19 LAB — DIGOXIN LEVEL: Digoxin, Serum: 2.1 ng/mL (ref 0.5–0.9)

## 2023-06-20 ENCOUNTER — Telehealth (HOSPITAL_COMMUNITY): Payer: Self-pay

## 2023-06-20 NOTE — Telephone Encounter (Signed)
-----   Message from Jacklynn Ganong sent at 06/20/2023  8:02 AM EDT ----- Dig level too high. This was supposed to be a trough. Please call asap and ask patient if he held his digoxin before this was drawn.

## 2023-06-20 NOTE — Telephone Encounter (Signed)
Spoke to patients wife (okay per DPR) advised her of the following results. She is unsure if patient took digoxin before labs or not. Advised her that per Shanda Bumps, NP patient should stop Digoxin. Patients wife aware and verbalized understanding. Will also make paramedicine aware to remove digoxin from patients medication regimen.   Patients wife also unsure if patient is symptomatic- states patient is very evasive with what he says. Advised her of symptoms to watch for and ER precautions. Patients wife verbalized understanding.

## 2023-06-23 ENCOUNTER — Ambulatory Visit (INDEPENDENT_AMBULATORY_CARE_PROVIDER_SITE_OTHER): Payer: Medicare Other

## 2023-06-23 DIAGNOSIS — I5022 Chronic systolic (congestive) heart failure: Secondary | ICD-10-CM | POA: Diagnosis not present

## 2023-06-23 DIAGNOSIS — I420 Dilated cardiomyopathy: Secondary | ICD-10-CM

## 2023-06-23 LAB — CUP PACEART REMOTE DEVICE CHECK
Battery Remaining Longevity: 58 mo
Battery Remaining Percentage: 69 %
Battery Voltage: 2.96 V
Brady Statistic AP VP Percent: 81 %
Brady Statistic AP VS Percent: 1.3 %
Brady Statistic AS VP Percent: 13 %
Brady Statistic AS VS Percent: 1 %
Brady Statistic RA Percent Paced: 79 %
Date Time Interrogation Session: 20241104010135
HighPow Impedance: 78 Ohm
Implantable Lead Connection Status: 753985
Implantable Lead Connection Status: 753985
Implantable Lead Connection Status: 753985
Implantable Lead Implant Date: 20220919
Implantable Lead Implant Date: 20220919
Implantable Lead Implant Date: 20220919
Implantable Lead Location: 753858
Implantable Lead Location: 753859
Implantable Lead Location: 753860
Implantable Pulse Generator Implant Date: 20220919
Lead Channel Impedance Value: 460 Ohm
Lead Channel Impedance Value: 490 Ohm
Lead Channel Impedance Value: 560 Ohm
Lead Channel Pacing Threshold Amplitude: 0.5 V
Lead Channel Pacing Threshold Amplitude: 0.75 V
Lead Channel Pacing Threshold Amplitude: 1 V
Lead Channel Pacing Threshold Pulse Width: 0.5 ms
Lead Channel Pacing Threshold Pulse Width: 0.5 ms
Lead Channel Pacing Threshold Pulse Width: 0.7 ms
Lead Channel Sensing Intrinsic Amplitude: 12 mV
Lead Channel Sensing Intrinsic Amplitude: 3.5 mV
Lead Channel Setting Pacing Amplitude: 1.5 V
Lead Channel Setting Pacing Amplitude: 2 V
Lead Channel Setting Pacing Amplitude: 2.5 V
Lead Channel Setting Pacing Pulse Width: 0.5 ms
Lead Channel Setting Pacing Pulse Width: 0.7 ms
Lead Channel Setting Sensing Sensitivity: 0.5 mV
Pulse Gen Serial Number: 111046561
Zone Setting Status: 755011

## 2023-06-25 ENCOUNTER — Other Ambulatory Visit (HOSPITAL_COMMUNITY): Payer: Self-pay | Admitting: Emergency Medicine

## 2023-06-25 NOTE — Progress Notes (Signed)
Paramedicine Encounter    Patient ID: Tommy Ward, male    DOB: 1940-11-20, 82 y.o.   MRN: [id number]   Complaints - none   Assessment - lung sounds clear, +1 edema in left leg, no edema in right   Compliance with meds - no missed medication noted  Pill box filled - for 2x weeks  Refills needed - trazadone, tamsulosin, potassium  Meds changes since last visit - discontinued Digoxin  11/4   Social changes - none   VISIT SUMMARY**  Met with Tommy Ward in the home. Pt reports that he has no complaints and has remained about the same as last week. No increased swelling noted, no exertional shortness of breath. Pt denied any chest pain, palpitations, dizziness, or shortness of breath. Pt was taken off of his Digoxin, based on labs, on Monday 11/4. Pill removed by wife and change continued in pill box. Pill box was filled for 2x week. Upcoming appointments were reviewed. Will follow up in 2x weeks.   BP 118/70   Pulse 70   Resp 18   Wt 174 lb 3.2 oz (79 kg)   SpO2 96%   BMI 25.72 kg/m  Weight yesterday-172 lbs Last visit weight-173 lbs   [facility] EMT-P [facility] Par[facility]  [phone]     ACTION: Home visit completed     Patient Care Team: Buckner Malta, MD as PCP - General (Family Medicine) Rennis Golden Lisette Abu, MD as PCP - Cardiology (Cardiology) Lanier Prude, MD as PCP - Electrophysiology (Cardiology) Weston Settle, MD as Consulting Physician (Oncology) Dora Sims, DDS as Referring Physician (Oral Surgery) Debroah Baller, MD as Consulting Physician (Urology)  Patient Active Problem List   Diagnosis Date Noted   Vitamin D toxicity 07/24/2022   Anemia due to stage 4 chronic kidney disease (HCC) 03/20/2022   Hypercalcemia of malignancy 02/16/2021   Chronic systolic heart failure (HCC)    Abnormal stress test 12/05/2020   Depressed left ventricular ejection fraction 12/05/2020   Nonrheumatic mitral valve regurgitation 12/05/2020    Pre-diabetes 12/05/2020   Pulmonary hypertension (HCC) 12/05/2020   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HCC) 12/05/2020   Tobacco use 12/05/2020   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   HFrEF (heart failure with reduced ejection fraction) (HCC) 11/23/2020   CAD (coronary artery disease) 11/23/2020   GERD (gastroesophageal reflux disease)    Hyperlipidemia    Hypertension    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   Malignant neoplasm of prostate (HCC) 06/02/2020   AKI (acute kidney injury) (HCC) 11/25/2019   Hyperphosphatemia 11/25/2019   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   Hyperkalemia 08/24/2019   Metabolic bone disease 08/24/2019   Vitamin D deficiency 08/24/2019   Benign hypertension with chronic kidney disease, stage III (HCC) 05/20/2019   Decreased cardiac ejection fraction 05/20/2019   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) 05/20/2019   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 02/24/2019   Olecranon bursitis of left elbow 06/25/2018   B12 deficiency 03/22/2018   Gastroesophageal reflux disease without esophagitis 03/22/2018   Chronic renal insufficiency, stage III (moderate) (HCC) 11/27/2015   Aortic valve disorder 07/08/2014   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   Cataract 11/2013   Essential hypertension 07/06/2012   Coronary arteriosclerosis 07/06/2012   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   Left bundle branch block 07/06/2012   Mixed hyperlipidemia  07/06/2012   Cancer (HCC) 08/2008   S/P radiation therapy > 12 wks ago 2010    Current Outpatient Medications:    aspirin EC 81 MG tablet, Take 1 tablet (81 mg total) by mouth daily., Disp: 90 tablet, Rfl: 3   Cetirizine HCl 10 MG CAPS, Take 1 capsule by mouth daily., Disp: , Rfl:    cholestyramine light  (PREVALITE) 4 g packet, Take 4 g by mouth 2 (two) times daily as needed., Disp: , Rfl:    dapagliflozin propanediol (FARXIGA) 10 MG TABS tablet, Take 1 tablet (10 mg total) by mouth daily before breakfast., Disp: 90 tablet, Rfl: 3   enzalutamide (XTANDI) 40 MG tablet, Take 160 mg by mouth daily., Disp: , Rfl:    Evolocumab (REPATHA SURECLICK) 140 MG/ML SOAJ, INJECT 1 DOSE UNDER THE SKIN EVERY 14 DAYS, Disp: 6 mL, Rfl: 3   FISH OIL-KRILL OIL PO, Take 500 mg by mouth daily., Disp: , Rfl:    metoprolol succinate (TOPROL XL) 25 MG 24 hr tablet, Take 1 tablet (25 mg total) by mouth at bedtime., Disp: 90 tablet, Rfl: 3   Multiple Vitamins-Minerals (PRESERVISION/LUTEIN) CAPS, Take 1 capsule by mouth at bedtime., Disp: , Rfl:    omeprazole (PRILOSEC) 40 MG capsule, Take 40 mg by mouth daily., Disp: , Rfl:    potassium chloride SA (KLOR-CON M) 20 MEQ tablet, Take 1 tablet (20 mEq total) by mouth daily., Disp: 30 tablet, Rfl: 3   pregabalin (LYRICA) 100 MG capsule, Take 100 mg by mouth 3 (three) times daily., Disp: , Rfl:    rosuvastatin (CRESTOR) 5 MG tablet, Take 1 tablet (5 mg total) by mouth daily., Disp: 90 tablet, Rfl: 3   spironolactone (ALDACTONE) 25 MG tablet, Take 1 tablet (25 mg total) by mouth daily., Disp: 90 tablet, Rfl: 1   tamsulosin (FLOMAX) 0.4 MG CAPS capsule, Take 1 capsule (0.4 mg total) by mouth daily., Disp: 90 capsule, Rfl: 0   torsemide (DEMADEX) 20 MG tablet, Take 4 tablets (80 mg total) by mouth in the morning AND 2 tablets (40 mg total) every evening., Disp: 180 tablet, Rfl: 6   traZODone (DESYREL) 100 MG tablet, Take 50 mg by mouth at bedtime., Disp: , Rfl:    amoxicillin (AMOXIL) 500 MG capsule, Take 500 mg by mouth 3 (three) times daily. To be completed by 04/25/23 at noon - RX by Monia Pouch, DDS, Disp: , Rfl:    metolazone (ZAROXOLYN) 2.5 MG tablet, Take 1 tablet (2.5 mg total) by mouth as directed. Only take as directed by the advanced heart failure clinic (Patient not taking:  Reported on 06/11/2023), Disp: 10 tablet, Rfl: 0 Allergies  Allergen Reactions   Ezetimibe Other (See Comments)    Myalgia   Gabapentin Itching and Other (See Comments)    Sore throat, breakouts   Statins Other (See Comments)    Myalgias (intolerance)     Social History   Socioeconomic History   Marital status: Married    Spouse name: Not on file   Number of children: Not on file   Years of education: Not on file   Highest education level: Not on file  Occupational History   Not on file  Tobacco Use   Smoking status: Every Day    Current packs/day: 0.50    Average packs/day: 0.5 packs/day for 50.0 years (25.0 ttl pk-yrs)    Types: Cigarettes   Smokeless tobacco: Never  Vaping Use   Vaping status: Never Used  Substance and Sexual Activity  Alcohol use: Not Currently   Drug use: Never   Sexual activity: Not on file  Other Topics Concern   Not on file  Social History Narrative   Not on file   Social Determinants of Health   Financial Resource Strain: Low Risk  (05/22/2022)   Overall Financial Resource Strain (CARDIA)    Difficulty of Paying Living Expenses: Not very hard  Food Insecurity: No Food Insecurity (05/22/2022)   Hunger Vital Sign    Worried About Running Out of Food in the Last Year: Never true    Ran Out of Food in the Last Year: Never true  Transportation Needs: No Transportation Needs (05/22/2022)   PRAPARE - Administrator, Civil Service (Medical): No    Lack of Transportation (Non-Medical): No  Physical Activity: Not on file  Stress: Not on file  Social Connections: Not on file  Intimate Partner Violence: Not on file    Physical Exam      Future Appointments  Date Time Provider Department Center  07/07/2023 11:15 AM Pilar Plate, DPM [facility]  08/04/2023  2:00 PM Laurey Morale, MD [facility] None  09/22/2023  3:40 PM [facility]  10/13/2023 10:45 AM [facility] None  10/14/2023 10:30 AM Weston Settle, MD [facility] None  12/22/2023  3:40 PM [facility]  03/22/2024  3:40 PM [facility]  06/21/2024  3:40 PM [facility] REMOT[facility]  09/20/2024  3:40 PM [facility]  12/20/2024  3:40 PM [facility]  03/21/2025  3:40 PM [facility]  06/20/2025  3:40 PM [facility]  09/19/2025  3:40 PM [facility]

## 2023-06-30 ENCOUNTER — Other Ambulatory Visit (HOSPITAL_COMMUNITY): Payer: Self-pay

## 2023-07-02 ENCOUNTER — Other Ambulatory Visit (HOSPITAL_COMMUNITY): Payer: Self-pay | Admitting: Cardiology

## 2023-07-02 ENCOUNTER — Other Ambulatory Visit (HOSPITAL_COMMUNITY): Payer: Self-pay

## 2023-07-02 ENCOUNTER — Telehealth (HOSPITAL_COMMUNITY): Payer: Self-pay | Admitting: Emergency Medicine

## 2023-07-02 ENCOUNTER — Other Ambulatory Visit (HOSPITAL_COMMUNITY): Payer: Self-pay | Admitting: Emergency Medicine

## 2023-07-02 ENCOUNTER — Other Ambulatory Visit: Payer: Self-pay

## 2023-07-02 DIAGNOSIS — I5022 Chronic systolic (congestive) heart failure: Secondary | ICD-10-CM

## 2023-07-02 MED ORDER — DIGOXIN 125 MCG PO TABS
0.0625 mg | ORAL_TABLET | Freq: Every day | ORAL | 5 refills | Status: DC
  Filled 2023-07-02 (×2): qty 15, 30d supply, fill #0

## 2023-07-02 MED ORDER — TRAZODONE HCL 100 MG PO TABS
150.0000 mg | ORAL_TABLET | Freq: Every day | ORAL | 1 refills | Status: DC
  Filled 2023-07-02: qty 30, 30d supply, fill #0

## 2023-07-02 MED ORDER — TAMSULOSIN HCL 0.4 MG PO CAPS
0.4000 mg | ORAL_CAPSULE | Freq: Every day | ORAL | 0 refills | Status: DC
  Filled 2023-07-02: qty 90, 90d supply, fill #0

## 2023-07-02 MED ORDER — DIGOXIN 125 MCG PO TABS
0.0625 mg | ORAL_TABLET | Freq: Every day | ORAL | 5 refills | Status: DC
  Filled 2023-07-02: qty 15, 30d supply, fill #0

## 2023-07-02 NOTE — Addendum Note (Signed)
Addended by: Wyline Mood, Tyrome Donatelli R on: 07/02/2023 04:00 PM   Modules accepted: Orders

## 2023-07-02 NOTE — Addendum Note (Signed)
Addended by: Rob Bunting R on: 07/02/2023 03:59 PM   Modules accepted: Orders

## 2023-07-02 NOTE — Telephone Encounter (Signed)
He can start back on lower digoxin dose, 0.0625 mg daily.  Needs digoxin level drawn as a trough (before am digoxin) in 1 week.  Needs appointment with APP in the next week.

## 2023-07-02 NOTE — Progress Notes (Signed)
Paramedicine Encounter    Patient ID: Tommy Ward, male    DOB: [birth year], 82 y.o.   MRN: [id number]   Complaints - air bubbling sensation in throat and increased exertional shortness of breath.    Assessment - lung sounds clear, +1 edema bilaterally.   Compliance with meds - no missed doses  Pill box filled - was filled for 2x weeks on 11/6   Refills needed - tamsolosin, trazadone  Meds changes since last visit - Digoxin stopped 11/4     Social changes - none    VISIT SUMMARY**  Met with Tommy Ward in the home. Pt's wife called today concerning some symptoms ongoing throughout the weekend and into the week. Tommy Ward reported a sensation in his throat that felt like airbubbles/gas. Pt also reported increased shortness of breath upon exertion and increased fatigue. I contacted triage and Dr. Shirlee Latch responded with a medication change. Pt to start 1/2 pill of digoxin starting tomorrow and labs in 1x week. Confirmed order sent to appropriate facility. Pt and wife informed of med change, labs necessary, and new appointment and advised that they could comply. Will follow up in the home next week.    BP (!) 122/90   Pulse 62   Resp 18   Wt 175 lb 3.2 oz (79.5 kg)   SpO2 92%   BMI 25.87 kg/m  Weight yesterday-DNW Last visit weight-174lbs   [place] [facility] EMT-P Community Paramedic  [phone]     ACTION: Home visit completed     Patient Care Team: Buckner Malta, MD as PCP - General (Family Medicine) Rennis Golden Lisette Abu, MD as PCP - Cardiology (Cardiology) Lanier Prude, MD as PCP - Electrophysiology (Cardiology) Weston Settle, MD as Consulting Physician (Oncology) Dora Sims, DDS as Referring Physician (Oral Surgery) Debroah Baller, MD as Consulting Physician (Urology)  Patient Active Problem List   Diagnosis Date Noted   Vitamin D toxicity 07/24/2022   Anemia due to stage 4 chronic kidney disease (HCC) 03/20/2022   Hypercalcemia of malignancy 02/16/2021    Chronic systolic heart failure (HCC)    Abnormal stress test 12/05/2020   Depressed left ventricular ejection fraction 12/05/2020   Nonrheumatic mitral valve regurgitation 12/05/2020   Pre-diabetes 12/05/2020   Pulmonary hypertension (HCC) 12/05/2020   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HCC) 12/05/2020   Tobacco use 12/05/2020   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   HFrEF (heart failure with reduced ejection fraction) (HCC) 11/23/2020   CAD (coronary artery disease) 11/23/2020   GERD (gastroesophageal reflux disease)    Hyperlipidemia    Hypertension    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   Malignant neoplasm of prostate (HCC) 06/02/2020   AKI (acute kidney injury) (HCC) 11/25/2019   Hyperphosphatemia 11/25/2019   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   Hyperkalemia 08/24/2019   Metabolic bone disease 08/24/2019   Vitamin D deficiency 08/24/2019   Benign hypertension with chronic kidney disease, stage III (HCC) 05/20/2019   Decreased cardiac ejection fraction 05/20/2019   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) 05/20/2019   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 02/24/2019   Olecranon bursitis of left elbow 06/25/2018   B12 deficiency 03/22/2018   Gastroesophageal reflux disease without esophagitis 03/22/2018   Chronic renal insufficiency, stage III (moderate) (HCC) 11/27/2015   Aortic valve disorder 07/08/2014   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   Cataract 11/2013  Essential hypertension 07/06/2012   Coronary arteriosclerosis 07/06/2012   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   Left bundle branch block 07/06/2012   Mixed hyperlipidemia 07/06/2012   Cancer (HCC) 08/2008   S/P radiation therapy > 12 wks ago 2010    Current Outpatient Medications:     amoxicillin (AMOXIL) 500 MG capsule, Take 500 mg by mouth 3 (three) times daily. To be completed by 04/25/23 at noon - RX by Monia Pouch, DDS, Disp: , Rfl:    aspirin EC 81 MG tablet, Take 1 tablet (81 mg total) by mouth daily., Disp: 90 tablet, Rfl: 3   Cetirizine HCl 10 MG CAPS, Take 1 capsule by mouth daily., Disp: , Rfl:    cholestyramine light (PREVALITE) 4 g packet, Take 4 g by mouth 2 (two) times daily as needed., Disp: , Rfl:    dapagliflozin propanediol (FARXIGA) 10 MG TABS tablet, Take 1 tablet (10 mg total) by mouth daily before breakfast., Disp: 90 tablet, Rfl: 3   digoxin (LANOXIN) 0.125 MG tablet, Take 0.5 tablets (0.0625 mg total) by mouth daily., Disp: 15 tablet, Rfl: 5   enzalutamide (XTANDI) 40 MG tablet, Take 160 mg by mouth daily., Disp: , Rfl:    Evolocumab (REPATHA SURECLICK) 140 MG/ML SOAJ, INJECT 1 DOSE UNDER THE SKIN EVERY 14 DAYS, Disp: 6 mL, Rfl: 3   FISH OIL-KRILL OIL PO, Take 500 mg by mouth daily., Disp: , Rfl:    metolazone (ZAROXOLYN) 2.5 MG tablet, Take 1 tablet (2.5 mg total) by mouth as directed. Only take as directed by the advanced heart failure clinic (Patient not taking: Reported on 06/11/2023), Disp: 10 tablet, Rfl: 0   metoprolol succinate (TOPROL XL) 25 MG 24 hr tablet, Take 1 tablet (25 mg total) by mouth at bedtime., Disp: 90 tablet, Rfl: 3   Multiple Vitamins-Minerals (PRESERVISION/LUTEIN) CAPS, Take 1 capsule by mouth at bedtime., Disp: , Rfl:    omeprazole (PRILOSEC) 40 MG capsule, Take 40 mg by mouth daily., Disp: , Rfl:    potassium chloride SA (KLOR-CON M) 20 MEQ tablet, Take 1 tablet (20 mEq total) by mouth daily., Disp: 30 tablet, Rfl: 3   pregabalin (LYRICA) 100 MG capsule, Take 100 mg by mouth 3 (three) times daily., Disp: , Rfl:    rosuvastatin (CRESTOR) 5 MG tablet, Take 1 tablet (5 mg total) by mouth daily., Disp: 90 tablet, Rfl: 3   spironolactone (ALDACTONE) 25 MG tablet, Take 1 tablet (25 mg total) by mouth daily., Disp: 90 tablet, Rfl: 1    tamsulosin (FLOMAX) 0.4 MG CAPS capsule, Take 1 capsule (0.4 mg total) by mouth daily., Disp: 90 capsule, Rfl: 0   torsemide (DEMADEX) 20 MG tablet, Take 4 tablets (80 mg total) by mouth in the morning AND 2 tablets (40 mg total) every evening., Disp: 180 tablet, Rfl: 6   traZODone (DESYREL) 100 MG tablet, Take 50 mg by mouth at bedtime., Disp: , Rfl:    traZODone (DESYREL) 100 MG tablet, Take 1.5 tablets (150 mg total) by mouth at bedtime., Disp: 30 tablet, Rfl: 1 Allergies  Allergen Reactions   Ezetimibe Other (See Comments)    Myalgia   Gabapentin Itching and Other (See Comments)    Sore throat, breakouts   Statins Other (See Comments)    Myalgias (intolerance)     Social History   Socioeconomic History   Marital status: Married    Spouse name: Not on file   Number of children: Not on file   Years of education:  Not on file   Highest education level: Not on file  Occupational History   Not on file  Tobacco Use   Smoking status: Every Day    Current packs/day: 0.50    Average packs/day: 0.5 packs/day for 50.0 years (25.0 ttl pk-yrs)    Types: Cigarettes   Smokeless tobacco: Never  Vaping Use   Vaping status: Never Used  Substance and Sexual Activity   Alcohol use: Not Currently   Drug use: Never   Sexual activity: Not on file  Other Topics Concern   Not on file  Social History Narrative   Not on file   Social Determinants of Health   Financial Resource Strain: Low Risk  (05/22/2022)   Overall Financial Resource Strain (CARDIA)    Difficulty of Paying Living Expenses: Not very hard  Food Insecurity: No Food Insecurity (05/22/2022)   Hunger Vital Sign    Worried About Running Out of Food in the Last Year: Never true    Ran Out of Food in the Last Year: Never true  Transportation Needs: No Transportation Needs (05/22/2022)   PRAPARE - Administrator, Civil Service (Medical): No    Lack of Transportation (Non-Medical): No  Physical Activity: Not on file   Stress: Not on file  Social Connections: Not on file  Intimate Partner Violence: Not on file    Physical Exam      Future Appointments  Date Time Provider Department Center  07/07/2023 11:15 AM Pilar Plate, DPM [facility]  07/14/2023  1:30 PM [facility] PA/NP [facility] None  08/04/2023  2:00 PM Laurey Morale, MD [facility] None  09/22/2023  3:40 PM [facility] DEVICE REMOTES [facility]  10/13/2023 10:45 AM [facility] None  10/14/2023 10:30 AM Weston Settle, MD [facility] None  12/22/2023  3:40 PM [facility] DEVICE REMOTES [facility]  03/22/2024  3:40 PM [facility] DEVICE REMOTES [facility]  06/21/2024  3:40 PM [facility] DEVICE REMOTES [facility]  09/20/2024  3:40 PM [facility] DEVICE REMOTES [facility]  12/20/2024  3:40 PM [facility] DEVICE REMOTES [facility]  03/21/2025  3:40 PM [facility] DEVICE REMOTES [facility]  06/20/2025  3:40 PM [facility] DEVICE REMOTES [facility]  09/19/2025  3:40 PM [facility] DEVICE REMOTES [facility]

## 2023-07-02 NOTE — Telephone Encounter (Signed)
I received a phone call from [facility] this morning. She requested me to come out and evaluate Tommy Ward this morning. She reports that he has been more shortness of breath.     Upon assessment, pt presented with shortness of breath from walking a short distance to the scale which is abnormal for him. Pt reports that he has had to stop his activities and rest for the rest of the day due to his fatigue from shortness of breath.   Vitals:  BP - 120/90 HR- 72 SPO2 92%  Weight- 175.2 lbs Edema - +1 edema bilaterally   Pt reports that he has been having this sensation "Up in his throat" that feels like, "a choking sensation of gas pain/ an air bubble". Alleviates sometimes with breathing exercises, but happens approximately 30x per day for the last 4 days, mostly upon exertion.   Pt denied any diet changes or routine changes. Denied any increased stressors.   Symptoms reported to have increased relatively close to the stop of digoxin.  Wife reports that she is concerned that the lab work was taken on a different day than originally scheduled. Pt did not remove any digoxin prior to his lab work on the 31st. Wife is concerned that his labs may be skewed due to this schedule change.   Paced rhythm with PVCs noted. Denies chest pain, palpitations, dizziness, just increased exertional shortness of breath.   Compliant with meds, continues to take omeprazole, no missed doses.   Do we need to repeat labs? Any other recommendations for his symptoms?  [place] EMT-P Community Paramedic  [phone]

## 2023-07-07 ENCOUNTER — Ambulatory Visit (INDEPENDENT_AMBULATORY_CARE_PROVIDER_SITE_OTHER): Payer: Medicare Other | Admitting: Podiatry

## 2023-07-07 ENCOUNTER — Encounter: Payer: Self-pay | Admitting: Podiatry

## 2023-07-07 DIAGNOSIS — B351 Tinea unguium: Secondary | ICD-10-CM | POA: Diagnosis not present

## 2023-07-07 DIAGNOSIS — M792 Neuralgia and neuritis, unspecified: Secondary | ICD-10-CM

## 2023-07-07 DIAGNOSIS — M79674 Pain in right toe(s): Secondary | ICD-10-CM | POA: Diagnosis not present

## 2023-07-07 DIAGNOSIS — M79675 Pain in left toe(s): Secondary | ICD-10-CM

## 2023-07-07 NOTE — Progress Notes (Signed)
  Subjective:  Patient ID: Tommy Ward, male    DOB: Dec 30, 1940,  MRN: [id number]  Tommy Ward presents to clinic today for at risk foot care with history of peripheral neuropathy and painful thick toenails that are difficult to trim. Pain interferes with ambulation. Aggravating factors include wearing enclosed shoe gear. Pain is relieved with periodic professional debridement.  Chief Complaint  Patient presents with   RFC    Nail Trim. Also complaining of neuropathic pain    PCP is Buckner Malta, MD.  Allergies  Allergen Reactions   Ezetimibe Other (See Comments)    Myalgia   Gabapentin Itching and Other (See Comments)    Sore throat, breakouts   Statins Other (See Comments)    Myalgias (intolerance)    Review of Systems: Negative except as noted in the HPI.  Objective:  There were no vitals filed for this visit.  Tommy Ward is a pleasant 82 y.o. male WD, WN in NAD. AAO x 3.  Vascular Examination: CFT <3 seconds b/l. DP/PT pulses faintly palpable b/l. Skin temperature gradient warm to warm b/l. No pain with calf compression. No ischemia or gangrene. No cyanosis or clubbing noted b/l. Pedal hair sparse. Varicosities present b/l.   Neurological Examination: Sensation grossly intact b/l with 10 gram monofilament. Vibratory sensation intact b/l. Pt has subjective symptoms of neuropathy.  Dermatological Examination: Pedal skin warm and supple b/l. Toenails 2-5 b/l thick, discolored, elongated with subungual debris and pain on dorsal palpation.    Incurvated nailplate left great toe.  Nail border hypertrophy minimal. There is tenderness to palpation. Sign(s) of infection: no clinical signs of infection noted on examination today..   Musculoskeletal Examination: Muscle strength 5/5 to b/l LE. Limited joint ROM to the 1st MPJ b/l right >left.  Radiographs: None  Assessment/Plan: 1. Pain due to onychomycosis of toenails of both feet   2. Neuropathic pain     # Neuropathic pain -Patient taking Lyrica for neuropathy pain says it is partially effective but still has some issues -Discussed we could try Qutenza treatment.  Will see if we get this approved through his insurance. -If approved we will call the patient and schedule Qutenza application -I certify that this diagnosis represents a distinct and separate diagnosis that requires evaluation and treatment separate from other procedures or diagnosis   -Patient was evaluated and treated. All patient's and/or POA's questions/concerns answered on today's visit. -Examined patient. -Cleansed exposed nailbed of right great toe with alchol. Applied triple antibiotic ointment. Patient instructed to apply triple antibiotic ointment to digit once daily for one week. -Continue supportive shoe gear daily. -Mycotic toenails 1-5 bilaterally were debrided in length and girth with sterile nail nippers and dremel without incident.  Return in about 3 months (around 10/07/2023) for RFC.  Pilar Plate, DPM

## 2023-07-09 ENCOUNTER — Other Ambulatory Visit (HOSPITAL_COMMUNITY): Payer: Self-pay | Admitting: Emergency Medicine

## 2023-07-09 NOTE — Progress Notes (Signed)
Paramedicine Encounter    Patient ID: Tommy Ward, male    DOB: 1941/04/08, 82 y.o.   MRN: [id number]   Complaints - continued chronic shortness of breath  Assessment - lung sounds clear, no pedal edema   Compliance with meds - 1 missed dose of torsemide.   Pill box filled - for 1x week   Refills needed - zyrtec   Meds changes since last visit -  restarted Digoxin on Thursday 11/14   Social changes - none   VISIT SUMMARY**  I met with Tommy Ward in his home today. Pt reported improvements since restarting the digoxin. Pt reports complete alleviation of "bubble" in his throat and reports more energy. Pt lost 1 lb in the last week and presents with no edema and lung sounds clear. Pt reports persistent shortness of breath, but wife reiterates that he has been able to do more this week than last. Pt's pill box was filled for 1x week. Upcoming appointments and labs were reviewed. Will follow up in clinic next week.   BP 102/64   Pulse 70   Wt 174 lb 12.8 oz (79.3 kg)   SpO2 98%   BMI 25.81 kg/m  Weight yesterday-174 lb Last visit weight-175 lbs    [place] EMT-P Community Paramedic  [phone]    ACTION: Home visit completed     Patient Care Team: Buckner Malta, MD as PCP - General (Family Medicine) Rennis Golden, Lisette Abu, MD as PCP - Cardiology (Cardiology) Lanier Prude, MD as PCP - Electrophysiology (Cardiology) Weston Settle, MD as Consulting Physician (Oncology) Dora Sims, DDS as Referring Physician (Oral Surgery) Debroah Baller, MD as Consulting Physician (Urology)  Patient Active Problem List   Diagnosis Date Noted   Vitamin D toxicity 07/24/2022   Anemia due to stage 4 chronic kidney disease (HCC) 03/20/2022   Hypercalcemia of malignancy 02/16/2021   Chronic systolic heart failure (HCC)    Abnormal stress test 12/05/2020   Depressed left ventricular ejection fraction 12/05/2020   Nonrheumatic mitral valve regurgitation 12/05/2020    Pre-diabetes 12/05/2020   Pulmonary hypertension (HCC) 12/05/2020   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HCC) 12/05/2020   Tobacco use 12/05/2020   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   HFrEF (heart failure with reduced ejection fraction) (HCC) 11/23/2020   CAD (coronary artery disease) 11/23/2020   GERD (gastroesophageal reflux disease)    Hyperlipidemia    Hypertension    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   Malignant neoplasm of prostate (HCC) 06/02/2020   AKI (acute kidney injury) (HCC) 11/25/2019   Hyperphosphatemia 11/25/2019   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   Hyperkalemia 08/24/2019   Metabolic bone disease 08/24/2019   Vitamin D deficiency 08/24/2019   Benign hypertension with chronic kidney disease, stage III (HCC) 05/20/2019   Decreased cardiac ejection fraction 05/20/2019   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) 05/20/2019   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 02/24/2019   Olecranon bursitis of left elbow 06/25/2018   B12 deficiency 03/22/2018   Gastroesophageal reflux disease without esophagitis 03/22/2018   Chronic renal insufficiency, stage III (moderate) (HCC) 11/27/2015   Aortic valve disorder 07/08/2014   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   Cataract 11/2013   Essential hypertension 07/06/2012   Coronary arteriosclerosis 07/06/2012   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   Left bundle branch block 07/06/2012  Mixed hyperlipidemia 07/06/2012   Cancer (HCC) 08/2008   S/P radiation therapy > 12 wks ago 2010    Current Outpatient Medications:    aspirin EC 81 MG tablet, Take 1 tablet (81 mg total) by mouth daily., Disp: 90 tablet, Rfl: 3   Cetirizine HCl 10 MG CAPS, Take 1 capsule by mouth daily., Disp: , Rfl:    cholestyramine  light (PREVALITE) 4 g packet, Take 4 g by mouth 2 (two) times daily as needed., Disp: , Rfl:    dapagliflozin propanediol (FARXIGA) 10 MG TABS tablet, Take 1 tablet (10 mg total) by mouth daily before breakfast., Disp: 90 tablet, Rfl: 3   digoxin (LANOXIN) 0.125 MG tablet, Take 0.5 tablets (0.0625 mg total) by mouth daily., Disp: 15 tablet, Rfl: 5   enzalutamide (XTANDI) 40 MG tablet, Take 160 mg by mouth daily., Disp: , Rfl:    Evolocumab (REPATHA SURECLICK) 140 MG/ML SOAJ, INJECT 1 DOSE UNDER THE SKIN EVERY 14 DAYS, Disp: 6 mL, Rfl: 3   FISH OIL-KRILL OIL PO, Take 500 mg by mouth daily., Disp: , Rfl:    metoprolol succinate (TOPROL XL) 25 MG 24 hr tablet, Take 1 tablet (25 mg total) by mouth at bedtime., Disp: 90 tablet, Rfl: 3   Multiple Vitamins-Minerals (PRESERVISION/LUTEIN) CAPS, Take 1 capsule by mouth at bedtime., Disp: , Rfl:    omeprazole (PRILOSEC) 40 MG capsule, Take 40 mg by mouth daily., Disp: , Rfl:    potassium chloride SA (KLOR-CON M) 20 MEQ tablet, Take 1 tablet (20 mEq total) by mouth daily., Disp: 30 tablet, Rfl: 3   pregabalin (LYRICA) 100 MG capsule, Take 100 mg by mouth 3 (three) times daily., Disp: , Rfl:    rosuvastatin (CRESTOR) 5 MG tablet, Take 1 tablet (5 mg total) by mouth daily., Disp: 90 tablet, Rfl: 3   spironolactone (ALDACTONE) 25 MG tablet, Take 1 tablet (25 mg total) by mouth daily., Disp: 90 tablet, Rfl: 1   tamsulosin (FLOMAX) 0.4 MG CAPS capsule, Take 1 capsule (0.4 mg total) by mouth daily., Disp: 90 capsule, Rfl: 0   torsemide (DEMADEX) 20 MG tablet, Take 4 tablets (80 mg total) by mouth in the morning AND 2 tablets (40 mg total) every evening., Disp: 180 tablet, Rfl: 6   traZODone (DESYREL) 100 MG tablet, Take 50 mg by mouth at bedtime as needed for sleep., Disp: , Rfl:    amoxicillin (AMOXIL) 500 MG capsule, Take 500 mg by mouth 3 (three) times daily. To be completed by 04/25/23 at noon - RX by Monia Pouch, DDS, Disp: , Rfl:    metolazone (ZAROXOLYN) 2.5 MG  tablet, Take 1 tablet (2.5 mg total) by mouth as directed. Only take as directed by the advanced heart failure clinic (Patient not taking: Reported on 07/09/2023), Disp: 10 tablet, Rfl: 0   traZODone (DESYREL) 100 MG tablet, Take 1.5 tablets (150 mg total) by mouth at bedtime., Disp: 30 tablet, Rfl: 1 Allergies  Allergen Reactions   Ezetimibe Other (See Comments)    Myalgia   Gabapentin Itching and Other (See Comments)    Sore throat, breakouts   Statins Other (See Comments)    Myalgias (intolerance)     Social History   Socioeconomic History   Marital status: Married    Spouse name: Not on file   Number of children: Not on file   Years of education: Not on file   Highest education level: Not on file  Occupational History   Not on file  Tobacco Use  Smoking status: Every Day    Current packs/day: 0.50    Average packs/day: 0.5 packs/day for 50.0 years (25.0 ttl pk-yrs)    Types: Cigarettes   Smokeless tobacco: Never  Vaping Use   Vaping status: Never Used  Substance and Sexual Activity   Alcohol use: Not Currently   Drug use: Never   Sexual activity: Not on file  Other Topics Concern   Not on file  Social History Narrative   Not on file   Social Determinants of Health   Financial Resource Strain: Low Risk  (05/22/2022)   Overall Financial Resource Strain (CARDIA)    Difficulty of Paying Living Expenses: Not very hard  Food Insecurity: No Food Insecurity (05/22/2022)   Hunger Vital Sign    Worried About Running Out of Food in the Last Year: Never true    Ran Out of Food in the Last Year: Never true  Transportation Needs: No Transportation Needs (05/22/2022)   PRAPARE - Administrator, Civil Service (Medical): No    Lack of Transportation (Non-Medical): No  Physical Activity: Not on file  Stress: Not on file  Social Connections: Not on file  Intimate Partner Violence: Not on file    Physical Exam      Future Appointments  Date Time Provider  Department Center  07/14/2023  1:30 PM [facility] PA/NP [facility] None  08/04/2023  2:00 PM Laurey Morale, MD [facility] None  08/07/2023  2:20 PM Revankar, Aundra Dubin, MD [facility] None  09/22/2023  3:40 PM [facility] DEVICE REMOTES [facility]  10/07/2023 11:15 AM Standiford, Jenelle Mages, DPM [facility]  10/13/2023 10:45 AM [facility] None  10/14/2023 10:30 AM Weston Settle, MD [facility] None  12/22/2023  3:40 PM [facility] DEVICE REMOTES [facility]  03/22/2024  3:40 PM [facility] DEVICE REMOTES [facility]  06/21/2024  3:40 PM [facility] DEVICE REMOTES [facility]  09/20/2024  3:40 PM [facility] DEVICE REMOTES [facility]  12/20/2024  3:40 PM [facility] DEVICE REMOTES [facility]  03/21/2025  3:40 PM [facility] DEVICE REMOTES [facility]  06/20/2025  3:40 PM [facility] DEVICE REMOTES [facility]  09/19/2025  3:40 PM [facility] DEVICE REMOTES [facility]

## 2023-07-11 LAB — DIGOXIN LEVEL: Digoxin, Serum: 0.5 ng/mL (ref 0.5–0.9)

## 2023-07-14 ENCOUNTER — Ambulatory Visit (HOSPITAL_COMMUNITY)
Admission: RE | Admit: 2023-07-14 | Discharge: 2023-07-14 | Disposition: A | Discharge status: Home / Self Care | Payer: Medicare Other | Source: Ambulatory Visit | Attending: Physician Assistant | Admitting: Physician Assistant

## 2023-07-14 ENCOUNTER — Other Ambulatory Visit (HOSPITAL_COMMUNITY): Payer: Self-pay | Admitting: Emergency Medicine

## 2023-07-14 ENCOUNTER — Encounter (HOSPITAL_COMMUNITY): Payer: Self-pay

## 2023-07-14 VITALS — BP 118/78 | HR 70 | Wt 177.2 lb

## 2023-07-14 DIAGNOSIS — I340 Nonrheumatic mitral (valve) insufficiency: Secondary | ICD-10-CM | POA: Insufficient documentation

## 2023-07-14 DIAGNOSIS — Z87891 Personal history of nicotine dependence: Secondary | ICD-10-CM | POA: Diagnosis not present

## 2023-07-14 DIAGNOSIS — I130 Hypertensive heart and chronic kidney disease with heart failure and stage 1 through stage 4 chronic kidney disease, or unspecified chronic kidney disease: Secondary | ICD-10-CM | POA: Diagnosis not present

## 2023-07-14 DIAGNOSIS — N1832 Chronic kidney disease, stage 3b: Secondary | ICD-10-CM | POA: Insufficient documentation

## 2023-07-14 DIAGNOSIS — Z7984 Long term (current) use of oral hypoglycemic drugs: Secondary | ICD-10-CM | POA: Insufficient documentation

## 2023-07-14 DIAGNOSIS — I2510 Atherosclerotic heart disease of native coronary artery without angina pectoris: Secondary | ICD-10-CM | POA: Diagnosis present

## 2023-07-14 DIAGNOSIS — R188 Other ascites: Secondary | ICD-10-CM | POA: Insufficient documentation

## 2023-07-14 DIAGNOSIS — I255 Ischemic cardiomyopathy: Secondary | ICD-10-CM | POA: Insufficient documentation

## 2023-07-14 DIAGNOSIS — Z79899 Other long term (current) drug therapy: Secondary | ICD-10-CM | POA: Insufficient documentation

## 2023-07-14 DIAGNOSIS — I5022 Chronic systolic (congestive) heart failure: Secondary | ICD-10-CM | POA: Diagnosis not present

## 2023-07-14 DIAGNOSIS — Z7982 Long term (current) use of aspirin: Secondary | ICD-10-CM | POA: Insufficient documentation

## 2023-07-14 NOTE — Progress Notes (Signed)
Paramedicine Encounter   Patient ID: Tommy Ward , male,   DOB: October 08, 1940,82 y.o.,  MRN: [id number]  Met with Mr. Kellison today in clinic for his appointment with Mardella Layman, [place]. Pt's wife reported some concerning rhythmic breathing. Lillia Abed questioned about sleep apnea and CPAP usage. Pt was resistant to same.   Lillia Abed recommended some aerobic exercise. Pt was resistant to walking due to neuropathy and artharitis, but advised he could do some stationary biking. Pt agreed to start with 15 min Daily. Will reevaluate compliance during home visit.   Lillia Abed advised no med changes at this time. Pt to follow up with Dr. Gala Romney next month with [facility].   Weight @ clinic - 177.2 lbs  Weight @ home - 174.8 lbs  B/P- 118/78 P- 69 SP02-98%  Med changes- - none   [place] Setting EMT-P Community Paramedic  [phone]

## 2023-07-14 NOTE — Progress Notes (Signed)
PCP: Buckner Malta, MD Cardiology: Dr. Tomie China HF Cardiology: Dr. Shirlee Latch  82 y.o. with history of prostate cancer, CKD stage 3, CAD, and ischemic cardiomyopathy Echo done in 4/22, showing EF < 20%, moderate-severe LV dilation, normal RV, severe MR.  LHC/RHC : low cardiac index at 1.81 and occluded mid LAD.  No intervention.  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   TEE 6/22, showing EF < 20% with septal-lateral dyssynchrony, mildly decreased RV systolic function, moderate TR, moderate central MR with ERO 0.2 cm^2.  Echo in 7/22 showed EF 20%, normal RV, moderate MR.  Patient had St Jude CRT-D device implanted.  Echo in 1/23 showed EF <20% with moderate LV dilation, mildly decreased RV systolic function, normal IVC.   Echo in 9/23 showed EF 20-25%, mild MR.   Had an episode 04/02/23 of not feeling well, having palpitations, increased dyspnea and LEE. ECG, via paramedic, showed frequent PVCs. Advised evaluation in ED, however patient declined.  Last seen for follow-up 05/28/23. Volume up w/ ReDS 43%. Tosremide increased to 80 q am and 40 q pm. Instructed to take 2.5 mg metolazone X 1.  He is followed by Paramedicine. Here today for clinic follow-up. Has been stable from HF standpoint. Home weight stable around 174-175 lb. He gets winded when pulling his garbage can to end of driveway then walking back up to house. No dyspnea with ordinary chores indoors. Does not get any regular exercise. No PND, occasional orthopnea. Tries to watch sodium and fluid intake.    Device interrogation (personally reviewed): No AT/AF or sustained VT, 4 second episode NSVT, 94% biVpaced, thoracic impedance above threshold  Labs (5/22): K 3.4, creatinine 1.7 => 2.15, AST 123 => 189, ALT 322 =>190, tbili 1.7, Hgb 12, LDL 38 Labs (6/22): Viral hepatitis serologies negative Labs (8/22): K 4.2, creatinine 1.87 Labs (10/22): K 3.7,  creatinine 2 Labs (11/22): LDL 159 Labs (12/22): K 3.7, creatinine 2 Labs (4/23): K 4.4, creatinine 2.7, LFTs normal Labs (7/23): K 4.4, creatinine 2.35 Labs (10/23): K 4.0, creatinine 2.4, LFTs normal Labs (2/24): K 4.9, creatinine 2.15, LDL 46, TGs 226 Labs (6/24): K 3.9, creatinine 2.19 Labs (9/24): K 4.3, creatinine 2.03 Labs 10/24): Scr 2.11, K 4.5 Labs (11/24): Digoxin level 0.5  PMH:  1. Hyperlipidemia 2. HTN 3. CKD stage 3 4. 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  Treated with radiation and currently on denosumab.   5. Cholecystectomy 6. CAD: LHC in 4/22 with 80% proximal RCA stenosis (nondominant), totally occluded mLAD with collaterals, 60% D1.  No intervention.  7. Chronic systolic CHF: Ischemic cardiomyopathy.  St Jude CRT-D.  - Echo (4/22) with EF < 20%, moderate-severe LV dilation, normal RV, severe LAE, severe MR.  - RHC (4/22): mean RA 3, PA 39/10, mean PCWP 12, CI 1.81 - Echo (6/22): EF < 20% with septal-lateral dyssynchrony, mildly decreased RV systolic function, moderate TR, moderate central MR with ERO 0.2 cm^2.  - Echo (7/22): EF < 20%, mild LVH, normal RV, moderate MR.  - Echo (1/23: EF < 20% with moderate LV dilation, mildly decreased RV systolic function, normal IVC, mild-moderate MR. 8. Mitral regurgitation: Severe on 4/22 echo.  - TEE (6/22) with moderate central MR with ERO 0.2 cm^2 (functional, annular dilatation).  - Echo (1/23) with mild-moderate MR.  - Echo (9/23): EF 20-25%, mild MR.  9. Elevated LFTs: Viral hepatitis labs negative.  RUQ [place] (6/22) was not suggestive of cirrhosis, ascites noted.  10. Hypercalcemia: Due to Ca supplementation in setting of renal dysfunction.  11. Chronic LBBB  Social History   Socioeconomic History   Marital status: Married    Spouse name: Not on file   Number of children: Not on file   Years of education: Not on file   Highest education level: Not on file  Occupational History   Not on file  Tobacco Use    Smoking status: Every Day    Current packs/day: 0.50    Average packs/day: 0.5 packs/day for 50.0 years (25.0 ttl pk-yrs)    Types: Cigarettes   Smokeless tobacco: Never  Vaping Use   Vaping status: Never Used  Substance and Sexual Activity   Alcohol use: Not Currently   Drug use: Never   Sexual activity: Not on file  Other Topics Concern   Not on file  Social History Narrative   Not on file   Social Determinants of Health   Financial Resource Strain: Low Risk  (05/22/2022)   Overall Financial Resource Strain (CARDIA)    Difficulty of Paying Living Expenses: Not very hard  Food Insecurity: No Food Insecurity (05/22/2022)   Hunger Vital Sign    Worried About Running Out of Food in the Last Year: Never true    Ran Out of Food in the Last Year: Never true  Transportation Needs: No Transportation Needs (05/22/2022)   PRAPARE - Administrator, Civil Service (Medical): No    Lack of Transportation (Non-Medical): No  Physical Activity: Not on file  Stress: Not on file  Social Connections: Not on file  Intimate Partner Violence: Not on file   Family History  Problem Relation Age of Onset   Leukemia Mother    Prostate cancer Father    Heart attack Brother    Colon cancer Neg Hx    Rectal cancer Neg Hx    Stomach cancer Neg Hx    Esophageal cancer Neg Hx    ROS: All systems reviewed and negative except as per HPI.   Current Outpatient Medications  Medication Sig Dispense Refill   aspirin EC 81 MG tablet Take 1 tablet (81 mg total) by mouth daily. 90 tablet 3   Cetirizine HCl 10 MG CAPS Take 1 capsule by mouth daily.     cholestyramine light (PREVALITE) 4 g packet Take 4 g by mouth 2 (two) times daily as needed.     dapagliflozin propanediol (FARXIGA) 10 MG TABS tablet Take 1 tablet (10 mg total) by mouth daily before breakfast. 90 tablet 3   digoxin (LANOXIN) 0.125 MG tablet Take 0.5 tablets (0.0625 mg total) by mouth daily. 15 tablet 5   enzalutamide (XTANDI) 40 MG  tablet Take 160 mg by mouth daily.     Evolocumab (REPATHA SURECLICK) 140 MG/ML SOAJ INJECT 1 DOSE UNDER THE SKIN EVERY 14 DAYS 6 mL 3   FISH OIL-KRILL OIL PO Take 500 mg by mouth daily.     metolazone (ZAROXOLYN) 2.5 MG tablet Take 1 tablet (2.5 mg total) by mouth as directed. Only take as directed by the advanced heart failure clinic 10 tablet 0   metoprolol succinate (TOPROL XL) 25 MG 24 hr tablet Take 1 tablet (25 mg total) by mouth at bedtime. 90 tablet 3   Multiple Vitamins-Minerals (PRESERVISION/LUTEIN) CAPS Take 1 capsule by mouth at bedtime.     omeprazole (PRILOSEC) 40 MG capsule Take 40 mg by mouth daily.     potassium chloride SA (KLOR-CON M) 20 MEQ tablet Take  1 tablet (20 mEq total) by mouth daily. 30 tablet 3   pregabalin (LYRICA) 100 MG capsule Take 100 mg by mouth 3 (three) times daily.     rosuvastatin (CRESTOR) 5 MG tablet Take 1 tablet (5 mg total) by mouth daily. 90 tablet 3   spironolactone (ALDACTONE) 25 MG tablet Take 1 tablet (25 mg total) by mouth daily. 90 tablet 1   tamsulosin (FLOMAX) 0.4 MG CAPS capsule Take 1 capsule (0.4 mg total) by mouth daily. 90 capsule 0   torsemide (DEMADEX) 20 MG tablet Take 4 tablets (80 mg total) by mouth in the morning AND 2 tablets (40 mg total) every evening. 180 tablet 6   traZODone (DESYREL) 100 MG tablet Take 50 mg by mouth at bedtime as needed for sleep.     No current facility-administered medications for this encounter.   Wt Readings from Last 3 Encounters:  07/14/23 80.4 kg (177 lb 3.2 oz)  07/09/23 79.3 kg (174 lb 12.8 oz)  07/02/23 79.5 kg (175 lb 3.2 oz)   BP 118/78 (BP Location: Right Arm, Patient Position: Sitting, Cuff Size: Normal)   Pulse 70   Wt 80.4 kg (177 lb 3.2 oz)   SpO2 98%   BMI 26.17 kg/m  General:  Well appearing elderly male HEENT: normal Neck: supple. no JVD.  Cor: PMI nondisplaced. Regular rate & rhythm. No rubs, gallops or murmurs. Lungs: clear Abdomen: soft, nontender, nondistended.   Extremities: no cyanosis, clubbing, rash, edema Neuro: alert & oriented x 3. Affect pleasant   Assessment/Plan: 1. CAD: Occluded mid LAD and 80% proximal stenosis in nondominant RCA in 4/22.  No intervention.  No chest pain.  - Continue ASA 81 mg daily.  - Continue rosuvastatin, Repatha.  Good lipids 2/24.  2. Chronic systolic CHF: Ischemic cardiomyopathy.  Echo in 4/22 with EF < 20%, moderate-severe LV dilation, normal RV, severe LAE, severe MR.  RHC in 4/22 with CI 1.8.  TEE in 6/22 with EF <20%, dyssynchrony, mildly decreased RV systolic function.  Echo in 7/22 with EF < 20%, moderate MR.  St Jude CRT-D device placed.  Echo in 1/23 showed EF < 20%, mild RV dysfunction.  Echo in 9/23 showed EF 20-25%, mild MR. No change to echo since CRT but he has felt better symptomatically.  NYHA class II/early III. Volume okay on exam and by device interrogation. Recommended an exercise regimen. - Continue Torsemide 80 mg q am and 40 mg q pm. - Uses 2.5 mg metolazone PRN - Continue Toprol XL 25 mg at bedtime. - Continue Farxiga 10 mg daily.     - Continue spironolactone 25 mg daily.   - Continue digoxin 0.0625 mg daily. Dig level 0.5 11/24 - Off Entresto and losartan with CKD.    - BMP stable on 06/18/23 - Check echo to reassess LV function/EF 3. Mitral regurgitation: Severe on 5/22 TTE. However, TEE in 6/22 showed moderate functional central MR.   Mild-moderate MR on echo 1/23 post-CRT, mild MR on 9/23 echo. Repeat echo as above. 4. Elevated LFTs: Viral hepatitis labs negative.  Abdominal US did not show cirrhosis but did show ascites.  Etiology uncertain, ? congestive hepatopathy.  5. CKD stage 3b: Continue SGLT2i.  - BMET today.  6. Smoking: He quit in 7/23.  Follow up with Dr. Shirlee Latch as previously scheduled next month  [facility], [facility] PA-C 07/14/2023

## 2023-07-14 NOTE — Patient Instructions (Addendum)
Medication Changes:  No Changes In Medications at this time.   Testing/Procedures:  Your physician has requested that you have an echocardiogram IN [place]. Echocardiography is a painless test that uses sound waves to create images of your heart. It provides your doctor with information about the size and shape of your heart and how well your heart's chambers and valves are working. This procedure takes approximately one hour. There are no restrictions for this procedure. Please do NOT wear cologne, perfume, aftershave, or lotions (deodorant is allowed). Please arrive 15 minutes prior to your appointment time.  Please note: We ask at that you not bring children with you during ultrasound (echo/ vascular) testing. Due to room size and safety concerns, children are not allowed in the ultrasound rooms during exams. Our front office staff cannot provide observation of children in our lobby area while testing is being conducted. An adult accompanying a patient to their appointment will only be allowed in the ultrasound room at the discretion of the ultrasound technician under special circumstances. We apologize for any inconvenience. SOMEONE WILL CALL YOU TO GET YOU SCHEDULED FOR THIS   Follow-Up in: AS SCHEDULED WITH DR. Shirlee Latch   At the [facility], you and your health needs are our priority. We have a designated team specialized in the treatment of Heart Failure. This Care Team includes your primary Heart Failure Specialized Cardiologist (physician), Advanced Practice Providers (APPs- Physician Assistants and Nurse Practitioners), and Pharmacist who all work together to provide you with the care you need, when you need it.   You may see any of the following providers on your designated Care Team at your next follow up:  Dr. Arvilla Meres Dr. Marca Ancona Dr. Dorthula Nettles Dr. Theresia Bough Tonye Becket, NP Robbie Lis, [place] [facility] [place], [place] Brynda Peon, NP [place] Lee, NP Karle Plumber, PharmD   Please be sure to bring in all your medications bottles to every appointment.   Need to Contact us:  If you have any questions or concerns before your next appointment please send [place] a message through [place] or call our office at [phone].    TO LEAVE A MESSAGE FOR THE NURSE SELECT OPTION 2, PLEASE LEAVE A MESSAGE INCLUDING: YOUR NAME DATE OF BIRTH CALL BACK NUMBER REASON FOR CALL**this is important as we prioritize the call backs  YOU WILL RECEIVE A CALL BACK THE SAME DAY AS LONG AS YOU CALL BEFORE 4:00 PM

## 2023-07-16 ENCOUNTER — Other Ambulatory Visit (HOSPITAL_COMMUNITY): Payer: Self-pay | Admitting: Emergency Medicine

## 2023-07-16 NOTE — Progress Notes (Signed)
Paramedicine Encounter    Patient ID: Tommy Ward, male    DOB: 11-Oct-1940, 82 y.o.   MRN: [id number]   Complaints - none   Assessment - lung sounds clear, no pedal edema noted.   Compliance with meds - no missed doses   Pill box filled - for 1x week   Refills needed - xtandi and loratadiine   Meds changes since last visit - none    Social changes - none    VISIT SUMMARY**  Met with Tommy Ward in his home. Pt reported no complaints today, denied shortness of breath, palpitations, chest pain, or dizziness. Pt reported physical activity of mulching leaves on the tractor, but reported plans to hop on his stationary bike later in the week. Pt was in good spirits. Pt's pill box was filled for 1x week. Will follow up in 1x week.   BP (!) 108/58   Pulse 62   Resp 16   Wt 174 lb 12.8 oz (79.3 kg)   SpO2 96%   BMI 25.81 kg/m  Weight yesterday-174 lbs Last visit weight-174 lbs   [place] EMT-P Community Paramedic  [phone]     ACTION: Home visit completed     Patient Care Team: Buckner Malta, MD as PCP - General (Family Medicine) Rennis Golden, Lisette Abu, MD as PCP - Cardiology (Cardiology) Lanier Prude, MD as PCP - Electrophysiology (Cardiology) Weston Settle, MD as Consulting Physician (Oncology) Dora Sims, DDS as Referring Physician (Oral Surgery) Debroah Baller, MD as Consulting Physician (Urology)  Patient Active Problem List   Diagnosis Date Noted   Vitamin D toxicity 07/24/2022   Anemia due to stage 4 chronic kidney disease (HCC) 03/20/2022   Hypercalcemia of malignancy 02/16/2021   Chronic systolic heart failure (HCC)    Abnormal stress test 12/05/2020   Depressed left ventricular ejection fraction 12/05/2020   Nonrheumatic mitral valve regurgitation 12/05/2020   Pre-diabetes 12/05/2020   Pulmonary hypertension (HCC) 12/05/2020   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HCC)  12/05/2020   Tobacco use 12/05/2020   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   HFrEF (heart failure with reduced ejection fraction) (HCC) 11/23/2020   CAD (coronary artery disease) 11/23/2020   GERD (gastroesophageal reflux disease)    Hyperlipidemia    Hypertension    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   Malignant neoplasm of prostate (HCC) 06/02/2020   AKI (acute kidney injury) (HCC) 11/25/2019   Hyperphosphatemia 11/25/2019   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   Hyperkalemia 08/24/2019   Metabolic bone disease 08/24/2019   Vitamin D deficiency 08/24/2019   Benign hypertension with chronic kidney disease, stage III (HCC) 05/20/2019   Decreased cardiac ejection fraction 05/20/2019   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) 05/20/2019   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 02/24/2019   Olecranon bursitis of left elbow 06/25/2018   B12 deficiency 03/22/2018   Gastroesophageal reflux disease without esophagitis 03/22/2018   Chronic renal insufficiency, stage III (moderate) (HCC) 11/27/2015   Aortic valve disorder 07/08/2014   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   Cataract 11/2013   Essential hypertension 07/06/2012   Coronary arteriosclerosis 07/06/2012   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   Left bundle branch block 07/06/2012   Mixed hyperlipidemia 07/06/2012   Cancer (HCC) 08/2008   S/P radiation therapy > 12 wks ago 2010    Current Outpatient Medications:  aspirin EC 81 MG tablet, Take 1 tablet (81 mg total) by mouth daily., Disp: 90 tablet, Rfl: 3   Cetirizine HCl 10 MG CAPS, Take 1 capsule by mouth daily., Disp: , Rfl:    cholestyramine light (PREVALITE) 4 g packet, Take 4 g by mouth 2 (two) times daily as needed., Disp: , Rfl:    dapagliflozin propanediol (FARXIGA) 10 MG TABS tablet, Take 1 tablet (10  mg total) by mouth daily before breakfast., Disp: 90 tablet, Rfl: 3   digoxin (LANOXIN) 0.125 MG tablet, Take 0.5 tablets (0.0625 mg total) by mouth daily., Disp: 15 tablet, Rfl: 5   enzalutamide (XTANDI) 40 MG tablet, Take 160 mg by mouth daily., Disp: , Rfl:    Evolocumab (REPATHA SURECLICK) 140 MG/ML SOAJ, INJECT 1 DOSE UNDER THE SKIN EVERY 14 DAYS, Disp: 6 mL, Rfl: 3   FISH OIL-KRILL OIL PO, Take 500 mg by mouth daily., Disp: , Rfl:    metoprolol succinate (TOPROL XL) 25 MG 24 hr tablet, Take 1 tablet (25 mg total) by mouth at bedtime., Disp: 90 tablet, Rfl: 3   Multiple Vitamins-Minerals (PRESERVISION/LUTEIN) CAPS, Take 1 capsule by mouth at bedtime., Disp: , Rfl:    omeprazole (PRILOSEC) 40 MG capsule, Take 40 mg by mouth daily., Disp: , Rfl:    potassium chloride SA (KLOR-CON M) 20 MEQ tablet, Take 1 tablet (20 mEq total) by mouth daily., Disp: 30 tablet, Rfl: 3   pregabalin (LYRICA) 100 MG capsule, Take 100 mg by mouth 3 (three) times daily., Disp: , Rfl:    rosuvastatin (CRESTOR) 5 MG tablet, Take 1 tablet (5 mg total) by mouth daily., Disp: 90 tablet, Rfl: 3   spironolactone (ALDACTONE) 25 MG tablet, Take 1 tablet (25 mg total) by mouth daily., Disp: 90 tablet, Rfl: 1   tamsulosin (FLOMAX) 0.4 MG CAPS capsule, Take 1 capsule (0.4 mg total) by mouth daily., Disp: 90 capsule, Rfl: 0   torsemide (DEMADEX) 20 MG tablet, Take 4 tablets (80 mg total) by mouth in the morning AND 2 tablets (40 mg total) every evening., Disp: 180 tablet, Rfl: 6   traZODone (DESYREL) 100 MG tablet, Take 50 mg by mouth at bedtime as needed for sleep., Disp: , Rfl:    metolazone (ZAROXOLYN) 2.5 MG tablet, Take 1 tablet (2.5 mg total) by mouth as directed. Only take as directed by the advanced heart failure clinic (Patient not taking: Reported on 07/16/2023), Disp: 10 tablet, Rfl: 0 Allergies  Allergen Reactions   Ezetimibe Other (See Comments)    Myalgia   Gabapentin Itching and Other (See Comments)    Sore  throat, breakouts   Statins Other (See Comments)    Myalgias (intolerance)     Social History   Socioeconomic History   Marital status: Married    Spouse name: Not on file   Number of children: Not on file   Years of education: Not on file   Highest education level: Not on file  Occupational History   Not on file  Tobacco Use   Smoking status: Every Day    Current packs/day: 0.50    Average packs/day: 0.5 packs/day for 50.0 years (25.0 ttl pk-yrs)    Types: Cigarettes   Smokeless tobacco: Never  Vaping Use   Vaping status: Never Used  Substance and Sexual Activity   Alcohol use: Not Currently   Drug use: Never   Sexual activity: Not on file  Other Topics Concern   Not on file  Social History Narrative  Not on file   Social Determinants of Health   Financial Resource Strain: Low Risk  (05/22/2022)   Overall Financial Resource Strain (CARDIA)    Difficulty of Paying Living Expenses: Not very hard  Food Insecurity: No Food Insecurity (05/22/2022)   Hunger Vital Sign    Worried About Running Out of Food in the Last Year: Never true    Ran Out of Food in the Last Year: Never true  Transportation Needs: No Transportation Needs (05/22/2022)   PRAPARE - Administrator, Civil Service (Medical): No    Lack of Transportation (Non-Medical): No  Physical Activity: Not on file  Stress: Not on file  Social Connections: Not on file  Intimate Partner Violence: Not on file    Physical Exam      Future Appointments  Date Time Provider Department Center  08/04/2023  2:00 PM Laurey Morale, MD [facility] None  08/06/2023  8:30 AM [facility] [place] [facility] None  08/07/2023  2:20 PM Revankar, Aundra Dubin, MD [facility] None  09/22/2023  3:40 PM [facility] DEVICE REMOTES [facility]  10/07/2023 11:15 AM [place]if[place], Jenelle Mages, DPM [facility]  10/13/2023 10:45 AM [facility] None  10/14/2023 10:30 AM Weston Settle, MD [facility] None   12/22/2023  3:40 PM [facility] DEVICE REMOTES [facility]  03/22/2024  3:40 PM [facility] DEVICE REMOTES [facility]  06/21/2024  3:40 PM [facility] DEVICE REMOTES [facility]  09/20/2024  3:40 PM [facility] DEVICE REMOTES [facility]  12/20/2024  3:40 PM [facility] DEVICE REMOTES [facility]  03/21/2025  3:40 PM [facility] DEVICE REMOTES [facility]  06/20/2025  3:40 PM [facility] DEVICE REMOTES [facility]  09/19/2025  3:40 PM [facility] DEVICE REMOTES [facility]

## 2023-07-21 NOTE — Progress Notes (Signed)
Remote ICD transmission.   

## 2023-07-23 ENCOUNTER — Other Ambulatory Visit (HOSPITAL_COMMUNITY): Payer: Self-pay | Admitting: Emergency Medicine

## 2023-07-23 NOTE — Progress Notes (Signed)
Paramedicine Encounter    Patient ID: Tommy Ward, male    DOB: March 01, 1941, 82 y.o.   MRN: [id number]   Complaints - none  Assessment - no pedal edema and lung sounds cle  Compliance with meds - missed 2x noon doses   Pill box filled - for 1x week   Refills needed - torsemide, omeprazole  Meds changes since last visit- none     Social changes - none    VISIT SUMMARY**  I met with Tommy Ward in the home. Pt advised that he has no complaints at present. Pt advised that he enjoyed his holiday without any weight gain or increased edema noted. Pt advised that he was planning on beginning his biking journey this week. Will follow up on how it goes. Pt's pill box was filled for 1x week. All upcoming appointments were reviewed. Will follow up in 1x week.   BP 112/62   Pulse 70   Resp 16   Wt 174 lb 6.4 oz (79.1 kg)   SpO2 97%   BMI 25.75 kg/m  Weight yesterday- 174.6 lbs Last visit weight-174 lbs    [facility] EMT-P Community Paramedic  [phone]     ACTION: Home visit completed     Patient Care Team: Buckner Malta, MD as PCP - General (Family Medicine) Rennis Golden Lisette Abu, MD as PCP - Cardiology (Cardiology) Lanier Prude, MD as PCP - Electrophysiology (Cardiology) Weston Settle, MD as Consulting Physician (Oncology) Dora Sims, DDS as Referring Physician (Oral Surgery) Debroah Baller, MD as Consulting Physician (Urology)  Patient Active Problem List   Diagnosis Date Noted   Vitamin D toxicity 07/24/2022   Anemia due to stage 4 chronic kidney disease (HCC) 03/20/2022   Hypercalcemia of malignancy 02/16/2021   Chronic systolic heart failure (HCC)    Abnormal stress test 12/05/2020   Depressed left ventricular ejection fraction 12/05/2020   Nonrheumatic mitral valve regurgitation 12/05/2020   Pre-diabetes 12/05/2020   Pulmonary hypertension (HCC) 12/05/2020   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   Severe pulmonary  hypertension (HCC) 12/05/2020   Tobacco use 12/05/2020   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   HFrEF (heart failure with reduced ejection fraction) (HCC) 11/23/2020   CAD (coronary artery disease) 11/23/2020   GERD (gastroesophageal reflux disease)    Hyperlipidemia    Hypertension    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   Malignant neoplasm of prostate (HCC) 06/02/2020   AKI (acute kidney injury) (HCC) 11/25/2019   Hyperphosphatemia 11/25/2019   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   Hyperkalemia 08/24/2019   Metabolic bone disease 08/24/2019   Vitamin D deficiency 08/24/2019   Benign hypertension with chronic kidney disease, stage III (HCC) 05/20/2019   Decreased cardiac ejection fraction 05/20/2019   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) 05/20/2019   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 02/24/2019   Olecranon bursitis of left elbow 06/25/2018   B12 deficiency 03/22/2018   Gastroesophageal reflux disease without esophagitis 03/22/2018   Chronic renal insufficiency, stage III (moderate) (HCC) 11/27/2015   Aortic valve disorder 07/08/2014   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   Cataract 11/2013   Essential hypertension 07/06/2012   Coronary arteriosclerosis 07/06/2012   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   Left bundle branch block 07/06/2012   Mixed hyperlipidemia 07/06/2012   Cancer (HCC) 08/2008   S/P radiation therapy > 12 wks ago 2010  Current Outpatient Medications:    aspirin EC 81 MG tablet, Take 1 tablet (81 mg total) by mouth daily., Disp: 90 tablet, Rfl: 3   Cetirizine HCl 10 MG CAPS, Take 1 capsule by mouth daily., Disp: , Rfl:    cholestyramine light (PREVALITE) 4 g packet, Take 4 g by mouth 2 (two) times daily as needed., Disp: , Rfl:    dapagliflozin propanediol (FARXIGA) 10 MG TABS tablet,  Take 1 tablet (10 mg total) by mouth daily before breakfast., Disp: 90 tablet, Rfl: 3   digoxin (LANOXIN) 0.125 MG tablet, Take 0.5 tablets (0.0625 mg total) by mouth daily., Disp: 15 tablet, Rfl: 5   enzalutamide (XTANDI) 40 MG tablet, Take 160 mg by mouth daily., Disp: , Rfl:    Evolocumab (REPATHA SURECLICK) 140 MG/ML SOAJ, INJECT 1 DOSE UNDER THE SKIN EVERY 14 DAYS, Disp: 6 mL, Rfl: 3   FISH OIL-KRILL OIL PO, Take 500 mg by mouth daily., Disp: , Rfl:    metoprolol succinate (TOPROL XL) 25 MG 24 hr tablet, Take 1 tablet (25 mg total) by mouth at bedtime., Disp: 90 tablet, Rfl: 3   Multiple Vitamins-Minerals (PRESERVISION/LUTEIN) CAPS, Take 1 capsule by mouth at bedtime., Disp: , Rfl:    omeprazole (PRILOSEC) 40 MG capsule, Take 40 mg by mouth daily., Disp: , Rfl:    potassium chloride SA (KLOR-CON M) 20 MEQ tablet, Take 1 tablet (20 mEq total) by mouth daily., Disp: 30 tablet, Rfl: 3   pregabalin (LYRICA) 100 MG capsule, Take 100 mg by mouth 3 (three) times daily., Disp: , Rfl:    rosuvastatin (CRESTOR) 5 MG tablet, Take 1 tablet (5 mg total) by mouth daily., Disp: 90 tablet, Rfl: 3   spironolactone (ALDACTONE) 25 MG tablet, Take 1 tablet (25 mg total) by mouth daily., Disp: 90 tablet, Rfl: 1   tamsulosin (FLOMAX) 0.4 MG CAPS capsule, Take 1 capsule (0.4 mg total) by mouth daily., Disp: 90 capsule, Rfl: 0   torsemide (DEMADEX) 20 MG tablet, Take 4 tablets (80 mg total) by mouth in the morning AND 2 tablets (40 mg total) every evening., Disp: 180 tablet, Rfl: 6   traZODone (DESYREL) 100 MG tablet, Take 50 mg by mouth at bedtime as needed for sleep., Disp: , Rfl:    metolazone (ZAROXOLYN) 2.5 MG tablet, Take 1 tablet (2.5 mg total) by mouth as directed. Only take as directed by the advanced heart failure clinic (Patient not taking: Reported on 07/16/2023), Disp: 10 tablet, Rfl: 0 Allergies  Allergen Reactions   Ezetimibe Other (See Comments)    Myalgia   Gabapentin Itching and Other (See  Comments)    Sore throat, breakouts   Statins Other (See Comments)    Myalgias (intolerance)     Social History   Socioeconomic History   Marital status: Married    Spouse name: Not on file   Number of children: Not on file   Years of education: Not on file   Highest education level: Not on file  Occupational History   Not on file  Tobacco Use   Smoking status: Every Day    Current packs/day: 0.50    Average packs/day: 0.5 packs/day for 50.0 years (25.0 ttl pk-yrs)    Types: Cigarettes   Smokeless tobacco: Never  Vaping Use   Vaping status: Never Used  Substance and Sexual Activity   Alcohol use: Not Currently   Drug use: Never   Sexual activity: Not on file  Other Topics Concern   Not on file  Social History Narrative   Not on file   Social Determinants of Health   Financial Resource Strain: Low Risk  (05/22/2022)   Overall Financial Resource Strain (CARDIA)    Difficulty of Paying Living Expenses: Not very hard  Food Insecurity: No Food Insecurity (05/22/2022)   Hunger Vital Sign    Worried About Running Out of Food in the Last Year: Never true    Ran Out of Food in the Last Year: Never true  Transportation Needs: No Transportation Needs (05/22/2022)   PRAPARE - Administrator, Civil Service (Medical): No    Lack of Transportation (Non-Medical): No  Physical Activity: Not on file  Stress: Not on file  Social Connections: Not on file  Intimate Partner Violence: Not on file    Physical Exam      Future Appointments  Date Time Provider Department Center  08/04/2023  2:00 PM Laurey Morale, MD [facility] None  08/06/2023  8:30 AM [facility] [place] [facility] None  08/07/2023  2:20 PM Revankar, Aundra Dubin, MD [facility] None  09/22/2023  3:40 PM [facility] DEVICE REMOTES [facility]  10/07/2023 11:15 AM Barbaraann Share, DPM [facility]  10/13/2023 10:45 AM [facility] None  10/14/2023 10:30 AM Weston Settle, MD [facility]  None  12/22/2023  3:40 PM [facility] DEVICE REMOTES [facility]  03/22/2024  3:40 PM [facility] DEVICE REMOTES [facility]  06/21/2024  3:40 PM [facility] DEVICE REMOTES [facility]  09/20/2024  3:40 PM [facility] DEVICE REMOTES [facility]  12/20/2024  3:40 PM [facility] DEVICE REMOTES [facility]  03/21/2025  3:40 PM [facility] DEVICE REMOTES [facility]  06/20/2025  3:40 PM [facility] DEVICE REMOTES [facility]  09/19/2025  3:40 PM [facility] DEVICE REMOTES [facility]

## 2023-07-30 ENCOUNTER — Other Ambulatory Visit (HOSPITAL_COMMUNITY): Payer: Self-pay | Admitting: Emergency Medicine

## 2023-07-30 NOTE — Progress Notes (Signed)
Paramedicine Encounter    Patient ID: Tommy Ward, male    DOB: 12/10/1940, 82 y.o.   MRN: [id number]   Complaints - no complaints  Assessment - lung sounds clear. No edema noted.   Compliance with meds - no missed medications  Pill box filled - for 1x week.   Refills needed - potassium and torsemide  Meds changes since last visit - none    Social changes - started riding stationary bike ~3x/ week for 15 min   VISIT SUMMARY**  I met with Tommy Ward in the home today. Pt had no complaints and advised that he felt well. Pt reported that he had been using his stationary bike for 15 minutes 3x/week. Pt advised that he cannot notice any changes, but reports that he will continue. Pt's pill box was filled for 1x week. Will follow up at [facility] appointment next week.   BP 108/80   Pulse 70   Resp 16   Wt 174 lb 6.4 oz (79.1 kg)   SpO2 97%   BMI 25.75 kg/m  Weight yesterday-174 lbs Last visit weight-174 lbs    [facility] EMT-P Community Paramedic  [phone]    ACTION: Home visit completed     Patient Care Team: Buckner Malta, MD as PCP - General (Family Medicine) Rennis Golden, Lisette Abu, MD as PCP - Cardiology (Cardiology) Lanier Prude, MD as PCP - Electrophysiology (Cardiology) Weston Settle, MD as Consulting Physician (Oncology) Dora Sims, DDS as Referring Physician (Oral Surgery) Debroah Baller, MD as Consulting Physician (Urology)  Patient Active Problem List   Diagnosis Date Noted   Vitamin D toxicity 07/24/2022   Anemia due to stage 4 chronic kidney disease (HCC) 03/20/2022   Hypercalcemia of malignancy 02/16/2021   Chronic systolic heart failure (HCC)    Abnormal stress test 12/05/2020   Depressed left ventricular ejection fraction 12/05/2020   Nonrheumatic mitral valve regurgitation 12/05/2020   Pre-diabetes 12/05/2020   Pulmonary hypertension (HCC) 12/05/2020   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   Severe pulmonary  hypertension (HCC) 12/05/2020   Tobacco use 12/05/2020   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   HFrEF (heart failure with reduced ejection fraction) (HCC) 11/23/2020   CAD (coronary artery disease) 11/23/2020   GERD (gastroesophageal reflux disease)    Hyperlipidemia    Hypertension    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   Malignant neoplasm of prostate (HCC) 06/02/2020   AKI (acute kidney injury) (HCC) 11/25/2019   Hyperphosphatemia 11/25/2019   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   Hyperkalemia 08/24/2019   Metabolic bone disease 08/24/2019   Vitamin D deficiency 08/24/2019   Benign hypertension with chronic kidney disease, stage III (HCC) 05/20/2019   Decreased cardiac ejection fraction 05/20/2019   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) 05/20/2019   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 02/24/2019   Olecranon bursitis of left elbow 06/25/2018   B12 deficiency 03/22/2018   Gastroesophageal reflux disease without esophagitis 03/22/2018   Chronic renal insufficiency, stage III (moderate) (HCC) 11/27/2015   Aortic valve disorder 07/08/2014   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   Cataract 11/2013   Essential hypertension 07/06/2012   Coronary arteriosclerosis 07/06/2012   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   Left bundle branch block 07/06/2012   Mixed hyperlipidemia 07/06/2012   Cancer (HCC) 08/2008   S/P radiation therapy > 12 wks ago 2010  Current Outpatient Medications:    aspirin EC 81 MG tablet, Take 1 tablet (81 mg total) by mouth daily., Disp: 90 tablet, Rfl: 3   Cetirizine HCl 10 MG CAPS, Take 1 capsule by mouth daily., Disp: , Rfl:    cholestyramine light (PREVALITE) 4 g packet, Take 4 g by mouth 2 (two) times daily as needed., Disp: , Rfl:    dapagliflozin propanediol (FARXIGA) 10 MG TABS tablet,  Take 1 tablet (10 mg total) by mouth daily before breakfast., Disp: 90 tablet, Rfl: 3   digoxin (LANOXIN) 0.125 MG tablet, Take 0.5 tablets (0.0625 mg total) by mouth daily., Disp: 15 tablet, Rfl: 5   enzalutamide (XTANDI) 40 MG tablet, Take 160 mg by mouth daily., Disp: , Rfl:    Evolocumab (REPATHA SURECLICK) 140 MG/ML SOAJ, INJECT 1 DOSE UNDER THE SKIN EVERY 14 DAYS, Disp: 6 mL, Rfl: 3   FISH OIL-KRILL OIL PO, Take 500 mg by mouth daily., Disp: , Rfl:    metoprolol succinate (TOPROL XL) 25 MG 24 hr tablet, Take 1 tablet (25 mg total) by mouth at bedtime., Disp: 90 tablet, Rfl: 3   Multiple Vitamins-Minerals (PRESERVISION/LUTEIN) CAPS, Take 1 capsule by mouth at bedtime., Disp: , Rfl:    omeprazole (PRILOSEC) 40 MG capsule, Take 40 mg by mouth daily., Disp: , Rfl:    potassium chloride SA (KLOR-CON M) 20 MEQ tablet, Take 1 tablet (20 mEq total) by mouth daily., Disp: 30 tablet, Rfl: 3   pregabalin (LYRICA) 100 MG capsule, Take 100 mg by mouth 3 (three) times daily., Disp: , Rfl:    rosuvastatin (CRESTOR) 5 MG tablet, Take 1 tablet (5 mg total) by mouth daily., Disp: 90 tablet, Rfl: 3   spironolactone (ALDACTONE) 25 MG tablet, Take 1 tablet (25 mg total) by mouth daily., Disp: 90 tablet, Rfl: 1   tamsulosin (FLOMAX) 0.4 MG CAPS capsule, Take 1 capsule (0.4 mg total) by mouth daily., Disp: 90 capsule, Rfl: 0   torsemide (DEMADEX) 20 MG tablet, Take 4 tablets (80 mg total) by mouth in the morning AND 2 tablets (40 mg total) every evening., Disp: 180 tablet, Rfl: 6   metolazone (ZAROXOLYN) 2.5 MG tablet, Take 1 tablet (2.5 mg total) by mouth as directed. Only take as directed by the advanced heart failure clinic (Patient not taking: Reported on 07/16/2023), Disp: 10 tablet, Rfl: 0   traZODone (DESYREL) 100 MG tablet, Take 50 mg by mouth at bedtime as needed for sleep. (Patient not taking: Reported on 07/30/2023), Disp: , Rfl:  Allergies  Allergen Reactions   Ezetimibe Other (See Comments)    Myalgia    Gabapentin Itching and Other (See Comments)    Sore throat, breakouts   Statins Other (See Comments)    Myalgias (intolerance)     Social History   Socioeconomic History   Marital status: Married    Spouse name: Not on file   Number of children: Not on file   Years of education: Not on file   Highest education level: Not on file  Occupational History   Not on file  Tobacco Use   Smoking status: Every Day    Current packs/day: 0.50    Average packs/day: 0.5 packs/day for 50.0 years (25.0 ttl pk-yrs)    Types: Cigarettes   Smokeless tobacco: Never  Vaping Use   Vaping status: Never Used  Substance and Sexual Activity   Alcohol use: Not Currently   Drug use: Never   Sexual activity: Not on file  Other Topics  Concern   Not on file  Social History Narrative   Not on file   Social Determinants of Health   Financial Resource Strain: Low Risk  (05/22/2022)   Overall Financial Resource Strain (CARDIA)    Difficulty of Paying Living Expenses: Not very hard  Food Insecurity: No Food Insecurity (05/22/2022)   Hunger Vital Sign    Worried About Running Out of Food in the Last Year: Never true    Ran Out of Food in the Last Year: Never true  Transportation Needs: No Transportation Needs (05/22/2022)   PRAPARE - Administrator, Civil Service (Medical): No    Lack of Transportation (Non-Medical): No  Physical Activity: Not on file  Stress: Not on file  Social Connections: Not on file  Intimate Partner Violence: Not on file    Physical Exam      Future Appointments  Date Time Provider Department Center  08/04/2023  2:00 PM Laurey Morale, MD [facility] None  08/06/2023  8:30 AM [facility] [place] [facility] None  08/07/2023  2:20 PM Revankar, Aundra Dubin, MD [facility] None  09/22/2023  3:40 PM [facility] DEVICE REMOTES [facility]  10/07/2023 11:15 AM Barbaraann Share, DPM [facility]  10/13/2023 10:45 AM [facility] None  10/14/2023 10:30  AM Weston Settle, MD [facility] None  12/22/2023  3:40 PM [facility] DEVICE REMOTES [facility]  03/22/2024  3:40 PM [facility] DEVICE REMOTES [facility]  06/21/2024  3:40 PM [facility] DEVICE REMOTES [facility]  09/20/2024  3:40 PM [facility]ICE REMOTES [facility]  12/20/2024  3:40 PM [facility] DEVICE REMOTES [facility]  03/21/2025  3:40 PM [facility] DEVICE REMOTES [facility]  06/20/2025  3:40 PM [facility] DEVICE REMOTES [facility]  09/19/2025  3:40 PM [facility] DEVICE REMOTES [facility]

## 2023-07-31 ENCOUNTER — Other Ambulatory Visit: Payer: Self-pay

## 2023-07-31 ENCOUNTER — Other Ambulatory Visit: Payer: Self-pay | Admitting: *Deleted

## 2023-07-31 DIAGNOSIS — E7849 Other hyperlipidemia: Secondary | ICD-10-CM

## 2023-08-04 ENCOUNTER — Ambulatory Visit (HOSPITAL_COMMUNITY)
Admission: RE | Admit: 2023-08-04 | Discharge: 2023-08-04 | Disposition: A | Discharge status: Home / Self Care | Payer: Medicare Other | Source: Ambulatory Visit | Attending: Cardiology | Admitting: Cardiology

## 2023-08-04 ENCOUNTER — Encounter (HOSPITAL_COMMUNITY): Payer: Self-pay | Admitting: Cardiology

## 2023-08-04 VITALS — BP 120/70 | HR 70 | Wt 175.2 lb

## 2023-08-04 DIAGNOSIS — Z79899 Other long term (current) drug therapy: Secondary | ICD-10-CM | POA: Insufficient documentation

## 2023-08-04 DIAGNOSIS — N183 Chronic kidney disease, stage 3 unspecified: Secondary | ICD-10-CM | POA: Insufficient documentation

## 2023-08-04 DIAGNOSIS — Z7982 Long term (current) use of aspirin: Secondary | ICD-10-CM | POA: Insufficient documentation

## 2023-08-04 DIAGNOSIS — I130 Hypertensive heart and chronic kidney disease with heart failure and stage 1 through stage 4 chronic kidney disease, or unspecified chronic kidney disease: Secondary | ICD-10-CM | POA: Insufficient documentation

## 2023-08-04 DIAGNOSIS — I5022 Chronic systolic (congestive) heart failure: Secondary | ICD-10-CM | POA: Insufficient documentation

## 2023-08-04 DIAGNOSIS — Z7984 Long term (current) use of oral hypoglycemic drugs: Secondary | ICD-10-CM | POA: Diagnosis not present

## 2023-08-04 DIAGNOSIS — I2510 Atherosclerotic heart disease of native coronary artery without angina pectoris: Secondary | ICD-10-CM | POA: Diagnosis present

## 2023-08-04 DIAGNOSIS — F17210 Nicotine dependence, cigarettes, uncomplicated: Secondary | ICD-10-CM | POA: Insufficient documentation

## 2023-08-04 DIAGNOSIS — I255 Ischemic cardiomyopathy: Secondary | ICD-10-CM | POA: Diagnosis not present

## 2023-08-04 DIAGNOSIS — I340 Nonrheumatic mitral (valve) insufficiency: Secondary | ICD-10-CM | POA: Insufficient documentation

## 2023-08-04 LAB — BASIC METABOLIC PANEL
Anion gap: 12 (ref 5–15)
BUN: 42 mg/dL — ABNORMAL HIGH (ref 8–23)
CO2: 29 mmol/L (ref 22–32)
Calcium: 9.8 mg/dL (ref 8.9–10.3)
Chloride: 97 mmol/L — ABNORMAL LOW (ref 98–111)
Creatinine, Ser: 2.19 mg/dL — ABNORMAL HIGH (ref 0.61–1.24)
GFR, Estimated: 30 mL/min — ABNORMAL LOW (ref >60)
Glucose, Bld: 116 mg/dL — ABNORMAL HIGH (ref 70–99)
Potassium: 4.2 mmol/L (ref 3.5–5.1)
Sodium: 138 mmol/L (ref 135–145)

## 2023-08-04 LAB — DIGOXIN LEVEL: Digoxin Level: 1.1 ng/mL (ref 0.8–2.0)

## 2023-08-04 LAB — LIPID PANEL
Cholesterol: 113 mg/dL (ref 0–200)
HDL: 50 mg/dL (ref >40)
LDL Cholesterol: 14 mg/dL (ref 0–99)
Total CHOL/HDL Ratio: 2.3 {ratio}
Triglycerides: 246 mg/dL — ABNORMAL HIGH (ref <150)
VLDL: 49 mg/dL — ABNORMAL HIGH (ref 0–40)

## 2023-08-04 LAB — BRAIN NATRIURETIC PEPTIDE: B Natriuretic Peptide: 1327.6 pg/mL — ABNORMAL HIGH (ref 0.0–100.0)

## 2023-08-04 NOTE — Progress Notes (Signed)
PCP: Buckner Malta, MD Cardiology: Dr. Tomie China HF Cardiology: Dr. Shirlee Latch  82 y.o. with history of prostate cancer, CKD stage 3, CAD, and ischemic cardiomyopathy Echo done in 4/22, showing EF < 20%, moderate-severe LV dilation, normal RV, severe MR.  LHC/RHC : low cardiac index at 1.81 and occluded mid LAD.  No intervention.  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   TEE 6/22, showing EF < 20% with septal-lateral dyssynchrony, mildly decreased RV systolic function, moderate TR, moderate central MR with ERO 0.2 cm^2.  Echo in 7/22 showed EF 20%, normal RV, moderate MR.  Patient had St Jude CRT-D device implanted.  Echo in 1/23 showed EF <20% with moderate LV dilation, mildly decreased RV systolic function, normal IVC.   Echo in 9/23 showed EF 20-25%, mild MR.   He returns for followup of CHF with his wife.  He feels like his breathing is doing better.  Weight down 2 lbs. He had felt worse off digoxin, now back on digoxin 0.0625 daily and feeling better.  No chest pain.  No orthopnea/PND.  He is not short of breath walking on flat ground.   Device interrogation (personally reviewed): No AT/AF or sustained VT, 95% BiV paced, 73% a-paced, stable thoracic impedance.   ECG (personally reviewed): A-V dual paced  Labs (5/22): K 3.4, creatinine 1.7 => 2.15, AST 123 => 189, ALT 322 =>190, tbili 1.7, Hgb 12, LDL 38 Labs (6/22): Viral hepatitis serologies negative Labs (8/22): K 4.2, creatinine 1.87 Labs (10/22): K 3.7, creatinine 2 Labs (11/22): LDL 159 Labs (12/22): K 3.7, creatinine 2 Labs (4/23): K 4.4, creatinine 2.7, LFTs normal Labs (7/23): K 4.4, creatinine 2.35 Labs (10/23): K 4.0, creatinine 2.4, LFTs normal Labs (2/24): K 4.9, creatinine 2.15, LDL 46, TGs 226 Labs (6/24): K 3.9, creatinine 2.19 Labs (9/24): K 4.3, creatinine 2.03 Labs 10/24): Scr 2.11, K 4.5 Labs (11/24): Digoxin level 0.5  PMH:   1. Hyperlipidemia 2. HTN 3. CKD stage 3 4. 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  Treated with radiation and currently on denosumab.   5. Cholecystectomy 6. CAD: LHC in 4/22 with 80% proximal RCA stenosis (nondominant), totally occluded mLAD with collaterals, 60% D1.  No intervention.  7. Chronic systolic CHF: Ischemic cardiomyopathy.  St Jude CRT-D.  - Echo (4/22) with EF < 20%, moderate-severe LV dilation, normal RV, severe LAE, severe MR.  - RHC (4/22): mean RA 3, PA 39/10, mean PCWP 12, CI 1.81 - Echo (6/22): EF < 20% with septal-lateral dyssynchrony, mildly decreased RV systolic function, moderate TR, moderate central MR with ERO 0.2 cm^2.  - Echo (7/22): EF < 20%, mild LVH, normal RV, moderate MR.  - Echo (1/23: EF < 20% with moderate LV dilation, mildly decreased RV systolic function, normal IVC, mild-moderate MR. 8. Mitral regurgitation: Severe on 4/22 echo.  - TEE (6/22) with moderate central MR with ERO 0.2 cm^2 (functional, annular dilatation).  - Echo (1/23) with mild-moderate MR.  - Echo (9/23): EF 20-25%, mild MR.  9. Elevated LFTs: Viral hepatitis labs negative.  RUQ [place] (6/22) was not suggestive of cirrhosis, ascites noted.  10. Hypercalcemia: Due to Ca supplementation in setting of renal dysfunction.  11. Chronic LBBB  Social History   Socioeconomic History   Marital status: Married    Spouse name: Not on file   Number of children: Not on file   Years of education: Not on file   Highest education level: Not on  file  Occupational History   Not on file  Tobacco Use   Smoking status: Every Day    Current packs/day: 0.50    Average packs/day: 0.5 packs/day for 50.0 years (25.0 ttl pk-yrs)    Types: Cigarettes   Smokeless tobacco: Never  Vaping Use   Vaping status: Never Used  Substance and Sexual Activity   Alcohol use: Not Currently   Drug use: Never   Sexual activity: Not on file  Other Topics Concern   Not on file  Social History Narrative   Not on file    Social Drivers of Health   Financial Resource Strain: Low Risk  (05/22/2022)   Overall Financial Resource Strain (CARDIA)    Difficulty of Paying Living Expenses: Not very hard  Food Insecurity: No Food Insecurity (05/22/2022)   Hunger Vital Sign    Worried About Running Out of Food in the Last Year: Never true    Ran Out of Food in the Last Year: Never true  Transportation Needs: No Transportation Needs (05/22/2022)   PRAPARE - Administrator, Civil Service (Medical): No    Lack of Transportation (Non-Medical): No  Physical Activity: Not on file  Stress: Not on file  Social Connections: Not on file  Intimate Partner Violence: Not on file   Family History  Problem Relation Age of Onset   Leukemia Mother    Prostate cancer Father    Heart attack Brother    Colon cancer Neg Hx    Rectal cancer Neg Hx    Stomach cancer Neg Hx    Esophageal cancer Neg Hx    ROS: All systems reviewed and negative except as per HPI.   Current Outpatient Medications  Medication Sig Dispense Refill   aspirin EC 81 MG tablet Take 1 tablet (81 mg total) by mouth daily. 90 tablet 3   Cetirizine HCl 10 MG CAPS Take 1 capsule by mouth daily.     cholestyramine light (PREVALITE) 4 g packet Take 4 g by mouth 2 (two) times daily as needed (cholesterol).     dapagliflozin propanediol (FARXIGA) 10 MG TABS tablet Take 1 tablet (10 mg total) by mouth daily before breakfast. 90 tablet 3   digoxin (LANOXIN) 0.125 MG tablet Take 0.5 tablets (0.0625 mg total) by mouth daily. 15 tablet 5   enzalutamide (XTANDI) 40 MG tablet Take 160 mg by mouth daily.     Evolocumab (REPATHA SURECLICK) 140 MG/ML SOAJ INJECT 1 DOSE UNDER THE SKIN EVERY 14 DAYS 6 mL 3   FISH OIL-KRILL OIL PO Take 500 mg by mouth daily.     metolazone (ZAROXOLYN) 2.5 MG tablet Take 1 tablet (2.5 mg total) by mouth as directed. Only take as directed by the advanced heart failure clinic 10 tablet 0   metoprolol succinate (TOPROL XL) 25 MG 24  hr tablet Take 1 tablet (25 mg total) by mouth at bedtime. 90 tablet 3   Multiple Vitamins-Minerals (PRESERVISION/LUTEIN) CAPS Take 1 capsule by mouth at bedtime.     omeprazole (PRILOSEC) 40 MG capsule Take 40 mg by mouth daily.     potassium chloride SA (KLOR-CON M) 20 MEQ tablet Take 1 tablet (20 mEq total) by mouth daily. 30 tablet 3   pregabalin (LYRICA) 100 MG capsule Take 100 mg by mouth 3 (three) times daily.     rosuvastatin (CRESTOR) 5 MG tablet Take 1 tablet (5 mg total) by mouth daily. 90 tablet 3   spironolactone (ALDACTONE) 25 MG tablet Take 1  tablet (25 mg total) by mouth daily. 90 tablet 1   tamsulosin (FLOMAX) 0.4 MG CAPS capsule Take 1 capsule (0.4 mg total) by mouth daily. 90 capsule 0   torsemide (DEMADEX) 20 MG tablet Take 4 tablets (80 mg total) by mouth in the morning AND 2 tablets (40 mg total) every evening. 180 tablet 6   traZODone (DESYREL) 100 MG tablet Take 50 mg by mouth at bedtime as needed for sleep.     No current facility-administered medications for this encounter.   Wt Readings from Last 3 Encounters:  08/04/23 79.5 kg (175 lb 3.2 oz)  07/30/23 79.1 kg (174 lb 6.4 oz)  07/23/23 79.1 kg (174 lb 6.4 oz)   BP 120/70   Pulse 70   Wt 79.5 kg (175 lb 3.2 oz)   SpO2 96%   BMI 25.87 kg/m  General: NAD Neck: No JVD, no thyromegaly or thyroid nodule.  Lungs: Clear to auscultation bilaterally with normal respiratory effort. CV: Nondisplaced PMI.  Heart regular S1/S2, no S3/S4, no murmur.  No peripheral edema.  No carotid bruit.  Normal pedal pulses.  Abdomen: Soft, nontender, no hepatosplenomegaly, no distention.  Skin: Intact without lesions or rashes.  Neurologic: Alert and oriented x 3.  Psych: Normal affect. Extremities: No clubbing or cyanosis.  HEENT: Normal.   Assessment/Plan: 1. CAD: Occluded mid LAD and 80% proximal stenosis in nondominant RCA in 4/22.  No intervention.  No chest pain.  - Continue ASA 81 mg daily.  - Continue rosuvastatin,  Repatha.  Check lipids today.   2. Chronic systolic CHF: Ischemic cardiomyopathy.  Echo in 4/22 with EF < 20%, moderate-severe LV dilation, normal RV, severe LAE, severe MR.  RHC in 4/22 with CI 1.8.  TEE in 6/22 with EF <20%, dyssynchrony, mildly decreased RV systolic function.  Echo in 7/22 with EF < 20%, moderate MR.  St Jude CRT-D device placed.  Echo in 1/23 showed EF < 20%, mild RV dysfunction.  Echo in 9/23 showed EF 20-25%, mild MR. NYHA class II, not volume overloaded by exam or [place].  GDMT limited by CKD.  - Continue Torsemide 80 mg q am and 40 mg q pm.  BMET/BNP today.  - Continue Toprol XL 25 mg at bedtime. - Continue Farxiga 10 mg daily.     - Continue spironolactone 25 mg daily.   - Continue digoxin 0.0625 mg daily. Check digoxin level.  - Off Entresto and losartan with elevated creatinine.    - I will arrange for repeat echo.  - We discussed barostimulation activation therapy.  He is a candidate for this if he symptomatically worsens.  3. Mitral regurgitation: Severe on 5/22 TTE. However, TEE in 6/22 showed moderate functional central MR.   Mild-moderate MR on echo 1/23 post-CRT, mild MR on 9/23 echo. Repeat echo as above. 4. CKD stage 3: Continue SGLT2i.  - BMET today.   Followup in 3 months with APP.   Tommy Ward  08/04/2023

## 2023-08-04 NOTE — Patient Instructions (Signed)
 There has been no changes to your medications.  Labs done today, your results will be available in MyChart, we will contact you for abnormal readings.  Your physician recommends that you schedule a follow-up appointment in: 3 months.  If you have any questions or concerns before your next appointment please send [place] a message through [place] or call our office at [phone].    TO LEAVE A MESSAGE FOR THE NURSE SELECT OPTION 2, PLEASE LEAVE A MESSAGE INCLUDING: YOUR NAME DATE OF BIRTH CALL BACK NUMBER REASON FOR CALL**this is important as we prioritize the call backs  YOU WILL RECEIVE A CALL BACK THE SAME DAY AS LONG AS YOU CALL BEFORE 4:00 PM  At the [facility], you and your health needs are our priority. As part of our continuing mission to provide you with exceptional heart care, we have created designated Provider Care Teams. These Care Teams include your primary Cardiologist (physician) and Advanced Practice Providers (APPs- Physician Assistants and Nurse Practitioners) who all work together to provide you with the care you need, when you need it.   You may see any of the following providers on your designated Care Team at your next follow up: Dr Arvilla Meres Dr Marca Ancona Dr. Dorthula Nettles Dr. Clearnce Hasten Amy Filbert Schilder, NP Robbie Lis, [place] [facility] [place], [place] Brynda Peon, NP [place] Lee, NP Karle Plumber, PharmD   Please be sure to bring in all your medications bottles to every appointment.    Thank you for choosing [facility]

## 2023-08-05 ENCOUNTER — Telehealth (HOSPITAL_COMMUNITY): Payer: Self-pay

## 2023-08-05 NOTE — Telephone Encounter (Addendum)
Pt aware, agreeable, and verbalized understanding  Getting labs done 12/19  ----- Message from Marca Ancona sent at 08/04/2023  3:48 PM EST ----- Excellent LDL, stable renal function.  Please repeat digoxin level as a trough (check before he takes am digoxin).

## 2023-08-06 ENCOUNTER — Other Ambulatory Visit (HOSPITAL_COMMUNITY): Payer: Self-pay | Admitting: Emergency Medicine

## 2023-08-06 ENCOUNTER — Ambulatory Visit: Payer: Medicare Other | Attending: Physician Assistant

## 2023-08-06 ENCOUNTER — Other Ambulatory Visit (HOSPITAL_COMMUNITY): Payer: Self-pay

## 2023-08-06 DIAGNOSIS — I5022 Chronic systolic (congestive) heart failure: Secondary | ICD-10-CM

## 2023-08-06 MED ORDER — PERFLUTREN LIPID MICROSPHERE
1.0000 mL | INTRAVENOUS | Status: AC | PRN
Start: 2023-08-06 — End: 2023-08-06
  Administered 2023-08-06: 8 mL via INTRAVENOUS

## 2023-08-06 NOTE — Progress Notes (Unsigned)
Paramedicine Encounter    Patient ID: Tommy Ward, male    DOB: 1941/01/20, 82 y.o.   MRN: [id number]   Complaints - increased leg pain   Assessment - no edema, lung sounds clear   Compliance with meds - no missed doses.   Pill box filled - for 2x weeks   Refills needed - farxiga, xtandi, metoprolol, multivitamin, fish oil   Meds changes since last visit - Holding Digoxin 12/19 for labs    Social changes - none   VISIT SUMMARY**  I met with Tommy Ward in the home today. Umut advised that he was doing well. We reviewed his notes from his clinic appointment. Pt had no questions and felt confident about the appointment. Pt had also completed his echo, no notes on same at present. Pt denied any increased swelling or increased exertional shortness of breath. Pt's pill box was filled for 2x weeks. Dr. Shirlee Latch ordered a digoxin trough and requested we hold digoxin 12/19, med change reflected in pill box. Upcoming appointment were reviewed. Will follow up on 12/30.   BP 106/62   Pulse 70   Resp 16   Wt 175 lb 3.2 oz (79.5 kg)   SpO2 96%   BMI 25.87 kg/m  Weight yesterday-175 lbs Last visit weight-174 lbs    [facility] [facility] EMT-P Community Paramedic  [phone]    ACTION: Home visit completed     Patient Care Team: Buckner Malta, MD as PCP - General (Family Medicine) Rennis Golden Lisette Abu, MD as PCP - Cardiology (Cardiology) Lanier Prude, MD as PCP - Electrophysiology (Cardiology) Weston Settle, MD as Consulting Physician (Oncology) Dora Sims, DDS as Referring Physician (Oral Surgery) Debroah Baller, MD as Consulting Physician (Urology)  Patient Active Problem List   Diagnosis Date Noted   Vitamin D toxicity 07/24/2022   Anemia due to stage 4 chronic kidney disease (HCC) 03/20/2022   Hypercalcemia of malignancy 02/16/2021   Chronic systolic heart failure (HCC)    Abnormal stress test 12/05/2020   Depressed left ventricular ejection fraction 12/05/2020    Nonrheumatic mitral valve regurgitation 12/05/2020   Pre-diabetes 12/05/2020   Pulmonary hypertension (HCC) 12/05/2020   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HCC) 12/05/2020   Tobacco use 12/05/2020   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   HFrEF (heart failure with reduced ejection fraction) (HCC) 11/23/2020   CAD (coronary artery disease) 11/23/2020   GERD (gastroesophageal reflux disease)    Hyperlipidemia    Hypertension    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   Malignant neoplasm of prostate (HCC) 06/02/2020   AKI (acute kidney injury) (HCC) 11/25/2019   Hyperphosphatemia 11/25/2019   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   Hyperkalemia 08/24/2019   Metabolic bone disease 08/24/2019   Vitamin D deficiency 08/24/2019   Benign hypertension with chronic kidney disease, stage III (HCC) 05/20/2019   Decreased cardiac ejection fraction 05/20/2019   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) 05/20/2019   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 02/24/2019   Olecranon bursitis of left elbow 06/25/2018   B12 deficiency 03/22/2018   Gastroesophageal reflux disease without esophagitis 03/22/2018   Chronic renal insufficiency, stage III (moderate) (HCC) 11/27/2015   Aortic valve disorder 07/08/2014   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   Cataract 11/2013   Essential hypertension 07/06/2012   Coronary arteriosclerosis 07/06/2012   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  Left bundle branch block 07/06/2012   Mixed hyperlipidemia 07/06/2012   Cancer (HCC) 08/2008   S/P radiation therapy > 12 wks ago 2010    Current Outpatient Medications:    aspirin EC 81 MG tablet, Take 1 tablet (81 mg total) by mouth daily., Disp: 90 tablet, Rfl: 3   Cetirizine HCl 10 MG CAPS, Take 1 capsule  by mouth daily., Disp: , Rfl:    cholestyramine light (PREVALITE) 4 g packet, Take 4 g by mouth 2 (two) times daily as needed (cholesterol)., Disp: , Rfl:    dapagliflozin propanediol (FARXIGA) 10 MG TABS tablet, Take 1 tablet (10 mg total) by mouth daily before breakfast., Disp: 90 tablet, Rfl: 3   digoxin (LANOXIN) 0.125 MG tablet, Take 0.5 tablets (0.0625 mg total) by mouth daily., Disp: 15 tablet, Rfl: 5   enzalutamide (XTANDI) 40 MG tablet, Take 160 mg by mouth daily., Disp: , Rfl:    Evolocumab (REPATHA SURECLICK) 140 MG/ML SOAJ, INJECT 1 DOSE UNDER THE SKIN EVERY 14 DAYS, Disp: 6 mL, Rfl: 3   FISH OIL-KRILL OIL PO, Take 500 mg by mouth daily., Disp: , Rfl:    metoprolol succinate (TOPROL XL) 25 MG 24 hr tablet, Take 1 tablet (25 mg total) by mouth at bedtime., Disp: 90 tablet, Rfl: 3   Multiple Vitamins-Minerals (PRESERVISION/LUTEIN) CAPS, Take 1 capsule by mouth at bedtime., Disp: , Rfl:    omeprazole (PRILOSEC) 40 MG capsule, Take 40 mg by mouth daily., Disp: , Rfl:    potassium chloride SA (KLOR-CON M) 20 MEQ tablet, Take 1 tablet (20 mEq total) by mouth daily., Disp: 30 tablet, Rfl: 3   pregabalin (LYRICA) 100 MG capsule, Take 100 mg by mouth 3 (three) times daily., Disp: , Rfl:    rosuvastatin (CRESTOR) 5 MG tablet, Take 1 tablet (5 mg total) by mouth daily., Disp: 90 tablet, Rfl: 3   spironolactone (ALDACTONE) 25 MG tablet, Take 1 tablet (25 mg total) by mouth daily., Disp: 90 tablet, Rfl: 1   tamsulosin (FLOMAX) 0.4 MG CAPS capsule, Take 1 capsule (0.4 mg total) by mouth daily., Disp: 90 capsule, Rfl: 0   torsemide (DEMADEX) 20 MG tablet, Take 4 tablets (80 mg total) by mouth in the morning AND 2 tablets (40 mg total) every evening., Disp: 180 tablet, Rfl: 6   traZODone (DESYREL) 100 MG tablet, Take 25 mg by mouth at bedtime as needed for sleep., Disp: , Rfl:    metolazone (ZAROXOLYN) 2.5 MG tablet, Take 1 tablet (2.5 mg total) by mouth as directed. Only take as directed by the  advanced heart failure clinic (Patient not taking: Reported on 08/06/2023), Disp: 10 tablet, Rfl: 0 Allergies  Allergen Reactions   Ezetimibe Other (See Comments)    Myalgia   Gabapentin Itching and Other (See Comments)    Sore throat, breakouts   Statins Other (See Comments)    Myalgias (intolerance)     Social History   Socioeconomic History   Marital status: Married    Spouse name: Not on file   Number of children: Not on file   Years of education: Not on file   Highest education level: Not on file  Occupational History   Not on file  Tobacco Use   Smoking status: Every Day    Current packs/day: 0.50    Average packs/day: 0.5 packs/day for 50.0 years (25.0 ttl pk-yrs)    Types: Cigarettes   Smokeless tobacco: Never  Vaping Use   Vaping status: Never Used  Substance and  Sexual Activity   Alcohol use: Not Currently   Drug use: Never   Sexual activity: Not on file  Other Topics Concern   Not on file  Social History Narrative   Not on file   Social Drivers of Health   Financial Resource Strain: Low Risk  (05/22/2022)   Overall Financial Resource Strain (CARDIA)    Difficulty of Paying Living Expenses: Not very hard  Food Insecurity: No Food Insecurity (05/22/2022)   Hunger Vital Sign    Worried About Running Out of Food in the Last Year: Never true    Ran Out of Food in the Last Year: Never true  Transportation Needs: No Transportation Needs (05/22/2022)   PRAPARE - Administrator, Civil Service (Medical): No    Lack of Transportation (Non-Medical): No  Physical Activity: Not on file  Stress: Not on file  Social Connections: Not on file  Intimate Partner Violence: Not on file    Physical Exam      Future Appointments  Date Time Provider Department Center  08/07/2023  2:20 PM Revankar, Aundra Dubin, MD [facility] None  09/22/2023  3:40 PM [facility] DEVICE REMOTES [facility]  10/07/2023 11:15 AM Barbaraann Share, DPM [facility]   10/13/2023 10:45 AM [facility] None  10/14/2023 10:30 AM Weston Settle, MD [facility] None  11/03/2023 11:00 AM [facility] PA/NP [facility] None  12/22/2023  3:40 PM [facility] DEVICE REMOTES [facility]  03/22/2024  3:40 PM [facility] DEVICE REMOTES [facility]  06/21/2024  3:40 PM [facility] DEVICE REMOTES [facility]  09/20/2024  3:40 PM [facility] DEVICE REMOTES [facility]  12/20/2024  3:40 PM [facility] DEVICE REMOTES [facility]  03/21/2025  3:40 PM [facility] DEVICE REMOTES [facility]  06/20/2025  3:40 PM [facility] DEVICE REMOTES [facility]  09/19/2025  3:40 PM [facility] DEVICE REMOTES [facility]

## 2023-08-07 ENCOUNTER — Other Ambulatory Visit (HOSPITAL_COMMUNITY): Payer: Self-pay | Admitting: Cardiology

## 2023-08-07 ENCOUNTER — Ambulatory Visit: Payer: Medicare Other | Attending: Cardiology | Admitting: Cardiology

## 2023-08-07 ENCOUNTER — Telehealth (HOSPITAL_COMMUNITY): Payer: Self-pay | Admitting: Cardiology

## 2023-08-07 ENCOUNTER — Encounter: Payer: Self-pay | Admitting: Cardiology

## 2023-08-07 VITALS — BP 121/80 | HR 80 | Ht 69.0 in | Wt 175.0 lb

## 2023-08-07 DIAGNOSIS — N184 Chronic kidney disease, stage 4 (severe): Secondary | ICD-10-CM | POA: Insufficient documentation

## 2023-08-07 DIAGNOSIS — I420 Dilated cardiomyopathy: Secondary | ICD-10-CM | POA: Insufficient documentation

## 2023-08-07 DIAGNOSIS — I5022 Chronic systolic (congestive) heart failure: Secondary | ICD-10-CM

## 2023-08-07 DIAGNOSIS — I129 Hypertensive chronic kidney disease with stage 1 through stage 4 chronic kidney disease, or unspecified chronic kidney disease: Secondary | ICD-10-CM | POA: Insufficient documentation

## 2023-08-07 DIAGNOSIS — E782 Mixed hyperlipidemia: Secondary | ICD-10-CM | POA: Diagnosis present

## 2023-08-07 DIAGNOSIS — I2510 Atherosclerotic heart disease of native coronary artery without angina pectoris: Secondary | ICD-10-CM | POA: Diagnosis not present

## 2023-08-07 LAB — ECHOCARDIOGRAM COMPLETE
Calc EF: 18 %
MV M vel: 5.04 m/s
MV Peak grad: 101.6 mm[Hg]
Radius: 1 cm
S' Lateral: 6.5 cm
Single Plane A2C EF: 21.8 %
Single Plane A4C EF: 17 %

## 2023-08-07 NOTE — Progress Notes (Signed)
Cardiology Office Note:    Date:  08/07/2023   ID:  Tommy Ward, DOB May 06, 1941, MRN [id number]  PCP:  Buckner Malta, MD  Cardiologist:  Garwin Brothers, MD   Referring MD: Buckner Malta, MD    ASSESSMENT:    1. Dilated cardiomyopathy (HCC)   2. Coronary arteriosclerosis   3. Chronic systolic heart failure (HCC)   4. 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 Mixed hyperlipidemia    PLAN:    In order of problems listed above:  Coronary artery disease: Secondary prevention stressed with the patient.  Importance of compliance with diet medication stressed and he vocalized understanding.  He was advised to ambulate to the best of his ability.  He has significant issues with neuropathy. Congestive heart failure with advanced cardiomyopathy: Stable at this time.  CHF education, salt intake issues and diet emphasized and he is already overweight.  He is also followed by electrophysiology and his congestive heart failure clinic Essential hypertension: Blood pressure stable and diet was emphasized. Mixed dyslipidemia: On lipid-lowering medications followed by primary care. He will need deficiency: Stable and I discussed this with him.  Caution advised. Patient will be seen in follow-up appointment in 6 months or earlier if the patient has any concerns.    Medication Adjustments/Labs and Tests Ordered: Current medicines are reviewed at length with the patient today.  Concerns regarding medicines are outlined above.  No orders of the defined types were placed in this encounter.  No orders of the defined types were placed in this encounter.    Chief Complaint  Patient presents with   Follow-up     History of Present Illness:    Tommy Ward is a 82 y.o. male.  Patient has past medical history of coronary artery disease, congestive heart failure severely depressed ejection fraction, essential hypertension, dyslipidemia, renal  insufficiency.  He is post defibrillator insertion.  He denies any problems at this time.  He is BiV paced.  He is very particular about his diet and checks his weight on a regular basis.  He is also followed by congestive heart failure specialist colleagues.  At the time of my evaluation, the patient is alert awake oriented and in no distress.  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) 05/20/2019   CAD (coronary artery disease) 11/23/2020   Cancer (HCC) 08/2008   prostate ca   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   Cataract 11/2013   bilateral   Chronic renal insufficiency, stage III (moderate) (HCC) 11/27/2015   Chronic systolic heart failure (HCC)    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 02/24/2019   Coronary arteriosclerosis 07/06/2012   Decreased cardiac ejection fraction 05/20/2019   Depressed left ventricular ejection fraction 12/05/2020   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   Essential hypertension 07/06/2012   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   Gastroesophageal reflux disease without esophagitis 03/22/2018   GERD (gastroesophageal reflux disease)    HFrEF (heart failure with reduced ejection fraction) (HCC) 11/23/2020   Hypercalcemia of malignancy 02/16/2021   Hyperkalemia 08/24/2019   Hyperlipidemia  Hyperphosphatemia 11/25/2019   Hypertension    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   Left bundle branch block 07/06/2012   Malignant neoplasm of prostate (HCC)    Malignant neoplasm of prostate (HCC)    Metabolic bone  disease 08/24/2019   Mixed hyperlipidemia 07/06/2012   Nonrheumatic mitral valve regurgitation 12/05/2020   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   Olecranon bursitis of left elbow 06/25/2018   Pre-diabetes 12/05/2020   Pulmonary hypertension (HCC) 12/05/2020   S/P radiation therapy > 12 wks ago 2010   prostate CA   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HCC)    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HCC) 12/05/2020   Tobacco use 12/05/2020   Vitamin D deficiency 08/24/2019   Vitamin D toxicity 07/24/2022    Past Surgical History:  Procedure Laterality Date   BIV ICD INSERTION CRT-D N/A 05/07/2021   Procedure: BIV ICD INSERTION CRT-D;  Surgeon: Lanier Prude, MD;  Location: [facility] INVASIVE CV LAB;  Service: Cardiovascular;  Laterality: N/A;   CHOLECYSTECTOMY  01/2003   lap chole   left hip repaired     RIGHT/LEFT HEART CATH AND CORONARY ANGIOGRAPHY N/A 12/08/2020   Procedure: RIGHT/LEFT HEART CATH AND CORONARY ANGIOGRAPHY;  Surgeon: Iran Ouch, MD;  Location: [facility] INVASIVE CV LAB;  Service: Cardiovascular;  Laterality: N/A;   TEE WITHOUT CARDIOVERSION N/A 01/26/2021   Procedure: TRANSESOPHAGEAL ECHOCARDIOGRAM (TEE);  Surgeon: Laurey Morale, MD;  Location: [facility] ENDOSCOPY;  Service: Cardiovascular;  Laterality: N/A;    Current Medications: No outpatient medications have been marked as taking for the 08/07/23 encounter (Office Visit) with Quorra Rosene, Aundra Dubin, MD.     Allergies:   Ezetimibe, Gabapentin, and Statins   Social History   Socioeconomic History   Marital status: Married    Spouse name: Not on file   Number of children: Not on file   Years of education: Not on file   Highest education level: Not on file  Occupational History   Not on file  Tobacco Use   Smoking status: Every Day    Current packs/day: 0.50    Average packs/day: 0.5 packs/day for 50.0 years (25.0 ttl pk-yrs)    Types: Cigarettes   Smokeless tobacco: Never  Vaping Use    Vaping status: Never Used  Substance and Sexual Activity   Alcohol use: Not Currently   Drug use: Never   Sexual activity: Not on file  Other Topics Concern   Not on file  Social History Narrative   Not on file   Social Drivers of Health   Financial Resource Strain: Low Risk  (05/22/2022)   Overall Financial Resource Strain (CARDIA)    Difficulty of Paying Living Expenses: Not very hard  Food Insecurity: No Food Insecurity (05/22/2022)   Hunger Vital Sign    Worried About Running Out of Food in the Last Year: Never true    Ran Out of Food in the Last Year: Never true  Transportation Needs: No Transportation Needs (05/22/2022)   PRAPARE - Administrator, Civil Service (Medical): No    Lack of Transportation (Non-Medical): No  Physical Activity: Not on file  Stress: Not on file  Social Connections: Not on file     Family History: The patient's family history includes Heart attack in his brother; Leukemia in his mother; Prostate cancer in his father. There is no history of Colon cancer, Rectal cancer, Stomach cancer, or Esophageal cancer.  ROS:   Please see the history  of present illness.    All other systems reviewed and are negative.  EKGs/Labs/Other Studies Reviewed:    The following studies were reviewed today: I discussed my findings with the patient at length    Recent Labs: 01/27/2023: ALT 12; Hemoglobin 13.8; Platelet Count 228 08/04/2023: B Natriuretic Peptide 1,327.6; BUN 42; Creatinine, Ser 2.19; Potassium 4.2; Sodium 138  Recent Lipid Panel    Component Value Date/Time   CHOL 113 08/04/2023 1411   CHOL 136 10/07/2022 0849   TRIG 246 (H) 08/04/2023 1411   HDL 50 08/04/2023 1411   HDL 55 10/07/2022 0849   CHOLHDL 2.3 08/04/2023 1411   VLDL 49 (H) 08/04/2023 1411   LDLCALC 14 08/04/2023 1411   LDLCALC 46 10/07/2022 0849    Physical Exam:    VS:  BP 121/80 (BP Location: Right Arm, Patient Position: Sitting, Cuff Size: Normal)   Pulse 80   Ht  5\' 9"  (1.753 m)   Wt 175 lb (79.4 kg)   SpO2 97%   BMI 25.84 kg/m     Wt Readings from Last 3 Encounters:  08/07/23 175 lb (79.4 kg)  08/06/23 175 lb 3.2 oz (79.5 kg)  08/04/23 175 lb 3.2 oz (79.5 kg)     GEN: Patient is in no acute distress HEENT: Normal NECK: No JVD; No carotid bruits LYMPHATICS: No lymphadenopathy CARDIAC: Hear sounds regular, 2/6 systolic murmur at the apex. RESPIRATORY:  Clear to auscultation without rales, wheezing or rhonchi  ABDOMEN: Soft, non-tender, non-distended MUSCULOSKELETAL:  No edema; No deformity  SKIN: Warm and dry NEUROLOGIC:  Alert and oriented x 3 PSYCHIATRIC:  Normal affect   Signed, Garwin Brothers, MD  08/07/2023 2:33 PM    [facility]

## 2023-08-07 NOTE — Patient Instructions (Signed)

## 2023-08-08 LAB — DIGOXIN LEVEL: Digoxin, Serum: 0.6 ng/mL (ref 0.5–0.9)

## 2023-08-12 ENCOUNTER — Other Ambulatory Visit: Payer: Self-pay

## 2023-08-14 ENCOUNTER — Other Ambulatory Visit (HOSPITAL_COMMUNITY): Payer: Self-pay

## 2023-08-14 ENCOUNTER — Telehealth (HOSPITAL_COMMUNITY): Payer: Self-pay | Admitting: Emergency Medicine

## 2023-08-14 NOTE — Telephone Encounter (Signed)
Will you please send a prior auth to:  [facility] -  [address] [place] [address] Phone: [phone]  Fax: [phone]   Reference Humza's repatha prescription.   Thank you,  Benson Setting EMT-P Community Paramedic  [phone]

## 2023-08-18 ENCOUNTER — Telehealth: Payer: Self-pay | Admitting: Pharmacy Technician

## 2023-08-18 ENCOUNTER — Other Ambulatory Visit (HOSPITAL_COMMUNITY): Payer: Self-pay

## 2023-08-18 NOTE — Telephone Encounter (Signed)
Pharmacy Patient Advocate Encounter  Received notification from [facility] that Prior Authorization for repatha has been APPROVED from 08/18/23 to 08/17/24. Ran test claim, Copay is $133.16-one month (GAP). This test claim was processed through [facility]- copay amounts may vary at other pharmacies due to pharmacy/plan contracts, or as the patient moves through the different stages of their insurance plan.   PA #/Case ID/Reference #:  [id number]

## 2023-08-18 NOTE — Telephone Encounter (Signed)
Staff message sent to PA Rx Team.  Per chart review, Rx was sent to mail order pharmacy 12/2022 with refills. No refills should be needed at this time.

## 2023-08-18 NOTE — Telephone Encounter (Signed)
Pharmacy Patient Advocate Encounter   Received notification from Physician's Office that prior authorization for repatha is required/requested.   Insurance verification completed.   The patient is insured through [facility] .   Per test claim: PA required; PA submitted to above mentioned insurance via CoverMyMeds Key/confirmation #/EOC [id number] Status is pending

## 2023-08-18 NOTE — Telephone Encounter (Signed)
-----   Message from Nurse Eileen Stanford E sent at 08/18/2023  8:00 AM EST ----- Regarding: PA for Repatha Received a notice that a PA for Repatha was needed. Thanks!

## 2023-08-19 ENCOUNTER — Telehealth (HOSPITAL_COMMUNITY): Payer: Self-pay | Admitting: Emergency Medicine

## 2023-08-19 ENCOUNTER — Encounter: Payer: Self-pay | Admitting: Cardiology

## 2023-08-19 ENCOUNTER — Other Ambulatory Visit (HOSPITAL_COMMUNITY): Payer: Self-pay

## 2023-08-19 ENCOUNTER — Telehealth (HOSPITAL_COMMUNITY): Payer: Self-pay

## 2023-08-19 ENCOUNTER — Encounter: Payer: Self-pay | Admitting: Oncology

## 2023-08-19 ENCOUNTER — Other Ambulatory Visit: Payer: Self-pay

## 2023-08-19 MED ORDER — OMEPRAZOLE 40 MG PO CPDR
40.0000 mg | DELAYED_RELEASE_CAPSULE | Freq: Every day | ORAL | 1 refills | Status: DC
  Filled 2023-08-19: qty 90, 90d supply, fill #0

## 2023-08-19 NOTE — Telephone Encounter (Signed)
 Advanced Heart Failure Patient Advocate Encounter  The patient was approved for a [employer] grant that will help cover the cost of Digoxin , Farxiga , Metoprolol , Spironolactone .  Total amount awarded, $10,000.  Effective: 07/20/2023 - 07/18/2024.  BIN [id number] PCN [id number] [id number] ID [id number]  Approval and processing information updated in [facility]. Patient informed via paramedicine.  Rachel DEL, CPhT Rx Patient Advocate Phone: [phone]

## 2023-08-19 NOTE — Telephone Encounter (Signed)
 HF Paramedicine Message to Advanced Heart Failure Clinic  Pharmacy (if applicable): [facility] pharmacy    Issue/reason for call: Mr. Chartrand has reviewed the information about the Barostim and has elected to proceed with implantation of same.   Medication refill? N/A  Blood pressure? 118/64  Weight gain? (How much over a week?) None Weight loss? (How much over a week?) None  Edema ? (Baseline or worse?) Baseline  Dyspnea?  (Baseline or worse?) Baseline   Medications are taken as prescribed? yes   Lauraine Haff EMT-P Community Paramedic  [phone]

## 2023-08-19 NOTE — Telephone Encounter (Signed)
 I contacted Dr. Martina office and left a message with Lyle, triage nurse regarding sending prescription for Omeprazole  over to [facility] for refill.   Will have H. Spencer follow up.   Lauraine Haff EMT-P Community Paramedic  [phone]

## 2023-08-27 ENCOUNTER — Other Ambulatory Visit (HOSPITAL_COMMUNITY): Payer: Self-pay

## 2023-08-27 ENCOUNTER — Other Ambulatory Visit: Payer: Self-pay

## 2023-08-27 NOTE — Progress Notes (Signed)
 Paramedicine Encounter    Patient ID: Tommy Ward, male    DOB: 1941-04-01, 83 y.o.   MRN: [id number]   Complaints- chronic foot pain, upset stomach after eating out yesterday, soft stools today   Assessment- CAOX4, warm and dry, ambulatory with no shortness of breath, no lower leg swelling, lungs clear, vitals at baseline.   Compliance with meds- missed two torsemide  pills on Saturday.   Pill box filled- for one week   Refills needed- farxiga    Meds changes since last visit- none     Social changes- none    VISIT SUMMARY**  Arrived for home visit for [facility] who reports to be feeling okay aside from some chronic foot pain. He reports having some GI upset today after eating what he believes was some bad chicken. He states his stool is softer than usual. He denied any chest pain, dizziness or shortness of breath. No lower leg swelling. I obtained vitals as noted and they are at baseline for him. I reviewed medications and confirmed same filled pill box for one week. We reviewed refills. He needs Farxiga - which I ordered from [facility] pharmacy. He also needs Repatha  refilled. I will call about same. Appointments confirmed and home visit complete. I will see Ambers in one week.    BP 106/70   Pulse 75   Resp 16   Wt 176 lb 6.4 oz (80 kg)   SpO2 96%   BMI 26.05 kg/m  Weight yesterday-- didn't weigh Last visit weight-- 174lbs      ACTION: Home visit completed     Patient Care Team: Clemmie Nest, MD as PCP - General (Family Medicine) Mona, Vinie BROCKS, MD as PCP - Cardiology (Cardiology) Cindie Ole DASEN, MD as PCP - Electrophysiology (Cardiology) Ezzard Valaria LABOR, MD as Consulting Physician (Oncology) Matilde Lamar PARAS, DDS as Referring Physician (Oral Surgery) Marda General, MD as Consulting Physician (Urology)  Patient Active Problem List   Diagnosis Date Noted   Vitamin D toxicity 07/24/2022   Anemia due to stage 4 chronic kidney disease (HCC) 03/20/2022    Hypercalcemia of malignancy 02/16/2021   Chronic systolic heart failure (HCC)    Abnormal stress test 12/05/2020   Depressed left ventricular ejection fraction 12/05/2020   Nonrheumatic mitral valve regurgitation 12/05/2020   Pre-diabetes 12/05/2020   Pulmonary hypertension (HCC) 12/05/2020   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HCC) 12/05/2020   Tobacco use 12/05/2020   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   HFrEF (heart failure with reduced ejection fraction) (HCC) 11/23/2020   CAD (coronary artery disease) 11/23/2020   GERD (gastroesophageal reflux disease)    Hyperlipidemia    Hypertension    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   Malignant neoplasm of prostate (HCC) 06/02/2020   AKI (acute kidney injury) (HCC) 11/25/2019   Hyperphosphatemia 11/25/2019   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   Hyperkalemia 08/24/2019   Metabolic bone disease 08/24/2019   Vitamin D deficiency 08/24/2019   Benign hypertension with chronic kidney disease, stage III (HCC) 05/20/2019   Decreased cardiac ejection fraction 05/20/2019   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) 05/20/2019   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 02/24/2019   Olecranon bursitis of left elbow 06/25/2018   B12 deficiency 03/22/2018   Gastroesophageal reflux disease without esophagitis 03/22/2018   Chronic renal insufficiency, stage III (moderate) (HCC) 11/27/2015   Aortic valve disorder 07/08/2014  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   Cataract 11/2013   Essential hypertension 07/06/2012   Coronary arteriosclerosis 07/06/2012   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   Left bundle branch block 07/06/2012   Mixed hyperlipidemia 07/06/2012   Cancer (HCC) 08/2008   S/P radiation therapy > 12 wks ago 2010     Current Outpatient Medications:    aspirin  EC 81 MG tablet, Take 1 tablet (81 mg total) by mouth daily., Disp: 90 tablet, Rfl: 3   Cetirizine HCl 10 MG CAPS, Take 1 capsule by mouth daily., Disp: , Rfl:    cholestyramine  light (PREVALITE ) 4 g packet, Take 4 g by mouth 2 (two) times daily as needed (cholesterol)., Disp: , Rfl:    dapagliflozin  propanediol (FARXIGA ) 10 MG TABS tablet, Take 1 tablet (10 mg total) by mouth daily before breakfast., Disp: 90 tablet, Rfl: 3   digoxin  (LANOXIN ) 0.125 MG tablet, Take 0.5 tablets (0.0625 mg total) by mouth daily., Disp: 15 tablet, Rfl: 5   enzalutamide  (XTANDI ) 40 MG tablet, Take 160 mg by mouth daily., Disp: , Rfl:    Evolocumab  (REPATHA  SURECLICK) 140 MG/ML SOAJ, INJECT 1 DOSE UNDER THE SKIN EVERY 14 DAYS, Disp: 6 mL, Rfl: 3   FISH OIL-KRILL OIL PO, Take 500 mg by mouth daily., Disp: , Rfl:    metolazone  (ZAROXOLYN ) 2.5 MG tablet, Take 1 tablet (2.5 mg total) by mouth as directed. Only take as directed by the advanced heart failure clinic, Disp: 10 tablet, Rfl: 0   metoprolol  succinate (TOPROL  XL) 25 MG 24 hr tablet, Take 1 tablet (25 mg total) by mouth at bedtime., Disp: 90 tablet, Rfl: 3   Multiple Vitamins-Minerals (PRESERVISION/LUTEIN) CAPS, Take 1 capsule by mouth at bedtime., Disp: , Rfl:    omeprazole  (PRILOSEC) 40 MG capsule, Take 40 mg by mouth daily., Disp: , Rfl:    omeprazole  (PRILOSEC) 40 MG capsule, Take 1 capsule (40 mg total) by mouth daily., Disp: 90 capsule, Rfl: 1   potassium chloride  SA (KLOR-CON  M) 20 MEQ tablet, Take 1 tablet (20 mEq total) by mouth daily., Disp: 30 tablet, Rfl: 3   pregabalin  (LYRICA ) 100 MG capsule, Take 100 mg by mouth 3 (three) times daily., Disp: , Rfl:    rosuvastatin  (CRESTOR ) 5 MG tablet, Take 1 tablet (5 mg total) by mouth daily., Disp: 90 tablet, Rfl: 3   spironolactone  (ALDACTONE ) 25 MG tablet, Take 1 tablet (25 mg total) by mouth daily., Disp: 90 tablet, Rfl: 1   tamsulosin  (FLOMAX ) 0.4 MG CAPS  capsule, Take 1 capsule (0.4 mg total) by mouth daily., Disp: 90 capsule, Rfl: 0   torsemide  (DEMADEX ) 20 MG tablet, Take 4 tablets (80 mg total) by mouth in the morning AND 2 tablets (40 mg total) every evening., Disp: 180 tablet, Rfl: 6   traZODone  (DESYREL ) 100 MG tablet, Take 25 mg by mouth at bedtime as needed for sleep., Disp: , Rfl:  Allergies  Allergen Reactions   Ezetimibe  Other (See Comments)    Myalgia   Gabapentin Itching and Other (See Comments)    Sore throat, breakouts   Statins Other (See Comments)    Myalgias (intolerance)     Social History   Socioeconomic History   Marital status: Married    Spouse name: Not on file   Number of children: Not on file   Years of education: Not on file   Highest education level: Not on file  Occupational History   Not on file  Tobacco Use  Smoking status: Every Day    Current packs/day: 0.50    Average packs/day: 0.5 packs/day for 50.0 years (25.0 ttl pk-yrs)    Types: Cigarettes   Smokeless tobacco: Never  Vaping Use   Vaping status: Never Used  Substance and Sexual Activity   Alcohol  use: Not Currently   Drug use: Never   Sexual activity: Not on file  Other Topics Concern   Not on file  Social History Narrative   Not on file   Social Drivers of Health   Financial Resource Strain: Low Risk  (05/22/2022)   Overall Financial Resource Strain (CARDIA)    Difficulty of Paying Living Expenses: Not very hard  Food Insecurity: No Food Insecurity (05/22/2022)   Hunger Vital Sign    Worried About Running Out of Food in the Last Year: Never true    Ran Out of Food in the Last Year: Never true  Transportation Needs: No Transportation Needs (05/22/2022)   PRAPARE - Administrator, Civil Service (Medical): No    Lack of Transportation (Non-Medical): No  Physical Activity: Not on file  Stress: Not on file  Social Connections: Not on file  Intimate Partner Violence: Not on file    Physical Exam      Future  Appointments  Date Time Provider Department Center  09/22/2023  3:40 PM [facility] DEVICE REMOTES [facility]  10/07/2023 11:15 AM [place] Ethan CROME, DPM [facility]  10/08/2023 11:20 AM Percy, Rosaline HERO, NP [facility]  10/13/2023 10:45 AM [facility] None  10/14/2023 10:30 AM Ezzard Valaria LABOR, MD [facility] None  11/03/2023 11:00 AM [facility] PA/NP [facility] None  12/22/2023  3:40 PM [facility] DEVICE REMOTES [facility]  03/22/2024  3:40 PM [facility] DEVICE REMOTES [facility]  06/21/2024  3:40 PM [facility] DEVICE REMOTES [facility]  09/20/2024  3:40 PM [facility] DEVICE REMOTES [facility]  12/20/2024  3:40 PM [facility] DEVICE REMOTES [facility]  03/21/2025  3:40 PM [facility] DEVICE REMOTES [facility]  06/20/2025  3:40 PM [facility] DEVICE REMOTES [facility]  09/19/2025  3:40 PM [facility] DEVICE REMOTES [facility]

## 2023-08-30 ENCOUNTER — Emergency Department (HOSPITAL_COMMUNITY): Payer: Medicare Other

## 2023-08-30 ENCOUNTER — Encounter (HOSPITAL_COMMUNITY): Payer: Self-pay

## 2023-08-30 ENCOUNTER — Other Ambulatory Visit: Payer: Self-pay

## 2023-08-30 DIAGNOSIS — K7200 Acute and subacute hepatic failure without coma: Secondary | ICD-10-CM

## 2023-08-30 DIAGNOSIS — D72829 Elevated white blood cell count, unspecified: Secondary | ICD-10-CM | POA: Diagnosis not present

## 2023-08-30 DIAGNOSIS — R063 Periodic breathing: Secondary | ICD-10-CM | POA: Diagnosis not present

## 2023-08-30 DIAGNOSIS — Z923 Personal history of irradiation: Secondary | ICD-10-CM

## 2023-08-30 DIAGNOSIS — Z1152 Encounter for screening for COVID-19: Secondary | ICD-10-CM

## 2023-08-30 DIAGNOSIS — C61 Malignant neoplasm of prostate: Secondary | ICD-10-CM | POA: Diagnosis present

## 2023-08-30 DIAGNOSIS — I5043 Acute on chronic combined systolic (congestive) and diastolic (congestive) heart failure: Secondary | ICD-10-CM

## 2023-08-30 DIAGNOSIS — I5023 Acute on chronic systolic (congestive) heart failure: Secondary | ICD-10-CM | POA: Diagnosis present

## 2023-08-30 DIAGNOSIS — Z7982 Long term (current) use of aspirin: Secondary | ICD-10-CM

## 2023-08-30 DIAGNOSIS — D631 Anemia in chronic kidney disease: Secondary | ICD-10-CM | POA: Diagnosis present

## 2023-08-30 DIAGNOSIS — D6959 Other secondary thrombocytopenia: Secondary | ICD-10-CM | POA: Diagnosis present

## 2023-08-30 DIAGNOSIS — C7951 Secondary malignant neoplasm of bone: Secondary | ICD-10-CM | POA: Diagnosis present

## 2023-08-30 DIAGNOSIS — Z8042 Family history of malignant neoplasm of prostate: Secondary | ICD-10-CM

## 2023-08-30 DIAGNOSIS — R7303 Prediabetes: Secondary | ICD-10-CM | POA: Diagnosis present

## 2023-08-30 DIAGNOSIS — I214 Non-ST elevation (NSTEMI) myocardial infarction: Principal | ICD-10-CM

## 2023-08-30 DIAGNOSIS — K219 Gastro-esophageal reflux disease without esophagitis: Secondary | ICD-10-CM | POA: Diagnosis present

## 2023-08-30 DIAGNOSIS — M898X9 Other specified disorders of bone, unspecified site: Secondary | ICD-10-CM | POA: Diagnosis present

## 2023-08-30 DIAGNOSIS — I509 Heart failure, unspecified: Secondary | ICD-10-CM

## 2023-08-30 DIAGNOSIS — D689 Coagulation defect, unspecified: Secondary | ICD-10-CM | POA: Diagnosis not present

## 2023-08-30 DIAGNOSIS — R570 Cardiogenic shock: Secondary | ICD-10-CM | POA: Diagnosis not present

## 2023-08-30 DIAGNOSIS — I10 Essential (primary) hypertension: Secondary | ICD-10-CM | POA: Diagnosis present

## 2023-08-30 DIAGNOSIS — N179 Acute kidney failure, unspecified: Secondary | ICD-10-CM | POA: Diagnosis present

## 2023-08-30 DIAGNOSIS — I420 Dilated cardiomyopathy: Secondary | ICD-10-CM | POA: Diagnosis present

## 2023-08-30 DIAGNOSIS — Z7984 Long term (current) use of oral hypoglycemic drugs: Secondary | ICD-10-CM

## 2023-08-30 DIAGNOSIS — I4891 Unspecified atrial fibrillation: Secondary | ICD-10-CM | POA: Diagnosis not present

## 2023-08-30 DIAGNOSIS — I083 Combined rheumatic disorders of mitral, aortic and tricuspid valves: Secondary | ICD-10-CM | POA: Diagnosis present

## 2023-08-30 DIAGNOSIS — I255 Ischemic cardiomyopathy: Secondary | ICD-10-CM | POA: Diagnosis present

## 2023-08-30 DIAGNOSIS — Z95810 Presence of automatic (implantable) cardiac defibrillator: Secondary | ICD-10-CM

## 2023-08-30 DIAGNOSIS — E872 Acidosis, unspecified: Secondary | ICD-10-CM | POA: Diagnosis present

## 2023-08-30 DIAGNOSIS — I130 Hypertensive heart and chronic kidney disease with heart failure and stage 1 through stage 4 chronic kidney disease, or unspecified chronic kidney disease: Secondary | ICD-10-CM | POA: Diagnosis present

## 2023-08-30 DIAGNOSIS — D509 Iron deficiency anemia, unspecified: Secondary | ICD-10-CM | POA: Diagnosis not present

## 2023-08-30 DIAGNOSIS — I272 Pulmonary hypertension, unspecified: Secondary | ICD-10-CM | POA: Diagnosis present

## 2023-08-30 DIAGNOSIS — I21A1 Myocardial infarction type 2: Secondary | ICD-10-CM | POA: Diagnosis present

## 2023-08-30 DIAGNOSIS — Z79899 Other long term (current) drug therapy: Secondary | ICD-10-CM

## 2023-08-30 DIAGNOSIS — Z7189 Other specified counseling: Secondary | ICD-10-CM | POA: Diagnosis not present

## 2023-08-30 DIAGNOSIS — I2510 Atherosclerotic heart disease of native coronary artery without angina pectoris: Secondary | ICD-10-CM | POA: Diagnosis present

## 2023-08-30 DIAGNOSIS — R188 Other ascites: Secondary | ICD-10-CM | POA: Diagnosis present

## 2023-08-30 DIAGNOSIS — N184 Chronic kidney disease, stage 4 (severe): Secondary | ICD-10-CM | POA: Diagnosis present

## 2023-08-30 DIAGNOSIS — Z66 Do not resuscitate: Secondary | ICD-10-CM | POA: Diagnosis not present

## 2023-08-30 DIAGNOSIS — F17210 Nicotine dependence, cigarettes, uncomplicated: Secondary | ICD-10-CM | POA: Diagnosis present

## 2023-08-30 DIAGNOSIS — Z806 Family history of leukemia: Secondary | ICD-10-CM

## 2023-08-30 DIAGNOSIS — Z888 Allergy status to other drugs, medicaments and biological substances status: Secondary | ICD-10-CM

## 2023-08-30 DIAGNOSIS — R7989 Other specified abnormal findings of blood chemistry: Secondary | ICD-10-CM | POA: Diagnosis not present

## 2023-08-30 DIAGNOSIS — K761 Chronic passive congestion of liver: Secondary | ICD-10-CM | POA: Diagnosis present

## 2023-08-30 DIAGNOSIS — R110 Nausea: Secondary | ICD-10-CM | POA: Diagnosis not present

## 2023-08-30 DIAGNOSIS — E869 Volume depletion, unspecified: Secondary | ICD-10-CM | POA: Diagnosis present

## 2023-08-30 DIAGNOSIS — K760 Fatty (change of) liver, not elsewhere classified: Secondary | ICD-10-CM | POA: Diagnosis not present

## 2023-08-30 DIAGNOSIS — Z8546 Personal history of malignant neoplasm of prostate: Secondary | ICD-10-CM

## 2023-08-30 DIAGNOSIS — R0602 Shortness of breath: Secondary | ICD-10-CM | POA: Diagnosis present

## 2023-08-30 DIAGNOSIS — Z8249 Family history of ischemic heart disease and other diseases of the circulatory system: Secondary | ICD-10-CM

## 2023-08-30 DIAGNOSIS — Z515 Encounter for palliative care: Secondary | ICD-10-CM | POA: Diagnosis not present

## 2023-08-30 DIAGNOSIS — Z9049 Acquired absence of other specified parts of digestive tract: Secondary | ICD-10-CM

## 2023-08-30 DIAGNOSIS — Z8711 Personal history of peptic ulcer disease: Secondary | ICD-10-CM

## 2023-08-30 DIAGNOSIS — E875 Hyperkalemia: Secondary | ICD-10-CM | POA: Diagnosis present

## 2023-08-30 DIAGNOSIS — E7849 Other hyperlipidemia: Secondary | ICD-10-CM | POA: Diagnosis present

## 2023-08-30 LAB — CBC WITH DIFFERENTIAL/PLATELET
Abs Immature Granulocytes: 0.12 10*3/uL — ABNORMAL HIGH (ref 0.00–0.07)
Basophils Absolute: 0 10*3/uL (ref 0.0–0.1)
Basophils Relative: 0 %
Eosinophils Absolute: 0 10*3/uL (ref 0.0–0.5)
Eosinophils Relative: 0 %
HCT: 37.7 % — ABNORMAL LOW (ref 39.0–52.0)
Hemoglobin: 11.5 g/dL — ABNORMAL LOW (ref 13.0–17.0)
Immature Granulocytes: 1 %
Lymphocytes Relative: 6 %
Lymphs Abs: 0.7 10*3/uL (ref 0.7–4.0)
MCH: 28.3 pg (ref 26.0–34.0)
MCHC: 30.5 g/dL (ref 30.0–36.0)
MCV: 92.6 fL (ref 80.0–100.0)
Monocytes Absolute: 1.7 10*3/uL — ABNORMAL HIGH (ref 0.1–1.0)
Monocytes Relative: 15 %
Neutro Abs: 9.1 10*3/uL — ABNORMAL HIGH (ref 1.7–7.7)
Neutrophils Relative %: 78 %
Platelets: 166 10*3/uL (ref 150–400)
RBC: 4.07 MIL/uL — ABNORMAL LOW (ref 4.22–5.81)
RDW: 18 % — ABNORMAL HIGH (ref 11.5–15.5)
WBC: 11.5 10*3/uL — ABNORMAL HIGH (ref 4.0–10.5)
nRBC: 1.6 % — ABNORMAL HIGH (ref 0.0–0.2)

## 2023-08-30 LAB — COMPREHENSIVE METABOLIC PANEL
ALT: 2873 U/L — ABNORMAL HIGH (ref 0–44)
AST: 3591 U/L — ABNORMAL HIGH (ref 15–41)
Albumin: 3.5 g/dL (ref 3.5–5.0)
Alkaline Phosphatase: 147 U/L — ABNORMAL HIGH (ref 38–126)
Anion gap: 23 — ABNORMAL HIGH (ref 5–15)
BUN: 77 mg/dL — ABNORMAL HIGH (ref 8–23)
CO2: 16 mmol/L — ABNORMAL LOW (ref 22–32)
Calcium: 8.6 mg/dL — ABNORMAL LOW (ref 8.9–10.3)
Chloride: 96 mmol/L — ABNORMAL LOW (ref 98–111)
Creatinine, Ser: 4.1 mg/dL — ABNORMAL HIGH (ref 0.61–1.24)
GFR, Estimated: 14 mL/min — ABNORMAL LOW (ref >60)
Glucose, Bld: 94 mg/dL (ref 70–99)
Potassium: 5.3 mmol/L — ABNORMAL HIGH (ref 3.5–5.1)
Sodium: 135 mmol/L (ref 135–145)
Total Bilirubin: 5.8 mg/dL — ABNORMAL HIGH (ref 0.0–1.2)
Total Protein: 6 g/dL — ABNORMAL LOW (ref 6.5–8.1)

## 2023-08-30 LAB — BLOOD GAS, VENOUS
Acid-base deficit: 9.9 mmol/L — ABNORMAL HIGH (ref 0.0–2.0)
Bicarbonate: 16.1 mmol/L — ABNORMAL LOW (ref 20.0–28.0)
Drawn by: 8323
O2 Saturation: 60.6 %
Patient temperature: 36.9
pCO2, Ven: 35 mm[Hg] — ABNORMAL LOW (ref 44–60)
pH, Ven: 7.27 (ref 7.25–7.43)
pO2, Ven: 42 mm[Hg] (ref 32–45)

## 2023-08-30 LAB — RESP PANEL BY RT-PCR (RSV, FLU A&B, COVID)  RVPGX2
Influenza A by PCR: NEGATIVE
Influenza B by PCR: NEGATIVE
Resp Syncytial Virus by PCR: NEGATIVE
SARS Coronavirus 2 by RT PCR: NEGATIVE

## 2023-08-30 LAB — HEPATITIS PANEL, ACUTE
HCV Ab: NONREACTIVE
Hep A IgM: NONREACTIVE
Hep B C IgM: NONREACTIVE
Hepatitis B Surface Ag: NONREACTIVE

## 2023-08-30 LAB — PROTIME-INR
INR: 2.4 — ABNORMAL HIGH (ref 0.8–1.2)
Prothrombin Time: 26.3 s — ABNORMAL HIGH (ref 11.4–15.2)

## 2023-08-30 LAB — AMMONIA: Ammonia: 29 umol/L (ref 9–35)

## 2023-08-30 LAB — LACTIC ACID, PLASMA
Lactic Acid, Venous: 8.2 mmol/L (ref 0.5–1.9)
Lactic Acid, Venous: 7.9 mmol/L (ref 0.5–1.9)

## 2023-08-30 LAB — ACETAMINOPHEN LEVEL: Acetaminophen (Tylenol), Serum: <10 ug/mL — ABNORMAL LOW (ref 10–30)

## 2023-08-30 LAB — HEPARIN LEVEL (UNFRACTIONATED): Heparin Unfractionated: 0.1 [IU]/mL — ABNORMAL LOW (ref 0.30–0.70)

## 2023-08-30 LAB — LIPASE, BLOOD: Lipase: 53 U/L — ABNORMAL HIGH (ref 11–51)

## 2023-08-30 LAB — BRAIN NATRIURETIC PEPTIDE: B Natriuretic Peptide: 3686.6 pg/mL — ABNORMAL HIGH (ref 0.0–100.0)

## 2023-08-30 LAB — DIGOXIN LEVEL: Digoxin Level: 1.5 ng/mL (ref 0.8–2.0)

## 2023-08-30 LAB — TROPONIN I (HIGH SENSITIVITY): Troponin I (High Sensitivity): 3487 ng/L (ref <18)

## 2023-08-30 MED ORDER — METOPROLOL SUCCINATE ER 25 MG PO TB24
25.0000 mg | ORAL_TABLET | Freq: Every day | ORAL | Status: DC
  Administered 2023-08-30 – 2023-08-31 (×2): 25 mg via ORAL
  Filled 2023-08-30 (×2): qty 1

## 2023-08-30 MED ORDER — FUROSEMIDE 10 MG/ML IJ SOLN
40.0000 mg | Freq: Once | INTRAMUSCULAR | Status: AC
  Administered 2023-08-30: 40 mg via INTRAVENOUS
  Filled 2023-08-30: qty 4

## 2023-08-30 MED ORDER — PANTOPRAZOLE SODIUM 40 MG PO TBEC
40.0000 mg | DELAYED_RELEASE_TABLET | Freq: Every day | ORAL | Status: DC
  Administered 2023-08-30 – 2023-09-05 (×7): 40 mg via ORAL
  Filled 2023-08-30 (×7): qty 1

## 2023-08-30 MED ORDER — SODIUM ZIRCONIUM CYCLOSILICATE 10 G PO PACK
10.0000 g | PACK | ORAL | Status: AC
  Administered 2023-08-30: 10 g via ORAL
  Filled 2023-08-30: qty 1

## 2023-08-30 MED ORDER — ASPIRIN 81 MG PO TBEC
81.0000 mg | DELAYED_RELEASE_TABLET | Freq: Every day | ORAL | Status: DC
  Administered 2023-08-30 – 2023-09-05 (×7): 81 mg via ORAL
  Filled 2023-08-30 (×7): qty 1

## 2023-08-30 MED ORDER — HEPARIN (PORCINE) 25000 UT/250ML-% IV SOLN
1450.0000 [IU]/h | INTRAVENOUS | Status: DC
  Administered 2023-08-30: 1100 [IU]/h via INTRAVENOUS
  Administered 2023-08-31: 1200 [IU]/h via INTRAVENOUS
  Administered 2023-09-01: 1450 [IU]/h via INTRAVENOUS
  Filled 2023-08-30 (×4): qty 250

## 2023-08-30 MED ORDER — SODIUM CHLORIDE 0.9 % IV BOLUS
500.0000 mL | Freq: Once | INTRAVENOUS | Status: AC
  Administered 2023-08-30: 500 mL via INTRAVENOUS

## 2023-08-30 MED ORDER — TAMSULOSIN HCL 0.4 MG PO CAPS
0.4000 mg | ORAL_CAPSULE | Freq: Every day | ORAL | Status: DC
Start: 2023-08-30 — End: 2023-09-05
  Administered 2023-08-30 – 2023-09-05 (×7): 0.4 mg via ORAL
  Filled 2023-08-30 (×7): qty 1

## 2023-08-30 MED ORDER — SODIUM BICARBONATE 8.4 % IV SOLN
50.0000 meq | INTRAVENOUS | Status: AC
  Administered 2023-08-30: 50 meq via INTRAVENOUS
  Filled 2023-08-30: qty 50

## 2023-08-30 MED ORDER — FUROSEMIDE 10 MG/ML IJ SOLN
60.0000 mg | Freq: Two times a day (BID) | INTRAMUSCULAR | Status: DC
  Administered 2023-08-30: 60 mg via INTRAVENOUS
  Filled 2023-08-30 (×2): qty 6

## 2023-08-30 MED ORDER — HEPARIN BOLUS VIA INFUSION
4000.0000 [IU] | Freq: Once | INTRAVENOUS | Status: AC
  Administered 2023-08-30: 4000 [IU] via INTRAVENOUS
  Filled 2023-08-30: qty 4000

## 2023-08-30 MED ORDER — DIGOXIN 125 MCG PO TABS
0.0625 mg | ORAL_TABLET | Freq: Every day | ORAL | Status: DC
Start: 2023-08-30 — End: 2023-08-30
  Administered 2023-08-30: 0.0625 mg via ORAL
  Filled 2023-08-30: qty 1

## 2023-08-30 NOTE — ED Triage Notes (Signed)
 Pt arrived via [facility] for [employer] . Pt SOB gets worse w/exertion. Pt c/o N/Vx2d. Pt is Eupneic.

## 2023-08-30 NOTE — ED Provider Notes (Signed)
 [facility] Provider Note   CSN: [id number] Arrival date & time: 08/30/23  1047     History  Chief Complaint  Patient presents with   Shortness of Breath    Tommy Ward is a 83 y.o. male.  83 yo M with a chief complaints of difficulty breathing.  This been going on for about 4 days.  Much worse with getting up and trying to ambulate.  Tells me that he only is able to make it a couple steps without becoming extremely short of breath.  He denies any chest pain or pressure with this.  Denies diaphoresis.  Denies dark stool or blood in his stool but tells me that his stool has smelled bad recently.  Denies abdominal pain.  Has not felt like eating and drinking for couple days.  He is not really sure why.  Nauseated but not vomiting.   Shortness of Breath      Home Medications Prior to Admission medications   Medication Sig Start Date End Date Taking? Authorizing Provider  aspirin  EC 81 MG tablet Take 1 tablet (81 mg total) by mouth daily. 09/20/19   Hilty, Vinie BROCKS, MD  Cetirizine HCl 10 MG CAPS Take 1 capsule by mouth daily.    [provider]  cholestyramine  light (PREVALITE ) 4 g packet Take 4 g by mouth 2 (two) times daily as needed (cholesterol).    [provider]  dapagliflozin  propanediol (FARXIGA ) 10 MG TABS tablet Take 1 tablet (10 mg total) by mouth daily before breakfast. 05/28/23   [place], Harlene HERO, FNP  digoxin  (LANOXIN ) 0.125 MG tablet Take 0.5 tablets (0.0625 mg total) by mouth daily. 07/02/23   Rolan Ezra RAMAN, MD  enzalutamide  (XTANDI ) 40 MG tablet Take 160 mg by mouth daily.    [provider]  Evolocumab  (REPATHA  SURECLICK) 140 MG/ML SOAJ INJECT 1 DOSE UNDER THE SKIN EVERY 14 DAYS 01/15/23   Hilty, Vinie BROCKS, MD  FISH OIL-KRILL OIL PO Take 500 mg by mouth daily.    [provider]  furosemide  (LASIX ) 20 MG tablet Take 20 mg by mouth daily. 08/29/23   [provider]   metolazone  (ZAROXOLYN ) 2.5 MG tablet Take 1 tablet (2.5 mg total) by mouth as directed. Only take as directed by the advanced heart failure clinic 06/05/23 09/03/23  [place]a Harlene HERO, FNP  metoprolol  succinate (TOPROL  XL) 25 MG 24 hr tablet Take 1 tablet (25 mg total) by mouth at bedtime. 06/05/23   [place], Harlene HERO, FNP  Multiple Vitamins-Minerals (PRESERVISION/LUTEIN) CAPS Take 1 capsule by mouth at bedtime.    [provider]  omeprazole  (PRILOSEC) 40 MG capsule Take 40 mg by mouth daily. 12/11/21   [provider]  omeprazole  (PRILOSEC) 40 MG capsule Take 1 capsule (40 mg total) by mouth daily. 08/19/23     ondansetron  (ZOFRAN -ODT) 4 MG disintegrating tablet Take 4 mg by mouth every 8 (eight) hours as needed. 08/29/23   [provider]  potassium chloride  SA (KLOR-CON  M) 20 MEQ tablet Take 1 tablet (20 mEq total) by mouth daily. 05/28/23   [place], Harlene HERO, FNP  pregabalin  (LYRICA ) 100 MG capsule Take 100 mg by mouth 3 (three) times daily.    [provider]  rosuvastatin  (CRESTOR ) 5 MG tablet Take 1 tablet (5 mg total) by mouth daily. 05/28/23   [place], Harlene HERO, FNP  spironolactone  (ALDACTONE ) 25 MG tablet Take 1 tablet (25 mg total) by mouth daily. 05/28/23  [place], [place], FNP  tamsulosin  (FLOMAX ) 0.4 MG CAPS capsule Take 1 capsule (0.4 mg total) by mouth daily. 07/02/23   Rolan Ezra RAMAN, MD  torsemide  (DEMADEX ) 20 MG tablet Take 4 tablets (80 mg total) by mouth in the morning AND 2 tablets (40 mg total) every evening. 05/28/23   [place], Harlene HERO, FNP  traZODone  (DESYREL ) 100 MG tablet Take 25 mg by mouth at bedtime as needed for sleep. 03/15/22   [provider]      Allergies    Ezetimibe , Gabapentin, and Statins    Review of Systems   Review of Systems  Respiratory:  Positive for shortness of breath.     Physical Exam Updated Vital Signs BP 117/61   Pulse 72   Temp 98.2 F (36.8 C) (Oral)   Resp 18   Ht 5' 9 (1.753 m)    Wt 80 kg   SpO2 98%   BMI 26.05 kg/m  Physical Exam Vitals and nursing note reviewed.  Constitutional:      Appearance: He is well-developed.  HENT:     Head: Normocephalic and atraumatic.  Eyes:     Pupils: Pupils are equal, round, and reactive to light.  Neck:     Vascular: No JVD.  Cardiovascular:     Rate and Rhythm: Normal rate and regular rhythm.     Heart sounds: No murmur heard.    No friction rub. No gallop.  Pulmonary:     Effort: No respiratory distress.     Breath sounds: No wheezing.  Abdominal:     General: There is no distension.     Tenderness: There is no abdominal tenderness. There is no guarding or rebound.  Musculoskeletal:        General: Normal range of motion.     Cervical back: Normal range of motion and neck supple.  Skin:    Coloration: Skin is not pale.     Findings: No rash.  Neurological:     Mental Status: He is alert and oriented to person, place, and time.  Psychiatric:        Behavior: Behavior normal.     ED Results / Procedures / Treatments   Labs (all labs ordered are listed, but only abnormal results are displayed) Labs Reviewed  CBC WITH DIFFERENTIAL/PLATELET - Abnormal; Notable for the following components:      Result Value   WBC 11.5 (*)    RBC 4.07 (*)    Hemoglobin 11.5 (*)    HCT 37.7 (*)    RDW 18.0 (*)    nRBC 1.6 (*)    Neutro Abs 9.1 (*)    Monocytes Absolute 1.7 (*)    Abs Immature Granulocytes 0.12 (*)    All other components within normal limits  COMPREHENSIVE METABOLIC PANEL - Abnormal; Notable for the following components:   Potassium 5.3 (*)    Chloride 96 (*)    CO2 16 (*)    BUN 77 (*)    Creatinine, Ser 4.10 (*)    Calcium  8.6 (*)    Total Protein 6.0 (*)    AST 3,591 (*)    ALT 2,873 (*)    Alkaline Phosphatase 147 (*)    Total Bilirubin 5.8 (*)    GFR, Estimated 14 (*)    Anion gap 23 (*)    All other components within normal limits  LIPASE, BLOOD - Abnormal; Notable for the following  components:   Lipase 53 (*)    All other  components within normal limits  BRAIN NATRIURETIC PEPTIDE - Abnormal; Notable for the following components:   B Natriuretic Peptide 3,686.6 (*)    All other components within normal limits  ACETAMINOPHEN  LEVEL - Abnormal; Notable for the following components:   Acetaminophen  (Tylenol ), Serum <10 (*)    All other components within normal limits  TROPONIN I (HIGH SENSITIVITY) - Abnormal; Notable for the following components:   Troponin I (High Sensitivity) 3,487 (*)    All other components within normal limits  RESP PANEL BY RT-PCR (RSV, FLU A&B, COVID)  RVPGX2  DIGOXIN  LEVEL  HEPATITIS PANEL, ACUTE  HEPARIN  LEVEL (UNFRACTIONATED)  PROTIME-INR    EKG EKG Interpretation Date/Time:  Saturday August 30 2023 10:54:42 EST Ventricular Rate:  73 PR Interval:  195 QRS Duration:  149 QT Interval:  455 QTC Calculation: 502 R Axis:   45  Text Interpretation: Sinus rhythm Probable left ventricular hypertrophy Prolonged QT interval No significant change since last tracing Confirmed by Emil Share [phone]) on 08/30/2023 11:39:40 AM  Radiology CT ABDOMEN PELVIS WO CONTRAST Result Date: 08/30/2023 CLINICAL DATA:  Nausea and vomiting for the past 2 days. Shortness of breath. EXAM: CT ABDOMEN AND PELVIS WITHOUT CONTRAST TECHNIQUE: Multidetector CT imaging of the abdomen and pelvis was performed following the standard protocol without IV contrast. RADIATION DOSE REDUCTION: This exam was performed according to the departmental dose-optimization program which includes automated exposure control, adjustment of the mA and/or kV according to patient size and/or use of iterative reconstruction technique. COMPARISON:  Right upper quadrant ultrasound dated January 25, 2021. FINDINGS: Lower chest: Cardiomegaly. Small right pleural effusion with adjacent right lower lobe atelectasis. Hepatobiliary: No focal liver abnormality. Status post cholecystectomy. No biliary dilatation.  Pancreas: Unremarkable. No pancreatic ductal dilatation or surrounding inflammatory changes. Spleen: Normal in size without focal abnormality. Adrenals/Urinary Tract: Adrenal glands are unremarkable. Small bilateral renal simple cysts. No follow-up imaging is recommended. Punctate calculus in the upper pole of the right kidney. No hydronephrosis. Bladder is unremarkable. Stomach/Bowel: Stomach is within normal limits. Appendix appears normal. No evidence of bowel wall thickening, distention, or inflammatory changes. Vascular/Lymphatic: Aortic atherosclerosis. No enlarged abdominal or pelvic lymph nodes. Reproductive: Brachytherapy seeds within the prostate gland. Other: Small perihepatic ascites.  No pneumoperitoneum. Musculoskeletal: No acute or significant osseous findings. Prior left total hip arthroplasty. IMPRESSION: 1. No acute intra-abdominal process. 2. Small perihepatic ascites. 3. Cardiomegaly with small right pleural effusion. 4. Punctate nonobstructive right nephrolithiasis. 5.  Aortic Atherosclerosis (ICD10-I70.0). Electronically Signed   By: Elsie ONEIDA Shoulder M.D.   On: 08/30/2023 14:45   DG Chest Port 1 View Result Date: 08/30/2023 CLINICAL DATA:  Short of breath EXAM: PORTABLE CHEST 1 VIEW COMPARISON:  04/30/2021 FINDINGS: LEFT-sided pacer overlies normal cardiac silhouette. No effusion, infiltrate or pneumothorax. IMPRESSION: No acute cardiopulmonary process. Electronically Signed   By: Jackquline Boxer M.D.   On: 08/30/2023 11:37    Procedures .Critical Care  Performed by: Emil Share, DO Authorized by: Emil Share, DO   Critical care provider statement:    Critical care time (minutes):  35   Critical care time was exclusive of:  Separately billable procedures and treating other patients   Critical care was time spent personally by me on the following activities:  Development of treatment plan with patient or surrogate, discussions with consultants, evaluation of patient's response to  treatment, examination of patient, ordering and review of laboratory studies, ordering and review of radiographic studies, ordering and performing treatments and interventions, pulse oximetry, re-evaluation of patient's  condition and review of old charts   Care discussed with: admitting provider       Medications Ordered in ED Medications  heparin  ADULT infusion 100 units/mL (25000 units/250mL) (1,100 Units/hr Intravenous New Bag/Given 08/30/23 1307)  sodium chloride  0.9 % bolus 500 mL (0 mLs Intravenous Stopped 08/30/23 1252)  heparin  bolus via infusion 4,000 Units (4,000 Units Intravenous Bolus from Bag 08/30/23 1311)    ED Course/ Medical Decision Making/ A&P Clinical Course as of 08/30/23 1517  Sat Aug 30, 2023  1509 Tob cards, nandigam GI [MK]    Clinical Course User Index [MK] Kommor, Lum, MD                                 Medical Decision Making Amount and/or Complexity of Data Reviewed Labs: ordered. Radiology: ordered.  Risk Prescription drug management.   83 yo M with a chief complaints of shortness of breath on exertion.  Going on for about 4 days now.  Has a history of heart failure and is on digoxin .  He looks more dry to me on exam.  Also has not really been able to eat and drink for about 48 hours.  Will obtain a laboratory evaluation chest x-ray.  Reassess.  Patient's troponin is significantly elevated.  Concerning for possible NSTEMI.  He also has a acute renal failure and significant elevation of his LFTs.  Will discuss with cardiology.  Discussed with Dr. Sheena, cardiology to consult.   Patient with small ascites.  Cardiomegaly.  Otherwise no obvious intra-abdominal pathology.  Will discuss with GI.  I discussed case with Dr. Shila GI. Based on my description and the patient's lab work and CT imaging thought likely secondary problem having the liver elevation.  GI to formally consult on the patient tomorrow.  Will hold off on further imaging.  The  patients results and plan were reviewed and discussed.   Any x-rays performed were independently reviewed by myself.   Differential diagnosis were considered with the presenting HPI.  Medications  heparin  ADULT infusion 100 units/mL (25000 units/250mL) (1,100 Units/hr Intravenous New Bag/Given 08/30/23 1307)  sodium chloride  0.9 % bolus 500 mL (0 mLs Intravenous Stopped 08/30/23 1252)  heparin  bolus via infusion 4,000 Units (4,000 Units Intravenous Bolus from Bag 08/30/23 1311)    Vitals:   08/30/23 1330 08/30/23 1400 08/30/23 1430 08/30/23 1431  BP: (!) 116/37 (!) 117/42 117/61   Pulse: 71 72 72   Resp: 16 17 18    Temp:    98.2 F (36.8 C)  TempSrc:    Oral  SpO2: 100% 99% 98%   Weight:      Height:        Final diagnoses:  NSTEMI (non-ST elevated myocardial infarction) (HCC)  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  s        Final Clinical Impression(s) / ED Diagnoses Final diagnoses:  NSTEMI (non-ST elevated myocardial infarction) (HCC)  Acute on chronic systolic congestive heart failure [facility])    Rx / DC Orders ED Discharge Orders     None         Emil Share, DO 08/30/23 1518

## 2023-08-30 NOTE — ED Notes (Signed)
 ED TO INPATIENT HANDOFF REPORT  ED Nurse Name and Phone #:   S Name/Age/Gender Tommy Ward 83 y.o. male Room/Bed: 023C/023C  Code Status   Code Status: Full Code  Home/SNF/Other Home Patient oriented to: self, place, time, and situation Is this baseline? Yes   Triage Complete: Triage complete  Chief Complaint CHF exacerbation [facility]) [I50.9]  Triage Note Pt arrived via [facility] for SOBx4d. Pt SOB gets worse w/exertion. Pt c/o N/Vx2d. Pt is Eupneic.   Allergies Allergies  Allergen Reactions   Ezetimibe  Other (See Comments)    Myalgia   Gabapentin Itching and Other (See Comments)    Sore throat, breakouts   Statins Other (See Comments)    Myalgias (intolerance)    Level of Care/Admitting Diagnosis ED Disposition     ED Disposition  Admit   Condition  --   Comment  Hospital Area: [facility] [[id number]]  Level of Care: Progressive [[id number]]  Admit to Progressive based on following criteria: CARDIOVASCULAR & THORACIC of moderate stability with acute coronary syndrome symptoms/low risk myocardial infarction/hypertensive urgency/arrhythmias/heart failure potentially compromising stability and stable post cardiovascular intervention patients.  May admit patient to [facility] or [facility] if equivalent level of care is available:: No  Covid Evaluation: Asymptomatic - no recent exposure (last 10 days) testing not required  Diagnosis: CHF exacerbation [facility]) [[id number]]  Admitting Physician: CINDY GARNETTE POUR [[id number]]  Attending Physician: CINDY GARNETTE POUR [phone]  Certification:: I certify this patient will need inpatient services for at least 2 midnights  Expected Medical Readiness: 09/02/2023          B Medical/Surgery History Past Medical History:  Diagnosis Date   Abnormal stress test 12/05/2020   AKI (acute kidney injury) (HCC) 11/25/2019   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   Anemia due to stage 4 chronic kidney  disease (HCC) 03/20/2022   Aortic valve disorder 07/08/2014   B12 deficiency 03/22/2018   Benign hypertension with chronic kidney disease, stage III (HCC) 05/20/2019   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) 05/20/2019   CAD (coronary artery disease) 11/23/2020   Cancer (HCC) 08/2008   prostate ca   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   Cataract 11/2013   bilateral   Chronic renal insufficiency, stage III (moderate) (HCC) 11/27/2015   Chronic systolic heart failure (HCC)    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 02/24/2019   Coronary arteriosclerosis 07/06/2012   Decreased cardiac ejection fraction 05/20/2019   Depressed left ventricular ejection fraction 12/05/2020   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   Essential hypertension 07/06/2012   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   Gastroesophageal reflux disease without esophagitis 03/22/2018   GERD (gastroesophageal reflux disease)    HFrEF (heart failure with reduced ejection fraction) (HCC) 11/23/2020   Hypercalcemia of malignancy 02/16/2021   Hyperkalemia 08/24/2019   Hyperlipidemia    Hyperphosphatemia 11/25/2019   Hypertension    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   Left bundle branch block 07/06/2012   Malignant neoplasm of prostate (HCC)    Malignant neoplasm of prostate (HCC)    Metabolic bone disease 08/24/2019   Mixed hyperlipidemia 07/06/2012   Nonrheumatic mitral valve regurgitation 12/05/2020   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   Olecranon bursitis of left elbow 06/25/2018   Pre-diabetes 12/05/2020   Pulmonary hypertension (HCC) 12/05/2020   S/P radiation therapy > 12 wks ago 2010   prostate CA   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)    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HCC) 12/05/2020   Tobacco use 12/05/2020   Vitamin D deficiency 08/24/2019   Vitamin D toxicity 07/24/2022   Past Surgical History:   Procedure Laterality Date   BIV ICD INSERTION CRT-D N/A 05/07/2021   Procedure: BIV ICD INSERTION CRT-D;  Surgeon: Cindie Ole DASEN, MD;  Location: [facility] INVASIVE CV LAB;  Service: Cardiovascular;  Laterality: N/A;   CHOLECYSTECTOMY  01/2003   lap chole   left hip repaired     RIGHT/LEFT HEART CATH AND CORONARY ANGIOGRAPHY N/A 12/08/2020   Procedure: RIGHT/LEFT HEART CATH AND CORONARY ANGIOGRAPHY;  Surgeon: Darron Deatrice LABOR, MD;  Location: [facility] INVASIVE CV LAB;  Service: Cardiovascular;  Laterality: N/A;   TEE WITHOUT CARDIOVERSION N/A 01/26/2021   Procedure: TRANSESOPHAGEAL ECHOCARDIOGRAM (TEE);  Surgeon: Rolan Ezra RAMAN, MD;  Location: [facility] ENDOSCOPY;  Service: Cardiovascular;  Laterality: N/A;     A IV Location/Drains/Wounds Patient Lines/Drains/Airways Status     Active Line/Drains/Airways     Name Placement date Placement time Site Days   Peripheral IV 08/30/23 20 G Right Antecubital 08/30/23  1143  Antecubital  less than 1   Peripheral IV 08/30/23 20 G Anterior;Left Forearm 08/30/23  1607  Forearm  less than 1            Intake/Output Last 24 hours  Intake/Output Summary (Last 24 hours) at 08/30/2023 1922 Last data filed at 08/30/2023 1252 Gross per 24 hour  Intake 500 ml  Output --  Net 500 ml    Labs/Imaging Results for orders placed or performed during the hospital encounter of 08/30/23 (from the past 48 hours)  CBC with Differential     Status: Abnormal   Collection Time: 08/30/23 11:44 AM  Result Value Ref Range   WBC 11.5 (H) 4.0 - 10.5 K/uL   RBC 4.07 (L) 4.22 - 5.81 MIL/uL   Hemoglobin 11.5 (L) 13.0 - 17.0 g/dL   HCT 62.2 (L) 60.9 - 47.9 %   MCV 92.6 80.0 - 100.0 fL   MCH 28.3 26.0 - 34.0 pg   MCHC 30.5 30.0 - 36.0 g/dL   RDW 81.9 (H) 88.4 - 84.4 %   Platelets 166 150 - 400 K/uL   nRBC 1.6 (H) 0.0 - 0.2 %   Neutrophils Relative % 78 %   Neutro Abs 9.1 (H) 1.7 - 7.7 K/uL   Lymphocytes Relative 6 %   Lymphs Abs 0.7 0.7 - 4.0 K/uL   Monocytes Relative 15  %   Monocytes Absolute 1.7 (H) 0.1 - 1.0 K/uL   Eosinophils Relative 0 %   Eosinophils Absolute 0.0 0.0 - 0.5 K/uL   Basophils Relative 0 %   Basophils Absolute 0.0 0.0 - 0.1 K/uL   Immature Granulocytes 1 %   Abs Immature Granulocytes 0.12 (H) 0.00 - 0.07 K/uL    Comment: Performed at [facility], [address], [place], [place] [address]  Comprehensive metabolic panel     Status: Abnormal   Collection Time: 08/30/23 11:44 AM  Result Value Ref Range   Sodium 135 135 - 145 mmol/L   Potassium 5.3 (H) 3.5 - 5.1 mmol/L   Chloride 96 (L) 98 - 111 mmol/L   CO2 16 (L) 22 - 32 mmol/L   Glucose, Bld 94 70 - 99 mg/dL    Comment: Glucose reference range applies only to samples taken after fasting for at least 8 hours.   BUN 77 (H) 8 - 23 mg/dL   Creatinine, Ser 5.89 (  H) 0.61 - 1.24 mg/dL   Calcium  8.6 (L) 8.9 - 10.3 mg/dL   Total Protein 6.0 (L) 6.5 - 8.1 g/dL   Albumin 3.5 3.5 - 5.0 g/dL   AST 6,408 (H) 15 - 41 U/L    Comment: RESULT CONFIRMED BY MANUAL DILUTION   ALT 2,873 (H) 0 - 44 U/L    Comment: RESULT CONFIRMED BY MANUAL DILUTION   Alkaline Phosphatase 147 (H) 38 - 126 U/L   Total Bilirubin 5.8 (H) 0.0 - 1.2 mg/dL   GFR, Estimated 14 (L) >60 mL/min    Comment: (NOTE) Calculated using the CKD-EPI Creatinine Equation (2021)    Anion gap 23 (H) 5 - 15    Comment: ELECTROLYTES REPEATED TO VERIFY Performed at [facility], [address], [place], [place] [address]   Lipase, blood     Status: Abnormal   Collection Time: 08/30/23 11:44 AM  Result Value Ref Range   Lipase 53 (H) 11 - 51 U/L    Comment: Performed at [facility], [address], [place], [place] [address]  Digoxin  level     Status: None   Collection Time: 08/30/23 11:44 AM  Result Value Ref Range   Digoxin  Level 1.5 0.8 - 2.0 ng/mL    Comment: Performed at [facility], [address], [place], [place] [address]  Resp panel by RT-PCR (RSV, Flu A&B, Covid) Anterior Nasal Swab     Status:  None   Collection Time: 08/30/23 11:44 AM   Specimen: Anterior Nasal Swab  Result Value Ref Range   SARS Coronavirus 2 by RT PCR NEGATIVE NEGATIVE   Influenza A by PCR NEGATIVE NEGATIVE   Influenza B by PCR NEGATIVE NEGATIVE    Comment: (NOTE) The Xpert Xpress SARS-CoV-2/FLU/RSV plus assay is intended as an aid in the diagnosis of influenza from Nasopharyngeal swab specimens and should not be used as a sole basis for treatment. Nasal washings and aspirates are unacceptable for Xpert Xpress SARS-CoV-2/FLU/RSV testing.  Fact Sheet for Patients: bloggercourse.com  Fact Sheet for Healthcare Providers: seriousbroker.it  This test is not yet approved or cleared by the [place]  FDA and has been authorized for detection and/or diagnosis of SARS-CoV-2 by FDA under an Emergency Use Authorization (EUA). This EUA will remain in effect (meaning this test can be used) for the duration of the COVID-19 declaration under Section 564(b)(1) of the Act, 21 U.S.C. section 360bbb-3(b)(1), unless the authorization is terminated or revoked.     Resp Syncytial Virus by PCR NEGATIVE NEGATIVE    Comment: (NOTE) Fact Sheet for Patients: bloggercourse.com  Fact Sheet for Healthcare Providers: seriousbroker.it  This test is not yet approved or cleared by the [place]  FDA and has been authorized for detection and/or diagnosis of SARS-CoV-2 by FDA under an Emergency Use Authorization (EUA). This EUA will remain in effect (meaning this test can be used) for the duration of the COVID-19 declaration under Section 564(b)(1) of the Act, 21 U.S.C. section 360bbb-3(b)(1), unless the authorization is terminated or revoked.  Performed at [facility], [address], [place], [place] [address]   Brain natriuretic peptide     Status: Abnormal   Collection Time: 08/30/23 11:44 AM  Result Value Ref  Range   B Natriuretic Peptide 3,686.6 (H) 0.0 - 100.0 pg/mL    Comment: Performed at [facility], [address] [place], [place] [address]  Troponin I (High Sensitivity)     Status: Abnormal  Collection Time: 08/30/23 11:44 AM  Result Value Ref Range   Troponin I (High Sensitivity) 3,487 (HH) <18 ng/L    Comment: CRITICAL RESULT CALLED TO, READ BACK BY AND VERIFIED WITH A,BANKS RN @1249  08/30/23 E,BENTON (NOTE) Elevated high sensitivity troponin I (hsTnI) values and significant  changes across serial measurements may suggest ACS but many other  chronic and acute conditions are known to elevate hsTnI results.  Refer to the Links section for chest pain algorithms and additional  guidance. Performed at [facility], [address], [place], [place] [address]   Acetaminophen  level     Status: Abnormal   Collection Time: 08/30/23  1:55 PM  Result Value Ref Range   Acetaminophen  (Tylenol ), Serum <10 (L) 10 - 30 ug/mL    Comment: (NOTE) Therapeutic concentrations vary significantly. A range of 10-30 ug/mL  may be an effective concentration for many patients. However, some  are best treated at concentrations outside of this range. Acetaminophen  concentrations >150 ug/mL at 4 hours after ingestion  and >50 ug/mL at 12 hours after ingestion are often associated with  toxic reactions.  Performed at [facility], [address], [place], [place] [address]   Hepatitis panel, acute     Status: None   Collection Time: 08/30/23  1:55 PM  Result Value Ref Range   Hepatitis B Surface Ag NON REACTIVE NON REACTIVE   HCV Ab NON REACTIVE NON REACTIVE    Comment: (NOTE) Nonreactive HCV antibody screen is consistent with no HCV infections,  unless recent infection is suspected or other evidence exists to indicate HCV infection.     Hep A IgM NON REACTIVE NON REACTIVE   Hep B C IgM NON REACTIVE NON REACTIVE    Comment: Performed at [facility], [address]  [place], [place] [address]  Protime-INR     Status: Abnormal   Collection Time: 08/30/23  4:05 PM  Result Value Ref Range   Prothrombin Time 26.3 (H) 11.4 - 15.2 seconds   INR 2.4 (H) 0.8 - 1.2    Comment: (NOTE) INR goal varies based on device and disease states. Performed at [facility], [address], [place], [place] [address]    CT ABDOMEN PELVIS WO CONTRAST Result Date: 08/30/2023 CLINICAL DATA:  Nausea and vomiting for the past 2 days. Shortness of breath. EXAM: CT ABDOMEN AND PELVIS WITHOUT CONTRAST TECHNIQUE: Multidetector CT imaging of the abdomen and pelvis was performed following the standard protocol without IV contrast. RADIATION DOSE REDUCTION: This exam was performed according to the departmental dose-optimization program which includes automated exposure control, adjustment of the mA and/or kV according to patient size and/or use of iterative reconstruction technique. COMPARISON:  Right upper quadrant ultrasound dated January 25, 2021. FINDINGS: Lower chest: Cardiomegaly. Small right pleural effusion with adjacent right lower lobe atelectasis. Hepatobiliary: No focal liver abnormality. Status post cholecystectomy. No biliary dilatation. Pancreas: Unremarkable. No pancreatic ductal dilatation or surrounding inflammatory changes. Spleen: Normal in size without focal abnormality. Adrenals/Urinary Tract: Adrenal glands are unremarkable. Small bilateral renal simple cysts. No follow-up imaging is recommended. Punctate calculus in the upper pole of the right kidney. No hydronephrosis. Bladder is unremarkable. Stomach/Bowel: Stomach is within normal limits. Appendix appears normal. No evidence of bowel wall thickening, distention, or inflammatory changes. Vascular/Lymphatic: Aortic atherosclerosis. No enlarged abdominal or pelvic lymph nodes. Reproductive: Brachytherapy seeds within the prostate gland. Other: Small perihepatic ascites.  No pneumoperitoneum. Musculoskeletal: No acute or  significant osseous findings.  Prior left total hip arthroplasty. IMPRESSION: 1. No acute intra-abdominal process. 2. Small perihepatic ascites. 3. Cardiomegaly with small right pleural effusion. 4. Punctate nonobstructive right nephrolithiasis. 5.  Aortic Atherosclerosis (ICD10-I70.0). Electronically Signed   By: Elsie ONEIDA Shoulder M.D.   On: 08/30/2023 14:45   DG Chest Port 1 View Result Date: 08/30/2023 CLINICAL DATA:  Short of breath EXAM: PORTABLE CHEST 1 VIEW COMPARISON:  04/30/2021 FINDINGS: LEFT-sided pacer overlies normal cardiac silhouette. No effusion, infiltrate or pneumothorax. IMPRESSION: No acute cardiopulmonary process. Electronically Signed   By: Jackquline Boxer M.D.   On: 08/30/2023 11:37    Pending Labs Unresulted Labs (From admission, onward)     Start     Ordered   08/31/23 0500  Heparin  level (unfractionated)  Daily,   R     Placed in And Linked Group   08/30/23 1256   08/31/23 0500  CBC  Daily,   R     Placed in And Linked Group   08/30/23 1256   08/31/23 0500  Basic metabolic panel  Daily,   R     Comments: As Scheduled for 5 days    08/30/23 1703   08/30/23 2100  Heparin  level (unfractionated)  Once-Timed,   URGENT        08/30/23 1256   08/30/23 1705  Ammonia  Once,   R        08/30/23 1704   08/30/23 1551  Lactic acid, plasma  (Lactic Acid)  Add-on,   AD        08/30/23 1550            Vitals/Pain Today's Vitals   08/30/23 1530 08/30/23 1720 08/30/23 1915 08/30/23 1917  BP: (!) 119/52   (!) 114/46  Pulse: 73 73 74 74  Resp: (!) 23 (!) 21  20  Temp:    (!) 97.5 F (36.4 C)  TempSrc:    Oral  SpO2: 99% 90%  97%  Weight:      Height:      PainSc:    0-No pain    Isolation Precautions No active isolations  Medications Medications  heparin  ADULT infusion 100 units/mL (25000 units/250mL) (1,100 Units/hr Intravenous New Bag/Given 08/30/23 1307)  furosemide  (LASIX ) injection 60 mg (has no administration in time range)  aspirin  EC tablet 81 mg  (81 mg Oral Given 08/30/23 1915)  digoxin  (LANOXIN ) tablet 0.0625 mg (0.0625 mg Oral Given 08/30/23 1915)  metoprolol  succinate (TOPROL -XL) 24 hr tablet 25 mg (has no administration in time range)  pantoprazole  (PROTONIX ) EC tablet 40 mg (40 mg Oral Given 08/30/23 1915)  tamsulosin  (FLOMAX ) capsule 0.4 mg (0.4 mg Oral Given 08/30/23 1915)  sodium chloride  0.9 % bolus 500 mL (0 mLs Intravenous Stopped 08/30/23 1252)  heparin  bolus via infusion 4,000 Units (4,000 Units Intravenous Bolus from Bag 08/30/23 1311)  furosemide  (LASIX ) injection 40 mg (40 mg Intravenous Given 08/30/23 1609)    Mobility walks with device     Focused Assessments Cardiac Assessment Handoff:  Cardiac Rhythm: Normal sinus rhythm, A-V Sequential paced No results found for: CKTOTAL, CKMB, CKMBINDEX, TROPONINI No results found for: DDIMER Does the Patient currently have chest pain? No    R Recommendations: See Admitting Provider Note  Report given to:   Additional Notes:

## 2023-08-30 NOTE — Consult Note (Signed)
 Cardiology Consultation   Patient ID: Tommy Ward MRN: [id number]; DOB: October 04, 1940  Admit date: 08/30/2023 Date of Consult: 08/30/2023  PCP:  Clemmie Nest, MD   [facility] Providers Cardiologist:  Vinie JAYSON Maxcy, MD  Electrophysiologist:  OLE ONEIDA HOLTS, MD       Patient Profile:   Tommy Ward is a 83 y.o. 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 Emil.  History of Present Illness:   [name] presented to the emergency department to be evaluated for worsening shortness of breath. The patient report that he experiencing shortness of breath for about 4-5 days. He denies chest pain or chest pressure. While being evaluated in the ED he was noted to have abnormal labs with elevated troponin (3487), elevated LFTs (open AST 3591, ALT 2873, alk phos 147), elevated Cr (4.10-baseline is 2.1) and BNP was also significantly elevated 3686 and elevated lipase.  In addition the patient tells me that he has felt significantly fatigued and not been eating and drinking like he normally does.  He also reported that he was nauseated but no vomiting.   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   Cataract 11/2013   bilateral   Chronic renal insufficiency, stage III (moderate) (HCC) 11/27/2015   Chronic systolic heart failure (HCC)    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 02/24/2019   Coronary arteriosclerosis 07/06/2012   Decreased cardiac ejection fraction 05/20/2019   Depressed left ventricular ejection fraction 12/05/2020   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   Essential hypertension 07/06/2012   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   Gastroesophageal reflux disease without esophagitis 03/22/2018   GERD (gastroesophageal reflux disease)    HFrEF (heart failure with reduced ejection fraction) (HCC) 11/23/2020   Hypercalcemia of malignancy 02/16/2021   Hyperkalemia 08/24/2019   Hyperlipidemia    Hyperphosphatemia 11/25/2019   Hypertension    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   Left bundle branch block 07/06/2012   Malignant neoplasm of prostate (HCC)    Malignant neoplasm of prostate (HCC)    Metabolic bone disease 08/24/2019   Mixed hyperlipidemia 07/06/2012   Nonrheumatic mitral valve regurgitation 12/05/2020   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   Olecranon bursitis of left elbow 06/25/2018   Pre-diabetes 12/05/2020   Pulmonary hypertension (HCC) 12/05/2020   S/P radiation therapy > 12 wks ago 2010   prostate CA   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HCC)    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HCC) 12/05/2020   Tobacco use 12/05/2020   Vitamin D deficiency 08/24/2019   Vitamin D toxicity 07/24/2022    Past Surgical History:  Procedure Laterality Date   BIV ICD INSERTION CRT-D N/A 05/07/2021   Procedure: BIV ICD INSERTION  CRT-D;  Surgeon: Cindie Ole DASEN, MD;  Location: [facility] INVASIVE CV LAB;  Service: Cardiovascular;  Laterality: N/A;   CHOLECYSTECTOMY  01/2003   lap chole   left hip repaired     RIGHT/LEFT HEART CATH AND CORONARY ANGIOGRAPHY N/A 12/08/2020    Procedure: RIGHT/LEFT HEART CATH AND CORONARY ANGIOGRAPHY;  Surgeon: Darron Deatrice LABOR, MD;  Location: [facility] INVASIVE CV LAB;  Service: Cardiovascular;  Laterality: N/A;   TEE WITHOUT CARDIOVERSION N/A 01/26/2021   Procedure: TRANSESOPHAGEAL ECHOCARDIOGRAM (TEE);  Surgeon: Rolan Ezra RAMAN, MD;  Location: [facility] ENDOSCOPY;  Service: Cardiovascular;  Laterality: N/A;       Inpatient Medications: Scheduled Meds:  Continuous Infusions:  heparin  1,100 Units/hr (08/30/23 1307)   PRN Meds:   Allergies:    Allergies  Allergen Reactions   Ezetimibe  Other (See Comments)    Myalgia   Gabapentin Itching and Other (See Comments)    Sore throat, breakouts   Statins Other (See Comments)    Myalgias (intolerance)    Social History:   Social History   Socioeconomic History   Marital status: Married    Spouse name: Not on file   Number of children: Not on file   Years of education: Not on file   Highest education level: Not on file  Occupational History   Not on file  Tobacco Use   Smoking status: Every Day    Current packs/day: 0.50    Average packs/day: 0.5 packs/day for 50.0 years (25.0 ttl pk-yrs)    Types: Cigarettes   Smokeless tobacco: Never  Vaping Use   Vaping status: Never Used  Substance and Sexual Activity   Alcohol  use: Not Currently   Drug use: Never   Sexual activity: Not on file  Other Topics Concern   Not on file  Social History Narrative   Not on file   Social Drivers of Health   Financial Resource Strain: Low Risk  (05/22/2022)   Overall Financial Resource Strain (CARDIA)    Difficulty of Paying Living Expenses: Not very hard  Food Insecurity: No Food Insecurity (05/22/2022)   Hunger Vital Sign    Worried About Running Out of Food in the Last Year: Never true    Ran Out of Food in the Last Year: Never true  Transportation Needs: No Transportation Needs (05/22/2022)   PRAPARE - Administrator, Civil Service (Medical): No    Lack of Transportation  (Non-Medical): No  Physical Activity: Not on file  Stress: Not on file  Social Connections: Not on file  Intimate Partner Violence: Not on file    Family History:   Family History  Problem Relation Age of Onset   Leukemia Mother    Prostate cancer Father    Heart attack Brother    Colon cancer Neg Hx    Rectal cancer Neg Hx    Stomach cancer Neg Hx    Esophageal cancer Neg Hx      ROS:  Please see the history of present illness.  Shortness of breath All other ROS reviewed and negative.     Physical Exam/Data:   Vitals:   08/30/23 1200 08/30/23 1230 08/30/23 1300 08/30/23 1330  BP: 123/71 122/65 (!) 121/57 (!) 116/37  Pulse: 74 71 70 71  Resp: 14 18 18 16   Temp:      TempSrc:      SpO2: 98% 95% 98% 100%  Weight:      Height:        Intake/Output Summary (Last  24 hours) at 08/30/2023 1419 Last data filed at 08/30/2023 1252 Gross per 24 hour  Intake 500 ml  Output --  Net 500 ml      08/30/2023   10:53 AM 08/27/2023    9:00 AM 08/18/2023    4:10 PM  Last 3 Weights  Weight (lbs) 176 lb 5.9 oz 176 lb 6.4 oz 174 lb 12.8 oz  Weight (kg) 80 kg 80.015 kg 79.289 kg     Body mass index is 26.05 kg/m.  General:  Well nourished, well developed, in no acute distress HEENT: normal Neck: no JVD Vascular: No carotid bruits; Distal pulses 2+ bilaterally Cardiac:  normal S1, S2; RRR; no murmur  Lungs:  clear to auscultation bilaterally, no wheezing, rhonchi or rales  Abd: soft, nontender, no hepatomegaly  Ext: no edema Musculoskeletal:  No deformities, BUE and BLE strength normal and equal Skin: warm and dry  Neuro:  CNs 2-12 intact, no focal abnormalities noted Psych:  Normal affect   EKG:  The EKG was personally reviewed and demonstrates:   Telemetry:  Telemetry was personally reviewed and demonstrates:  paced rhythm  Relevant CV Studies: Review echo from August 06, 2023  Laboratory Data:  High Sensitivity Troponin:   Recent Labs  Lab 08/30/23 1144   TROPONINIHS 3,487*     Chemistry Recent Labs  Lab 08/30/23 1144  NA 135  K 5.3*  CL 96*  CO2 16*  GLUCOSE 94  BUN 77*  CREATININE 4.10*  CALCIUM  8.6*  GFRNONAA 14*  ANIONGAP 23*    Recent Labs  Lab 08/30/23 1144  PROT 6.0*  ALBUMIN 3.5  AST 3,591*  ALT 2,873*  ALKPHOS 147*  BILITOT 5.8*   Lipids No results for input(s): CHOL, TRIG, HDL, LABVLDL, LDLCALC, CHOLHDL in the last 168 hours.  Hematology Recent Labs  Lab 08/30/23 1144  WBC 11.5*  RBC 4.07*  HGB 11.5*  HCT 37.7*  MCV 92.6  MCH 28.3  MCHC 30.5  RDW 18.0*  PLT 166   Thyroid No results for input(s): TSH, FREET4 in the last 168 hours.  BNP Recent Labs  Lab 08/30/23 1144  BNP 3,686.6*    DDimer No results for input(s): DDIMER in the last 168 hours.   Radiology/Studies:  DG Chest Port 1 View Result Date: 08/30/2023 CLINICAL DATA:  Short of breath EXAM: PORTABLE CHEST 1 VIEW COMPARISON:  04/30/2021 FINDINGS: LEFT-sided pacer overlies normal cardiac silhouette. No effusion, infiltrate or pneumothorax. IMPRESSION: No acute cardiopulmonary process. Electronically Signed   By: Jackquline Boxer M.D.   On: 08/30/2023 11:37     Assessment and Plan:   Elevated troponin/NSTEMI suspect type II mismatch in the setting of other underlying acute infection/inflammation Acute hepatic failure: Significant transaminitis could be likely congestion in the setting of acute exacerbation of heart failure Acute on chronic renal failure Moderate mitral regurgitation  His clinical picture raises suspicion for acute exacerbation of heart failure.  CT scan does not show any acute abdominal pathology but some perihepatic ascites.  He does have difficultly elevated BNP as well as transaminitis which could be in the setting of congestion.  Will like to start diuresing the patient.  In the setting of his acute kidney injury as well we will have to use high-dose diuretics today if no good clinical response will  plan for transitioning to Lasix  drip.  Will repeat an echocardiogram.  He will benefit from a nephrology consultation as well given his creatinine is 4 and baseline is 2.1 and a plan  to actively diurese the patient.  Add a lactic acid as well.  In terms of his elevated troponin he does not have any specific anginal symptom chest pain has improved significantly though still short of breath that can be explained in the setting of his acute exacerbation of heart failure.  Will continue to monitor no plan for an ischemic evaluation right now further recommendation as clinical picture evolves.   Risk Assessment/Risk Scores:        [place]  Heart Association (NYHA) Functional Class NYHA Class II        For questions or updates, please contact [facility] Please consult [email or web address] for contact info under    Signed, Felder Lebeda, DO  08/30/2023 2:19 PM

## 2023-08-30 NOTE — Progress Notes (Signed)
 PHARMACY - ANTICOAGULATION CONSULT NOTE  Pharmacy Consult for heparin  Indication: chest pain/ACS  Allergies  Allergen Reactions   Ezetimibe  Other (See Comments)    Myalgia   Gabapentin Itching and Other (See Comments)    Sore throat, breakouts   Statins Other (See Comments)    Myalgias (intolerance)    Patient Measurements: Height: 5' 9 (175.3 cm) Weight: 80 kg (176 lb 5.9 oz) IBW/kg (Calculated) : 70.7 Heparin  Dosing Weight: TBW  Vital Signs: Temp: 98.3 F (36.8 C) (01/11 1055) Temp Source: Oral (01/11 1055) BP: 123/71 (01/11 1200) Pulse Rate: 74 (01/11 1200)  Labs: Recent Labs    08/30/23 1144  HGB 11.5*  HCT 37.7*  PLT 166  CREATININE 4.10*  TROPONINIHS 3,487*    Estimated Creatinine Clearance: 14.1 mL/min (A) (by C-G formula based on SCr of 4.1 mg/dL (H)).   Medical History: 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) 05/20/2019   CAD (coronary artery disease) 11/23/2020   Cancer (HCC) 08/2008   prostate ca   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   Cataract 11/2013   bilateral   Chronic renal insufficiency, stage III (moderate) (HCC) 11/27/2015   Chronic systolic heart failure (HCC)    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 02/24/2019   Coronary arteriosclerosis 07/06/2012   Decreased cardiac ejection fraction 05/20/2019   Depressed left ventricular ejection fraction 12/05/2020   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   Essential hypertension 07/06/2012   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   Gastroesophageal reflux disease without  esophagitis 03/22/2018   GERD (gastroesophageal reflux disease)    HFrEF (heart failure with reduced ejection fraction) (HCC) 11/23/2020   Hypercalcemia of malignancy 02/16/2021   Hyperkalemia 08/24/2019   Hyperlipidemia    Hyperphosphatemia 11/25/2019   Hypertension    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   Left bundle branch block 07/06/2012   Malignant neoplasm of prostate (HCC)    Malignant neoplasm of prostate (HCC)    Metabolic bone disease 08/24/2019   Mixed hyperlipidemia 07/06/2012   Nonrheumatic mitral valve regurgitation 12/05/2020   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   Olecranon bursitis of left elbow 06/25/2018   Pre-diabetes 12/05/2020   Pulmonary hypertension (HCC) 12/05/2020   S/P radiation therapy > 12 wks ago 2010   prostate CA   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HCC)    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HCC) 12/05/2020   Tobacco use 12/05/2020   Vitamin D deficiency 08/24/2019   Vitamin D toxicity 07/24/2022    Assessment: 82 YOM presenting with SOB, elevated troponin, he is not on anticoagulation PTA  Goal of Therapy:  Heparin  level 0.3-0.7 units/ml Monitor platelets by anticoagulation protocol: Yes   Plan:  Heparin  4000 units IV x 1, and gtt at 1100 units/hr F/u 8 hour heparin  level F/u cards eval and recs  Dorn Poot, PharmD, [facility] Clinical Pharmacist ED Pharmacist Phone # [phone] 08/30/2023 12:55 PM

## 2023-08-30 NOTE — ED Notes (Signed)
 Patient transported to CT

## 2023-08-30 NOTE — Progress Notes (Signed)
 PHARMACY - ANTICOAGULATION CONSULT NOTE  Pharmacy Consult for heparin  Indication: chest pain/ACS  Allergies  Allergen Reactions   Ezetimibe  Other (See Comments)    Myalgia   Gabapentin Itching and Other (See Comments)    Sore throat, breakouts   Statins Other (See Comments)    Myalgias (intolerance)    Patient Measurements: Height: 5' 9 (175.3 cm) Weight: 82.7 kg (182 lb 5.1 oz) IBW/kg (Calculated) : 70.7 Heparin  Dosing Weight: TBW  Vital Signs: Temp: 97.8 F (36.6 C) (01/11 2017) Temp Source: Oral (01/11 2017) BP: 121/46 (01/11 2104) Pulse Rate: 74 (01/11 2104)  Labs: Recent Labs    08/30/23 1144 08/30/23 1605 08/30/23 2101  HGB 11.5*  --   --   HCT 37.7*  --   --   PLT 166  --   --   LABPROT  --  26.3*  --   INR  --  2.4*  --   HEPARINUNFRC  --   --  0.10*  CREATININE 4.10*  --   --   TROPONINIHS 3,487*  --   --     Estimated Creatinine Clearance: 14.1 mL/min (A) (by C-G formula based on SCr of 4.1 mg/dL (H)).   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   Cataract 11/2013   bilateral   Chronic renal insufficiency, stage III (moderate) (HCC) 11/27/2015   Chronic systolic heart failure (HCC)    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 02/24/2019   Coronary arteriosclerosis 07/06/2012   Decreased cardiac ejection fraction 05/20/2019   Depressed left ventricular ejection fraction 12/05/2020   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   Essential hypertension  07/06/2012   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   Gastroesophageal reflux disease without esophagitis 03/22/2018   GERD (gastroesophageal reflux disease)    HFrEF (heart failure with reduced ejection fraction) (HCC) 11/23/2020   Hypercalcemia of malignancy 02/16/2021   Hyperkalemia 08/24/2019   Hyperlipidemia    Hyperphosphatemia 11/25/2019   Hypertension    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   Left bundle branch block 07/06/2012   Malignant neoplasm of prostate (HCC)    Malignant neoplasm of prostate (HCC)    Metabolic bone disease 08/24/2019   Mixed hyperlipidemia 07/06/2012   Nonrheumatic mitral valve regurgitation 12/05/2020   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   Olecranon bursitis of left elbow 06/25/2018   Pre-diabetes 12/05/2020   Pulmonary hypertension (HCC) 12/05/2020   S/P radiation therapy > 12 wks ago 2010   prostate CA   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HCC)    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HCC) 12/05/2020   Tobacco use 12/05/2020   Vitamin D deficiency 08/24/2019   Vitamin D toxicity 07/24/2022    Assessment: 82 YOM presenting with SOB, elevated troponin, he is not on anticoagulation PTA Heparin  drip 100 uts/hr with heparin  level subtherapeutic at 0.1  Goal of Therapy:  Heparin  level 0.3-0.7 units/ml Monitor platelets by anticoagulation protocol: Yes   Plan:  Increase heparin  drip 1200 uts/hr  Monitor daily cbc and  heparin  level    Olam Chalk Pharm.D. CPP, BCPS Clinical Pharmacist [phone] 08/30/2023 10:34 PM

## 2023-08-30 NOTE — H&P (Signed)
 History and Physical    Patient: Tommy Ward FMW:[id number] DOB: 25-Jul-1941 DOA: 08/30/2023 DOS: the patient was seen and examined on 08/30/2023 PCP: Clemmie Nest, MD  Patient coming from: Home  Chief Complaint:  Chief Complaint  Patient presents with   Shortness of Breath   HPI: Tommy Ward is a 83 y.o. 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 In ED, pt was found to have BNP of 3686 with trop of 3487. Cr was elevated at 4.10 with K of 5.3. AST was also elevated at 3591 with ALT 2873 with TB of 5.8 and alk phos of 147. CXR was notable for cardiomegaly. Abd CT with mild ascites, otherwise liver appeared unremarkable. No hydronephrosis and bladder appeared unremarkable. Pt was started on heparin  gtt. Cardiology consulted and lasix  initiated. GI and Nephrology were also consulted by EDP. Hospitalist consulted for consideration for admission.   Review of Systems: As mentioned in the history of present illness. All other systems reviewed and are negative. 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) 05/20/2019   CAD (coronary artery disease) 11/23/2020   Cancer (HCC) 08/2008   prostate ca   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   Cataract 11/2013   bilateral   Chronic renal insufficiency, stage III (moderate) (HCC) 11/27/2015   Chronic systolic heart failure (HCC)    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  smoking 02/24/2019   Coronary arteriosclerosis 07/06/2012   Decreased cardiac ejection fraction 05/20/2019   Depressed left ventricular ejection fraction 12/05/2020   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   Essential hypertension 07/06/2012   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   Gastroesophageal reflux disease without esophagitis 03/22/2018   GERD (gastroesophageal reflux disease)    HFrEF (heart failure with reduced ejection fraction) (HCC) 11/23/2020   Hypercalcemia of malignancy 02/16/2021   Hyperkalemia 08/24/2019   Hyperlipidemia    Hyperphosphatemia 11/25/2019   Hypertension    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   Left bundle branch block 07/06/2012   Malignant neoplasm of prostate (HCC)    Malignant neoplasm of prostate (HCC)    Metabolic bone disease 08/24/2019   Mixed hyperlipidemia 07/06/2012   Nonrheumatic mitral valve regurgitation 12/05/2020   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   Olecranon bursitis of left elbow 06/25/2018   Pre-diabetes 12/05/2020   Pulmonary hypertension (HCC) 12/05/2020   S/P radiation therapy > 12 wks ago 2010   prostate CA   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HCC)    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HCC) 12/05/2020   Tobacco use 12/05/2020   Vitamin D deficiency 08/24/2019   Vitamin D toxicity 07/24/2022   Past Surgical History:  Procedure Laterality Date   BIV ICD INSERTION CRT-D N/A 05/07/2021   Procedure: BIV ICD INSERTION CRT-D;  Surgeon: Cindie Ole DASEN, MD;  Location: [facility] INVASIVE CV LAB;  Service: Cardiovascular;  Laterality: N/A;   CHOLECYSTECTOMY  01/2003   lap chole   left hip  repaired     RIGHT/LEFT HEART CATH AND CORONARY ANGIOGRAPHY N/A 12/08/2020   Procedure: RIGHT/LEFT HEART CATH AND CORONARY ANGIOGRAPHY;  Surgeon: Darron Deatrice LABOR, MD;  Location: [facility] INVASIVE CV LAB;  Service: Cardiovascular;  Laterality: N/A;   TEE WITHOUT CARDIOVERSION N/A 01/26/2021    Procedure: TRANSESOPHAGEAL ECHOCARDIOGRAM (TEE);  Surgeon: Rolan Ezra RAMAN, MD;  Location: [facility] ENDOSCOPY;  Service: Cardiovascular;  Laterality: N/A;   Social History:  reports that he has been smoking cigarettes. He has a 25 pack-year smoking history. He has never used smokeless tobacco. He reports that he does not currently use alcohol . He reports that he does not use drugs.  Allergies  Allergen Reactions   Ezetimibe  Other (See Comments)    Myalgia   Gabapentin Itching and Other (See Comments)    Sore throat, breakouts   Statins Other (See Comments)    Myalgias (intolerance)    Family History  Problem Relation Age of Onset   Leukemia Mother    Prostate cancer Father    Heart attack Brother    Colon cancer Neg Hx    Rectal cancer Neg Hx    Stomach cancer Neg Hx    Esophageal cancer Neg Hx     Prior to Admission medications   Medication Sig Start Date End Date Taking? Authorizing Provider  aspirin  EC 81 MG tablet Take 1 tablet (81 mg total) by mouth daily. 09/20/19   Hilty, Vinie BROCKS, MD  Cetirizine HCl 10 MG CAPS Take 1 capsule by mouth daily.    [provider]  cholestyramine  light (PREVALITE ) 4 g packet Take 4 g by mouth 2 (two) times daily as needed (cholesterol).    [provider]  dapagliflozin  propanediol (FARXIGA ) 10 MG TABS tablet Take 1 tablet (10 mg total) by mouth daily before breakfast. 05/28/23   Milford, Harlene HERO, FNP  digoxin  (LANOXIN ) 0.125 MG tablet Take 0.5 tablets (0.0625 mg total) by mouth daily. 07/02/23   Rolan Ezra RAMAN, MD  enzalutamide  (XTANDI ) 40 MG tablet Take 160 mg by mouth daily.    [provider]  Evolocumab  (REPATHA  SURECLICK) 140 MG/ML SOAJ INJECT 1 DOSE UNDER THE SKIN EVERY 14 DAYS 01/15/23   Hilty, Vinie BROCKS, MD  FISH OIL-KRILL OIL PO Take 500 mg by mouth daily.    [provider]  furosemide  (LASIX ) 20 MG tablet Take 20 mg by mouth daily. 08/29/23   [provider]  metolazone  (ZAROXOLYN ) 2.5 MG  tablet Take 1 tablet (2.5 mg total) by mouth as directed. Only take as directed by the advanced heart failure clinic 06/05/23 09/03/23  Glena Harlene HERO, FNP  metoprolol  succinate (TOPROL  XL) 25 MG 24 hr tablet Take 1 tablet (25 mg total) by mouth at bedtime. 06/05/23   Milford, Harlene HERO, FNP  Multiple Vitamins-Minerals (PRESERVISION/LUTEIN) CAPS Take 1 capsule by mouth at bedtime.    [provider]  omeprazole  (PRILOSEC) 40 MG capsule Take 40 mg by mouth daily. 12/11/21   [provider]  omeprazole  (PRILOSEC) 40 MG capsule Take 1 capsule (40 mg total) by mouth daily. 08/19/23     ondansetron  (ZOFRAN -ODT) 4 MG disintegrating tablet Take 4 mg by mouth every 8 (eight) hours as needed. 08/29/23   [provider]  potassium chloride  SA (KLOR-CON  M) 20 MEQ tablet Take 1 tablet (20 mEq total) by mouth daily. 05/28/23   Milford, Harlene HERO, FNP  pregabalin  (LYRICA ) 100 MG capsule Take 100 mg by mouth 3 (three) times daily.    [provider]  rosuvastatin  (CRESTOR ) 5 MG tablet Take 1 tablet (5 mg total) by mouth daily. 05/28/23   Milford, Harlene HERO, FNP  spironolactone  (ALDACTONE ) 25 MG tablet Take 1 tablet (25 mg total) by mouth daily. 05/28/23   Milford, Harlene HERO, FNP  tamsulosin  (FLOMAX ) 0.4 MG CAPS capsule Take 1 capsule (0.4 mg total) by mouth daily. 07/02/23   Rolan Ezra RAMAN, MD  torsemide  (DEMADEX ) 20 MG tablet Take 4 tablets (80 mg total) by mouth in the morning AND 2 tablets (40 mg total) every evening. 05/28/23   Milford, Harlene HERO, FNP  traZODone  (DESYREL ) 100 MG tablet Take 25 mg by mouth at bedtime as needed for sleep. 03/15/22   [provider]    Physical Exam: Vitals:   08/30/23 1430 08/30/23 1431 08/30/23 1500 08/30/23 1530  BP: 117/61  (!) 117/43 (!) 119/52  Pulse: 72  73 73  Resp: 18  11 (!) 23  Temp:  98.2 F (36.8 C)    TempSrc:  Oral    SpO2: 98%  (!) 87% 99%  Weight:      Height:       General exam: Awake, laying in bed, in  nad Respiratory system: Normal respiratory effort, no wheezing Cardiovascular system: regular rate, s1, s2 Gastrointestinal system: Soft, nondistended, positive BS Central nervous system: CN2-12 grossly intact, strength intact Extremities: Perfused, no clubbing Skin: Normal skin turgor, no notable skin lesions seen Psychiatry: Mood normal // no visual hallucinations   Data Reviewed:  Labs reviewed: Na 135, K 5.3, Cr 4.10, Alk phos 147, AST 3591, ALT 2873, TB 5.8, WBC 11.5, Hgb 11.5  Assessment and Plan: Acutely decompensated systolic CHF -BNP in excess of 3487 this admit, up from 1327 on 12/16, findings concerning for acute heart failure -Cardiology consulted and is following, IV lasix  recommended -Cannot give ACEI or ARB given below renal failure -Follow I/O and daily weights -follow bmet in AM  Elevated LFT -Abd ct reviewed, liver appears unremarkable -Question if elevated LFT is related to congestive disease -GI was consulted by EDP -Avoid hepatotoxic meds -follow LFT trends  Acute on CKD -Prior Cr of 2, now up to 4.10 -No hydronephrosis on CT and bladder appears unremarkable -will order UA -nephrology consulted by EDP -Follow serial bmet trends  Hyperkalemia -Suspect may improve with aggressive diuresis that Cardiology is planning -Follow trends  Elevated trop -Presenting trop of 3487 -Per Cardiology, no immediate plans for ischemic eval, however further recs are expected as clinical picture evolves  Prostate ca -Followed by Dr. Ezzard in [place] -brachytherapy seeds are seen on CT abd   Advance Care Planning:   Code Status: Prior Full  Consults: Cardiology, GI, Nephrology  Family Communication: Pt in room  Severity of Illness: The appropriate patient status for this patient is INPATIENT. Inpatient status is judged to be reasonable and necessary in order to provide the required intensity of service to ensure the patient's safety. The patient's presenting  symptoms, physical exam findings, and initial radiographic and laboratory data in the context of their chronic comorbidities is felt to place them at high risk for further clinical deterioration. Furthermore, it is not anticipated that the patient will be medically stable for discharge from the hospital within 2 midnights of admission.   * I certify that at the point of admission it is my clinical judgment that the patient will require inpatient hospital care spanning beyond 2 midnights from the point of admission due to high intensity of service, high risk for further  deterioration and high frequency of surveillance required.*  Author: Garnette Pelt, MD 08/30/2023 4:14 PM  For on call review [email or web address].

## 2023-08-30 NOTE — Consult Note (Signed)
 Renal Service Consult Note [facility]  Tommy Ward [facility] 08/30/2023 Tommy JONETTA Fret, MD Requesting Physician: Dr. Cindy  Reason for Consult: Renal failure HPI: The patient is a 83 y.o. year-old w/ PMH as below who presented to ED today c/o SOB and DOE, also N/V x 2 days. No CP, no fevers. No abd pain or dark stools. Not felt like eating or drinking for the last few days. Sig nausea > emesis. Hx of prostate cancer, CKD 4, HFrEF w/ EF 25-30%, HTN, HL. In ED BP 120/ 50, HR 70 , RR 11-22, RA 90-100% on RA. Labs showed WBC 11K, Hb 11.5, K+ 5.3 , creat 4.10 (b/l 2.0- 2.3), CO2 16, BUN 77, Ca 8.6, alb 3.5 , lipase 53, AST 3591, ALT 2873, tprot 6.0. NH3 29, Tbili 5.8.  CXR showed CM , no edema. ABD showed mild ascites, no hydronephrosis. Trop was 3487. IV was started and cardiology consulted and IV lasix  was started. We are asked to see for renal failure.    Pt seen in ED room.  Pt was lethargic and was a poor historian, didn't get much hx from him.   ROS - n/a, poor historian   Past Medical History  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) 05/20/2019   CAD (coronary artery disease) 11/23/2020   Cancer (HCC) 08/2008   prostate ca   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   Cataract 11/2013   bilateral   Chronic renal insufficiency, stage III (moderate) (HCC) 11/27/2015   Chronic systolic heart failure (HCC)    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 02/24/2019   Coronary arteriosclerosis 07/06/2012   Decreased cardiac ejection fraction 05/20/2019   Depressed left  ventricular ejection fraction 12/05/2020   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   Essential hypertension 07/06/2012   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   Gastroesophageal reflux disease without esophagitis 03/22/2018   GERD (gastroesophageal reflux disease)    HFrEF (heart failure with reduced ejection fraction) (HCC) 11/23/2020   Hypercalcemia of malignancy 02/16/2021   Hyperkalemia 08/24/2019   Hyperlipidemia    Hyperphosphatemia 11/25/2019   Hypertension    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   Left bundle branch block 07/06/2012   Malignant neoplasm of prostate (HCC)    Malignant neoplasm of prostate (HCC)    Metabolic bone disease 08/24/2019   Mixed hyperlipidemia 07/06/2012   Nonrheumatic mitral valve regurgitation 12/05/2020   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   Olecranon bursitis of left elbow 06/25/2018   Pre-diabetes 12/05/2020   Pulmonary hypertension (HCC) 12/05/2020   S/P radiation therapy > 12 wks ago 2010   prostate CA   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HCC)    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HCC) 12/05/2020   Tobacco use 12/05/2020   Vitamin D deficiency 08/24/2019   Vitamin D toxicity 07/24/2022   Past Surgical History  Past Surgical History:  Procedure Laterality Date   BIV ICD INSERTION CRT-D N/A 05/07/2021   Procedure: BIV ICD INSERTION CRT-D;  Surgeon: Cindie Ole DASEN, MD;  Location: [facility] INVASIVE CV LAB;  Service: Cardiovascular;  Laterality:  N/A;   CHOLECYSTECTOMY  01/2003   lap chole   left hip repaired     RIGHT/LEFT HEART CATH AND CORONARY ANGIOGRAPHY N/A 12/08/2020   Procedure: RIGHT/LEFT HEART CATH AND CORONARY ANGIOGRAPHY;  Surgeon: Darron Deatrice LABOR, MD;  Location: [facility] INVASIVE CV LAB;  Service: Cardiovascular;  Laterality: N/A;   TEE WITHOUT CARDIOVERSION N/A 01/26/2021   Procedure: TRANSESOPHAGEAL ECHOCARDIOGRAM (TEE);  Surgeon: Rolan Ezra RAMAN, MD;  Location: [facility]  ENDOSCOPY;  Service: Cardiovascular;  Laterality: N/A;   Family History  Family History  Problem Relation Age of Onset   Leukemia Mother    Prostate cancer Father    Heart attack Brother    Colon cancer Neg Hx    Rectal cancer Neg Hx    Stomach cancer Neg Hx    Esophageal cancer Neg Hx    Social History  reports that he has been smoking cigarettes. He has a 25 pack-year smoking history. He has never used smokeless tobacco. He reports that he does not currently use alcohol . He reports that he does not use drugs. Allergies  Allergies  Allergen Reactions   Ezetimibe  Other (See Comments)    Myalgia   Gabapentin Itching and Other (See Comments)    Sore throat, breakouts   Statins Other (See Comments)    Myalgias (intolerance)   Home medications Prior to Admission medications   Medication Sig Start Date End Date Taking? Authorizing Provider  aspirin  EC 81 MG tablet Take 1 tablet (81 mg total) by mouth daily. 09/20/19  Yes Hilty, Vinie BROCKS, MD  Cetirizine HCl 10 MG CAPS Take 1 capsule by mouth daily.   Yes [provider]  dapagliflozin  propanediol (FARXIGA ) 10 MG TABS tablet Take 1 tablet (10 mg total) by mouth daily before breakfast. 05/28/23  Yes Milford, Harlene HERO, FNP  digoxin  (LANOXIN ) 0.125 MG tablet Take 0.5 tablets (0.0625 mg total) by mouth daily. 07/02/23  Yes Rolan Ezra RAMAN, MD  enzalutamide  (XTANDI ) 40 MG tablet Take 160 mg by mouth daily.   Yes [provider]  Evolocumab  (REPATHA  SURECLICK) 140 MG/ML SOAJ INJECT 1 DOSE UNDER THE SKIN EVERY 14 DAYS 01/15/23  Yes Hilty, Vinie BROCKS, MD  FISH OIL-KRILL OIL PO Take 500 mg by mouth daily.   Yes [provider]  metolazone  (ZAROXOLYN ) 2.5 MG tablet Take 1 tablet (2.5 mg total) by mouth as directed. Only take as directed by the advanced heart failure clinic 06/05/23 09/03/23 Yes Milford, Harlene HERO, FNP  metoprolol  succinate (TOPROL  XL) 25 MG 24 hr tablet Take 1 tablet (25 mg total) by mouth at bedtime.  06/05/23  Yes Milford, Harlene HERO, FNP  Multiple Vitamins-Minerals (PRESERVISION/LUTEIN) CAPS Take 1 capsule by mouth at bedtime.   Yes [provider]  omeprazole  (PRILOSEC) 40 MG capsule Take 1 capsule (40 mg total) by mouth daily. 08/19/23  Yes   ondansetron  (ZOFRAN -ODT) 4 MG disintegrating tablet Take 4 mg by mouth every 8 (eight) hours as needed. 08/29/23  Yes [provider]  potassium chloride  SA (KLOR-CON  M) 20 MEQ tablet Take 1 tablet (20 mEq total) by mouth daily. 05/28/23  Yes Milford, Harlene HERO, FNP  pregabalin  (LYRICA ) 100 MG capsule Take 100 mg by mouth 3 (three) times daily.   Yes [provider]  rosuvastatin  (CRESTOR ) 5 MG tablet Take 1 tablet (5 mg total) by mouth daily. 05/28/23  Yes Milford, Harlene HERO, FNP  spironolactone  (ALDACTONE ) 25 MG tablet Take 1 tablet (25 mg total) by mouth daily. 05/28/23  Yes Milford, [place]  M, FNP  tamsulosin  (FLOMAX ) 0.4 MG CAPS capsule Take 1 capsule (0.4 mg total) by mouth daily. 07/02/23  Yes Rolan Ezra RAMAN, MD  torsemide  (DEMADEX ) 20 MG tablet Take 4 tablets (80 mg total) by mouth in the morning AND 2 tablets (40 mg total) every evening. 05/28/23  Yes Milford, Harlene HERO, FNP  traZODone  (DESYREL ) 100 MG tablet Take 25 mg by mouth at bedtime as needed for sleep. 03/15/22  Yes [provider]  furosemide  (LASIX ) 20 MG tablet Take 20 mg by mouth daily. Patient not taking: Reported on 08/30/2023 08/29/23   [provider]     Vitals:   08/30/23 1720 08/30/23 1915 08/30/23 1917 08/30/23 2017  BP:   (!) 114/46 (!) 121/46  Pulse: 73 74 74 74  Resp: (!) 21  20 20   Temp:   (!) 97.5 F (36.4 C) 97.8 F (36.6 C)  TempSrc:   Oral Oral  SpO2: 90%  97% 98%  Weight:    82.7 kg  Height:       Exam Gen alert, elderly male lying flat, comfortable but lethargic Follows some simple commands No rash, cyanosis or gangrene Sclera anicteric, throat clear  No jvd or bruits Chest clear bilat to bases, no rales/  wheezing RRR no RG Abd soft ntnd no mass or ascites +bs GU nl male MS no joint effusions or deformity Ext no LE or UE edema, no other edema Neuro is alert, Ox 3 , nf     Home meds: - toprol  xl 25, aldactone  25 daily/ torsemide  80 am+ 40 pm/ metolazone  2.5mg  ut dict - farxiga  10 every day - klor-con  20 qd - others: enzalutamide  160 daily, repatha  q 14d, MVI, klor-con , lyrica , trazodone ,crestor , asa, digoxin , PPI, flomax    Date   Creat  eGFR (ml/min) 2020   1.81- 2.87  2021   1.51- 1.70 2022   1.63- 2.20 2023   2.03- 3.31 18- 33 ml/min May- dec 2024 1.87- 2.19 30-36 ml/min  08/30/23   4.10   14 ml/min        UA pend    UNa, UCr pend    CT abd/pelv IMPRESSION: Urinary Tract: No hydronephrosis. Bladder is unremarkable.       Tbili 5.8  Assessment/ Plan: AKI on CKD 3b - b/l creatinine 1.8- 2.2 from may-dec 2024, eGFR 30-36 ml/min. Creat here is 4.1 on admission in the setting of SOB and h/o HFrEF w/ low EF 25-30%. CT showing normal kidneys w/o obstruction. UA pend. BP's are almost low at 114- 121/ 46-55. CXR shows no edema. Exam w/o sig edema. High BNP and trops. AKI due to cardiorenal +/- vol overload +/- vol depletion. No acei/ ARB/ contrast. Trial of IV lasix  started per cardiology. Will get urine lytes and UA. Will follow.  ^^LFT's - AST/ ALT in thousands and Tbili 5.8 HFrEF - w/ EF of 20-25%.  H/o severe pHTN Marlise - started on IV heparin , Cards following      Tommy Fret  MD [facility] 08/30/2023, 8:31 PM  Recent Labs  Lab 08/30/23 1144  HGB 11.5*  ALBUMIN 3.5  CALCIUM  8.6*  CREATININE 4.10*  K 5.3*   Inpatient medications:  aspirin  EC  81 mg Oral Daily   digoxin   0.0625 mg Oral Daily   [START ON 08/31/2023] furosemide   60 mg Intravenous Q12H   metoprolol  succinate  25 mg Oral QHS   pantoprazole   40 mg Oral Daily   tamsulosin   0.4 mg Oral Daily  heparin  1,100 Units/hr (08/30/23 1307)

## 2023-08-31 ENCOUNTER — Inpatient Hospital Stay (HOSPITAL_COMMUNITY): Payer: Medicare Other

## 2023-08-31 DIAGNOSIS — K760 Fatty (change of) liver, not elsewhere classified: Secondary | ICD-10-CM

## 2023-08-31 DIAGNOSIS — I509 Heart failure, unspecified: Secondary | ICD-10-CM

## 2023-08-31 DIAGNOSIS — R188 Other ascites: Secondary | ICD-10-CM | POA: Diagnosis not present

## 2023-08-31 DIAGNOSIS — E875 Hyperkalemia: Secondary | ICD-10-CM

## 2023-08-31 DIAGNOSIS — I5023 Acute on chronic systolic (congestive) heart failure: Secondary | ICD-10-CM

## 2023-08-31 DIAGNOSIS — R7401 Elevation of levels of liver transaminase levels: Secondary | ICD-10-CM | POA: Insufficient documentation

## 2023-08-31 DIAGNOSIS — R7989 Other specified abnormal findings of blood chemistry: Secondary | ICD-10-CM

## 2023-08-31 DIAGNOSIS — I214 Non-ST elevation (NSTEMI) myocardial infarction: Principal | ICD-10-CM

## 2023-08-31 DIAGNOSIS — K761 Chronic passive congestion of liver: Secondary | ICD-10-CM | POA: Diagnosis not present

## 2023-08-31 DIAGNOSIS — K7200 Acute and subacute hepatic failure without coma: Secondary | ICD-10-CM | POA: Diagnosis not present

## 2023-08-31 DIAGNOSIS — N184 Chronic kidney disease, stage 4 (severe): Secondary | ICD-10-CM | POA: Diagnosis not present

## 2023-08-31 DIAGNOSIS — D72829 Elevated white blood cell count, unspecified: Secondary | ICD-10-CM

## 2023-08-31 DIAGNOSIS — R110 Nausea: Secondary | ICD-10-CM

## 2023-08-31 DIAGNOSIS — I10 Essential (primary) hypertension: Secondary | ICD-10-CM

## 2023-08-31 LAB — PROTIME-INR
INR: 3.4 — ABNORMAL HIGH (ref 0.8–1.2)
Prothrombin Time: 34.3 s — ABNORMAL HIGH (ref 11.4–15.2)

## 2023-08-31 LAB — ECHOCARDIOGRAM COMPLETE
AR max vel: 2.38 cm2
AV Area VTI: 2.31 cm2
AV Area mean vel: 2.22 cm2
AV Mean grad: 3 mm[Hg]
AV Peak grad: 5.2 mm[Hg]
Ao pk vel: 1.14 m/s
Area-P 1/2: 4.39 cm2
Est EF: <20
Height: 69 in
S' Lateral: 6.1 cm
Weight: 2894.4 [oz_av]

## 2023-08-31 LAB — COMPREHENSIVE METABOLIC PANEL
ALT: 3201 U/L — ABNORMAL HIGH (ref 0–44)
AST: 3596 U/L — ABNORMAL HIGH (ref 15–41)
Albumin: 3.4 g/dL — ABNORMAL LOW (ref 3.5–5.0)
Alkaline Phosphatase: 150 U/L — ABNORMAL HIGH (ref 38–126)
Anion gap: 25 — ABNORMAL HIGH (ref 5–15)
BUN: 89 mg/dL — ABNORMAL HIGH (ref 8–23)
CO2: 14 mmol/L — ABNORMAL LOW (ref 22–32)
Calcium: 8.1 mg/dL — ABNORMAL LOW (ref 8.9–10.3)
Chloride: 95 mmol/L — ABNORMAL LOW (ref 98–111)
Creatinine, Ser: 4.5 mg/dL — ABNORMAL HIGH (ref 0.61–1.24)
GFR, Estimated: 12 mL/min — ABNORMAL LOW (ref >60)
Glucose, Bld: 67 mg/dL — ABNORMAL LOW (ref 70–99)
Potassium: 5.5 mmol/L — ABNORMAL HIGH (ref 3.5–5.1)
Sodium: 134 mmol/L — ABNORMAL LOW (ref 135–145)
Total Bilirubin: 7 mg/dL — ABNORMAL HIGH (ref 0.0–1.2)
Total Protein: 5.7 g/dL — ABNORMAL LOW (ref 6.5–8.1)

## 2023-08-31 LAB — BASIC METABOLIC PANEL
Anion gap: 28 — ABNORMAL HIGH (ref 5–15)
BUN: 94 mg/dL — ABNORMAL HIGH (ref 8–23)
CO2: 11 mmol/L — ABNORMAL LOW (ref 22–32)
Calcium: 7.8 mg/dL — ABNORMAL LOW (ref 8.9–10.3)
Chloride: 95 mmol/L — ABNORMAL LOW (ref 98–111)
Creatinine, Ser: 4.48 mg/dL — ABNORMAL HIGH (ref 0.61–1.24)
GFR, Estimated: 13 mL/min — ABNORMAL LOW (ref >60)
Glucose, Bld: 73 mg/dL (ref 70–99)
Potassium: 5.3 mmol/L — ABNORMAL HIGH (ref 3.5–5.1)
Sodium: 134 mmol/L — ABNORMAL LOW (ref 135–145)

## 2023-08-31 LAB — LACTIC ACID, PLASMA
Lactic Acid, Venous: 7.5 mmol/L (ref 0.5–1.9)
Lactic Acid, Venous: 7.1 mmol/L (ref 0.5–1.9)
Lactic Acid, Venous: 2.4 mmol/L (ref 0.5–1.9)

## 2023-08-31 LAB — TROPONIN I (HIGH SENSITIVITY)
Troponin I (High Sensitivity): 3553 ng/L (ref <18)
Troponin I (High Sensitivity): 3733 ng/L (ref <18)

## 2023-08-31 LAB — DIGOXIN LEVEL: Digoxin Level: 1.5 ng/mL (ref 0.8–2.0)

## 2023-08-31 LAB — ACETAMINOPHEN LEVEL: Acetaminophen (Tylenol), Serum: <10 ug/mL — ABNORMAL LOW (ref 10–30)

## 2023-08-31 LAB — URINALYSIS, ROUTINE W REFLEX MICROSCOPIC
Bilirubin Urine: NEGATIVE
Glucose, UA: NEGATIVE mg/dL
Hgb urine dipstick: NEGATIVE
Ketones, ur: NEGATIVE mg/dL
Leukocytes,Ua: NEGATIVE
Nitrite: NEGATIVE
Protein, ur: 30 mg/dL — AB
Specific Gravity, Urine: 1.012 (ref 1.005–1.030)
pH: 5 (ref 5.0–8.0)

## 2023-08-31 LAB — BLOOD GAS, VENOUS
Acid-base deficit: 8.6 mmol/L — ABNORMAL HIGH (ref 0.0–2.0)
Bicarbonate: 16.6 mmol/L — ABNORMAL LOW (ref 20.0–28.0)
O2 Saturation: 72.5 %
Patient temperature: 36.4
pCO2, Ven: 32 mm[Hg] — ABNORMAL LOW (ref 44–60)
pH, Ven: 7.32 (ref 7.25–7.43)
pO2, Ven: 45 mm[Hg] (ref 32–45)

## 2023-08-31 LAB — CBC
HCT: 35.5 % — ABNORMAL LOW (ref 39.0–52.0)
HCT: 35 % — ABNORMAL LOW (ref 39.0–52.0)
Hemoglobin: 10.7 g/dL — ABNORMAL LOW (ref 13.0–17.0)
Hemoglobin: 10.8 g/dL — ABNORMAL LOW (ref 13.0–17.0)
MCH: 28 pg (ref 26.0–34.0)
MCH: 28 pg (ref 26.0–34.0)
MCHC: 30.1 g/dL (ref 30.0–36.0)
MCHC: 30.9 g/dL (ref 30.0–36.0)
MCV: 92.9 fL (ref 80.0–100.0)
MCV: 90.7 fL (ref 80.0–100.0)
Platelets: 88 10*3/uL — ABNORMAL LOW (ref 150–400)
Platelets: 85 10*3/uL — ABNORMAL LOW (ref 150–400)
RBC: 3.82 MIL/uL — ABNORMAL LOW (ref 4.22–5.81)
RBC: 3.86 MIL/uL — ABNORMAL LOW (ref 4.22–5.81)
RDW: 18.1 % — ABNORMAL HIGH (ref 11.5–15.5)
RDW: 17.9 % — ABNORMAL HIGH (ref 11.5–15.5)
WBC: 12.2 10*3/uL — ABNORMAL HIGH (ref 4.0–10.5)
WBC: 10.5 10*3/uL (ref 4.0–10.5)
nRBC: 2.5 % — ABNORMAL HIGH (ref 0.0–0.2)
nRBC: 3.3 % — ABNORMAL HIGH (ref 0.0–0.2)

## 2023-08-31 LAB — IRON AND TIBC
Iron: 56 ug/dL (ref 45–182)
Saturation Ratios: 13 % — ABNORMAL LOW (ref 17.9–39.5)
TIBC: 448 ug/dL (ref 250–450)
UIBC: 392 ug/dL

## 2023-08-31 LAB — HEPATIC FUNCTION PANEL
ALT: 3390 U/L — ABNORMAL HIGH (ref 0–44)
AST: 4105 U/L — ABNORMAL HIGH (ref 15–41)
Albumin: 3.1 g/dL — ABNORMAL LOW (ref 3.5–5.0)
Alkaline Phosphatase: 160 U/L — ABNORMAL HIGH (ref 38–126)
Bilirubin, Direct: 3.2 mg/dL — ABNORMAL HIGH (ref 0.0–0.2)
Indirect Bilirubin: 3.1 mg/dL — ABNORMAL HIGH (ref 0.3–0.9)
Total Bilirubin: 6.3 mg/dL — ABNORMAL HIGH (ref 0.0–1.2)
Total Protein: 5.2 g/dL — ABNORMAL LOW (ref 6.5–8.1)

## 2023-08-31 LAB — SALICYLATE LEVEL: Salicylate Lvl: 8 mg/dL (ref 7.0–30.0)

## 2023-08-31 LAB — HEPARIN LEVEL (UNFRACTIONATED)
Heparin Unfractionated: 0.11 [IU]/mL — ABNORMAL LOW (ref 0.30–0.70)
Heparin Unfractionated: 0.16 [IU]/mL — ABNORMAL LOW (ref 0.30–0.70)

## 2023-08-31 LAB — CREATININE, URINE, RANDOM: Creatinine, Urine: 108 mg/dL

## 2023-08-31 LAB — SODIUM, URINE, RANDOM: Sodium, Ur: <10 mmol/L

## 2023-08-31 LAB — FERRITIN: Ferritin: 119 ng/mL (ref 24–336)

## 2023-08-31 LAB — CK: Total CK: 506 U/L — ABNORMAL HIGH (ref 49–397)

## 2023-08-31 LAB — APTT: aPTT: 65 s — ABNORMAL HIGH (ref 24–36)

## 2023-08-31 MED ORDER — PERFLUTREN LIPID MICROSPHERE
1.0000 mL | INTRAVENOUS | Status: AC | PRN
  Administered 2023-08-31: 4 mL via INTRAVENOUS

## 2023-08-31 MED ORDER — LACTATED RINGERS IV BOLUS
500.0000 mL | Freq: Once | INTRAVENOUS | Status: AC
  Administered 2023-08-31: 500 mL via INTRAVENOUS

## 2023-08-31 MED ORDER — SODIUM ZIRCONIUM CYCLOSILICATE 10 G PO PACK: 10.0000 g | PACK | Freq: Three times a day (TID) | ORAL | Status: DC

## 2023-08-31 MED ORDER — STERILE WATER FOR INJECTION IV SOLN
INTRAVENOUS | Status: DC
Start: 2023-08-31 — End: 2023-09-01
  Filled 2023-08-31 (×2): qty 1000

## 2023-08-31 MED ORDER — SODIUM ZIRCONIUM CYCLOSILICATE 10 G PO PACK
10.0000 g | PACK | ORAL | Status: AC
  Administered 2023-08-31: 10 g via ORAL
  Filled 2023-08-31: qty 1

## 2023-08-31 MED ORDER — DEXTROSE 50 % IV SOLN
50.0000 mL | INTRAVENOUS | Status: AC
  Administered 2023-08-31: 50 mL via INTRAVENOUS
  Filled 2023-08-31: qty 50

## 2023-08-31 MED ORDER — SODIUM ZIRCONIUM CYCLOSILICATE 10 G PO PACK
10.0000 g | PACK | Freq: Two times a day (BID) | ORAL | Status: AC
  Administered 2023-08-31: 10 g via ORAL
  Filled 2023-08-31: qty 1

## 2023-08-31 MED ORDER — SODIUM BICARBONATE 8.4 % IV SOLN
50.0000 meq | INTRAVENOUS | Status: AC
  Administered 2023-08-31: 50 meq via INTRAVENOUS
  Filled 2023-08-31: qty 50

## 2023-08-31 NOTE — Evaluation (Signed)
 Physical Therapy Evaluation Patient Details Name: Tommy Ward MRN: [id number] DOB: 10-09-1940 Today's Date: 08/31/2023  History of Present Illness  83 y.o. 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  Working diagnosis of heart failure decompensation complicated with NSTEMI and multiorgan failure.  Clinical Impression  Pt is presenting below baseline level of functioning. Currently pt is Mod A for sit to stand and bed mobility. Pt was unable to progress gait due to unsafe at this time due to posterior lean and poor LLE wgt shifting with low RLE foot clearance. Pt at a high risk for falls. Due to pt current functional status, home set up and available assistance at home recommending skilled physical therapy services < 3 hours/day in order to address strength, balance and functional mobility to decrease risk for falls, injury, immobility, skin break down and re-hospitalization.         If plan is discharge home, recommend the following: A lot of help with walking and/or transfers;Assistance with cooking/housework;Assist for transportation;Supervision due to cognitive status;Help with stairs or ramp for entrance     Equipment Recommendations Other (comment) (defer to post acute)     Functional Status Assessment Patient has had a recent decline in their functional status and demonstrates the ability to make significant improvements in function in a reasonable and predictable amount of time.     Precautions / Restrictions Precautions Precautions: Fall Precaution Comments: fluid restriction, incontinent of urine Restrictions Weight Bearing Restrictions Per Provider Order: No      Mobility  Bed Mobility Overal bed mobility: Needs Assistance Bed Mobility: Sit to Supine       Sit to supine: Mod assist   General bed mobility  comments: Mod A with CGA assist to lower trunk to bed and Mod A for bil LE.    Transfers Overall transfer level: Needs assistance Equipment used: 1 person hand held assist Transfers: Sit to/from Stand, Bed to chair/wheelchair/BSC Sit to Stand: Mod assist   Step pivot transfers: Mod assist       General transfer comment: Difficult lifting and weight shift LLE. Very small shuffling steps. Posterior lean    Ambulation/Gait             Pre-gait activities: Pt took small steps from recliner to EOB. Shuffling steps with moderate posterior lean intermittently. Poor wgt shifting to the L         Balance Overall balance assessment: Needs assistance Sitting-balance support: Feet supported Sitting balance-Leahy Scale: Fair   Postural control: Posterior lean Standing balance support: Reliant on assistive device for balance, During functional activity, Bilateral upper extremity supported Standing balance-Leahy Scale: Poor Standing balance comment: pt requires external support           Pertinent Vitals/Pain Pain Assessment Pain Assessment: No/denies pain    Home Living Family/patient expects to be discharged to:: Private residence Living Arrangements: Spouse/significant other;Children Available Help at Discharge: Family;Available 24 hours/day Type of Home: House Home Access: Stairs to enter Entrance Stairs-Rails: Right Entrance Stairs-Number of Steps: 2 or 3   Home Layout: One level Home Equipment: Shower seat      Prior Function Prior Level of Function : Patient poor historian/Family not available             Mobility Comments: States he walked without an AD and was driving ADLs Comments: States he completed  his own ADL, and participated with iADL     Extremity/Trunk Assessment   Upper Extremity Assessment Upper Extremity Assessment: Defer to OT evaluation    Lower Extremity Assessment Lower Extremity Assessment: Generalized weakness    Cervical /  Trunk Assessment Cervical / Trunk Assessment: Normal  Communication   Communication Communication: No apparent difficulties Cueing Techniques: Verbal cues;Tactile cues  Cognition Arousal: Alert Behavior During Therapy: Flat affect Overall Cognitive Status: No family/caregiver present to determine baseline cognitive functioning Area of Impairment: Orientation, Safety/judgement, Problem solving       Orientation Level: Person     Following Commands: Follows one step commands inconsistently, Follows one step commands with increased time Safety/Judgement: Decreased awareness of safety, Decreased awareness of deficits   Problem Solving: Slow processing, Decreased initiation, Requires verbal cues General Comments: Upon entering room pt was trying to get out of recliner with the legs up and had gotten entangled in IV lines. Pt was assisted getting untangled and asking what are these lines for multiple times.        General Comments General comments (skin integrity, edema, etc.): Pt O2 sats did well throughout session.        Assessment/Plan    PT Assessment Patient needs continued PT services  PT Problem List Decreased activity tolerance;Decreased strength;Decreased mobility;Decreased balance;Decreased safety awareness       PT Treatment Interventions DME instruction;Stair training;Therapeutic activities;Balance training;Gait training;Functional mobility training;Patient/family education;Therapeutic exercise    PT Goals (Current goals can be found in the Care Plan section)  Acute Rehab PT Goals Patient Stated Goal: pt wants to go home PT Goal Formulation: With patient Time For Goal Achievement: 09/14/23 Potential to Achieve Goals: Fair    Frequency Min 1X/week        AM-PAC PT 6 Clicks Mobility  Outcome Measure Help needed turning from your back to your side while in a flat bed without using bedrails?: A Little Help needed moving from lying on your back to sitting  on the side of a flat bed without using bedrails?: A Lot Help needed moving to and from a bed to a chair (including a wheelchair)?: A Lot Help needed standing up from a chair using your arms (e.g., wheelchair or bedside chair)?: A Lot Help needed to walk in hospital room?: Total Help needed climbing 3-5 steps with a railing? : Total 6 Click Score: 11    End of Session Equipment Utilized During Treatment: Gait belt Activity Tolerance: Patient tolerated treatment well Patient left: in bed;with call bell/phone within reach;with bed alarm set Nurse Communication: Mobility status PT Visit Diagnosis: Unsteadiness on feet (R26.81);Other abnormalities of gait and mobility (R26.89)    Time: 8463-8450 PT Time Calculation (min) (ACUTE ONLY): 13 min   Charges:   PT Evaluation $PT Eval Low Complexity: 1 Low   PT General Charges $$ ACUTE PT VISIT: 1 Visit         Dorothyann Maier, DPT, CLT  Acute Rehabilitation Services Office: [phone] (Secure chat preferred)   Dorothyann VEAR Maier 08/31/2023, 5:19 PM

## 2023-08-31 NOTE — Progress Notes (Signed)
*  PRELIMINARY RESULTS* Echocardiogram 2D Echocardiogram has been performed.  Tommy Ward 08/31/2023, 10:15 AM

## 2023-08-31 NOTE — Evaluation (Signed)
 Occupational Therapy Evaluation Patient Details Name: Tommy Ward MRN: [id number] DOB: 1941-07-30 Today's Date: 08/31/2023   History of Present Illness 83 y.o. 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  Working diagnosis of heart failure decompensation complicated with NSTEMI and multiorgan failure.   Clinical Impression   Patient admitted for the diagnosis above.  Patient is confused and has difficulty following commands, thus unsure prior level of function is accurate.  Patient states he lives with his spouse, walked without an AD, remained Independent with ADL, assisted with iADL and continues to drive.  Currently he is needing up to Mod A for upper body ADL, Max A for lower body ADL, and Mod A for simple transfers.  OT will continue efforts in the acute setting, and Patient will benefit from continued inpatient follow up therapy, <3 hours/day.       If plan is discharge home, recommend the following: Assist for transportation;Assistance with cooking/housework;A lot of help with bathing/dressing/bathroom;A lot of help with walking and/or transfers;Direct supervision/assist for financial management;Supervision due to cognitive status;Direct supervision/assist for medications management;Help with stairs or ramp for entrance    Functional Status Assessment  Patient has had a recent decline in their functional status and demonstrates the ability to make significant improvements in function in a reasonable and predictable amount of time.  Equipment Recommendations  BSC/3in1    Recommendations for Other Services       Precautions / Restrictions Precautions Precautions: Fall Precaution Comments: fluid restriction, incontinent of urine Restrictions Weight Bearing Restrictions Per Provider Order: No      Mobility Bed  Mobility Overal bed mobility: Needs Assistance Bed Mobility: Supine to Sit     Supine to sit: Mod assist          Transfers Overall transfer level: Needs assistance   Transfers: Sit to/from Stand, Bed to chair/wheelchair/BSC Sit to Stand: Mod assist     Step pivot transfers: Mod assist     General transfer comment: Difficult lifting and weight shift LLE      Balance Overall balance assessment: Needs assistance Sitting-balance support: Feet supported Sitting balance-Leahy Scale: Fair     Standing balance support: Reliant on assistive device for balance Standing balance-Leahy Scale: Poor                             ADL either performed or assessed with clinical judgement   ADL Overall ADL's : Needs assistance/impaired Eating/Feeding: Set up;Sitting   Grooming: Wash/dry hands;Wash/dry face;Contact guard assist;Sitting   Upper Body Bathing: Minimal assistance;Sitting   Lower Body Bathing: Maximal assistance;Sit to/from stand   Upper Body Dressing : Moderate assistance;Sitting   Lower Body Dressing: Maximal assistance;Sit to/from stand   Toilet Transfer: Moderate assistance;Stand-pivot;Rolling walker (2 wheels);BSC/3in1                   Vision   Vision Assessment?: No apparent visual deficits     Perception Perception: Not tested       Praxis Praxis: Not tested       Pertinent Vitals/Pain Pain Assessment Pain Assessment: No/denies pain     Extremity/Trunk Assessment Upper Extremity Assessment Upper Extremity Assessment: Generalized weakness   Lower Extremity Assessment Lower Extremity Assessment: Defer to PT evaluation   Cervical / Trunk Assessment Cervical / Trunk Assessment: Normal  Communication Communication Communication: No apparent difficulties   Cognition Arousal: Lethargic Behavior During Therapy: Flat affect Overall Cognitive Status: No family/caregiver present to determine baseline cognitive  functioning Area of Impairment: Orientation, Attention, Memory, Following commands, Safety/judgement, Awareness, Problem solving                 Orientation Level: Person Current Attention Level: Focused   Following Commands: Follows one step commands inconsistently, Follows one step commands with increased time Safety/Judgement: Decreased awareness of safety, Decreased awareness of deficits Awareness: Intellectual Problem Solving: Slow processing, Decreased initiation, Requires verbal cues       General Comments   O2 taken off and sat'ing 97% on RA    Exercises     Shoulder Instructions      Home Living Family/patient expects to be discharged to:: Private residence Living Arrangements: Spouse/significant other;Children Available Help at Discharge: Family;Available 24 hours/day Type of Home: House Home Access: Stairs to enter [employer] of Steps: 2 or 3 Entrance Stairs-Rails: Right Home Layout: One level     Bathroom Shower/Tub: Producer, Television/film/video: Standard Bathroom Accessibility: Yes How Accessible: Accessible via walker Home Equipment: Shower seat          Prior Functioning/Environment Prior Level of Function : Patient poor historian/Family not available             Mobility Comments: States he walked without an AD and was driving ADLs Comments: States he completed his own ADL, and participated with iADL        OT Problem List: Decreased strength;Decreased range of motion;Decreased activity tolerance;Impaired balance (sitting and/or standing);Impaired sensation;Decreased knowledge of use of DME or AE;Decreased safety awareness;Decreased cognition      OT Treatment/Interventions: Self-care/ADL training;Therapeutic activities;Patient/family education;DME and/or AE instruction;Balance training    OT Goals(Current goals can be found in the care plan section) Acute Rehab OT Goals Patient Stated Goal: Return home OT Goal  Formulation: With patient Time For Goal Achievement: 09/15/23 Potential to Achieve Goals: Fair ADL Goals Pt Will Perform Grooming: with supervision;standing Pt Will Perform Upper Body Bathing: with set-up;sitting Pt Will Perform Lower Body Bathing: with min assist;sit to/from stand Pt Will Perform Upper Body Dressing: with set-up;sitting Pt Will Perform Lower Body Dressing: with min assist;sit to/from stand Pt Will Transfer to Toilet: with contact guard assist;ambulating;regular height toilet  OT Frequency: Min 1X/week    Co-evaluation              AM-PAC OT 6 Clicks Daily Activity     Outcome Measure Help from another person eating meals?: A Little Help from another person taking care of personal grooming?: A Little Help from another person toileting, which includes using toliet, bedpan, or urinal?: A Lot Help from another person bathing (including washing, rinsing, drying)?: A Lot Help from another person to put on and taking off regular upper body clothing?: A Lot Help from another person to put on and taking off regular lower body clothing?: A Lot 6 Click Score: 14   End of Session Equipment Utilized During Treatment: Gait belt;Rolling walker (2 wheels) Nurse Communication: Mobility status  Activity Tolerance: Patient limited by fatigue Patient left: in chair;with call bell/phone within reach;with chair alarm set  OT Visit Diagnosis: Unsteadiness on feet (R26.81);Muscle weakness (generalized) (M62.81);History of falling (Z91.81);Other symptoms and signs involving cognitive function                Time: 1350-1415 OT Time Calculation (min): 25 min Charges:  OT General Charges $OT Visit:  1 Visit OT Evaluation $OT Eval Moderate Complexity: 1 Mod OT Treatments $Self Care/Home Management : 8-22 mins  08/31/2023  RP, OTR/L  Acute Rehabilitation Services  Office:  [phone]   Charlie JONETTA Halsted 08/31/2023, 2:26 PM

## 2023-08-31 NOTE — Progress Notes (Signed)
 PHARMACY - ANTICOAGULATION CONSULT NOTE  Pharmacy Consult for heparin  Indication: chest pain/ACS  Allergies  Allergen Reactions   Ezetimibe  Other (See Comments)    Myalgia   Gabapentin Itching and Other (See Comments)    Sore throat, breakouts   Statins Other (See Comments)    Myalgias (intolerance)    Patient Measurements: Height: 5' 9 (175.3 cm) Weight: 82.1 kg (180 lb 14.4 oz) IBW/kg (Calculated) : 70.7 Heparin  Dosing Weight: TBW  Vital Signs: Temp: 97.8 F (36.6 C) (01/12 1640) Temp Source: Oral (01/12 1640) BP: 110/60 (01/12 1640) Pulse Rate: 70 (01/12 1640)  Labs: Recent Labs    08/30/23 1144 08/30/23 1605 08/30/23 2101 08/30/23 2259 08/31/23 0222 08/31/23 0522 08/31/23 0718 08/31/23 1029 08/31/23 1745  HGB 11.5*  --   --   --  10.7* 10.8*  --   --   --   HCT 37.7*  --   --   --  35.5* 35.0*  --   --   --   PLT 166  --   --   --  88* 85*  --   --   --   APTT  --   --   --   --   --   --   --   --  65*  LABPROT  --  26.3*  --   --   --   --   --  34.3*  --   INR  --  2.4*  --   --   --   --   --  3.4*  --   HEPARINUNFRC  --   --  0.10*  --  0.11*  --   --   --  0.16*  CREATININE 4.10*  --   --  4.50* 4.48*  --   --   --   --   CKTOTAL  --   --   --   --   --  506*  --   --   --   TROPONINIHS 3,487*  --   --   --   --  6,446* 3,733*  --   --     Estimated Creatinine Clearance: 12.9 mL/min (A) (by C-G formula based on SCr of 4.48 mg/dL (H)).   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   Cataract 11/2013   bilateral   Chronic renal insufficiency, stage III (moderate) (HCC) 11/27/2015   Chronic systolic heart failure (HCC)    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 02/24/2019   Coronary arteriosclerosis 07/06/2012   Decreased cardiac ejection fraction 05/20/2019   Depressed left ventricular ejection fraction 12/05/2020   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   Essential hypertension 07/06/2012   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   Gastroesophageal reflux disease without esophagitis 03/22/2018   GERD (gastroesophageal reflux disease)    HFrEF (heart failure with reduced ejection fraction) (HCC) 11/23/2020   Hypercalcemia of malignancy 02/16/2021   Hyperkalemia 08/24/2019   Hyperlipidemia    Hyperphosphatemia 11/25/2019   Hypertension  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   Left bundle branch block 07/06/2012   Malignant neoplasm of prostate (HCC)    Malignant neoplasm of prostate (HCC)    Metabolic bone disease 08/24/2019   Mixed hyperlipidemia 07/06/2012   Nonrheumatic mitral valve regurgitation 12/05/2020   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   Olecranon bursitis of left elbow 06/25/2018   Pre-diabetes 12/05/2020   Pulmonary hypertension (HCC) 12/05/2020   S/P radiation therapy > 12 wks ago 2010   prostate CA   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HCC)    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HCC) 12/05/2020   Tobacco use 12/05/2020   Vitamin D deficiency 08/24/2019   Vitamin D toxicity 07/24/2022    Assessment: 82 YOM presenting with SOB, elevated troponin, he is not on anticoagulation PTA. Pharmacy Consulted for heparin  management.   Heparin  level 0.16 < goal but can be falsely low in setting of elevated Tbili - aptt 65sec at bottom of range on heparin  drip 1400 uts/hr  No bleeding noted   Goal of Therapy:  Heparin  level 0.3-0.7 units/ml Monitor  platelets by anticoagulation protocol: Yes  Plan:  Increase heparin  drip to 1450 units/hr Monitor daily cbc and heparin  level and aptt Monitor for signs/symptoms of bleeding    Olam Chalk Pharm.D. CPP, BCPS Clinical Pharmacist [phone] 08/31/2023 7:04 PM    Please check AMION for all [facility] Pharmacy phone numbers After 10:00 PM, call Main Pharmacy [phone] 08/31/2023 7:02 PM

## 2023-08-31 NOTE — Assessment & Plan Note (Signed)
 Follow-up as outpatient

## 2023-08-31 NOTE — Progress Notes (Addendum)
 Progress Note   Patient: Tommy Ward FMW:[id number] DOB: 04/22/1941 DOA: 08/30/2023     1 DOS: the patient was seen and examined on 08/31/2023   Brief hospital course: Tommy Ward was admitted to the hospital with the working diagnosis of heart failure decompensation complicated with NSTEMI and multiorgan failure.    83 y.o. 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 In ED, pt was found to have BNP of 3686 with trop of 3487. Cr was elevated at 4.10 with K of 5.3. AST was also elevated at 3591 with ALT 2873 with TB of 5.8 and alk phos of 147. CXR was notable for cardiomegaly. Abd CT with mild ascites, otherwise liver appeared unremarkable. No hydronephrosis and bladder appeared unremarkable. Pt was started on heparin  gtt. Cardiology consulted and lasix  initiated.    Assessment and Plan: * Acute on chronic systolic heart failure (HCC) Echocardiogram from 12/24 with reduced LV systolic function EF 25 to 30%.   Volume status has improved with diuresis.  Continue supportive medical care.  Follow up repeat echocardiogram.  Continue metoprolol , limited pharmacologic therapy due to reduced GFR.    Chronic kidney disease (CKD), stage IV (severe) (HCC) Renal function today with serum cr at 4.48 with K at 5.3 and serum bicarbonate at 11.  Na 134   Plan to start bicarb drip for non anion gap metabolic acidosis and hyperkalemia.  Follow up renal function in am.  Avoid hypotension or nephrotoxic agents.  Holding on diuretic therapy for now.   Continue to have very elevated lactic acid, possible type B.   NSTEMI (non-ST elevated myocardial infarction) (HCC) Known to have coronary artery disease.  Currently with no chest pain.  Continue medical management with IV heparin  and metoprolol .  Follow up on repeat echocardiogram, for wall  motion abnormalities.   Shock liver Continue to have very elevated liver enzymes.  Total bilirubin is down to 6,3 from 7  Acetaminophen  level less than 10 Continue to trend enzymes.   Essential hypertension Continue blood pressure monitoring.    GERD (gastroesophageal reflux disease) Continue with pantoprazole .   Malignant neoplasm of prostate (HCC) Follow up as outpatient.         Subjective: Patient very weak and deconditioned, denies dyspnea or chest pain, no abdominal pain   Physical Exam: Vitals:   08/31/23 0509 08/31/23 0827 08/31/23 1100 08/31/23 1229  BP: (!) 112/92 (!) 104/53  (!) 107/59  Pulse: 72 70  70  Resp: 20 18  18   Temp: (!) 97.3 F (36.3 C) 98.2 F (36.8 C)  98.1 F (36.7 C)  TempSrc: Oral Oral  Oral  SpO2: 100% 93% 91% 100%  Weight: 82.1 kg     Height:       Neurology awake and alert ENT with mild pallor Cardiovascular with S1 and S2 present and regular with no gallops, rubs or murmurs Respiratory with no rales or wheezing, no rhonchi  Abdomen with no distention  No lower extremity edema  Data Reviewed:    Family Communication: I spoke with patient's wife at the bedside, we talked in detail about patient's condition, plan of care and prognosis and all questions were addressed.   Disposition: Status is: Inpatient Remains inpatient appropriate because: IV heparin , supportive medical care  Planned Discharge Destination: Home      Author: Elora Wolter Daniel Kielee Care, MD  08/31/2023 1:55 PM  For on call review [email or web address].

## 2023-08-31 NOTE — Progress Notes (Addendum)
 PHARMACY - ANTICOAGULATION CONSULT NOTE  Pharmacy Consult for heparin  Indication: chest pain/ACS  Allergies  Allergen Reactions   Ezetimibe  Other (See Comments)    Myalgia   Gabapentin Itching and Other (See Comments)    Sore throat, breakouts   Statins Other (See Comments)    Myalgias (intolerance)    Patient Measurements: Height: 5' 9 (175.3 cm) Weight: 82.1 kg (180 lb 14.4 oz) IBW/kg (Calculated) : 70.7 Heparin  Dosing Weight: TBW  Vital Signs: Temp: 97.3 F (36.3 C) (01/12 0509) Temp Source: Oral (01/12 0509) BP: 112/92 (01/12 0509) Pulse Rate: 72 (01/12 0509)  Labs: Recent Labs    08/30/23 1144 08/30/23 1605 08/30/23 2101 08/30/23 2259 08/31/23 0222 08/31/23 0522  HGB 11.5*  --   --   --  10.7* 10.8*  HCT 37.7*  --   --   --  35.5* 35.0*  PLT 166  --   --   --  88* 85*  LABPROT  --  26.3*  --   --   --   --   INR  --  2.4*  --   --   --   --   HEPARINUNFRC  --   --  0.10*  --  0.11*  --   CREATININE 4.10*  --   --  4.50* 4.48*  --   CKTOTAL  --   --   --   --   --  506*  TROPONINIHS 3,487*  --   --   --   --  3,553*    Estimated Creatinine Clearance: 12.9 mL/min (A) (by C-G formula based on SCr of 4.48 mg/dL (H)).   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   Cataract 11/2013   bilateral   Chronic renal insufficiency, stage III (moderate) (HCC) 11/27/2015   Chronic systolic heart failure (HCC)    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 02/24/2019    Coronary arteriosclerosis 07/06/2012   Decreased cardiac ejection fraction 05/20/2019   Depressed left ventricular ejection fraction 12/05/2020   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   Essential hypertension 07/06/2012   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   Gastroesophageal reflux disease without esophagitis 03/22/2018   GERD (gastroesophageal reflux disease)    HFrEF (heart failure with reduced ejection fraction) (HCC) 11/23/2020   Hypercalcemia of malignancy 02/16/2021   Hyperkalemia 08/24/2019   Hyperlipidemia    Hyperphosphatemia 11/25/2019   Hypertension    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   Left bundle branch block 07/06/2012   Malignant neoplasm of prostate (HCC)    Malignant neoplasm of prostate (HCC)    Metabolic bone disease 08/24/2019   Mixed hyperlipidemia 07/06/2012   Nonrheumatic mitral valve regurgitation 12/05/2020   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   Olecranon bursitis of left elbow 06/25/2018   Pre-diabetes 12/05/2020   Pulmonary hypertension (HCC) 12/05/2020   S/P radiation therapy > 12 wks ago 2010   prostate CA   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HCC)    Severe pulmonary  hypertension (HCC) 12/05/2020   Tobacco use 12/05/2020   Vitamin D deficiency 08/24/2019   Vitamin D toxicity 07/24/2022    Assessment: 82 YOM presenting with SOB, elevated troponin, he is not on anticoagulation PTA. Pharmacy Consulted for heparin  management.   Heparin  level 0.12 is subtherapeutic with heparin  running at 1200 units/hr. Hgb (10.8) is stable. PLTs (85) decreased by ~50% since yesterday. No recent administrations of heparin  before this admission. Timeline shows low likelihood of HIT. 4T score of 2 calculated. Will continue to monitor.  Notably, his Tbili was 7.0 yesterday which may be falsely suppressing the heparin  level. Per RN, no report of pauses, issues with the line, or signs of bleeding.    Will slightly increase rate without bolus given potential for falsely suppressed level with elevated Tbili and check an APTT with next level.   Goal of Therapy:  Heparin  level 0.3-0.7 units/ml Monitor platelets by anticoagulation protocol: Yes  Plan:  Increase heparin  drip to 1400 units/hr Check 8 hour heparin  level and APTT Monitor daily cbc and heparin  level Monitor for signs/symptoms of bleeding   Nidia Schaffer, PharmD PGY1 Pharmacy Resident  Please check AMION for all [facility] phone numbers After 10:00 PM, call Main Pharmacy [phone] 08/31/2023 7:25 AM

## 2023-08-31 NOTE — Assessment & Plan Note (Addendum)
 Echocardiogram with significant reduction in LV systolic function with EF <20%, global hypokinesis, internal cavity with severe dilatation, RV with mild to moderate reduction in systolic function, RV with mild enlargement, RA  with severe dilatation, moderate function mitral regurgitation, severe TR.   Lactic acid 2.2  SVO2 70  Systolic blood pressure 100's  Urine output is 3,250 ml.   Continue dobutamine  at 4 mcg/kg/min  Furosemide  80 mg IV q8 hrs  Patient with poor prognosis, multiorgan failure. Not candidate for advanced therapies, if no response to IV inotropic support may need to transition to comfort care.

## 2023-08-31 NOTE — Plan of Care (Signed)

## 2023-08-31 NOTE — Assessment & Plan Note (Addendum)
 Renal function with serum cr at 4,16 with K at 3,3 and serum bicarbonate at 25,  Na 135   Continue hemodynamic support.  Aggressive diuresis with IV furosemide .  Likely not candidate for long term intermittent hemodialysis due to advanced heart failure.  Continue K correction with Kcl Follow up renal function and electrolytes including mg in am.

## 2023-08-31 NOTE — Progress Notes (Signed)
 OT Cancellation Note  Patient Details Name: Tommy Ward MRN: [id number] DOB: Jun 23, 1941   Cancelled Treatment:    Reason Eval/Treat Not Completed: Other (comment).  Patient initiated on heparin  approximately 1300 1/11, OT will await 24 hour, per protocol, prior to initiating eval.    Auryn Paige D Ariella Voit 08/31/2023, 8:27 AM 08/31/2023  RP, OTR/L  Acute Rehabilitation Services  Office:  [phone]

## 2023-08-31 NOTE — Assessment & Plan Note (Addendum)
 AST 1,868  ALT 2,815  T bil 6,0  INR 3.8   Plan to continue supportive medical therapy including inotropic support for heart failure.  Trial of vitamin K IV for coagulopathy. Patient with asterixis or signs of hepatic encephalopathy.

## 2023-08-31 NOTE — Assessment & Plan Note (Addendum)
 Continue blood pressure monitoring

## 2023-08-31 NOTE — Assessment & Plan Note (Signed)
 Continue with pantoprazole/.

## 2023-08-31 NOTE — Consult Note (Signed)
 NAME:  Tommy Ward, MRN:  [id number], DOB:  06-14-1941, LOS: 1 ADMISSION DATE:  08/30/2023, CONSULTATION DATE:  1/12 REFERRING MD:  Dr. Shona, CHIEF COMPLAINT:  NSTEMI   History of Present Illness:  Patient is a 83 year old male with pertinent PMH CKD 3b, HFrEF (most recent LVEF 25 to 30% with and moderate MVR), HTN, HLD, prior cholecystectomy presents to [facility] ED on 1/11 with SOB.  Patient states having SOB over the past 4 days.  States is worse with exertion.  Came to [facility] ED on 1/11 for further eval.  BNP 3686 and Trope 2004 and 87.  CXR with cardiomegaly.  AST 3591 and ALT 2873. T bili 5.8. Ammonia 29 and hepatitis panel negative. INR 2.4. Acetaminophen  level <10. Patient also noted to have AKI with creat 4.10 (baseline 1.9-2.2) and K5.3. LA 8.2 then 7.9. CT abdomen/pelvis with mild ascites; liver unremarkable.  Cardiology consulted.  Was given Lasix  and started on heparin  gtt for NSTEMI.  GI and nephrology also consulted by hospitalist.  Sats stable on room air and hemodynamically stable.  On 1/12 given elevated LFTs and LA consulted PCCM for recs.  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   Cataract 11/2013   bilateral   Chronic renal insufficiency, stage III (moderate) (HCC) 11/27/2015   Chronic systolic heart failure (HCC)    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 02/24/2019   Coronary arteriosclerosis 07/06/2012   Decreased cardiac ejection fraction 05/20/2019    Depressed left ventricular ejection fraction 12/05/2020   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   Essential hypertension 07/06/2012   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   Gastroesophageal reflux disease without esophagitis 03/22/2018   GERD (gastroesophageal reflux disease)    HFrEF (heart failure with reduced ejection fraction) (HCC) 11/23/2020   Hypercalcemia of malignancy 02/16/2021   Hyperkalemia 08/24/2019   Hyperlipidemia    Hyperphosphatemia 11/25/2019   Hypertension    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   Left bundle branch block 07/06/2012   Malignant neoplasm of prostate (HCC)    Malignant neoplasm of prostate (HCC)    Metabolic bone disease 08/24/2019   Mixed hyperlipidemia 07/06/2012   Nonrheumatic mitral valve regurgitation 12/05/2020   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   Olecranon bursitis of left elbow 06/25/2018   Pre-diabetes 12/05/2020   Pulmonary hypertension (HCC) 12/05/2020   S/P radiation therapy > 12 wks ago 2010   prostate CA   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HCC)    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HCC) 12/05/2020   Tobacco use 12/05/2020   Vitamin D deficiency 08/24/2019   Vitamin D toxicity 07/24/2022     Significant Hospital Events: Including procedures, antibiotic start and stop dates in addition to other pertinent events   1/12 pccm consulted  Interim History / Subjective:  See above  Objective   Blood pressure (!) 115/41, pulse 73, temperature 97.7 F (36.5 C), temperature source Oral, resp. rate 20, height 5' 9 (1.753 m), weight 82.7 kg, SpO2  98%.        Intake/Output Summary (Last 24 hours) at 08/31/2023 0245 Last data filed at 08/30/2023 2240 Gross per 24 hour  Intake 1355.61 ml  Output --  Net 1355.61 ml   Filed Weights   08/30/23 1053 08/30/23 2017  Weight: 80 kg 82.7 kg    Examination: General:  NAD HEENT: MM pink/moist Neuro: AO; pleasantly  confused; MAE CV: s1s2, RRR, no m/r/g PULM:  dim clear BS bilaterally GI: soft, bsx4 active  Extremities: warm/dry Skin: no rashes or lesions    Resolved Hospital Problem list     Assessment & Plan:  NSTEMI Acute on chronic HFrEF Moderate MVR Plan: -Cards following; appreciate recs -heparin  gtt -trend trop per primary -lasix  per cards; monitor UOP -GDMT  AKI on CKD 3B Hyperkalemia Lactic acidosis AGMA Plan: -Nephro following; appreciate recs -trend LA -Trend BMP / urinary output -Replace electrolytes as indicated -Avoid nephrotoxic agents, ensure adequate renal perfusion  Acute hepatic failure: Likely congestion in setting of CHF exacerbation; states he has been taking acetaminophen  about 3 times a day for the past few months for aches/pains Plan: -GI consulted; appreciate recs -trend cmp -check RUQ ultrasound -Avoid hepatotoxic meds -repeat acetaminophen  level  Rest per primary  Best Practice (right click and Reselect all SmartList Selections daily)   Primary  Labs   CBC: Recent Labs  Lab 08/30/23 1144  WBC 11.5*  NEUTROABS 9.1*  HGB 11.5*  HCT 37.7*  MCV 92.6  PLT 166    Basic Metabolic Panel: Recent Labs  Lab 08/30/23 1144 08/30/23 2259  NA 135 134*  K 5.3* 5.5*  CL 96* 95*  CO2 16* 14*  GLUCOSE 94 67*  BUN 77* 89*  CREATININE 4.10* 4.50*  CALCIUM  8.6* 8.1*   GFR: Estimated Creatinine Clearance: 12.9 mL/min (A) (by C-G formula based on SCr of 4.5 mg/dL (H)). Recent Labs  Lab 08/30/23 1144 08/30/23 2101 08/30/23 2259  WBC 11.5*  --   --   LATICACIDVEN  --  8.2* 7.9*    Liver Function Tests: Recent Labs  Lab 08/30/23 1144 08/30/23 2259  AST 3,591* 3,596*  ALT 2,873* 3,201*  ALKPHOS 147* 150*  BILITOT 5.8* 7.0*  PROT 6.0* 5.7*  ALBUMIN 3.5 3.4*   Recent Labs  Lab 08/30/23 1144  LIPASE 53*   Recent Labs  Lab 08/30/23 1918  AMMONIA 29    ABG    Component Value Date/Time   PHART 7.345 (L) 12/08/2020 0938    PCO2ART 57.5 (H) 12/08/2020 0938   PO2ART 187 (H) 12/08/2020 0938   HCO3 16.1 (L) 08/30/2023 2259   TCO2 33 (H) 12/08/2020 0940   ACIDBASEDEF 9.9 (H) 08/30/2023 2259   O2SAT 60.6 08/30/2023 2259     Coagulation Profile: Recent Labs  Lab 08/30/23 1605  INR 2.4*    Cardiac Enzymes: No results for input(s): CKTOTAL, CKMB, CKMBINDEX, TROPONINI in the last 168 hours.  HbA1C: Hgb A1c MFr Bld  Date/Time Value Ref Range Status  12/05/2020 11:24 AM 6.1 (H) 4.8 - 5.6 % Final    Comment:             Prediabetes: 5.7 - 6.4          Diabetes: >6.4          Glycemic control for adults with diabetes: <7.0     CBG: No results for input(s): GLUCAP in the last 168 hours.  Review of Systems:   Review of Systems  Constitutional:  Negative for fever.  Respiratory:  Positive for shortness of breath.   Cardiovascular:  Negative for chest pain.  Gastrointestinal:  Negative for abdominal pain, nausea and vomiting.     Past Medical History:  He,  has a past medical history of Abnormal stress test (12/05/2020), AKI (acute kidney injury) (HCC) (11/25/2019), 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HCC) (08/24/2019), Aortic valve disorder (07/08/2014), B12 deficiency (03/22/2018), Benign hypertension with chronic kidney disease, stage III (HCC) (05/20/2019), 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) (05/20/2019), CAD (coronary artery disease) (11/23/2020), Cancer (HCC) (08/2008), Carotid artery occlusion (07/08/2014), Cataract (11/2013), Chronic renal insufficiency, stage III (moderate) (HCC) (11/27/2015), Chronic systolic heart failure (HCC), 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 (02/24/2019), Coronary arteriosclerosis (07/06/2012), Decreased cardiac ejection fraction (05/20/2019), Depressed left ventricular ejection fraction (12/05/2020), Dilated cardiomyopathy (HCC) (12/05/2020), Essential hypertension (07/06/2012), Familial hyperlipidemia (02/24/2019),  Gastroesophageal reflux disease without esophagitis (03/22/2018), GERD (gastroesophageal reflux disease), HFrEF (heart failure with reduced ejection fraction) (HCC) (11/23/2020), Hypercalcemia of malignancy (02/16/2021), Hyperkalemia (08/24/2019), Hyperlipidemia, Hyperphosphatemia (11/25/2019), Hypertension, Hypertensive heart disease without congestive heart failure (07/06/2012), Left bundle branch block (07/06/2012), Malignant neoplasm of prostate (HCC), Malignant neoplasm of prostate (HCC), Metabolic bone disease (08/24/2019), Mixed hyperlipidemia (07/06/2012), Nonrheumatic mitral valve regurgitation (12/05/2020), Nonrheumatic tricuspid valve regurgitation (12/05/2020), Olecranon bursitis of left elbow (06/25/2018), Pre-diabetes (12/05/2020), Pulmonary hypertension (HCC) (12/05/2020), S/P radiation therapy > 12 wks ago (2010), 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HCC), 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HCC) (12/05/2020), Tobacco use (12/05/2020), Vitamin D deficiency (08/24/2019), and Vitamin D toxicity (07/24/2022).   Surgical History:   Past Surgical History:  Procedure Laterality Date   BIV ICD INSERTION CRT-D N/A 05/07/2021   Procedure: BIV ICD INSERTION CRT-D;  Surgeon: Cindie Ole DASEN, MD;  Location: [facility] INVASIVE CV LAB;  Service: Cardiovascular;  Laterality: N/A;   CHOLECYSTECTOMY  01/2003   lap chole   left hip repaired     RIGHT/LEFT HEART CATH AND CORONARY ANGIOGRAPHY N/A 12/08/2020   Procedure: RIGHT/LEFT HEART CATH AND CORONARY ANGIOGRAPHY;  Surgeon: Darron Deatrice LABOR, MD;  Location: [facility] INVASIVE CV LAB;  Service: Cardiovascular;  Laterality: N/A;   TEE WITHOUT CARDIOVERSION N/A 01/26/2021   Procedure: TRANSESOPHAGEAL ECHOCARDIOGRAM (TEE);  Surgeon: Rolan Ezra RAMAN, MD;  Location: [facility] ENDOSCOPY;  Service: Cardiovascular;  Laterality: N/A;     Social History:   reports that he has been smoking cigarettes. He has a 25 pack-year smoking history. He has never used smokeless  tobacco. He reports that he does not currently use alcohol . He reports that he does not use drugs.   Family History:  His family history includes Heart attack in his brother; Leukemia in his mother; Prostate cancer in his father. There is no history of Colon cancer, Rectal cancer, Stomach cancer, or Esophageal cancer.   Allergies Allergies  Allergen Reactions   Ezetimibe  Other (See Comments)    Myalgia   Gabapentin Itching and Other (See Comments)    Sore throat, breakouts   Statins Other (See Comments)    Myalgias (intolerance)     Home Medications  Prior to Admission medications   Medication Sig Start Date End Date Taking? Authorizing Provider  aspirin  EC 81 MG tablet Take 1 tablet (81 mg total) by mouth daily. 09/20/19  Yes Hilty, Vinie BROCKS, MD  Cetirizine HCl 10 MG CAPS Take 1 capsule by mouth daily.   Yes [provider]  dapagliflozin  propanediol (FARXIGA ) 10 MG TABS tablet Take 1 tablet (10 mg total) by mouth daily before breakfast. 05/28/23  Yes Milford, Bradner,  FNP  digoxin  (LANOXIN ) 0.125 MG tablet Take 0.5 tablets (0.0625 mg total) by mouth daily. 07/02/23  Yes Rolan Ezra RAMAN, MD  enzalutamide  (XTANDI ) 40 MG tablet Take 160 mg by mouth daily.   Yes [provider]  Evolocumab  (REPATHA  SURECLICK) 140 MG/ML SOAJ INJECT 1 DOSE UNDER THE SKIN EVERY 14 DAYS 01/15/23  Yes Hilty, Vinie BROCKS, MD  FISH OIL-KRILL OIL PO Take 500 mg by mouth daily.   Yes [provider]  metolazone  (ZAROXOLYN ) 2.5 MG tablet Take 1 tablet (2.5 mg total) by mouth as directed. Only take as directed by the advanced heart failure clinic 06/05/23 09/03/23 Yes Milford, Harlene HERO, FNP  metoprolol  succinate (TOPROL  XL) 25 MG 24 hr tablet Take 1 tablet (25 mg total) by mouth at bedtime. 06/05/23  Yes Milford, Harlene HERO, FNP  Multiple Vitamins-Minerals (PRESERVISION/LUTEIN) CAPS Take 1 capsule by mouth at bedtime.   Yes [provider]  omeprazole  (PRILOSEC) 40 MG capsule Take 1  capsule (40 mg total) by mouth daily. 08/19/23  Yes   ondansetron  (ZOFRAN -ODT) 4 MG disintegrating tablet Take 4 mg by mouth every 8 (eight) hours as needed. 08/29/23  Yes [provider]  potassium chloride  SA (KLOR-CON  M) 20 MEQ tablet Take 1 tablet (20 mEq total) by mouth daily. 05/28/23  Yes Milford, Harlene HERO, FNP  pregabalin  (LYRICA ) 100 MG capsule Take 100 mg by mouth 3 (three) times daily.   Yes [provider]  rosuvastatin  (CRESTOR ) 5 MG tablet Take 1 tablet (5 mg total) by mouth daily. 05/28/23  Yes Milford, Harlene HERO, FNP  spironolactone  (ALDACTONE ) 25 MG tablet Take 1 tablet (25 mg total) by mouth daily. 05/28/23  Yes Milford, Harlene HERO, FNP  tamsulosin  (FLOMAX ) 0.4 MG CAPS capsule Take 1 capsule (0.4 mg total) by mouth daily. 07/02/23  Yes Rolan Ezra RAMAN, MD  torsemide  (DEMADEX ) 20 MG tablet Take 4 tablets (80 mg total) by mouth in the morning AND 2 tablets (40 mg total) every evening. 05/28/23  Yes Milford, Harlene HERO, FNP  traZODone  (DESYREL ) 100 MG tablet Take 25 mg by mouth at bedtime as needed for sleep. 03/15/22  Yes [provider]  furosemide  (LASIX ) 20 MG tablet Take 20 mg by mouth daily. Patient not taking: Reported on 08/30/2023 08/29/23   [provider]         JD Emilio RIGGERS [facility] 08/31/2023, 2:45 AM  Please see [email or web address] for pager details.  From 7A-7P if no response, please call [phone]. After hours, please call ELink [phone].

## 2023-08-31 NOTE — Progress Notes (Signed)
 Received a call from bedside RN regarding the patient's abnormal lab results with lactic acid of 8.2.  Presented at bedside.  The patient is alert and responding to questions appropriately.  Euvolemic on exam.  No abdominal distention.  Denies having any chest discomfort, chest pain, dyspnea, or nausea, while laying in bed.  Denies having any abdominal pain.  On exam, lungs are clear to auscultation, heart is with regular rate and rhythm.  Had some tenderness with squeezing of his left calf.  Repeat lactic acid returned 7.9.  Repeat chemistry panel is showing creatinine level is worsening 4.50 from 4.10.  Serum bicarb is downtrending 14 from 16 with pH of 7.27 on VBG.  The patient received total of 2 A of bicarb with improvement of his metabolic acidosis, repeat pH VBG 7.32.    LFTs are uptrending.  CT abdomen and pelvis without contrast completed earlier was unrevealing.  He is postcholecystectomy, no common bile duct dilatation seen on CT scan.  Acute hepatitis panel is negative, COVID-19, influenza A&B, RSV by PCR, are all negative, acetaminophen  level is less than 10..  The patient is on heparin  drip for NSTEMI.  BNP is elevated greater than 3600.  Currently on IV Lasix  per cardiology.  Also seen by nephrology.  Concern for cardiorenal syndrome and liver congestion in the setting of acute exacerbation of heart failure.  Due to lactic acidosis and worsening elevated liver chemistries, consulted critical care medicine and discussed the case with Dr. Noreen.  Right upper quadrant ultrasound added to further assess his transaminitis and hyperbilirubinemia.  INR, anti-smooth muscle antibody, ferritin level, trans sat, ceruloplasmin also added as part of acute liver failure workup.   Will continue to closely monitor and treat as indicated.   Critical care time: 35 minutes.

## 2023-08-31 NOTE — Progress Notes (Signed)
 Progress Note  Patient Name: Tommy Ward Date of Encounter: 08/31/2023  Primary Cardiologist: Vinie JAYSON Maxcy, MD   Subjective   Patient seen and examined at his bedside.  Inpatient Medications    Scheduled Meds:  aspirin  EC  81 mg Oral Daily   metoprolol  succinate  25 mg Oral QHS   pantoprazole   40 mg Oral Daily   tamsulosin   0.4 mg Oral Daily   Continuous Infusions:  heparin  1,400 Units/hr (08/31/23 1042)   lactated ringers      sodium bicarbonate  150 mEq in sterile water  1,150 mL infusion     PRN Meds:    Vital Signs    Vitals:   08/31/23 0509 08/31/23 0827 08/31/23 1100 08/31/23 1229  BP: (!) 112/92 (!) 104/53  (!) 107/59  Pulse: 72 70  70  Resp: 20 18  18   Temp: (!) 97.3 F (36.3 C) 98.2 F (36.8 C)  98.1 F (36.7 C)  TempSrc: Oral Oral  Oral  SpO2: 100% 93% 91% 100%  Weight: 82.1 kg     Height:        Intake/Output Summary (Last 24 hours) at 08/31/2023 1251 Last data filed at 08/31/2023 0400 Gross per 24 hour  Intake 1355.61 ml  Output 200 ml  Net 1155.61 ml   Filed Weights   08/30/23 1053 08/30/23 2017 08/31/23 0509  Weight: 80 kg 82.7 kg 82.1 kg    Telemetry    Paced rhythm this morning , moment overnight with no ventricular capture - Personally Reviewed  ECG     - Personally Reviewed  Physical Exam     General: Comfortable,  Head: Atraumatic, normal size  Eyes: PEERLA, EOMI  Neck: Supple, normal JVD Cardiac: Normal S1, S2; RRR; no murmurs, rubs, or gallops Lungs: Clear to auscultation bilaterally Abd: Soft, nontender, no hepatomegaly  Ext: warm, no edema Musculoskeletal: No deformities, BUE and BLE strength normal and equal Skin: Warm and dry, no rashes   Neuro: Alert and oriented to person, place, time, and situation, CNII-XII grossly intact, no focal deficits  Psych: Normal mood and affect   Labs    Chemistry Recent Labs  Lab 08/30/23 1144 08/30/23 2259 08/31/23 0222 08/31/23 1029  NA 135 134* 134*  --   K 5.3*  5.5* 5.3*  --   CL 96* 95* 95*  --   CO2 16* 14* 11*  --   GLUCOSE 94 67* 73  --   BUN 77* 89* 94*  --   CREATININE 4.10* 4.50* 4.48*  --   CALCIUM  8.6* 8.1* 7.8*  --   PROT 6.0* 5.7*  --  5.2*  ALBUMIN 3.5 3.4*  --  3.1*  AST 3,591* 3,596*  --  4,105*  ALT 2,873* 3,201*  --  3,390*  ALKPHOS 147* 150*  --  160*  BILITOT 5.8* 7.0*  --  6.3*  GFRNONAA 14* 12* 13*  --   ANIONGAP 23* 25* 28*  --      Hematology Recent Labs  Lab 08/30/23 1144 08/31/23 0222 08/31/23 0522  WBC 11.5* 12.2* 10.5  RBC 4.07* 3.82* 3.86*  HGB 11.5* 10.7* 10.8*  HCT 37.7* 35.5* 35.0*  MCV 92.6 92.9 90.7  MCH 28.3 28.0 28.0  MCHC 30.5 30.1 30.9  RDW 18.0* 18.1* 17.9*  PLT 166 88* 85*    Cardiac EnzymesNo results for input(s): TROPONINI in the last 168 hours. No results for input(s): TROPIPOC in the last 168 hours.   BNP Recent Labs  Lab 08/30/23 1144  BNP 3,686.6*     DDimer No results for input(s): DDIMER in the last 168 hours.   Radiology    US  Abdomen Limited RUQ (LIVER/GB) Result Date: 08/31/2023 CLINICAL DATA:  History of cholecystectomy presenting with transaminitis. EXAM: ULTRASOUND ABDOMEN LIMITED RIGHT UPPER QUADRANT COMPARISON:  None Available. FINDINGS: Gallbladder: The gallbladder is surgically absent. Common bile duct: Diameter: 5.5 mm Liver: No focal lesion identified. Mild, diffusely increased echogenicity of the liver parenchyma is noted. Portal vein is patent on color Doppler imaging with normal direction of blood flow towards the liver. Other: Of incidental note is the presence of a right pleural effusion. A trace amount of ascites is also seen. IMPRESSION: 1. Findings consistent with history of prior cholecystectomy. 2. Hepatic steatosis without focal liver lesions. 3. Right pleural effusion. 4. Trace ascites. Electronically Signed   By: Suzen Dials M.D.   On: 08/31/2023 03:26   CT ABDOMEN PELVIS WO CONTRAST Result Date: 08/30/2023 CLINICAL DATA:  Nausea and  vomiting for the past 2 days. Shortness of breath. EXAM: CT ABDOMEN AND PELVIS WITHOUT CONTRAST TECHNIQUE: Multidetector CT imaging of the abdomen and pelvis was performed following the standard protocol without IV contrast. RADIATION DOSE REDUCTION: This exam was performed according to the departmental dose-optimization program which includes automated exposure control, adjustment of the mA and/or kV according to patient size and/or use of iterative reconstruction technique. COMPARISON:  Right upper quadrant ultrasound dated January 25, 2021. FINDINGS: Lower chest: Cardiomegaly. Small right pleural effusion with adjacent right lower lobe atelectasis. Hepatobiliary: No focal liver abnormality. Status post cholecystectomy. No biliary dilatation. Pancreas: Unremarkable. No pancreatic ductal dilatation or surrounding inflammatory changes. Spleen: Normal in size without focal abnormality. Adrenals/Urinary Tract: Adrenal glands are unremarkable. Small bilateral renal simple cysts. No follow-up imaging is recommended. Punctate calculus in the upper pole of the right kidney. No hydronephrosis. Bladder is unremarkable. Stomach/Bowel: Stomach is within normal limits. Appendix appears normal. No evidence of bowel wall thickening, distention, or inflammatory changes. Vascular/Lymphatic: Aortic atherosclerosis. No enlarged abdominal or pelvic lymph nodes. Reproductive: Brachytherapy seeds within the prostate gland. Other: Small perihepatic ascites.  No pneumoperitoneum. Musculoskeletal: No acute or significant osseous findings. Prior left total hip arthroplasty. IMPRESSION: 1. No acute intra-abdominal process. 2. Small perihepatic ascites. 3. Cardiomegaly with small right pleural effusion. 4. Punctate nonobstructive right nephrolithiasis. 5.  Aortic Atherosclerosis (ICD10-I70.0). Electronically Signed   By: Elsie ONEIDA Shoulder M.D.   On: 08/30/2023 14:45   DG Chest Port 1 View Result Date: 08/30/2023 CLINICAL DATA:  Short of breath  EXAM: PORTABLE CHEST 1 VIEW COMPARISON:  04/30/2021 FINDINGS: LEFT-sided pacer overlies normal cardiac silhouette. No effusion, infiltrate or pneumothorax. IMPRESSION: No acute cardiopulmonary process. Electronically Signed   By: Jackquline Boxer M.D.   On: 08/30/2023 11:37    Cardiac Studies   Echo from 07/2023  Patient Profile     83 y.o. 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  Assessment & Plan    Elevated troponin/NSTEMI suspect type II mismatch in the setting of other underlying acute infection/inflammation Acute hepatic failure: Significant transaminitis could be likely congestion in the setting of acute exacerbation of heart failure Acute on chronic renal failure Moderate mitral regurgitation   He did not respond to the Lasix .  And today appears to be slightly volume depleted.  I do agree with starting some  bicarb on the patient.  Will hold off on any further additional Lasix . I am concerned that his transaminitis continues to increase.  He will benefit from GI.  He looks slightly jaundiced today as well. Known chronic heart failure with reduced ejection fraction.  Echo pending Elevated troponin at this time he has no anginal symptoms we will continue to monitor we will continue heparin  drip for now for the next 48 hours and then stop.       For questions or updates, please contact [facility] Please consult [email or web address] for contact info under Cardiology/STEMI.      Signed, Roosevelt Eimers, DO  08/31/2023, 12:51 PM

## 2023-08-31 NOTE — Consult Note (Addendum)
 Consultation  Referring Provider:  [facility]  Primary Care Physician:  Clemmie Nest, MD Primary Gastroenterologist:  Dr. Charlanne       Reason for Consultation:    Elevated LFTs  LOS: 1 day          HPI:   Tommy Ward is a 83 y.o. 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  Found to have BNP 3686 with troponin 3487. in addition to AKI on CKD with CR 4.10, BUN 77, GFR 14.  Baseline creatinine around 2.  Hyperkalemia of 5.3.  Chest x-ray showed cardiomegaly.  Cardiology and nephrology are following.  GI was consulted for elevated LFTs CT abdomen showed small perihepatic ascites.  No focal liver abnormality.  Cardiomegaly.  Patient was evaluated by cardiology and diagnosed with NSTEMI and started on heparin  drip.  Upon arrival Leukocytosis with WBC 11.5 Hgb 11.5, MCV 92.6 Platelets 166 AST 3591, ALT 2873, alk phos 147 T. bili 5.8 Negative hepatitis panel  Patient states he has had dark urine over the past few days.  Denies abdominal pain, nausea, vomiting, change in bowel habits.  Denies alcohol  use.  Denies NSAID use.  States he has been using Tylenol  3 times daily for the last couple weeks for pain relief.  Denies any other new medication, herbs, or supplements. Negative respiratory panel.  Due to lactic acidosis and worsening LFTs critical care was consulted. Acetaminophen  level less than 10 Iron and ferritin studies normal ASMA/ANA pending RUQ showed hepatic steatosis without liver lesions.  Right pleural effusion.  Trace ascites.  S/p cholecystectomy   PREVIOUS GI WORKUP   Colonoscopy for rectal bleeding and IDA 03/2019 - Mild sigmoid diverticulosis.  - Minimal radiation- induced changes in the rectum. No bleeding.  - Small internal hemorrhoids  - Otherwise  normal colonoscopy to TI. No active bleeding.  EGD for rectal bleeding and IDA 03/2019 - Duodenal erosions.  - Mild gastritis ( biopsied)  - Presbyesophagus.  Diagnosis 1. Surgical [P], small bowel BXs - BENIGN SMALL BOWEL MUCOSA. - NO VILLOUS ATROPHY, INFLAMMATION OR OTHER ABNORMALITIES PRESENT. 2. Surgical [P], stomach, fundus, gastric antrum and gastric body - CHRONIC INACTIVE GASTRITIS, MILD. - NO EVIDENCE OF HELICOBACTER PYLORI, DYSPLASIA, OR MALIGNANCY. - SEE COMMENT.  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   Cataract 11/2013   bilateral   Chronic renal insufficiency, stage III (moderate) (HCC) 11/27/2015   Chronic systolic heart failure (HCC)    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 02/24/2019   Coronary arteriosclerosis 07/06/2012   Decreased cardiac ejection fraction 05/20/2019   Depressed left ventricular ejection fraction 12/05/2020   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   Essential hypertension 07/06/2012   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   Gastroesophageal reflux disease without esophagitis 03/22/2018   GERD (gastroesophageal reflux disease)    HFrEF (heart failure with reduced  ejection fraction) (HCC) 11/23/2020   Hypercalcemia of malignancy 02/16/2021   Hyperkalemia 08/24/2019   Hyperlipidemia    Hyperphosphatemia 11/25/2019   Hypertension    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   Left bundle branch block 07/06/2012   Malignant  neoplasm of prostate (HCC)    Malignant neoplasm of prostate (HCC)    Metabolic bone disease 08/24/2019   Mixed hyperlipidemia 07/06/2012   Nonrheumatic mitral valve regurgitation 12/05/2020   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   Olecranon bursitis of left elbow 06/25/2018   Pre-diabetes 12/05/2020   Pulmonary hypertension (HCC) 12/05/2020   S/P radiation therapy > 12 wks ago 2010   prostate CA   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HCC)    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HCC) 12/05/2020   Tobacco use 12/05/2020   Vitamin D deficiency 08/24/2019   Vitamin D toxicity 07/24/2022    Surgical History:  He  has a past surgical history that includes left hip repaired; Cholecystectomy (01/2003); RIGHT/LEFT HEART CATH AND CORONARY ANGIOGRAPHY (N/A, 12/08/2020); TEE without cardioversion (N/A, 01/26/2021); and BIV ICD INSERTION CRT-D (N/A, 05/07/2021). Family History:  His family history includes Heart attack in his brother; Leukemia in his mother; Prostate cancer in his father. Social History:   reports that he has been smoking cigarettes. He has a 25 pack-year smoking history. He has never used smokeless tobacco. He reports that he does not currently use alcohol . He reports that he does not use drugs.  Prior to Admission medications   Medication Sig Start Date End Date Taking? Authorizing Provider  aspirin  EC 81 MG tablet Take 1 tablet (81 mg total) by mouth daily. 09/20/19  Yes Hilty, Vinie BROCKS, MD  Cetirizine HCl 10 MG CAPS Take 1 capsule by mouth daily.   Yes [provider]  dapagliflozin  propanediol (FARXIGA ) 10 MG TABS tablet Take 1 tablet (10 mg total) by mouth daily before breakfast. 05/28/23  Yes Milford, Harlene HERO, FNP  digoxin  (LANOXIN ) 0.125 MG tablet Take 0.5 tablets (0.0625 mg total) by mouth daily. 07/02/23  Yes Rolan Ezra RAMAN, MD  enzalutamide  (XTANDI ) 40 MG tablet Take 160 mg by mouth daily.   Yes [provider]  Evolocumab   (REPATHA  SURECLICK) 140 MG/ML SOAJ INJECT 1 DOSE UNDER THE SKIN EVERY 14 DAYS 01/15/23  Yes Hilty, Vinie BROCKS, MD  FISH OIL-KRILL OIL PO Take 500 mg by mouth daily.   Yes [provider]  metolazone  (ZAROXOLYN ) 2.5 MG tablet Take 1 tablet (2.5 mg total) by mouth as directed. Only take as directed by the advanced heart failure clinic 06/05/23 09/03/23 Yes Milford, Harlene HERO, FNP  metoprolol  succinate (TOPROL  XL) 25 MG 24 hr tablet Take 1 tablet (25 mg total) by mouth at bedtime. 06/05/23  Yes Milford, Harlene HERO, FNP  Multiple Vitamins-Minerals (PRESERVISION/LUTEIN) CAPS Take 1 capsule by mouth at bedtime.   Yes [provider]  omeprazole  (PRILOSEC) 40 MG capsule Take 1 capsule (40 mg total) by mouth daily. 08/19/23  Yes   ondansetron  (ZOFRAN -ODT) 4 MG disintegrating tablet Take 4 mg by mouth every 8 (eight) hours as needed. 08/29/23  Yes [provider]  potassium chloride  SA (KLOR-CON  M) 20 MEQ tablet Take 1 tablet (20 mEq total) by mouth daily. 05/28/23  Yes Milford, Harlene HERO, FNP  pregabalin  (LYRICA ) 100 MG capsule Take 100 mg by mouth 3 (three) times daily.   Yes [provider]  rosuvastatin  (CRESTOR ) 5 MG tablet Take 1 tablet (5 mg total) by mouth daily.  05/28/23  Yes Milford, Harlene HERO, FNP  spironolactone  (ALDACTONE ) 25 MG tablet Take 1 tablet (25 mg total) by mouth daily. 05/28/23  Yes Milford, Harlene HERO, FNP  tamsulosin  (FLOMAX ) 0.4 MG CAPS capsule Take 1 capsule (0.4 mg total) by mouth daily. 07/02/23  Yes Rolan Ezra RAMAN, MD  torsemide  (DEMADEX ) 20 MG tablet Take 4 tablets (80 mg total) by mouth in the morning AND 2 tablets (40 mg total) every evening. 05/28/23  Yes Milford, Harlene HERO, FNP  traZODone  (DESYREL ) 100 MG tablet Take 25 mg by mouth at bedtime as needed for sleep. 03/15/22  Yes [provider]  furosemide  (LASIX ) 20 MG tablet Take 20 mg by mouth daily. Patient not taking: Reported on 08/30/2023 08/29/23   [provider]    Current  Facility-Administered Medications  Medication Dose Route Frequency Provider Last Rate Last Admin   aspirin  EC tablet 81 mg  81 mg Oral Daily Cindy Garnette POUR, MD   81 mg at 08/30/23 1915   furosemide  (LASIX ) injection 60 mg  60 mg Intravenous Q12H Tobb, Kardie, DO   60 mg at 08/30/23 2301   heparin  ADULT infusion 100 units/mL (25000 units/250mL)  1,400 Units/hr Intravenous Continuous Salam, Savannah B, RPH 12 mL/hr at 08/31/23 0725 1,200 Units/hr at 08/31/23 0725   metoprolol  succinate (TOPROL -XL) 24 hr tablet 25 mg  25 mg Oral QHS Cindy Garnette POUR, MD   25 mg at 08/30/23 2104   pantoprazole  (PROTONIX ) EC tablet 40 mg  40 mg Oral Daily Cindy Garnette POUR, MD   40 mg at 08/30/23 1915   sodium zirconium cyclosilicate  (LOKELMA ) packet 10 g  10 g Oral BID Shona Laurence N, DO       tamsulosin  (FLOMAX ) capsule 0.4 mg  0.4 mg Oral Daily Cindy Garnette POUR, MD   0.4 mg at 08/30/23 1915    Allergies as of 08/30/2023 - Review Complete 08/30/2023  Allergen Reaction Noted   Ezetimibe  Other (See Comments) 07/28/2020   Gabapentin Itching and Other (See Comments) 02/17/2021   Statins Other (See Comments) 03/19/2018    Review of Systems  Constitutional:  Negative for chills, fever and weight loss.  HENT:  Negative for hearing loss and tinnitus.   Eyes:  Negative for blurred vision and double vision.  Respiratory:  Positive for shortness of breath. Negative for cough.   Cardiovascular:  Positive for leg swelling. Negative for chest pain.  Gastrointestinal:  Negative for abdominal pain, blood in stool, constipation, diarrhea, heartburn, melena, nausea and vomiting.  Genitourinary:  Negative for dysuria and urgency.  Musculoskeletal:  Negative for myalgias and neck pain.  Skin:  Negative for itching and rash.  Neurological:  Negative for dizziness and headaches.  Psychiatric/Behavioral:  Negative for depression and suicidal ideas.        Physical Exam:  Vital signs in last 24 hours: Temp:  [97.3 F (36.3  C)-98.3 F (36.8 C)] 98.2 F (36.8 C) (01/12 0827) Pulse Rate:  [70-74] 70 (01/12 0827) Resp:  [11-23] 18 (01/12 0827) BP: (104-126)/(37-92) 104/53 (01/12 0827) SpO2:  [87 %-100 %] 93 % (01/12 0827) Weight:  [80 kg-82.7 kg] 82.1 kg (01/12 0509) Last BM Date : 08/29/23 Last BM recorded by nurses in past 5 days No data recorded  Physical Exam Constitutional:      Appearance: He is ill-appearing.  HENT:     Head: Normocephalic and atraumatic.     Nose: Nose normal. No congestion.     Mouth/Throat:     Mouth: Mucous  membranes are dry.  Eyes:     General: Scleral icterus present.     Extraocular Movements: Extraocular movements intact.  Cardiovascular:     Rate and Rhythm: Normal rate and regular rhythm.  Pulmonary:     Effort: Pulmonary effort is normal.     Breath sounds: Normal breath sounds.  Abdominal:     General: Bowel sounds are normal. There is no distension.     Palpations: Abdomen is soft. There is no mass.     Tenderness: There is no abdominal tenderness. There is no guarding or rebound.     Hernia: No hernia is present.  Musculoskeletal:        General: Normal range of motion.     Cervical back: Normal range of motion and neck supple.  Skin:    General: Skin is warm and dry.     Coloration: Skin is jaundiced.  Neurological:     General: No focal deficit present.     Mental Status: He is oriented to person, place, and time.  Psychiatric:        Mood and Affect: Mood normal.        Thought Content: Thought content normal.        Judgment: Judgment normal.      LAB RESULTS: Recent Labs    08/30/23 1144 08/31/23 0222 08/31/23 0522  WBC 11.5* 12.2* 10.5  HGB 11.5* 10.7* 10.8*  HCT 37.7* 35.5* 35.0*  PLT 166 88* 85*   BMET Recent Labs    08/30/23 1144 08/30/23 2259 08/31/23 0222  NA 135 134* 134*  K 5.3* 5.5* 5.3*  CL 96* 95* 95*  CO2 16* 14* 11*  GLUCOSE 94 67* 73  BUN 77* 89* 94*  CREATININE 4.10* 4.50* 4.48*  CALCIUM  8.6* 8.1* 7.8*    LFT Recent Labs    08/30/23 2259  PROT 5.7*  ALBUMIN 3.4*  AST 3,596*  ALT 3,201*  ALKPHOS 150*  BILITOT 7.0*   PT/INR Recent Labs    08/30/23 1605  LABPROT 26.3*  INR 2.4*    STUDIES: US  Abdomen Limited RUQ (LIVER/GB) Result Date: 08/31/2023 CLINICAL DATA:  History of cholecystectomy presenting with transaminitis. EXAM: ULTRASOUND ABDOMEN LIMITED RIGHT UPPER QUADRANT COMPARISON:  None Available. FINDINGS: Gallbladder: The gallbladder is surgically absent. Common bile duct: Diameter: 5.5 mm Liver: No focal lesion identified. Mild, diffusely increased echogenicity of the liver parenchyma is noted. Portal vein is patent on color Doppler imaging with normal direction of blood flow towards the liver. Other: Of incidental note is the presence of a right pleural effusion. A trace amount of ascites is also seen. IMPRESSION: 1. Findings consistent with history of prior cholecystectomy. 2. Hepatic steatosis without focal liver lesions. 3. Right pleural effusion. 4. Trace ascites. Electronically Signed   By: Suzen Dials M.D.   On: 08/31/2023 03:26   CT ABDOMEN PELVIS WO CONTRAST Result Date: 08/30/2023 CLINICAL DATA:  Nausea and vomiting for the past 2 days. Shortness of breath. EXAM: CT ABDOMEN AND PELVIS WITHOUT CONTRAST TECHNIQUE: Multidetector CT imaging of the abdomen and pelvis was performed following the standard protocol without IV contrast. RADIATION DOSE REDUCTION: This exam was performed according to the departmental dose-optimization program which includes automated exposure control, adjustment of the mA and/or kV according to patient size and/or use of iterative reconstruction technique. COMPARISON:  Right upper quadrant ultrasound dated January 25, 2021. FINDINGS: Lower chest: Cardiomegaly. Small right pleural effusion with adjacent right lower lobe atelectasis. Hepatobiliary: No focal liver abnormality. Status post  cholecystectomy. No biliary dilatation. Pancreas: Unremarkable. No  pancreatic ductal dilatation or surrounding inflammatory changes. Spleen: Normal in size without focal abnormality. Adrenals/Urinary Tract: Adrenal glands are unremarkable. Small bilateral renal simple cysts. No follow-up imaging is recommended. Punctate calculus in the upper pole of the right kidney. No hydronephrosis. Bladder is unremarkable. Stomach/Bowel: Stomach is within normal limits. Appendix appears normal. No evidence of bowel wall thickening, distention, or inflammatory changes. Vascular/Lymphatic: Aortic atherosclerosis. No enlarged abdominal or pelvic lymph nodes. Reproductive: Brachytherapy seeds within the prostate gland. Other: Small perihepatic ascites.  No pneumoperitoneum. Musculoskeletal: No acute or significant osseous findings. Prior left total hip arthroplasty. IMPRESSION: 1. No acute intra-abdominal process. 2. Small perihepatic ascites. 3. Cardiomegaly with small right pleural effusion. 4. Punctate nonobstructive right nephrolithiasis. 5.  Aortic Atherosclerosis (ICD10-I70.0). Electronically Signed   By: Elsie ONEIDA Shoulder M.D.   On: 08/30/2023 14:45   DG Chest Port 1 View Result Date: 08/30/2023 CLINICAL DATA:  Short of breath EXAM: PORTABLE CHEST 1 VIEW COMPARISON:  04/30/2021 FINDINGS: LEFT-sided pacer overlies normal cardiac silhouette. No effusion, infiltrate or pneumothorax. IMPRESSION: No acute cardiopulmonary process. Electronically Signed   By: Jackquline Boxer M.D.   On: 08/30/2023 11:37      Impression    Elevated LFTs CT abdomen pelvis without contrast with small perihepatic ascites, no focal liver abnormality.  Cardiomegaly with small right pleural effusion RUQ US  with hepatic steatosis without lesions, trace ascites, s/p cholecystectomy.  Portal vein patent on color Doppler imaging with normal direction of blood flow Leukocytosis with WBC 12.2 Hgb 10.7, platelets 88 AST 3596, ALT 10/19/1999, alk phos 150, T. bili 7.0, worsening Negative hepatitis panel ASMA/ANA and  ceruloplasmin pending Negative acetaminophen  Normal iron studies PT 26.3, INR 2.4 Suspect possible congestive hepatopathy in the setting of acute CHF exacerbation with CT showing pleural effusion small perihepatic ascites with no focal liver abnormality and exponentially elevated BNP and peak troponin.  Difficult situation with severe AKI on CKD resulting in avoidance of ACE/BB and difficult to diurese.  DDX also includes shock liver.  Portal vein patent on color Doppler imaging during RUQ ultrasound.. No hepatitis. Tylenol  use but negative acetaminophen  level  CHF exacerbation Elevated troponin NSTEMI BNP 3487 - Cardiology following.  Receiving IV Lasix . Started on heparin  drip  Acute on chronic CKD Cr 4.10 (baseline 2), BUN 77, GFR 13 - Nephrology following  Hyperkalemia Initial potassium 5.3    Plan   - Continue to trend LFTs - Appreciate cardiology, nephrology recommendations - Recheck PT/INR and hepatic panel - Await pending studies  Thank you for your kind consultation, we will continue to follow.   Bayley CHRISTELLA Blower  08/31/2023, 9:37 AM    Attending physician's note  I have taken a history, reviewed the chart and examined the patient. I performed a substantive portion of this encounter, including complete performance of at least one of the key components, in conjunction with the APP. I agree with the APP's note, impression and recommendations.    83 year old male with severe CHF, EF 25 to 30%, CKD admitted with shortness of breath, AKI, NSTEMI and LFT abnormality concerning for shock liver  Prior to admission patient was having nausea, vomiting and diarrhea, no longer has diarrhea or vomiting but continues to have nausea.  LFT abnormalities likely secondary to congestive hepatopathy and shock liver with decreased perfusion Continue to monitor daily LFT, PT and INR Continue supportive care  Family was asking regarding his outcome, may be appropriate to have goals of  care discussion and  palliative care consult GI will continue to follow along  The patient and family at bedside were provided an opportunity to ask questions and all were answered. They agreed with the plan and demonstrated an understanding of the instructions.  LOIS Wilkie Mcgee , MD [phone]

## 2023-08-31 NOTE — Assessment & Plan Note (Addendum)
 Known to have coronary artery disease.  Currently with no chest pain.  Transitioned from IV to SQ  heparin.  Echocardiogram with non focal wall motion abnormalities.

## 2023-08-31 NOTE — Progress Notes (Signed)
 [facility] Progress Note  Subjective: seen in room, more alert this am, Ox 3.   Vitals:   08/30/23 2104 08/31/23 0024 08/31/23 0509 08/31/23 0827  BP: (!) 121/46 (!) 115/41 (!) 112/92 (!) 104/53  Pulse: 74 73 72 70  Resp:  20 20 18   Temp:  97.7 F (36.5 C) (!) 97.3 F (36.3 C) 98.2 F (36.8 C)  TempSrc:  Oral Oral Oral  SpO2:  98% 100% 93%  Weight:   82.1 kg   Height:        Exam: Gen elderly male, flat in bed, more alert , Ox 3 Sclera anicteric, throat clear  No jvd or bruits Chest clear bilat to bases RRR no RG Abd soft ntnd no mass or ascites +bs Ext no LE or other edema Neuro is alert, Ox 3 , nf      Home meds: - toprol  xl 25, aldactone  25 daily/ torsemide  80 am+ 40 pm/ metolazone  2.5mg  ut dict - farxiga  10 every day - klor-con  20 qd - others: enzalutamide  160 daily, repatha  q 14d, MVI, klor-con , lyrica , trazodone ,crestor , asa, digoxin , PPI, flomax     Date                             Creat               eGFR (ml/min) 2020                            1.81- 2.87         2021                            1.51- 1.70 2022                            1.63- 2.20 2023                            2.03- 3.31        18- 33 ml/min May- dec 2024            1.87- 2.19        30-36 ml/min    08/30/23                                    4.10                 14 ml/min                              UA 1/12 - prot 30, 0-5 rbc/ wbc/ epis    UNa < 10, UCr 108     CT abd/pelv IMPRESSION: Urinary Tract: No hydronephrosis. Bladder is unremarkable.     Assessment/ Plan: AKI on CKD 3b - b/l creatinine 1.8- 2.2 from may-dec 2024, eGFR 30-36 ml/min. Creat here is 4.1 on admission in the setting of SOB and h/o HFrEF w/ low EF 25-30%. CT showing normal kidneys w/o obstruction. UA negative. CXR clear. Urine lytes c/w prerenal. On exam euvolemic possibly dry. High BNP and trops. AKI cause not clear. Not improving w/ IV lasix . Doesn't look vol overloaded on exam  and may be a bit dry.  Would consider holding lasix  and starting bicarb IVF's, will d/w Cards/ pmd.  Hyperkalemia - mild, will change to renal diet and give lokelma  10 gm.  ^^LFT's - AST/ ALT in thousands and Tbili 5.8 on admission. Per GI/ pmd HFrEF - last echo dec 2024 w/ EF of 20-25%.  H/o severe pHTN Marlise - started on IV heparin , Cards following      Myer Fret MD  [facility] 08/31/2023, 10:54 AM  Recent Labs  Lab 08/30/23 1144 08/30/23 2259 08/31/23 0222 08/31/23 0522  HGB 11.5*  --  10.7* 10.8*  ALBUMIN 3.5 3.4*  --   --   CALCIUM  8.6* 8.1* 7.8*  --   CREATININE 4.10* 4.50* 4.48*  --   K 5.3* 5.5* 5.3*  --    Recent Labs  Lab 08/31/23 0522  IRON 56  TIBC 448  FERRITIN 119   Inpatient medications:  aspirin  EC  81 mg Oral Daily   furosemide   60 mg Intravenous Q12H   metoprolol  succinate  25 mg Oral QHS   pantoprazole   40 mg Oral Daily   sodium zirconium cyclosilicate   10 g Oral BID   tamsulosin   0.4 mg Oral Daily    heparin  1,400 Units/hr (08/31/23 1042)   perflutren  lipid microspheres (DEFINITY ) IV suspension

## 2023-08-31 NOTE — Hospital Course (Addendum)
 Tommy Ward was admitted to the hospital with the working diagnosis of heart failure decompensation complicated with NSTEMI and multiorgan failure, cardiogenic shock.    83 y.o. 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 In ED, pt was found to have BNP of 3686 with trop of 3487. Cr was elevated at 4.10 with K of 5.3. AST was also elevated at 3591 with ALT 2873 with TB of 5.8 and alk phos of 147. CXR was notable for cardiomegaly. Abd CT with mild ascites, otherwise liver appeared unremarkable. No hydronephrosis and bladder appeared unremarkable. Pt was started on heparin  gtt and IV furosemide .   01/13 echocardiogram with reduced LV systolic function to less than 20% with signs of hypoperfusion. Transferred to ICU for inotropic support.  Not candidate for advance therapies or hemodialysis. Poor prognosis, CODE status changed to DNR.  01/14 stable on dobutamine  infusion and aggressive diuresis. Consulted palliative care services.

## 2023-09-01 ENCOUNTER — Other Ambulatory Visit: Payer: Self-pay

## 2023-09-01 DIAGNOSIS — D509 Iron deficiency anemia, unspecified: Secondary | ICD-10-CM | POA: Diagnosis not present

## 2023-09-01 DIAGNOSIS — I214 Non-ST elevation (NSTEMI) myocardial infarction: Secondary | ICD-10-CM | POA: Diagnosis not present

## 2023-09-01 DIAGNOSIS — K7200 Acute and subacute hepatic failure without coma: Secondary | ICD-10-CM | POA: Diagnosis not present

## 2023-09-01 DIAGNOSIS — R570 Cardiogenic shock: Secondary | ICD-10-CM

## 2023-09-01 DIAGNOSIS — C61 Malignant neoplasm of prostate: Secondary | ICD-10-CM

## 2023-09-01 DIAGNOSIS — I5023 Acute on chronic systolic (congestive) heart failure: Secondary | ICD-10-CM | POA: Diagnosis not present

## 2023-09-01 DIAGNOSIS — R7989 Other specified abnormal findings of blood chemistry: Secondary | ICD-10-CM | POA: Diagnosis not present

## 2023-09-01 DIAGNOSIS — N184 Chronic kidney disease, stage 4 (severe): Secondary | ICD-10-CM | POA: Diagnosis not present

## 2023-09-01 LAB — CBC
HCT: 34 % — ABNORMAL LOW (ref 39.0–52.0)
Hemoglobin: 10.8 g/dL — ABNORMAL LOW (ref 13.0–17.0)
MCH: 28.1 pg (ref 26.0–34.0)
MCHC: 31.8 g/dL (ref 30.0–36.0)
MCV: 88.3 fL (ref 80.0–100.0)
Platelets: 65 10*3/uL — ABNORMAL LOW (ref 150–400)
RBC: 3.85 MIL/uL — ABNORMAL LOW (ref 4.22–5.81)
RDW: 17.8 % — ABNORMAL HIGH (ref 11.5–15.5)
WBC: 10 10*3/uL (ref 4.0–10.5)
nRBC: 3.9 % — ABNORMAL HIGH (ref 0.0–0.2)

## 2023-09-01 LAB — BASIC METABOLIC PANEL
Anion gap: 27 — ABNORMAL HIGH (ref 5–15)
BUN: 106 mg/dL — ABNORMAL HIGH (ref 8–23)
CO2: 18 mmol/L — ABNORMAL LOW (ref 22–32)
Calcium: 7.2 mg/dL — ABNORMAL LOW (ref 8.9–10.3)
Chloride: 88 mmol/L — ABNORMAL LOW (ref 98–111)
Creatinine, Ser: 4.73 mg/dL — ABNORMAL HIGH (ref 0.61–1.24)
GFR, Estimated: 12 mL/min — ABNORMAL LOW (ref >60)
Glucose, Bld: 96 mg/dL (ref 70–99)
Potassium: 4.3 mmol/L (ref 3.5–5.1)
Sodium: 133 mmol/L — ABNORMAL LOW (ref 135–145)

## 2023-09-01 LAB — ANTI-SMOOTH MUSCLE ANTIBODY, IGG: F-Actin IgG: 3 U (ref 0–19)

## 2023-09-01 LAB — LACTIC ACID, PLASMA
Lactic Acid, Venous: 4.8 mmol/L (ref 0.5–1.9)
Lactic Acid, Venous: 4.1 mmol/L (ref 0.5–1.9)

## 2023-09-01 LAB — HEPATIC FUNCTION PANEL
ALT: 3386 U/L — ABNORMAL HIGH (ref 0–44)
AST: 3190 U/L — ABNORMAL HIGH (ref 15–41)
Albumin: 3.2 g/dL — ABNORMAL LOW (ref 3.5–5.0)
Alkaline Phosphatase: 183 U/L — ABNORMAL HIGH (ref 38–126)
Bilirubin, Direct: 3.7 mg/dL — ABNORMAL HIGH (ref 0.0–0.2)
Indirect Bilirubin: 3.5 mg/dL — ABNORMAL HIGH (ref 0.3–0.9)
Total Bilirubin: 7.2 mg/dL — ABNORMAL HIGH (ref 0.0–1.2)
Total Protein: 5.4 g/dL — ABNORMAL LOW (ref 6.5–8.1)

## 2023-09-01 LAB — MRSA NEXT GEN BY PCR, NASAL: MRSA by PCR Next Gen: NOT DETECTED

## 2023-09-01 LAB — CERULOPLASMIN: Ceruloplasmin: 34.1 mg/dL — ABNORMAL HIGH (ref 16.0–31.0)

## 2023-09-01 LAB — CG4 I-STAT (LACTIC ACID): Lactic Acid, Venous: 2 mmol/L (ref 0.5–1.9)

## 2023-09-01 LAB — APTT: aPTT: 77 s — ABNORMAL HIGH (ref 24–36)

## 2023-09-01 LAB — HEPARIN LEVEL (UNFRACTIONATED): Heparin Unfractionated: 0.22 [IU]/mL — ABNORMAL LOW (ref 0.30–0.70)

## 2023-09-01 MED ORDER — SODIUM CHLORIDE 0.9% FLUSH
10.0000 mL | Freq: Two times a day (BID) | INTRAVENOUS | Status: DC
  Administered 2023-09-01 – 2023-09-03 (×4): 10 mL
  Administered 2023-09-04: 30 mL
  Administered 2023-09-05: 10 mL

## 2023-09-01 MED ORDER — SODIUM CHLORIDE 0.9% FLUSH: 10.0000 mL | INTRAVENOUS | Status: DC | PRN

## 2023-09-01 MED ORDER — FUROSEMIDE 10 MG/ML IJ SOLN
80.0000 mg | Freq: Three times a day (TID) | INTRAMUSCULAR | Status: DC
  Administered 2023-09-01 – 2023-09-05 (×12): 80 mg via INTRAVENOUS
  Filled 2023-09-01 (×12): qty 8

## 2023-09-01 MED ORDER — HEPARIN SODIUM (PORCINE) 5000 UNIT/ML IJ SOLN
5000.0000 [IU] | Freq: Three times a day (TID) | INTRAMUSCULAR | Status: DC
  Filled 2023-09-01: qty 1

## 2023-09-01 MED ORDER — DOBUTAMINE-DEXTROSE 4-5 MG/ML-% IV SOLN
2.5000 ug/kg/min | INTRAVENOUS | Status: DC
  Administered 2023-09-01 – 2023-09-04 (×2): 2.5 ug/kg/min via INTRAVENOUS
  Filled 2023-09-01 (×2): qty 250

## 2023-09-01 MED ORDER — CHLORHEXIDINE GLUCONATE CLOTH 2 % EX PADS
6.0000 | MEDICATED_PAD | Freq: Every day | CUTANEOUS | Status: DC
  Administered 2023-09-01 – 2023-09-05 (×4): 6 via TOPICAL

## 2023-09-01 MED ORDER — ORAL CARE MOUTH RINSE
15.0000 mL | OROMUCOSAL | Status: DC
  Administered 2023-09-01: 15 mL via OROMUCOSAL

## 2023-09-01 MED ORDER — BIVALIRUDIN TRIFLUOROACETATE 250 MG IV SOLR
0.0500 mg/kg/h | INTRAVENOUS | Status: DC
  Administered 2023-09-01: 0.05 mg/kg/h via INTRAVENOUS
  Filled 2023-09-01: qty 250

## 2023-09-01 MED ORDER — ORAL CARE MOUTH RINSE: 15.0000 mL | OROMUCOSAL | Status: DC | PRN

## 2023-09-01 NOTE — Progress Notes (Signed)
 Gastroenterology Inpatient Follow Up    Subjective: Patient was sitting in his chair. Swelling in his legs is not bad. Denies ab pain. He is A&Ox3.  Patient states that he is passing stools.  Objective: Vital signs in last 24 hours: Temp:  [97.4 F (36.3 C)-98.1 F (36.7 C)] 97.5 F (36.4 C) (01/13 0928) Pulse Rate:  [64-70] 69 (01/13 0928) Resp:  [18-20] 18 (01/13 0928) BP: (102-113)/(58-62) 113/59 (01/13 0928) SpO2:  [95 %-100 %] 98 % (01/13 0928) Weight:  [85.5 kg] 85.5 kg (01/13 0443) Last BM Date : 08/29/23  Intake/Output from previous day: 01/12 0701 - 01/13 0700 In: 600 [P.O.:600] Out: 500 [Urine:500] Intake/Output this shift: No intake/output data recorded.  General appearance: alert and cooperative Resp: no increased WOB Cardio: regular rate GI: non-tender, non-distended Extremities: 1+ BLE edema  Lab Results: Recent Labs    08/31/23 0222 08/31/23 0522 09/01/23 0250  WBC 12.2* 10.5 10.0  HGB 10.7* 10.8* 10.8*  HCT 35.5* 35.0* 34.0*  PLT 88* 85* 65*   BMET Recent Labs    08/30/23 2259 08/31/23 0222 09/01/23 0250  NA 134* 134* 133*  K 5.5* 5.3* 4.3  CL 95* 95* 88*  CO2 14* 11* 18*  GLUCOSE 67* 73 96  BUN 89* 94* 106*  CREATININE 4.50* 4.48* 4.73*  CALCIUM  8.1* 7.8* 7.2*   LFT Recent Labs    09/01/23 0939  PROT 5.4*  ALBUMIN 3.2*  AST 3,190*  ALT 3,386*  ALKPHOS 183*  BILITOT 7.2*  BILIDIR 3.7*  IBILI 3.5*   PT/INR Recent Labs    08/30/23 1605 08/31/23 1029  LABPROT 26.3* 34.3*  INR 2.4* 3.4*   Hepatitis Panel Recent Labs    08/30/23 1355  HEPBSAG NON REACTIVE  HCVAB NON REACTIVE  HEPAIGM NON REACTIVE  HEPBIGM NON REACTIVE   C-Diff No results for input(s): CDIFFTOX in the last 72 hours.  Studies/Results: US  EKG SITE RITE Result Date: 09/01/2023 If Site Rite image not attached, placement could not be confirmed due to current cardiac rhythm.  ECHOCARDIOGRAM COMPLETE Result Date: 08/31/2023    ECHOCARDIOGRAM  REPORT   Patient Name:   Tommy Ward [facility] Date of Exam: 08/31/2023 Medical Rec #:  [id number]         Height:       69.0 in Accession #:    [id number]        Weight:       180.9 lb Date of Birth:  08/27/1940          BSA:          1.980 m Patient Age:    82 years          BP:           104/53 mmHg Patient Gender: M                 HR:           70 bpm. Exam Location:  Inpatient Procedure: 2D Echo, 3D Echo, Cardiac Doppler, Color Doppler, Strain Analysis and            Intracardiac Opacification Agent Indications:    CHF  History:        Patient has prior history of Echocardiogram examinations, most                 recent 08/06/2023. Cardiomyopathy, CHF and HFrEF, NSTEMI and                 CAD, Pulmonary  HTN, H/O Cancer and Carotid Disease; Risk                 Factors:Hypertension, Dyslipidemia and Current Smoker.  Sonographer:    Tillman Nora RVT RCS Referring Phys: KARDIE TOBB IMPRESSIONS  1. Left ventricular ejection fraction, by estimation, is <20%. The left ventricle has severely decreased function. The left ventricle demonstrates global hypokinesis. The left ventricular internal cavity size was severely dilated.  2. Right ventricular systolic function mild to moderately reduced. The right ventricular size is mildly enlarged.  3. Left atrial size was moderately dilated.  4. Right atrial size was severely dilated.  5. Functional MR, at least moderate in severity . The mitral valve is normal in structure.  6. Tricuspid valve regurgitation is severe.  7. The aortic valve is tricuspid. Aortic valve regurgitation is not visualized. Aortic valve sclerosis/calcification is present, without any evidence of aortic stenosis.  8. The inferior vena cava is dilated in size with <50% respiratory variability, suggesting right atrial pressure of 15 mmHg. Comparison(s): EF 25-30%. FINDINGS  Left Ventricle: Left ventricular ejection fraction, by estimation, is <20%. The left ventricle has severely decreased function. The left  ventricle demonstrates global hypokinesis. Definity  contrast agent was given IV to delineate the left ventricular endocardial borders. The left ventricular internal cavity size was severely dilated. There is no left ventricular hypertrophy. Right Ventricle: The right ventricular size is mildly enlarged. Right vetricular wall thickness was not assessed. Right ventricular systolic function mild to moderately reduced. Left Atrium: Left atrial size was moderately dilated. Right Atrium: Right atrial size was severely dilated. Pericardium: There is no evidence of pericardial effusion. Mitral Valve: Functional MR, at least moderate in severity. The mitral valve is normal in structure. Tricuspid Valve: The tricuspid valve is normal in structure. Tricuspid valve regurgitation is severe. Aortic Valve: The aortic valve is tricuspid. Aortic valve regurgitation is not visualized. Aortic valve sclerosis/calcification is present, without any evidence of aortic stenosis. Aortic valve mean gradient measures 3.0 mmHg. Aortic valve peak gradient measures 5.2 mmHg. Aortic valve area, by VTI measures 2.31 cm. Pulmonic Valve: The pulmonic valve was normal in structure. Pulmonic valve regurgitation is trivial. Aorta: The aortic root is normal in size and structure and the aortic root and ascending aorta are structurally normal, with no evidence of dilitation. Venous: The inferior vena cava is dilated in size with less than 50% respiratory variability, suggesting right atrial pressure of 15 mmHg. IAS/Shunts: No atrial level shunt detected by color flow Doppler. Additional Comments: A device lead is visualized.  LEFT VENTRICLE PLAX 2D LVIDd:         6.47 cm LVIDs:         6.10 cm LV PW:         1.07 cm LV IVS:        1.03 cm LVOT diam:     2.20 cm   3D Volume EF: LV SV:         50        3D EF:        25 % LV SV Index:   25        LV EDV:       356 ml LVOT Area:     3.80 cm  LV ESV:       268 ml                          LV SV:  88  ml RIGHT VENTRICLE             IVC RV Basal diam:  4.40 cm     IVC diam: 2.70 cm RV S prime:     12.20 cm/s TAPSE (M-mode): 1.6 cm LEFT ATRIUM             Index        RIGHT ATRIUM           Index LA diam:        4.60 cm 2.32 cm/m   RA Area:     28.80 cm LA Vol (A2C):   93.1 ml 47.03 ml/m  RA Volume:   98.70 ml  49.86 ml/m LA Vol (A4C):   75.3 ml 38.04 ml/m LA Biplane Vol: 96.1 ml 48.54 ml/m  AORTIC VALVE                    PULMONIC VALVE AV Area (Vmax):    2.38 cm     PR End Diast Vel: 0.92 msec AV Area (Vmean):   2.22 cm AV Area (VTI):     2.31 cm AV Vmax:           114.00 cm/s AV Vmean:          74.600 cm/s AV VTI:            0.216 m AV Peak Grad:      5.2 mmHg AV Mean Grad:      3.0 mmHg LVOT Vmax:         71.30 cm/s LVOT Vmean:        43.500 cm/s LVOT VTI:          0.131 m LVOT/AV VTI ratio: 0.61  AORTA Ao Root diam: 2.60 cm Ao Asc diam:  3.20 cm MITRAL VALVE               TRICUSPID VALVE MV Area (PHT): 4.39 cm    TR Peak grad:   23.2 mmHg MV Decel Time: 173 msec    TR Vmax:        241.00 cm/s MV E velocity: 41.40 cm/s MV A velocity: 66.60 cm/s  SHUNTS MV E/A ratio:  0.62        Systemic VTI:  0.13 m                            Systemic Diam: 2.20 cm Vina Gull MD Electronically signed by Vina Gull MD Signature Date/Time: 08/31/2023/3:07:15 PM    Final    US  Abdomen Limited RUQ (LIVER/GB) Result Date: 08/31/2023 CLINICAL DATA:  History of cholecystectomy presenting with transaminitis. EXAM: ULTRASOUND ABDOMEN LIMITED RIGHT UPPER QUADRANT COMPARISON:  None Available. FINDINGS: Gallbladder: The gallbladder is surgically absent. Common bile duct: Diameter: 5.5 mm Liver: No focal lesion identified. Mild, diffusely increased echogenicity of the liver parenchyma is noted. Portal vein is patent on color Doppler imaging with normal direction of blood flow towards the liver. Other: Of incidental note is the presence of a right pleural effusion. A trace amount of ascites is also seen. IMPRESSION: 1. Findings  consistent with history of prior cholecystectomy. 2. Hepatic steatosis without focal liver lesions. 3. Right pleural effusion. 4. Trace ascites. Electronically Signed   By: Suzen Dials M.D.   On: 08/31/2023 03:26   CT ABDOMEN PELVIS WO CONTRAST Result Date: 08/30/2023 CLINICAL DATA:  Nausea and vomiting for the past 2 days. Shortness of breath. EXAM: CT  ABDOMEN AND PELVIS WITHOUT CONTRAST TECHNIQUE: Multidetector CT imaging of the abdomen and pelvis was performed following the standard protocol without IV contrast. RADIATION DOSE REDUCTION: This exam was performed according to the departmental dose-optimization program which includes automated exposure control, adjustment of the mA and/or kV according to patient size and/or use of iterative reconstruction technique. COMPARISON:  Right upper quadrant ultrasound dated January 25, 2021. FINDINGS: Lower chest: Cardiomegaly. Small right pleural effusion with adjacent right lower lobe atelectasis. Hepatobiliary: No focal liver abnormality. Status post cholecystectomy. No biliary dilatation. Pancreas: Unremarkable. No pancreatic ductal dilatation or surrounding inflammatory changes. Spleen: Normal in size without focal abnormality. Adrenals/Urinary Tract: Adrenal glands are unremarkable. Small bilateral renal simple cysts. No follow-up imaging is recommended. Punctate calculus in the upper pole of the right kidney. No hydronephrosis. Bladder is unremarkable. Stomach/Bowel: Stomach is within normal limits. Appendix appears normal. No evidence of bowel wall thickening, distention, or inflammatory changes. Vascular/Lymphatic: Aortic atherosclerosis. No enlarged abdominal or pelvic lymph nodes. Reproductive: Brachytherapy seeds within the prostate gland. Other: Small perihepatic ascites.  No pneumoperitoneum. Musculoskeletal: No acute or significant osseous findings. Prior left total hip arthroplasty. IMPRESSION: 1. No acute intra-abdominal process. 2. Small perihepatic  ascites. 3. Cardiomegaly with small right pleural effusion. 4. Punctate nonobstructive right nephrolithiasis. 5.  Aortic Atherosclerosis (ICD10-I70.0). Electronically Signed   By: Elsie ONEIDA Shoulder M.D.   On: 08/30/2023 14:45    Medications: I have reviewed the patient's current medications. Scheduled:  aspirin  EC  81 mg Oral Daily   pantoprazole   40 mg Oral Daily   tamsulosin   0.4 mg Oral Daily   Continuous:  heparin  1,450 Units/hr (09/01/23 0441)   PRN:  Assessment/Plan: 83 y.o. 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  Patient's TTE yesterday shows that his EF is less than 20%.  Patient's acute hepatitis panel and Tylenol  level were negative.  Iron studies suggest slight iron deficiency.  CK is mildly elevated at 506.  Patient's ultrasound did not show any clear signs of cirrhosis and portal vein flow is patent with normal doppler blood flow.  Patient's AST and ALT are now starting to downtrend.  I suspect that his other LFTs should also downtrend over time.  Patient states that he is feeling better overall. We will continue to monitor.  Mental status is okay today.  Patient is alert and oriented x 3.  -Follow up results of ceruloplasmin, ASMA, ANA -Continue to trend LFTs and INR over time   LOS: 2 days   Rosario JAYSON Kidney 09/01/2023, 11:42 AM

## 2023-09-01 NOTE — Progress Notes (Addendum)
 PHARMACY - ANTICOAGULATION CONSULT NOTE  Pharmacy Consult for heparin  Indication: chest pain/ACS  Allergies  Allergen Reactions   Ezetimibe  Other (See Comments)    Myalgia   Gabapentin Itching and Other (See Comments)    Sore throat, breakouts   Statins Other (See Comments)    Myalgias (intolerance)    Patient Measurements: Height: 5' 9 (175.3 cm) Weight: 85.5 kg (188 lb 8 oz) IBW/kg (Calculated) : 70.7 Heparin  Dosing Weight: TBW  Vital Signs: Temp: 97.9 F (36.6 C) (01/13 1551) Temp Source: Oral (01/13 1551) BP: 122/50 (01/13 1409) Pulse Rate: 69 (01/13 0928)  Labs: Recent Labs    08/30/23 1144 08/30/23 1605 08/30/23 2101 08/30/23 2259 08/31/23 0222 08/31/23 0522 08/31/23 0718 08/31/23 1029 08/31/23 1745 09/01/23 0250  HGB 11.5*  --   --   --  10.7* 10.8*  --   --   --  10.8*  HCT 37.7*  --   --   --  35.5* 35.0*  --   --   --  34.0*  PLT 166  --   --   --  88* 85*  --   --   --  65*  APTT  --   --   --   --   --   --   --   --  65* 77*  LABPROT  --  26.3*  --   --   --   --   --  34.3*  --   --   INR  --  2.4*  --   --   --   --   --  3.4*  --   --   HEPARINUNFRC  --   --    < >  --  0.11*  --   --   --  0.16* 0.22*  CREATININE 4.10*  --   --  4.50* 4.48*  --   --   --   --  4.73*  CKTOTAL  --   --   --   --   --  506*  --   --   --   --   TROPONINIHS 3,487*  --   --   --   --  6,446* 3,733*  --   --   --    < > = values in this interval not displayed.    Estimated Creatinine Clearance: 13.3 mL/min (A) (by C-G formula based on SCr of 4.73 mg/dL (H)).   Medical History: Past Medical History:  Diagnosis Date   Abnormal stress test 12/05/2020   AKI (acute kidney injury) (HCC) 11/25/2019   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   Aortic valve disorder 07/08/2014   B12 deficiency 03/22/2018   Benign hypertension with chronic kidney disease, stage III (HCC) 05/20/2019   Benign hypertension with CKD (chronic kidney disease) stage  IV (HCC) 05/20/2019   CAD (coronary artery disease) 11/23/2020   Cancer (HCC) 08/2008   prostate ca   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   Cataract 11/2013   bilateral   Chronic renal insufficiency, stage III (moderate) (HCC) 11/27/2015   Chronic systolic heart failure (HCC)    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 02/24/2019   Coronary arteriosclerosis 07/06/2012   Decreased cardiac ejection fraction 05/20/2019   Depressed left ventricular ejection fraction 12/05/2020   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   Essential hypertension 07/06/2012   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   Gastroesophageal reflux disease without esophagitis 03/22/2018   GERD (gastroesophageal reflux disease)  HFrEF (heart failure with reduced ejection fraction) (HCC) 11/23/2020   Hypercalcemia of malignancy 02/16/2021   Hyperkalemia 08/24/2019   Hyperlipidemia    Hyperphosphatemia 11/25/2019   Hypertension    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   Left bundle branch block 07/06/2012   Malignant neoplasm of prostate (HCC)    Malignant neoplasm of prostate (HCC)    Metabolic bone disease 08/24/2019   Mixed hyperlipidemia 07/06/2012   Nonrheumatic mitral valve regurgitation 12/05/2020   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   Olecranon bursitis of left elbow 06/25/2018   Pre-diabetes 12/05/2020   Pulmonary hypertension (HCC) 12/05/2020   S/P radiation therapy > 12 wks ago 2010   prostate CA   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HCC)    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HCC) 12/05/2020   Tobacco use 12/05/2020   Vitamin D deficiency 08/24/2019   Vitamin D toxicity 07/24/2022    Assessment: 82 YOM presenting with SOB, elevated troponin, he is not on anticoagulation PTA. Pharmacy Consulted for heparin  management.   Heparin  level 0.22 < goal but can be falsely low in setting of elevated Tbili - aptt 77 seconds (within  range) No bleeding noted   Has completed 48 hrs of heparin .  Goal of Therapy:  Heparin  level 0.3-0.7 units/ml Monitor platelets by anticoagulation protocol: Yes  Plan:  Discussed with HF team, okay to stop heparin  since has had > 48 hrs.   Harlene Barlow, Berdine JONETTA [employer], BCCP Clinical Pharmacist  09/01/2023 4:18 PM   [facility] pharmacy phone numbers are listed on [email or web address]

## 2023-09-01 NOTE — TOC Initial Note (Signed)
 Transition of Care [facility]) - Initial/Assessment Note    Patient Details  Name: Tommy Ward MRN: [id number] Date of Birth: February 05, 1941  [facility]) CM/SW Contact:    Arlana JINNY Nicholaus [place]RA[place] Phone Number: [phone] 09/01/2023, 12:33 PM  Clinical Narrative:  HF CSW and HF NCM met with pt at bedside. Pt was accompanied by his wife, son, brother, and daughter in law. Pts wife stated that they live alone. Pts wife stated that the pt has history of HH services. Pt has a nurse come to the home to assist with meds with paramedicine. Pts wife stated that he has a scale at home. Pts wife stated that he does not use any equipment. CSW explained that a hospital follow up appointment will be scheduled closer to dc. Pt and wife agree.   TOC will continue following.                    Patient Goals and CMS Choice            Expected Discharge Plan and Services                                              Prior Living Arrangements/Services                       Activities of Daily Living   ADL Screening (condition at time of admission) Independently performs ADLs?: Yes (appropriate for developmental age) Is the patient deaf or have difficulty hearing?: No Does the patient have difficulty seeing, even when wearing glasses/contacts?: No Does the patient have difficulty concentrating, remembering, or making decisions?: No  Permission Sought/Granted                  Emotional Assessment              Admission diagnosis:  NSTEMI (non-ST elevated myocardial infarction) (HCC) [I21.4] CHF exacerbation (HCC) [I50.9] Acute on chronic systolic congestive heart failure (HCC) [I50.23] Patient Active Problem List   Diagnosis Date Noted   NSTEMI (non-ST elevated myocardial infarction) (HCC) 08/31/2023   Transaminitis 08/31/2023   Shock liver 08/31/2023   CHF exacerbation (HCC) 08/30/2023   Vitamin D toxicity 07/24/2022   Anemia due to stage 4  chronic kidney disease (HCC) 03/20/2022   Hypercalcemia of malignancy 02/16/2021   Acute on chronic systolic heart failure (HCC)    Abnormal stress test 12/05/2020   Depressed left ventricular ejection fraction 12/05/2020   Nonrheumatic mitral valve regurgitation 12/05/2020   Pre-diabetes 12/05/2020   Pulmonary hypertension (HCC) 12/05/2020   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HCC) 12/05/2020   Tobacco use 12/05/2020   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   HFrEF (heart failure with reduced ejection fraction) (HCC) 11/23/2020   CAD (coronary artery disease) 11/23/2020   GERD (gastroesophageal reflux disease)    Hyperlipidemia    Hypertension    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   Malignant neoplasm of prostate (HCC) 06/02/2020   AKI (acute kidney injury) (HCC) 11/25/2019   Hyperphosphatemia 11/25/2019   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   Hyperkalemia 08/24/2019   Metabolic bone disease 08/24/2019   Vitamin D deficiency 08/24/2019   Benign hypertension with chronic kidney disease, stage III (HCC) 05/20/2019   Decreased cardiac ejection fraction  05/20/2019   Chronic kidney disease (CKD), stage IV (severe) (HCC) 05/20/2019   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 02/24/2019   Olecranon bursitis of left elbow 06/25/2018   B12 deficiency 03/22/2018   Gastroesophageal reflux disease without esophagitis 03/22/2018   Chronic renal insufficiency, stage III (moderate) (HCC) 11/27/2015   Aortic valve disorder 07/08/2014   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   Cataract 11/2013   Essential hypertension 07/06/2012   Coronary arteriosclerosis 07/06/2012   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   Left bundle branch block 07/06/2012   Mixed hyperlipidemia 07/06/2012   Cancer (HCC) 08/2008   S/P  radiation therapy > 12 wks ago 2010   PCP:  Clemmie Nest, MD Pharmacy:   [facility], [place] - [address] [place] [address] Phone: [phone] Fax: [phone]     Social Drivers of Health (SDOH) Social History: SDOH Screenings   Food Insecurity: No Food Insecurity (08/30/2023)  Housing: Low Risk  (08/30/2023)  Transportation Needs: No Transportation Needs (08/30/2023)  Utilities: Not At Risk (08/30/2023)  Financial Resource Strain: Low Risk  (05/22/2022)  Social Connections: Patient Declined (08/30/2023)  Tobacco Use: High Risk (08/30/2023)   SDOH Interventions:     Readmission Risk Interventions     No data to display

## 2023-09-01 NOTE — Progress Notes (Addendum)
 83 y.o. male with chronic systolic CHF admitted with cardiogenic shock, elevated troponin, AKI on CKD and shock liver.   Lactic acid up to 8.2 over the weekend and trended down to 2.4 with bicarb gtt. Diuretics held given poor response.  Echo with LVEF < 20%, RV mildly to moderately reduced, at least moderate MR, dilated IVC  Seen by [facility] Failure today. Lactic acid trending back up to 4.8. Scr up further to 4.7. He appeared confused but not in acute distress. Presentation concerning for cardiogenic shock. He was transferred to ICU for close monitoring and initiation of inotrope support with dobutamine  at 2.5 mcg/kg/min.    His wife was contacted. He is now DNR/DNI. No HD. If further deteriorates with inotrope support will need transition to comfort measures.   He was seen shortly after arrival to ICU.   Alert and oriented X 3. Not in any distress. Vitals are stable. NSR on telemetry. Volume overloaded with JVP to jaw.  Awaiting PICC placement. Continue to support cardiac output with inotrope and trend lactic acid to clearance. Increase dobutamine  to 4 mcg/kg/min and give 80 mg lasix  q 8hrs. May need to use lasix  gtt if does not have robust response.  Manuelita Dutch, PA-C 09/01/2023  Patient seen with PA, agree with the above note.   Patient has been very short of breath for about a week, but wife think onset has been more gradual.  He has been taking all his meds.  At admission, severely elevated LFTs, lactate 8.2, creatinine up to 4.13 from baseline of 2, HS-TnI 3487 but without trend.   Echo with EF < 20%, severe LV dilation, mild-moderate RV dysfunction, moderate MR, IVC dilated.   Abdominal US  without obvious cirrhosis, gallbladder surgically absent.   Scheduled Meds:  aspirin  EC  81 mg Oral Daily   Chlorhexidine  Gluconate Cloth  6 each Topical Daily   furosemide   80 mg Intravenous Q8H   mouth rinse  15 mL Mouth Rinse 4 times per day   pantoprazole   40 mg Oral Daily    tamsulosin   0.4 mg Oral Daily   Continuous Infusions:  DOBUTamine  4 mcg/kg/min (09/01/23 1605)   PRN Meds:.mouth rinse  General: NAD Neck: JVP 16 cm, no thyromegaly or thyroid nodule.  Lungs: Clear to auscultation bilaterally with normal respiratory effort. CV: Nondisplaced PMI.  Heart regular S1/S2, no S3/S4, no murmur.  Trace ankle edema.  No carotid bruit.  Normal pedal pulses.  Abdomen: Soft, nontender, no hepatosplenomegaly, no distention.  Skin: Jaundice.  Neurologic: Alert and oriented x 3.  Psych: Normal affect. Extremities: No clubbing or cyanosis.  HEENT: Normal.   1. Cardiogenic shock: Ischemic cardiomyopathy.  [employer] Jude CRT-D device.  Admitted with dyspnea, fatigue.   EF has ranged from <20% to 20-25% in the past.  Echo this admission with EF < 20%, severe LV dilation, mild-moderate RV dysfunction, moderate MR, IVC dilated.  Patient was admitted with AKI, markedly elevated LFTs, lactate 8.2 (4.1 today).  HS-TnI 3487 but no significant trend.  Cardiogenic shock explains his presentation.  Patient has not been effectively BiV pacing for at least a couple of months, this may play a role in his decompensation. On exam, he is volume overloaded.  SBP stable in 100s.  Creatinine 4.13.  - I will start him on dobutamine  4 mcg/kg/min.  - After dobutamine  started, will try to diurese given volume overload.  Start with Lasix  80 mg IV every 8 hrs, adjust based on response.  - Will place  PICC to follow CVP and co-ox, not long-term HD candidate with worry for end stage HF.  - St Jude rep saw patient and reprogrammed device, now effectively BiV pacing with narrowing of his QRS.   - We will use inotropes for a couple of days to look for improvement, but I worry that prognosis is poor at this point with multisystem organ failure (heart, liver, kidney).  At 83 years old and with renal failure, not a candidate for advanced therapies or mechanical support. I discussed this with his family. He will be DNR  and I will arrange for palliative care consultation.  2. CAD: Occluded mid LAD and 80% proximal stenosis in nondominant RCA in 4/22. No intervention. HS-TnI elevated at 3487 but no trend.  No chest pain.  May be demand ischemia from profound cardiogenic shock (see LFTs, AKI), but regardless would not cath with no CP and AKI.  - Continue ASA - Statin on hold with elevated LFTs.  - Stop heparin  gtt, start bivalirudin .  3. AKI on CKD stage 3: Baseline creatinine 2, now up to 4.1.  Suspect ATN due to cardiogenic shock.  - Support CO with dobutamine .  - Volume overloaded so will challenge with Lasix  when on dobutamine .  - Not a good long-term HD candidate with end stage HF.  4. Elevated LFTs: Suspect shock liver.  Abdominal US  without obvious cirrhosis, surgically absent gallbladder.  - Trend LFTs.  5. Thrombocytopenia: Plts have dropped, down to 65K.  May be due to shock but had been on heparin .  - Will use bivalirudin  for now with elevated troponin.   CRITICAL CARE Performed by: Ezra Shuck  Total critical care time: 60 minutes  Critical care time was exclusive of separately billable procedures and treating other patients.  Critical care was necessary to treat or prevent imminent or life-threatening deterioration.  Critical care was time spent personally by me on the following activities: development of treatment plan with patient and/or surrogate as well as nursing, discussions with consultants, evaluation of patient's response to treatment, examination of patient, obtaining history from patient or surrogate, ordering and performing treatments and interventions, ordering and review of laboratory studies, ordering and review of radiographic studies, pulse oximetry and re-evaluation of patient's condition.  Ezra Shuck 09/01/2023 4:16 PM

## 2023-09-01 NOTE — Progress Notes (Signed)
 Peripherally Inserted Central Catheter Placement  The IV Nurse has discussed with the patient and/or persons authorized to consent for the patient, the purpose of this procedure and the potential benefits and risks involved with this procedure.  The benefits include less needle sticks, lab draws from the catheter, and the patient may be discharged home with the catheter. Risks include, but not limited to, infection, bleeding, blood clot (thrombus formation), and puncture of an artery; nerve damage and irregular heartbeat and possibility to perform a PICC exchange if needed/ordered by physician.  Alternatives to this procedure were also discussed.  Bard Power PICC patient education guide, fact sheet on infection prevention and patient information card has been provided to patient /or left at bedside.    PICC Placement Documentation  PICC Triple Lumen 09/01/23 Right Basilic 38 cm 0 cm (Active)  Indication for Insertion or Continuance of Line Vasoactive infusions 09/01/23 1812  Exposed Catheter (cm) 0 cm 09/01/23 1812  Site Assessment Clean, Dry, Intact 09/01/23 1812  Lumen #1 Status Flushed;Saline locked;Blood return noted 09/01/23 1812  Lumen #2 Status Flushed;Saline locked;Blood return noted 09/01/23 1812  Lumen #3 Status Flushed;Saline locked;Blood return noted 09/01/23 1812  Dressing Type Transparent;Securing device 09/01/23 1812  Dressing Status Antimicrobial disc/dressing in place;Clean, Dry, Intact 09/01/23 1812  Line Care Connections checked and tightened 09/01/23 1812  Line Adjustment (NICU/IV Team Only) No 09/01/23 1812  Dressing Intervention New dressing;Adhesive placed at insertion site (IV team only);Other (Comment) 09/01/23 1812  Dressing Change Due 09/08/23 09/01/23 1812       Jolee Na 09/01/2023, 6:14 PM

## 2023-09-01 NOTE — Consult Note (Signed)
 Advanced Heart Failure Team Consult Note   Primary Physician: Clemmie Nest, MD Cardiologist:  Vinie JAYSON Maxcy, MD AHF MD: Dr. Rolan   Reason for Consultation: Acute on Chronic Systolic Heart Failure>>Cardiogenic Shock  HPI:    Tommy Ward is seen today for evaluation of acute on chronic systolic heart failure>>cardiogenic shock at the request of Dr. Sheena, Cardiology.   83 y.o. with history of prostate cancer, CKD stage 3, CAD, and ischemic cardiomyopathy Echo done in 4/22, showing EF < 20%, moderate-severe LV dilation, normal RV, severe MR.  LHC/RHC : low cardiac index at 1.81 and occluded mid LAD.  No intervention.  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    TEE 6/22, showing EF < 20% with septal-lateral dyssynchrony, mildly decreased RV systolic function, moderate TR, moderate central MR with ERO 0.2 cm^2.  Echo in 7/22 showed EF 20%, normal RV, moderate MR.  Patient had St Jude CRT-D device implanted.  Echo in 1/23 showed EF <20% with moderate LV dilation, mildly decreased RV systolic function, normal IVC.    Echo in 9/23 showed EF 20-25%, mild MR.   He presented to the ED on 1/11 w/ CC of worsening SOB but denied CP. Found to be in acute on chronic systolic heart failure, BNP significantly elevated 3686. Additional labs also c/w NSTEMI and concerning for cardiogenic shock w/ AKI, shock liver and profound lactic acidosis. Hs trop 3487, elevated LFTs (AST 3591, ALT 2873, alk phos 147), elevated Cr (4.10-baseline is 2.1).  Initial LA was 8.2.   He was placed on bicarb gtt and heparin  gtt over the week, w/ initial improvement in LA down to 2.4, however now back up to 4.8 this morning w/ worsening renal fx, SCr now up to 4.73, BUN 106. CO2 18. Oliguric, only 500 cc in UOP yesterday. Has s/o low output/uremia on exam w/ confusion/ AMS but no acute dyspnea/ respiratory distress, sating at 97% on  RA. Pts also low 88>>85>>65K. No gross bleeding.   Echo completed yesterday, LVEF < 20%, global HK, RV mild-mod reduced, functional MR at least mod severity, IVC dilated estimated RAP ~15 mmHg.     Home Medications Prior to Admission medications   Medication Sig Start Date End Date Taking? Authorizing Provider  aspirin  EC 81 MG tablet Take 1 tablet (81 mg total) by mouth daily. 09/20/19  Yes Hilty, Vinie JAYSON, MD  Cetirizine HCl 10 MG CAPS Take 1 capsule by mouth daily.   Yes [provider]  dapagliflozin  propanediol (FARXIGA ) 10 MG TABS tablet Take 1 tablet (10 mg total) by mouth daily before breakfast. 05/28/23  Yes Milford, Harlene HERO, FNP  digoxin  (LANOXIN ) 0.125 MG tablet Take 0.5 tablets (0.0625 mg total) by mouth daily. 07/02/23  Yes Rolan Ezra RAMAN, MD  enzalutamide  (XTANDI ) 40 MG tablet Take 160 mg by mouth daily.   Yes [provider]  Evolocumab  (REPATHA  SURECLICK) 140 MG/ML SOAJ INJECT 1 DOSE UNDER THE SKIN EVERY 14 DAYS 01/15/23  Yes Hilty, Vinie JAYSON, MD  FISH OIL-KRILL OIL PO Take 500 mg by mouth daily.   Yes [provider]  metolazone  (ZAROXOLYN ) 2.5 MG tablet Take 1 tablet (2.5 mg total) by mouth as directed. Only take as directed by the advanced heart failure clinic 06/05/23 09/03/23 Yes Milford, Harlene HERO, FNP  metoprolol  succinate (TOPROL  XL) 25 MG 24 hr tablet Take 1 tablet (25 mg total) by mouth at bedtime. 06/05/23  Yes  Banquete, Harlene HERO, FNP  Multiple Vitamins-Minerals (PRESERVISION/LUTEIN) CAPS Take 1 capsule by mouth at bedtime.   Yes [provider]  omeprazole  (PRILOSEC) 40 MG capsule Take 1 capsule (40 mg total) by mouth daily. 08/19/23  Yes   ondansetron  (ZOFRAN -ODT) 4 MG disintegrating tablet Take 4 mg by mouth every 8 (eight) hours as needed. 08/29/23  Yes [provider]  potassium chloride  SA (KLOR-CON  M) 20 MEQ tablet Take 1 tablet (20 mEq total) by mouth daily. 05/28/23  Yes Milford, Harlene HERO, FNP  pregabalin  (LYRICA )  100 MG capsule Take 100 mg by mouth 3 (three) times daily.   Yes [provider]  rosuvastatin  (CRESTOR ) 5 MG tablet Take 1 tablet (5 mg total) by mouth daily. 05/28/23  Yes Milford, Harlene HERO, FNP  spironolactone  (ALDACTONE ) 25 MG tablet Take 1 tablet (25 mg total) by mouth daily. 05/28/23  Yes Milford, Harlene HERO, FNP  tamsulosin  (FLOMAX ) 0.4 MG CAPS capsule Take 1 capsule (0.4 mg total) by mouth daily. 07/02/23  Yes Rolan Ezra RAMAN, MD  torsemide  (DEMADEX ) 20 MG tablet Take 4 tablets (80 mg total) by mouth in the morning AND 2 tablets (40 mg total) every evening. 05/28/23  Yes Milford, Harlene HERO, FNP  traZODone  (DESYREL ) 100 MG tablet Take 25 mg by mouth at bedtime as needed for sleep. 03/15/22  Yes [provider]  furosemide  (LASIX ) 20 MG tablet Take 20 mg by mouth daily. 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   Cataract 11/2013   bilateral   Chronic renal insufficiency, stage III (moderate) (HCC) 11/27/2015   Chronic systolic heart failure (HCC)    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 02/24/2019   Coronary arteriosclerosis 07/06/2012   Decreased cardiac ejection fraction 05/20/2019   Depressed left ventricular ejection fraction 12/05/2020   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   Essential hypertension 07/06/2012   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   Gastroesophageal reflux  disease without esophagitis 03/22/2018   GERD (gastroesophageal reflux disease)    HFrEF (heart failure with reduced ejection fraction) (HCC) 11/23/2020   Hypercalcemia of malignancy 02/16/2021   Hyperkalemia 08/24/2019   Hyperlipidemia    Hyperphosphatemia 11/25/2019   Hypertension    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   Left bundle branch block 07/06/2012   Malignant neoplasm of prostate (HCC)    Malignant neoplasm of prostate (HCC)    Metabolic bone disease 08/24/2019   Mixed hyperlipidemia 07/06/2012   Nonrheumatic mitral valve regurgitation 12/05/2020   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   Olecranon bursitis of left elbow 06/25/2018   Pre-diabetes 12/05/2020   Pulmonary hypertension (HCC) 12/05/2020   S/P radiation therapy > 12 wks ago 2010   prostate CA   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HCC)    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HCC) 12/05/2020   Tobacco use 12/05/2020   Vitamin D deficiency 08/24/2019   Vitamin D toxicity 07/24/2022  Past Surgical History: Past Surgical History:  Procedure Laterality Date   BIV ICD INSERTION CRT-D N/A 05/07/2021   Procedure: BIV ICD INSERTION CRT-D;  Surgeon: Cindie Ole DASEN, MD;  Location: [facility] INVASIVE CV LAB;  Service: Cardiovascular;  Laterality: N/A;   CHOLECYSTECTOMY  01/2003   lap chole   left hip repaired     RIGHT/LEFT HEART CATH AND CORONARY ANGIOGRAPHY N/A 12/08/2020   Procedure: RIGHT/LEFT HEART CATH AND CORONARY ANGIOGRAPHY;  Surgeon: Darron Deatrice LABOR, MD;  Location: [facility] INVASIVE CV LAB;  Service: Cardiovascular;  Laterality: N/A;   TEE WITHOUT CARDIOVERSION N/A 01/26/2021   Procedure: TRANSESOPHAGEAL ECHOCARDIOGRAM (TEE);  Surgeon: Rolan Ezra RAMAN, MD;  Location: [facility] ENDOSCOPY;  Service: Cardiovascular;  Laterality: N/A;    Family History: Family History  Problem Relation Age of Onset   Leukemia Mother    Prostate cancer Father    Heart attack  Brother    Colon cancer Neg Hx    Rectal cancer Neg Hx    Stomach cancer Neg Hx    Esophageal cancer Neg Hx     Social History: Social History   Socioeconomic History   Marital status: Married    Spouse name: Not on file   Number of children: Not on file   Years of education: Not on file   Highest education level: Not on file  Occupational History   Not on file  Tobacco Use   Smoking status: Every Day    Current packs/day: 0.50    Average packs/day: 0.5 packs/day for 50.0 years (25.0 ttl pk-yrs)    Types: Cigarettes   Smokeless tobacco: Never  Vaping Use   Vaping status: Never Used  Substance and Sexual Activity   Alcohol  use: Not Currently   Drug use: Never   Sexual activity: Not on file  Other Topics Concern   Not on file  Social History Narrative   Not on file   Social Drivers of Health   Financial Resource Strain: Low Risk  (05/22/2022)   Overall Financial Resource Strain (CARDIA)    Difficulty of Paying Living Expenses: Not very hard  Food Insecurity: No Food Insecurity (08/30/2023)   Hunger Vital Sign    Worried About Running Out of Food in the Last Year: Never true    Ran Out of Food in the Last Year: Never true  Transportation Needs: No Transportation Needs (08/30/2023)   PRAPARE - Administrator, Civil Service (Medical): No    Lack of Transportation (Non-Medical): No  Physical Activity: Not on file  Stress: Not on file  Social Connections: Patient Declined (08/30/2023)   Social Connection and Isolation Panel [NHANES]    Frequency of Communication with Friends and Family: Patient declined    Frequency of Social Gatherings with Friends and Family: Patient declined    Attends Religious Services: Patient declined    Database Administrator or Organizations: Patient declined    Attends Banker Meetings: Patient declined    Marital Status: Patient declined    Allergies:  Allergies  Allergen Reactions   Ezetimibe  Other (See Comments)     Myalgia   Gabapentin Itching and Other (See Comments)    Sore throat, breakouts   Statins Other (See Comments)    Myalgias (intolerance)    Objective:    Vital Signs:   Temp:  [97.4 F (36.3 C)-98.1 F (36.7 C)] 97.4 F (36.3 C) (01/13 0443) Pulse Rate:  [64-70] 70 (01/13 0443) Resp:  [18-20] 20 (01/13 0443) BP: (  102-110)/(58-62) 102/62 (01/13 0443) SpO2:  [91 %-100 %] 100 % (01/13 0443) Weight:  [85.5 kg] 85.5 kg (01/13 0443) Last BM Date : 08/29/23  Weight change: Filed Weights   08/30/23 2017 08/31/23 0509 09/01/23 0443  Weight: 82.7 kg 82.1 kg 85.5 kg    Intake/Output:   Intake/Output Summary (Last 24 hours) at 09/01/2023 0859 Last data filed at 09/01/2023 0150 Gross per 24 hour  Intake 600 ml  Output 500 ml  Net 100 ml      Physical Exam    General:  elderly male, no respiratory difficulty but confused  HEENT: normal Neck: supple. JVP to jaw . Carotids 2+ bilat; no bruits. No lymphadenopathy or thyromegaly appreciated. Cor: PMI nondisplaced. Regular rate & rhythm. 3/6 MR murmur, + RV heave  Lungs: decreased BS at the bases bilaterally  Abdomen: soft, nontender, nondistended. No hepatosplenomegaly. No bruits or masses. Good bowel sounds. Extremities: no cyanosis, clubbing, rash, edema Neuro: alert but altered. Oriented to place. No time. No responding to questions appropriately    Telemetry   Sinus tach low 100s   EKG    N/A   Labs   Basic Metabolic Panel: Recent Labs  Lab 08/30/23 1144 08/30/23 2259 08/31/23 0222 09/01/23 0250  NA 135 134* 134* 133*  K 5.3* 5.5* 5.3* 4.3  CL 96* 95* 95* 88*  CO2 16* 14* 11* 18*  GLUCOSE 94 67* 73 96  BUN 77* 89* 94* 106*  CREATININE 4.10* 4.50* 4.48* 4.73*  CALCIUM  8.6* 8.1* 7.8* 7.2*    Liver Function Tests: Recent Labs  Lab 08/30/23 1144 08/30/23 2259 08/31/23 1029  AST 3,591* 3,596* 4,105*  ALT 2,873* 3,201* 3,390*  ALKPHOS 147* 150* 160*  BILITOT 5.8* 7.0* 6.3*  PROT 6.0* 5.7* 5.2*   ALBUMIN 3.5 3.4* 3.1*   Recent Labs  Lab 08/30/23 1144  LIPASE 53*   Recent Labs  Lab 08/30/23 1918  AMMONIA 29    CBC: Recent Labs  Lab 08/30/23 1144 08/31/23 0222 08/31/23 0522 09/01/23 0250  WBC 11.5* 12.2* 10.5 10.0  NEUTROABS 9.1*  --   --   --   HGB 11.5* 10.7* 10.8* 10.8*  HCT 37.7* 35.5* 35.0* 34.0*  MCV 92.6 92.9 90.7 88.3  PLT 166 88* 85* 65*    Cardiac Enzymes: Recent Labs  Lab 08/31/23 0522  CKTOTAL 506*    BNP: BNP (last 3 results) Recent Labs    05/28/23 1444 08/04/23 1411 08/30/23 1144  BNP 1,737.8* 1,327.6* 3,686.6*    ProBNP (last 3 results) No results for input(s): PROBNP in the last 8760 hours.   CBG: No results for input(s): GLUCAP in the last 168 hours.  Coagulation Studies: Recent Labs    08/30/23 1605 08/31/23 1029  LABPROT 26.3* 34.3*  INR 2.4* 3.4*     Imaging   ECHOCARDIOGRAM COMPLETE Result Date: 08/31/2023    ECHOCARDIOGRAM REPORT   Patient Name:   Tommy Ward Date of Exam: 08/31/2023 Medical Rec #:  [id number]         Height:       69.0 in Accession #:    [id number]        Weight:       180.9 lb Date of Birth:  Mar 31, 1941          BSA:          1.980 m Patient Age:    82 years          BP:  104/53 mmHg Patient Gender: M                 HR:           70 bpm. Exam Location:  Inpatient Procedure: 2D Echo, 3D Echo, Cardiac Doppler, Color Doppler, Strain Analysis and            Intracardiac Opacification Agent Indications:    CHF  History:        Patient has prior history of Echocardiogram examinations, most                 recent 08/06/2023. Cardiomyopathy, CHF and HFrEF, NSTEMI and                 CAD, Pulmonary HTN, H/O Cancer and Carotid Disease; Risk                 Factors:Hypertension, Dyslipidemia and Current Smoker.  Sonographer:    Tillman Nora RVT RCS Referring Phys: KARDIE TOBB IMPRESSIONS  1. Left ventricular ejection fraction, by estimation, is <20%. The left ventricle has severely decreased  function. The left ventricle demonstrates global hypokinesis. The left ventricular internal cavity size was severely dilated.  2. Right ventricular systolic function mild to moderately reduced. The right ventricular size is mildly enlarged.  3. Left atrial size was moderately dilated.  4. Right atrial size was severely dilated.  5. Functional MR, at least moderate in severity . The mitral valve is normal in structure.  6. Tricuspid valve regurgitation is severe.  7. The aortic valve is tricuspid. Aortic valve regurgitation is not visualized. Aortic valve sclerosis/calcification is present, without any evidence of aortic stenosis.  8. The inferior vena cava is dilated in size with <50% respiratory variability, suggesting right atrial pressure of 15 mmHg. Comparison(s): EF 25-30%. FINDINGS  Left Ventricle: Left ventricular ejection fraction, by estimation, is <20%. The left ventricle has severely decreased function. The left ventricle demonstrates global hypokinesis. Definity  contrast agent was given IV to delineate the left ventricular endocardial borders. The left ventricular internal cavity size was severely dilated. There is no left ventricular hypertrophy. Right Ventricle: The right ventricular size is mildly enlarged. Right vetricular wall thickness was not assessed. Right ventricular systolic function mild to moderately reduced. Left Atrium: Left atrial size was moderately dilated. Right Atrium: Right atrial size was severely dilated. Pericardium: There is no evidence of pericardial effusion. Mitral Valve: Functional MR, at least moderate in severity. The mitral valve is normal in structure. Tricuspid Valve: The tricuspid valve is normal in structure. Tricuspid valve regurgitation is severe. Aortic Valve: The aortic valve is tricuspid. Aortic valve regurgitation is not visualized. Aortic valve sclerosis/calcification is present, without any evidence of aortic stenosis. Aortic valve mean gradient measures 3.0  mmHg. Aortic valve peak gradient measures 5.2 mmHg. Aortic valve area, by VTI measures 2.31 cm. Pulmonic Valve: The pulmonic valve was normal in structure. Pulmonic valve regurgitation is trivial. Aorta: The aortic root is normal in size and structure and the aortic root and ascending aorta are structurally normal, with no evidence of dilitation. Venous: The inferior vena cava is dilated in size with less than 50% respiratory variability, suggesting right atrial pressure of 15 mmHg. IAS/Shunts: No atrial level shunt detected by color flow Doppler. Additional Comments: A device lead is visualized.  LEFT VENTRICLE PLAX 2D LVIDd:         6.47 cm LVIDs:         6.10 cm LV PW:  1.07 cm LV IVS:        1.03 cm LVOT diam:     2.20 cm   3D Volume EF: LV SV:         50        3D EF:        25 % LV SV Index:   25        LV EDV:       356 ml LVOT Area:     3.80 cm  LV ESV:       268 ml                          LV SV:        88 ml RIGHT VENTRICLE             IVC RV Basal diam:  4.40 cm     IVC diam: 2.70 cm RV S prime:     12.20 cm/s TAPSE (M-mode): 1.6 cm LEFT ATRIUM             Index        RIGHT ATRIUM           Index LA diam:        4.60 cm 2.32 cm/m   RA Area:     28.80 cm LA Vol (A2C):   93.1 ml 47.03 ml/m  RA Volume:   98.70 ml  49.86 ml/m LA Vol (A4C):   75.3 ml 38.04 ml/m LA Biplane Vol: 96.1 ml 48.54 ml/m  AORTIC VALVE                    PULMONIC VALVE AV Area (Vmax):    2.38 cm     PR End Diast Vel: 0.92 msec AV Area (Vmean):   2.22 cm AV Area (VTI):     2.31 cm AV Vmax:           114.00 cm/s AV Vmean:          74.600 cm/s AV VTI:            0.216 m AV Peak Grad:      5.2 mmHg AV Mean Grad:      3.0 mmHg LVOT Vmax:         71.30 cm/s LVOT Vmean:        43.500 cm/s LVOT VTI:          0.131 m LVOT/AV VTI ratio: 0.61  AORTA Ao Root diam: 2.60 cm Ao Asc diam:  3.20 cm MITRAL VALVE               TRICUSPID VALVE MV Area (PHT): 4.39 cm    TR Peak grad:   23.2 mmHg MV Decel Time: 173 msec    TR Vmax:         241.00 cm/s MV E velocity: 41.40 cm/s MV A velocity: 66.60 cm/s  SHUNTS MV E/A ratio:  0.62        Systemic VTI:  0.13 m                            Systemic Diam: 2.20 cm Vina Gull MD Electronically signed by Vina Gull MD Signature Date/Time: 08/31/2023/3:07:15 PM    Final      Medications:     Current Medications:  aspirin  EC  81 mg Oral Daily   metoprolol  succinate  25 mg Oral QHS  pantoprazole   40 mg Oral Daily   tamsulosin   0.4 mg Oral Daily    Infusions:  heparin  1,450 Units/hr (09/01/23 0441)   sodium bicarbonate  150 mEq in sterile water  1,150 mL infusion 125 mL/hr at 09/01/23 0451      Patient Profile   83 y.o. with history of metastatic prostate cancer, CKD stage 3, CAD, and chronic systolic heart failure due to ischemic cardiomyopathy, admitted w/ a/c CHF w/ cardiogenic shock w/ AKI, shock liver and profound lactic acidosis.  Assessment/Plan   1. Acute on Chronic Systolic Heart Failure>>CS - ICM. Echo in 4/22 with EF < 20%, moderate-severe LV dilation, normal RV, severe LAE, severe MR. RHC in 4/22 with CI 1.8. TEE in 6/22 with EF <20%, dyssynchrony, mildly decreased RV systolic function. Echo in 7/22 with EF < 20%, moderate MR. St Jude CRT-D device placed. Echo in 1/23 showed EF < 20%, mild RV dysfunction. Echo in 9/23 showed EF 20-25%, mild MR.  - Admitted w/ NYHA Class IIIb symptoms and CS w/ AKI, shock liver and initial LA of 8.2. Echo EF < 20%, RV mod reduced - remains acidotic today, though improved w/ bicarb gtt (d/ced this morning). LA improved to 2.4 yesterday, back up to 4.8 today. Scr up to 4.7, BUN 106. Confused/altered but no respiratory distress  - will transfer to ICU and place PICC line (not candidate for long term HD). Will start empiric inotropic support w/ DBA 2.5 mcg/kg/min. Follow co-ox and CVPs - nephrology following, can trial high dose diuretics once loaded w/ inotropic support but pt and family do not want dialysis (await CVP reading)  - not  candidate for advanced therapies given age and comorbidities   - if fails to improve w/ inotrope's, will need transition to comfort care   2. AKI on CKD IIIb - b/l SCr ~2.0. 4.0 on admit>>4.7 today. BUN 106 and uremic w/ confusion though may also just be 2/2 low output - oliguric but no respiratory distress  - cardiorenal  - d/w nephrology at bedside, likely would be a poor candidate for HD. D/w pt's wife. She reports he would also not want HD - will place picc and trial inotropic support w/ DBA. If failure to improve, will need to transition to comfort care  3. Shock Liver  - AST peaked 4,105 - ALT 3,390 - start DBA per above and follow trends   4. Metastatic Prostate Cancer - bone mets  - treated with radiation and currently controlled with denosumab    5. CODE Status - updated pt's wife by phone, family has decided DNR/DNI   6. CAD/ NSTEMI  - Occluded mid LAD and 80% proximal stenosis in nondominant RCA in 4/22. No intervention.  - denied CP on admit but ruled in for NSTEMI, Hs trop peaked 3,553 - treated w/ heparin  gtt x 48 hrs. Hs trops downtrending - stop heparin  gtt now w/ thrombocytopenia  - ASA 81 - no ? blocker or statin w/ shock/shock liver  7. Thrombocytopenia - acute, initial plts 166 on admit - 88>>85>>68K, no gross bleeding - suspect 2/2 critical illness but ? HIT. Stop heparin  gtt - follow level/ H/H    Length of Stay: 2  Caffie Shed, PA-C  09/01/2023, 8:59 AM  Advanced Heart Failure Team Pager [phone] (M-F; 7a - 5p)  Please contact [facility] for night-coverage after hours (4p -7a ) and weekends on [email or web address]

## 2023-09-01 NOTE — Progress Notes (Signed)
 Mobility Specialist Progress Note:    09/01/23 1210  Mobility  Activity Transferred from bed to chair  Level of Assistance Moderate assist, patient does 50-74%  Assistive Device Front wheel walker  Distance Ambulated (ft) 5 ft  Activity Response Tolerated well  Mobility Referral Yes  Mobility visit 1 Mobility  Mobility Specialist Start Time (ACUTE ONLY) 1035  Mobility Specialist Stop Time (ACUTE ONLY) 1051  Mobility Specialist Time Calculation (min) (ACUTE ONLY) 16 min   Pt received in bed agreeable to mobility. Pt needed MinA w/ bed mobility and ModA w/ STS. Required mod verbal cues throughout session to keep pt focused and oriented. Pt was able to take small steps towards the chair w/o fault. Call bell and personal belongings in reach. Chair alarm on. All needs met.  Thersia Minder Mobility Specialist  Please contact vis Secure Chat or  Rehab Office [phone]

## 2023-09-01 NOTE — Progress Notes (Signed)
 Progress Note   Patient: Tommy Ward FMW:[id number] DOB: [birth year] DOA: 08/30/2023     2 DOS: the patient was seen and examined on 09/01/2023   Brief hospital course: Tommy Ward was admitted to the hospital with the working diagnosis of heart failure decompensation complicated with NSTEMI and multiorgan failure.    83 y.o. 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 In ED, pt was found to have BNP of 3686 with trop of 3487. Cr was elevated at 4.10 with K of 5.3. AST was also elevated at 3591 with ALT 2873 with TB of 5.8 and alk phos of 147. CXR was notable for cardiomegaly. Abd CT with mild ascites, otherwise liver appeared unremarkable. No hydronephrosis and bladder appeared unremarkable. Pt was started on heparin  gtt. Cardiology consulted and lasix  initiated.    01/13 echocardiogram with reduced LV systolic function to less than 20% with signs of hypoperfusion. Transferred to ICU for inotropic support.  Not candidate for advance therapies or hemodialysis. Poor prognosis, CODE status changed to DNR.   Assessment and Plan: * Acute on chronic systolic heart failure (HCC) Echocardiogram with significant reduction in LV systolic function with EF <20%, global hypokinesis, internal cavity with severe dilatation, RV with mild to moderate reduction in systolic function, RV with mild enlargement, RA  with severe dilatation, moderate function mitral regurgitation, severe TR.   Lactic acid trending up from 2,4 to 4,8, 4.1   Patient transferred to ICU for inotropic support with dobutamine .  Furosemide  80 mg IV q8 hrs  Patient with poor prognosis, multiorgan failure. Not candidate for advanced therapies, if no response to IV inotropic support may need to transition to comfort care.   Chronic kidney disease (CKD), stage IV (severe)  (HCC) Continue worsening renal function with serum cr at 4,7 with K at 4,3 and serum bicarbonate at 18  Na 133   Continue hemodynamic support.  Poor prognosis Follow up on renal function and electrolytes.   NSTEMI (non-ST elevated myocardial infarction) (HCC) Known to have coronary artery disease.  Currently with no chest pain.  Continue medical management with IV heparin  and metoprolol .  Follow up on repeat echocardiogram, for wall motion abnormalities.   Shock liver Mild trend down in liver enzymes but continue to be significantly elevated.   Essential hypertension Continue blood pressure monitoring.    GERD (gastroesophageal reflux disease) Continue with pantoprazole .   Malignant neoplasm of prostate (HCC) Follow up as outpatient.         Subjective: Patient somnolent but easy to arouse, no chest pain or dyspnea, denies abdominal pain.   Physical Exam: Vitals:   09/01/23 0928 09/01/23 1306 09/01/23 1409 09/01/23 1551  BP: (!) 113/59 (!) 117/56 (!) 122/50   Pulse: 69     Resp: 18     Temp: (!) 97.5 F (36.4 C)   97.9 F (36.6 C)  TempSrc: Oral   Oral  SpO2: 98%     Weight:      Height:       Neurology somnolent but easy to arouse, very weak, deconditioned and ill looking appearing  ENT with positive pallor and icterus Cardiovascular with S1 and S2 present and regular with no gallops or rubs, positive systolic murmur at the apex and right lower sternal border  Respiratory with rales at bases with no wheezing or rhonchi Abdomen with  no distention  Positive lower extremity edema +  Data Reviewed:    Family Communication: no family at the bedside   Disposition: Status is: Inpatient Remains inpatient appropriate because: IV inotropics   Planned Discharge Destination: Skilled nursing facility      Author: Elidia Toribio Furnace, MD 09/01/2023 3:57 PM  For on call review [email or web address].

## 2023-09-01 NOTE — Progress Notes (Signed)
 [facility] NEPHROLOGY PROGRESS NOTE  Assessment/ Plan: Pt is a 83 y.o. yo male with medical history significant for CAD, systolic CHF status post biventricular ICD, HLD, HTN, moderate MR admitted with shortness of breath and CHF exacerbation.  # AKI on CKD 3b - b/l creatinine 1.8- 2.2.  AKI in the setting of SOB and h/o HFrEF w/ low EF 25-30%. CT showing normal kidneys w/o obstruction. UA negative. CXR clear. Urine lytes c/w prerenal. On exam euvolemic possibly dry initially therefore treated with IV fluid without much response.   Short of breath persistent and no improvement in renal function with IV fluid.  I recommend heart cath to assess volume status and possible need for inotropes.  Discussed with the heart failure team.  No urgent need for dialysis today however, I'm worried that he may not tolerate dialysis long-term, in that case I will recommend palliative hospice care.  # Hyperkalemia : Improved with medical management.  Follow lab.    # Transaminitis likely due to shock liver.  Enzymes are trending down.  Seen by GI.  #HFrEF - last echo dec 2024 w/ EF of 20-25%.  #H/o severe pHTN #Trop - started on IV heparin , Cards following  Subjective: Seen and examined at bedside.  Patient reported shortness of breath and no improvement from yesterday.  Urine output is recorded around 500 cc.  Heart failure team was presented with us .  He is open to the idea of dialysis if needed. Objective Vital signs in last 24 hours: Vitals:   08/31/23 2005 09/01/23 0147 09/01/23 0443 09/01/23 0928  BP: (!) 107/59 (!) 107/58 102/62 (!) 113/59  Pulse: 64 69 70 69  Resp: 18 19 20 18   Temp: 97.9 F (36.6 C) 98 F (36.7 C) (!) 97.4 F (36.3 C) (!) 97.5 F (36.4 C)  TempSrc: Oral Oral Oral Oral  SpO2: 97% 95% 100% 98%  Weight:   85.5 kg   Height:       Weight change: 5.503 kg  Intake/Output Summary (Last 24 hours) at 09/01/2023 1328 Last data filed at 09/01/2023 0150 Gross per 24  hour  Intake 240 ml  Output 500 ml  Net -260 ml       Labs: RENAL PANEL Recent Labs  Lab 08/30/23 1144 08/30/23 2259 08/31/23 0222 08/31/23 1029 09/01/23 0250 09/01/23 0939  NA 135 134* 134*  --  133*  --   K 5.3* 5.5* 5.3*  --  4.3  --   CL 96* 95* 95*  --  88*  --   CO2 16* 14* 11*  --  18*  --   GLUCOSE 94 67* 73  --  96  --   BUN 77* 89* 94*  --  106*  --   CREATININE 4.10* 4.50* 4.48*  --  4.73*  --   CALCIUM  8.6* 8.1* 7.8*  --  7.2*  --   ALBUMIN 3.5 3.4*  --  3.1*  --  3.2*    Liver Function Tests: Recent Labs  Lab 08/30/23 2259 08/31/23 1029 09/01/23 0939  AST 3,596* 4,105* 3,190*  ALT 3,201* 3,390* 3,386*  ALKPHOS 150* 160* 183*  BILITOT 7.0* 6.3* 7.2*  PROT 5.7* 5.2* 5.4*  ALBUMIN 3.4* 3.1* 3.2*   Recent Labs  Lab 08/30/23 1144  LIPASE 53*   Recent Labs  Lab 08/30/23 1918  AMMONIA 29   CBC: Recent Labs    01/27/23 1030 08/30/23 1144 08/31/23 0222 08/31/23 0522 09/01/23 0250  HGB 13.8 11.5* 10.7* 10.8*  10.8*  MCV 90.7 92.6 92.9 90.7 88.3  FERRITIN  --   --   --  119  --   TIBC  --   --   --  448  --   IRON  --   --   --  56  --     Cardiac Enzymes: Recent Labs  Lab 08/31/23 0522  CKTOTAL 506*   CBG: No results for input(s): GLUCAP in the last 168 hours.  Iron Studies:  Recent Labs    08/31/23 0522  IRON 56  TIBC 448  FERRITIN 119   Studies/Results: US  EKG SITE RITE Result Date: 09/01/2023 If Site Rite image not attached, placement could not be confirmed due to current cardiac rhythm.  ECHOCARDIOGRAM COMPLETE Result Date: 08/31/2023    ECHOCARDIOGRAM REPORT   Patient Name:   Trygve Thal [facility] Date of Exam: 08/31/2023 Medical Rec #:  [id number]         Height:       69.0 in Accession #:    [id number]        Weight:       180.9 lb Date of Birth:  [birth year]          BSA:          1.980 m Patient Age:    82 years          BP:           104/53 mmHg Patient Gender: M                 HR:           70 bpm. Exam Location:   Inpatient Procedure: 2D Echo, 3D Echo, Cardiac Doppler, Color Doppler, Strain Analysis and            Intracardiac Opacification Agent Indications:    CHF  History:        Patient has prior history of Echocardiogram examinations, most                 recent 08/06/2023. Cardiomyopathy, CHF and HFrEF, NSTEMI and                 CAD, Pulmonary HTN, H/O Cancer and Carotid Disease; Risk                 Factors:Hypertension, Dyslipidemia and Current Smoker.  Sonographer:    Tillman Nora RVT RCS Referring Phys: KARDIE TOBB IMPRESSIONS  1. Left ventricular ejection fraction, by estimation, is <20%. The left ventricle has severely decreased function. The left ventricle demonstrates global hypokinesis. The left ventricular internal cavity size was severely dilated.  2. Right ventricular systolic function mild to moderately reduced. The right ventricular size is mildly enlarged.  3. Left atrial size was moderately dilated.  4. Right atrial size was severely dilated.  5. Functional MR, at least moderate in severity . The mitral valve is normal in structure.  6. Tricuspid valve regurgitation is severe.  7. The aortic valve is tricuspid. Aortic valve regurgitation is not visualized. Aortic valve sclerosis/calcification is present, without any evidence of aortic stenosis.  8. The inferior vena cava is dilated in size with <50% respiratory variability, suggesting right atrial pressure of 15 mmHg. Comparison(s): EF 25-30%. FINDINGS  Left Ventricle: Left ventricular ejection fraction, by estimation, is <20%. The left ventricle has severely decreased function. The left ventricle demonstrates global hypokinesis. Definity  contrast agent was given IV to delineate the left ventricular endocardial borders. The left ventricular internal cavity size was  severely dilated. There is no left ventricular hypertrophy. Right Ventricle: The right ventricular size is mildly enlarged. Right vetricular wall thickness was not assessed. Right ventricular  systolic function mild to moderately reduced. Left Atrium: Left atrial size was moderately dilated. Right Atrium: Right atrial size was severely dilated. Pericardium: There is no evidence of pericardial effusion. Mitral Valve: Functional MR, at least moderate in severity. The mitral valve is normal in structure. Tricuspid Valve: The tricuspid valve is normal in structure. Tricuspid valve regurgitation is severe. Aortic Valve: The aortic valve is tricuspid. Aortic valve regurgitation is not visualized. Aortic valve sclerosis/calcification is present, without any evidence of aortic stenosis. Aortic valve mean gradient measures 3.0 mmHg. Aortic valve peak gradient measures 5.2 mmHg. Aortic valve area, by VTI measures 2.31 cm. Pulmonic Valve: The pulmonic valve was normal in structure. Pulmonic valve regurgitation is trivial. Aorta: The aortic root is normal in size and structure and the aortic root and ascending aorta are structurally normal, with no evidence of dilitation. Venous: The inferior vena cava is dilated in size with less than 50% respiratory variability, suggesting right atrial pressure of 15 mmHg. IAS/Shunts: No atrial level shunt detected by color flow Doppler. Additional Comments: A device lead is visualized.  LEFT VENTRICLE PLAX 2D LVIDd:         6.47 cm LVIDs:         6.10 cm LV PW:         1.07 cm LV IVS:        1.03 cm LVOT diam:     2.20 cm   3D Volume EF: LV SV:         50        3D EF:        25 % LV SV Index:   25        LV EDV:       356 ml LVOT Area:     3.80 cm  LV ESV:       268 ml                          LV SV:        88 ml RIGHT VENTRICLE             IVC RV Basal diam:  4.40 cm     IVC diam: 2.70 cm RV S prime:     12.20 cm/s TAPSE (M-mode): 1.6 cm LEFT ATRIUM             Index        RIGHT ATRIUM           Index LA diam:        4.60 cm 2.32 cm/m   RA Area:     28.80 cm LA Vol (A2C):   93.1 ml 47.03 ml/m  RA Volume:   98.70 ml  49.86 ml/m LA Vol (A4C):   75.3 ml 38.04 ml/m LA  Biplane Vol: 96.1 ml 48.54 ml/m  AORTIC VALVE                    PULMONIC VALVE AV Area (Vmax):    2.38 cm     PR End Diast Vel: 0.92 msec AV Area (Vmean):   2.22 cm AV Area (VTI):     2.31 cm AV Vmax:           114.00 cm/s AV Vmean:  74.600 cm/s AV VTI:            0.216 m AV Peak Grad:      5.2 mmHg AV Mean Grad:      3.0 mmHg LVOT Vmax:         71.30 cm/s LVOT Vmean:        43.500 cm/s LVOT VTI:          0.131 m LVOT/AV VTI ratio: 0.61  AORTA Ao Root diam: 2.60 cm Ao Asc diam:  3.20 cm MITRAL VALVE               TRICUSPID VALVE MV Area (PHT): 4.39 cm    TR Peak grad:   23.2 mmHg MV Decel Time: 173 msec    TR Vmax:        241.00 cm/s MV E velocity: 41.40 cm/s MV A velocity: 66.60 cm/s  SHUNTS MV E/A ratio:  0.62        Systemic VTI:  0.13 m                            Systemic Diam: 2.20 cm Vina Gull MD Electronically signed by Vina Gull MD Signature Date/Time: 08/31/2023/3:07:15 PM    Final    US  Abdomen Limited RUQ (LIVER/GB) Result Date: 08/31/2023 CLINICAL DATA:  History of cholecystectomy presenting with transaminitis. EXAM: ULTRASOUND ABDOMEN LIMITED RIGHT UPPER QUADRANT COMPARISON:  None Available. FINDINGS: Gallbladder: The gallbladder is surgically absent. Common bile duct: Diameter: 5.5 mm Liver: No focal lesion identified. Mild, diffusely increased echogenicity of the liver parenchyma is noted. Portal vein is patent on color Doppler imaging with normal direction of blood flow towards the liver. Other: Of incidental note is the presence of a right pleural effusion. A trace amount of ascites is also seen. IMPRESSION: 1. Findings consistent with history of prior cholecystectomy. 2. Hepatic steatosis without focal liver lesions. 3. Right pleural effusion. 4. Trace ascites. Electronically Signed   By: Suzen Dials M.D.   On: 08/31/2023 03:26   CT ABDOMEN PELVIS WO CONTRAST Result Date: 08/30/2023 CLINICAL DATA:  Nausea and vomiting for the past 2 days. Shortness of breath. EXAM: CT  ABDOMEN AND PELVIS WITHOUT CONTRAST TECHNIQUE: Multidetector CT imaging of the abdomen and pelvis was performed following the standard protocol without IV contrast. RADIATION DOSE REDUCTION: This exam was performed according to the departmental dose-optimization program which includes automated exposure control, adjustment of the mA and/or kV according to patient size and/or use of iterative reconstruction technique. COMPARISON:  Right upper quadrant ultrasound dated January 25, 2021. FINDINGS: Lower chest: Cardiomegaly. Small right pleural effusion with adjacent right lower lobe atelectasis. Hepatobiliary: No focal liver abnormality. Status post cholecystectomy. No biliary dilatation. Pancreas: Unremarkable. No pancreatic ductal dilatation or surrounding inflammatory changes. Spleen: Normal in size without focal abnormality. Adrenals/Urinary Tract: Adrenal glands are unremarkable. Small bilateral renal simple cysts. No follow-up imaging is recommended. Punctate calculus in the upper pole of the right kidney. No hydronephrosis. Bladder is unremarkable. Stomach/Bowel: Stomach is within normal limits. Appendix appears normal. No evidence of bowel wall thickening, distention, or inflammatory changes. Vascular/Lymphatic: Aortic atherosclerosis. No enlarged abdominal or pelvic lymph nodes. Reproductive: Brachytherapy seeds within the prostate gland. Other: Small perihepatic ascites.  No pneumoperitoneum. Musculoskeletal: No acute or significant osseous findings. Prior left total hip arthroplasty. IMPRESSION: 1. No acute intra-abdominal process. 2. Small perihepatic ascites. 3. Cardiomegaly with small right pleural effusion. 4. Punctate nonobstructive right nephrolithiasis. 5.  Aortic Atherosclerosis (  ICD10-I70.0). Electronically Signed   By: Elsie ONEIDA Shoulder M.D.   On: 08/30/2023 14:45    Medications: Infusions:  DOBUTamine  2.5 mcg/kg/min (09/01/23 1317)    Scheduled Medications:  aspirin  EC  81 mg Oral Daily    mouth rinse  15 mL Mouth Rinse 4 times per day   pantoprazole   40 mg Oral Daily   tamsulosin   0.4 mg Oral Daily    have reviewed scheduled and prn medications.  Physical Exam: General:NAD, comfortable Heart:RRR, s1s2 nl Lungs: Basal decreased breath sound, no increased work of breathing Abdomen:soft, Non-tender, non-distended Extremities:No edema Neurology: Alert awake and following commands  Trevin Gartrell Amelie Romney 09/01/2023,1:28 PM  LOS: 2 days

## 2023-09-01 NOTE — Progress Notes (Signed)
 PHARMACY - ANTICOAGULATION CONSULT NOTE  Pharmacy Consult for bivalirudin  Indication: chest pain/ACS  Allergies  Allergen Reactions   Ezetimibe  Other (See Comments)    Myalgia   Gabapentin Itching and Other (See Comments)    Sore throat, breakouts   Statins Other (See Comments)    Myalgias (intolerance)    Patient Measurements: Height: 5' 9 (175.3 cm) Weight: 85.5 kg (188 lb 8 oz) IBW/kg (Calculated) : 70.7  Vital Signs: Temp: 97.9 F (36.6 C) (01/13 1551) Temp Source: Oral (01/13 1551) BP: 124/62 (01/13 1700) Pulse Rate: 81 (01/13 1700)  Labs: Recent Labs    08/30/23 1144 08/30/23 1605 08/30/23 2101 08/30/23 2259 08/31/23 0222 08/31/23 0522 08/31/23 0718 08/31/23 1029 08/31/23 1745 09/01/23 0250  HGB 11.5*  --   --   --  10.7* 10.8*  --   --   --  10.8*  HCT 37.7*  --   --   --  35.5* 35.0*  --   --   --  34.0*  PLT 166  --   --   --  88* 85*  --   --   --  65*  APTT  --   --   --   --   --   --   --   --  65* 77*  LABPROT  --  26.3*  --   --   --   --   --  34.3*  --   --   INR  --  2.4*  --   --   --   --   --  3.4*  --   --   HEPARINUNFRC  --   --    < >  --  0.11*  --   --   --  0.16* 0.22*  CREATININE 4.10*  --   --  4.50* 4.48*  --   --   --   --  4.73*  CKTOTAL  --   --   --   --   --  506*  --   --   --   --   TROPONINIHS 3,487*  --   --   --   --  6,446* 3,733*  --   --   --    < > = values in this interval not displayed.    Estimated Creatinine Clearance: 13.3 mL/min (A) (by C-G formula based on SCr of 4.73 mg/dL (H)).   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   Cataract 11/2013   bilateral   Chronic renal insufficiency, stage III (moderate) (HCC) 11/27/2015   Chronic systolic heart failure (HCC)    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 02/24/2019   Coronary arteriosclerosis 07/06/2012   Decreased cardiac ejection fraction 05/20/2019   Depressed left ventricular ejection fraction 12/05/2020   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   Essential hypertension 07/06/2012   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   Gastroesophageal reflux disease without esophagitis 03/22/2018   GERD (gastroesophageal reflux disease)    HFrEF (  heart failure with reduced ejection fraction) (HCC) 11/23/2020   Hypercalcemia of malignancy 02/16/2021   Hyperkalemia 08/24/2019   Hyperlipidemia    Hyperphosphatemia 11/25/2019   Hypertension    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   Left bundle branch block 07/06/2012   Malignant neoplasm of prostate (HCC)    Malignant neoplasm of prostate (HCC)    Metabolic bone disease 08/24/2019   Mixed hyperlipidemia 07/06/2012   Nonrheumatic mitral valve regurgitation 12/05/2020   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   Olecranon bursitis of left elbow 06/25/2018   Pre-diabetes 12/05/2020   Pulmonary hypertension (HCC) 12/05/2020   S/P radiation therapy > 12 wks ago 2010   prostate CA   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HCC)    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HCC) 12/05/2020   Tobacco use 12/05/2020   Vitamin D deficiency 08/24/2019   Vitamin D toxicity 07/24/2022    Medications:  Scheduled:   aspirin  EC  81 mg Oral Daily   Chlorhexidine  Gluconate Cloth  6 each Topical Daily   furosemide   80 mg Intravenous Q8H   heparin  injection (subcutaneous)  5,000 Units Subcutaneous Q8H   pantoprazole   40 mg Oral Daily   tamsulosin   0.4 mg Oral  Daily   Infusions:   DOBUTamine  4 mcg/kg/min (09/01/23 1605)    Assessment:  83 years of age male that was admitted with shortness of breath and elevated troponin. He was not on anticoagulation prior to admission. Pharmacy consulted to change IV Heparin  to bivalirudin  per Dr. Rolan.  Patient currently in acute liver failure and coagulopathy with prolonged PT/elevated INR and low Platelets. Also in acute renal failure which will reduce clearance of anticoagulation and potential for accumulation. 4T score is 0 - low suspicion for HIT. Discussed all these factors with Dr. Rolan - he still wants to proceed with bivalirudin  tonight and reassess in AM.  Heparin  gtt stopped at 1437 PM.   Goal of Therapy:  aPTT 50-85 seconds Monitor platelets by anticoagulation protocol: Yes   Plan:  Due to CrCl <30 without HD/CRRT and with concern of coagulopathy, starting bivalirudin  at low rate at 0.05 mg/kg/hr.  aPTT in 4 hours Will check every 4 hours until therapeutic, then every 12 hours.   Harlene Boga, PharmD, BCPS, BCCCP Please refer to [facility] for [facility] Pharmacy numbers 09/01/2023,5:23 PM

## 2023-09-01 NOTE — Progress Notes (Signed)
 Heart Failure Navigator Progress Note  Assessed for Heart & Vascular TOC clinic readiness.  Patient does not meet criteria due to Advanced Heart Failure Team patient of Dr. Shirlee Latch.   Navigator will sign off at this time.    Tommy Ward, BSN, Scientist, clinical (histocompatibility and immunogenetics) Only

## 2023-09-01 NOTE — Consult Note (Signed)
 [facility] Liaison Consult Note   09/01/2023  Tommy Ward [facility] 07-Jan-1941 [id number]  Insurance: Medicare ACO REACH   Primary Care Provider: Clemmie Nest, MD with [facility], this provider is listed for the transition of care follow up appointments.   [facility] Liaison met patient at bedside at [facility].  Family at bedside, staff in to get lab.  Endorses PCP and insurance coverage, SDOH intact.  Patient is active with [facility] as well.    The patient was screened for hospitalization with noted high risk score for unplanned readmission risk 1 hospital admissions in 6 months.  The patient was assessed for potential Community Care Coordination service needs for post hospital transition for care coordination. Review of patient's electronic medical record reveals patient is for ongoing medical assessment.  Admitted with Acute on chronic systolic HF per MD progress notes as reviewed.  Plan: [facility] Liaison will continue to follow progress and disposition to asess for post hospital community care coordination/management needs.  Referral request for community care coordination: No current community follow up needs assessed as patient is followed closely by Adv HF team.   [facility], Population Health does not replace or interfere with any arrangements made by the Inpatient Transition of Care team.   For questions contact:   Richerd Fish, RN, BSN, CCM [place]  [facility], [facility] Liaison Direct Dial: [phone] or secure chat Email: [email or web address]

## 2023-09-02 DIAGNOSIS — R570 Cardiogenic shock: Secondary | ICD-10-CM | POA: Diagnosis not present

## 2023-09-02 DIAGNOSIS — Z515 Encounter for palliative care: Secondary | ICD-10-CM | POA: Diagnosis not present

## 2023-09-02 DIAGNOSIS — Z7189 Other specified counseling: Secondary | ICD-10-CM | POA: Diagnosis not present

## 2023-09-02 DIAGNOSIS — N184 Chronic kidney disease, stage 4 (severe): Secondary | ICD-10-CM | POA: Diagnosis not present

## 2023-09-02 DIAGNOSIS — D509 Iron deficiency anemia, unspecified: Secondary | ICD-10-CM | POA: Diagnosis not present

## 2023-09-02 DIAGNOSIS — I214 Non-ST elevation (NSTEMI) myocardial infarction: Secondary | ICD-10-CM | POA: Diagnosis not present

## 2023-09-02 DIAGNOSIS — R7989 Other specified abnormal findings of blood chemistry: Secondary | ICD-10-CM | POA: Diagnosis not present

## 2023-09-02 DIAGNOSIS — Z66 Do not resuscitate: Secondary | ICD-10-CM

## 2023-09-02 DIAGNOSIS — I5023 Acute on chronic systolic (congestive) heart failure: Secondary | ICD-10-CM | POA: Diagnosis not present

## 2023-09-02 DIAGNOSIS — K7200 Acute and subacute hepatic failure without coma: Secondary | ICD-10-CM | POA: Diagnosis not present

## 2023-09-02 LAB — ANA: Anti Nuclear Antibody (ANA): NEGATIVE

## 2023-09-02 LAB — HEPATIC FUNCTION PANEL
ALT: 2815 U/L — ABNORMAL HIGH (ref 0–44)
AST: 1868 U/L — ABNORMAL HIGH (ref 15–41)
Albumin: 3 g/dL — ABNORMAL LOW (ref 3.5–5.0)
Alkaline Phosphatase: 183 U/L — ABNORMAL HIGH (ref 38–126)
Bilirubin, Direct: 2.4 mg/dL — ABNORMAL HIGH (ref 0.0–0.2)
Indirect Bilirubin: 3.6 mg/dL — ABNORMAL HIGH (ref 0.3–0.9)
Total Bilirubin: 6 mg/dL — ABNORMAL HIGH (ref 0.0–1.2)
Total Protein: 5 g/dL — ABNORMAL LOW (ref 6.5–8.1)

## 2023-09-02 LAB — CBC
HCT: 31.6 % — ABNORMAL LOW (ref 39.0–52.0)
Hemoglobin: 10.3 g/dL — ABNORMAL LOW (ref 13.0–17.0)
MCH: 28.2 pg (ref 26.0–34.0)
MCHC: 32.6 g/dL (ref 30.0–36.0)
MCV: 86.6 fL (ref 80.0–100.0)
Platelets: 76 10*3/uL — ABNORMAL LOW (ref 150–400)
RBC: 3.65 MIL/uL — ABNORMAL LOW (ref 4.22–5.81)
RDW: 17.7 % — ABNORMAL HIGH (ref 11.5–15.5)
WBC: 7 10*3/uL (ref 4.0–10.5)
nRBC: 2.1 % — ABNORMAL HIGH (ref 0.0–0.2)

## 2023-09-02 LAB — COOXEMETRY PANEL
Carboxyhemoglobin: 1.7 % — ABNORMAL HIGH (ref 0.5–1.5)
Methemoglobin: <0.7 % (ref 0.0–1.5)
O2 Saturation: 70.1 %
Total hemoglobin: 10.7 g/dL — ABNORMAL LOW (ref 12.0–16.0)

## 2023-09-02 LAB — BASIC METABOLIC PANEL
Anion gap: 19 — ABNORMAL HIGH (ref 5–15)
BUN: 109 mg/dL — ABNORMAL HIGH (ref 8–23)
CO2: 25 mmol/L (ref 22–32)
Calcium: 7.5 mg/dL — ABNORMAL LOW (ref 8.9–10.3)
Chloride: 91 mmol/L — ABNORMAL LOW (ref 98–111)
Creatinine, Ser: 4.16 mg/dL — ABNORMAL HIGH (ref 0.61–1.24)
GFR, Estimated: 14 mL/min — ABNORMAL LOW (ref >60)
Glucose, Bld: 121 mg/dL — ABNORMAL HIGH (ref 70–99)
Potassium: 3.3 mmol/L — ABNORMAL LOW (ref 3.5–5.1)
Sodium: 135 mmol/L (ref 135–145)

## 2023-09-02 LAB — PROTIME-INR
INR: 3.8 — ABNORMAL HIGH (ref 0.8–1.2)
Prothrombin Time: 38 s — ABNORMAL HIGH (ref 11.4–15.2)

## 2023-09-02 LAB — LACTIC ACID, PLASMA: Lactic Acid, Venous: 2.2 mmol/L (ref 0.5–1.9)

## 2023-09-02 LAB — APTT: aPTT: 64 s — ABNORMAL HIGH (ref 24–36)

## 2023-09-02 MED ORDER — HEPARIN SODIUM (PORCINE) 5000 UNIT/ML IJ SOLN: 5000.0000 [IU] | Freq: Three times a day (TID) | INTRAMUSCULAR | Status: DC

## 2023-09-02 MED ORDER — HEPARIN SODIUM (PORCINE) 5000 UNIT/ML IJ SOLN
5000.0000 [IU] | Freq: Three times a day (TID) | INTRAMUSCULAR | Status: DC
  Administered 2023-09-02 – 2023-09-05 (×9): 5000 [IU] via SUBCUTANEOUS
  Filled 2023-09-02 (×9): qty 1

## 2023-09-02 MED ORDER — VITAMIN K1 10 MG/ML IJ SOLN
10.0000 mg | Freq: Every day | INTRAVENOUS | Status: AC
  Administered 2023-09-02 – 2023-09-04 (×3): 10 mg via INTRAVENOUS
  Filled 2023-09-02 (×4): qty 1

## 2023-09-02 MED ORDER — POTASSIUM CHLORIDE 20 MEQ PO PACK
40.0000 meq | PACK | Freq: Four times a day (QID) | ORAL | Status: AC
  Administered 2023-09-02 (×2): 40 meq via ORAL
  Filled 2023-09-02 (×2): qty 2

## 2023-09-02 NOTE — Progress Notes (Addendum)
 [facility] NEPHROLOGY PROGRESS NOTE  Assessment/ Plan: Pt is a 83 y.o. yo male with medical history significant for CAD, systolic CHF status post biventricular ICD, HLD, HTN, moderate MR admitted with shortness of breath and CHF exacerbation.  # AKI on CKD 3b - b/l creatinine 1.8- 2.2.  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 CT showing normal kidneys w/o obstruction. UA negative. CXR clear.   Patient was moved to ICU for inotrope/dobutamine  treatment.  The urine output increased to more than 3 L and patient is clinically feeling much better.  The renal panel lab is pending from today.  Expect gradual renal recovery.  No urgent need for dialysis today however, I'm worried that he may not tolerate dialysis long-term, in that case I will recommend palliative hospice care.  Discussed with the cardiology team as well as primary team.  # Hyperkalemia : Improved with medical management.  Follow lab.    # Transaminitis likely due to shock liver.  Enzymes are trending down.  Seen by GI.  #HFrEF - last echo dec 2024 w/ EF of 20-25%.  #H/o severe pHTN #Trop - started on IV heparin , Cards following  Subjective: Seen and examined at bedside.  Moved to ICU for inotrope.  He is sitting on chair, looks comfortable.  Urine output is around 3 L.  Denies chest pain or shortness of breath. Objective Vital signs in last 24 hours: Vitals:   09/02/23 0645 09/02/23 0700 09/02/23 0715 09/02/23 0730  BP: (!) 102/57 (!) 105/57 112/61 113/60  Pulse: 81 75 82 79  Resp: 15 15 (!) 21 17  Temp:      TempSrc:      SpO2: 97% 97% 97% 98%  Weight:      Height:       Weight change: -3.103 kg  Intake/Output Summary (Last 24 hours) at 09/02/2023 0819 Last data filed at 09/02/2023 0700 Gross per 24 hour  Intake 1259.8 ml  Output 3250 ml  Net -1990.2 ml       Labs: RENAL PANEL Recent Labs  Lab 08/30/23 1144 08/30/23 2259  08/31/23 0222 08/31/23 1029 09/01/23 0250 09/01/23 0939  NA 135 134* 134*  --  133*  --   K 5.3* 5.5* 5.3*  --  4.3  --   CL 96* 95* 95*  --  88*  --   CO2 16* 14* 11*  --  18*  --   GLUCOSE 94 67* 73  --  96  --   BUN 77* 89* 94*  --  106*  --   CREATININE 4.10* 4.50* 4.48*  --  4.73*  --   CALCIUM  8.6* 8.1* 7.8*  --  7.2*  --   ALBUMIN 3.5 3.4*  --  3.1*  --  3.2*    Liver Function Tests: Recent Labs  Lab 08/30/23 2259 08/31/23 1029 09/01/23 0939  AST 3,596* 4,105* 3,190*  ALT 3,201* 3,390* 3,386*  ALKPHOS 150* 160* 183*  BILITOT 7.0* 6.3* 7.2*  PROT 5.7* 5.2* 5.4*  ALBUMIN 3.4* 3.1* 3.2*   Recent Labs  Lab 08/30/23 1144  LIPASE 53*   Recent Labs  Lab 08/30/23 1918  AMMONIA 29   CBC: Recent Labs    08/30/23 1144 08/31/23 0222 08/31/23 0522 09/01/23 0250 09/02/23 0519  HGB 11.5* 10.7* 10.8* 10.8* 10.3*  MCV 92.6 92.9 90.7 88.3 86.6  FERRITIN  --   --  119  --   --  TIBC  --   --  448  --   --   IRON  --   --  56  --   --     Cardiac Enzymes: Recent Labs  Lab 08/31/23 0522  CKTOTAL 506*   CBG: No results for input(s): GLUCAP in the last 168 hours.  Iron Studies:  Recent Labs    08/31/23 0522  IRON 56  TIBC 448  FERRITIN 119   Studies/Results: US  EKG SITE RITE Result Date: 09/01/2023 If Site Rite image not attached, placement could not be confirmed due to current cardiac rhythm.  ECHOCARDIOGRAM COMPLETE Result Date: 08/31/2023    ECHOCARDIOGRAM REPORT   Patient Name:   Tommy Ward [facility] Date of Exam: 08/31/2023 Medical Rec #:  [id number]         Height:       69.0 in Accession #:    [id number]        Weight:       180.9 lb Date of Birth:  04-05-1941          BSA:          1.980 m Patient Age:    82 years          BP:           104/53 mmHg Patient Gender: M                 HR:           70 bpm. Exam Location:  Inpatient Procedure: 2D Echo, 3D Echo, Cardiac Doppler, Color Doppler, Strain Analysis and            Intracardiac Opacification Agent  Indications:    CHF  History:        Patient has prior history of Echocardiogram examinations, most                 recent 08/06/2023. Cardiomyopathy, CHF and HFrEF, NSTEMI and                 CAD, Pulmonary HTN, H/O Cancer and Carotid Disease; Risk                 Factors:Hypertension, Dyslipidemia and Current Smoker.  Sonographer:    Tillman Nora RVT RCS Referring Phys: KARDIE TOBB IMPRESSIONS  1. Left ventricular ejection fraction, by estimation, is <20%. The left ventricle has severely decreased function. The left ventricle demonstrates global hypokinesis. The left ventricular internal cavity size was severely dilated.  2. Right ventricular systolic function mild to moderately reduced. The right ventricular size is mildly enlarged.  3. Left atrial size was moderately dilated.  4. Right atrial size was severely dilated.  5. Functional MR, at least moderate in severity . The mitral valve is normal in structure.  6. Tricuspid valve regurgitation is severe.  7. The aortic valve is tricuspid. Aortic valve regurgitation is not visualized. Aortic valve sclerosis/calcification is present, without any evidence of aortic stenosis.  8. The inferior vena cava is dilated in size with <50% respiratory variability, suggesting right atrial pressure of 15 mmHg. Comparison(s): EF 25-30%. FINDINGS  Left Ventricle: Left ventricular ejection fraction, by estimation, is <20%. The left ventricle has severely decreased function. The left ventricle demonstrates global hypokinesis. Definity  contrast agent was given IV to delineate the left ventricular endocardial borders. The left ventricular internal cavity size was severely dilated. There is no left ventricular hypertrophy. Right Ventricle: The right ventricular size is mildly enlarged. Right vetricular wall thickness was not assessed.  Right ventricular systolic function mild to moderately reduced. Left Atrium: Left atrial size was moderately dilated. Right Atrium: Right atrial size was  severely dilated. Pericardium: There is no evidence of pericardial effusion. Mitral Valve: Functional MR, at least moderate in severity. The mitral valve is normal in structure. Tricuspid Valve: The tricuspid valve is normal in structure. Tricuspid valve regurgitation is severe. Aortic Valve: The aortic valve is tricuspid. Aortic valve regurgitation is not visualized. Aortic valve sclerosis/calcification is present, without any evidence of aortic stenosis. Aortic valve mean gradient measures 3.0 mmHg. Aortic valve peak gradient measures 5.2 mmHg. Aortic valve area, by VTI measures 2.31 cm. Pulmonic Valve: The pulmonic valve was normal in structure. Pulmonic valve regurgitation is trivial. Aorta: The aortic root is normal in size and structure and the aortic root and ascending aorta are structurally normal, with no evidence of dilitation. Venous: The inferior vena cava is dilated in size with less than 50% respiratory variability, suggesting right atrial pressure of 15 mmHg. IAS/Shunts: No atrial level shunt detected by color flow Doppler. Additional Comments: A device lead is visualized.  LEFT VENTRICLE PLAX 2D LVIDd:         6.47 cm LVIDs:         6.10 cm LV PW:         1.07 cm LV IVS:        1.03 cm LVOT diam:     2.20 cm   3D Volume EF: LV SV:         50        3D EF:        25 % LV SV Index:   25        LV EDV:       356 ml LVOT Area:     3.80 cm  LV ESV:       268 ml                          LV SV:        88 ml RIGHT VENTRICLE             IVC RV Basal diam:  4.40 cm     IVC diam: 2.70 cm RV S prime:     12.20 cm/s TAPSE (M-mode): 1.6 cm LEFT ATRIUM             Index        RIGHT ATRIUM           Index LA diam:        4.60 cm 2.32 cm/m   RA Area:     28.80 cm LA Vol (A2C):   93.1 ml 47.03 ml/m  RA Volume:   98.70 ml  49.86 ml/m LA Vol (A4C):   75.3 ml 38.04 ml/m LA Biplane Vol: 96.1 ml 48.54 ml/m  AORTIC VALVE                    PULMONIC VALVE AV Area (Vmax):    2.38 cm     PR End Diast Vel: 0.92 msec  AV Area (Vmean):   2.22 cm AV Area (VTI):     2.31 cm AV Vmax:           114.00 cm/s AV Vmean:          74.600 cm/s AV VTI:            0.216 m AV Peak Grad:  5.2 mmHg AV Mean Grad:      3.0 mmHg LVOT Vmax:         71.30 cm/s LVOT Vmean:        43.500 cm/s LVOT VTI:          0.131 m LVOT/AV VTI ratio: 0.61  AORTA Ao Root diam: 2.60 cm Ao Asc diam:  3.20 cm MITRAL VALVE               TRICUSPID VALVE MV Area (PHT): 4.39 cm    TR Peak grad:   23.2 mmHg MV Decel Time: 173 msec    TR Vmax:        241.00 cm/s MV E velocity: 41.40 cm/s MV A velocity: 66.60 cm/s  SHUNTS MV E/A ratio:  0.62        Systemic VTI:  0.13 m                            Systemic Diam: 2.20 cm Vina Gull MD Electronically signed by Vina Gull MD Signature Date/Time: 08/31/2023/3:07:15 PM    Final     Medications: Infusions:  bivalirudin  (ANGIOMAX ) 250 mg in sodium chloride  0.9 % 500 mL (0.5 mg/mL) infusion 0.05 mg/kg/hr (09/02/23 0400)   DOBUTamine  2.5 mcg/kg/min (09/02/23 0400)    Scheduled Medications:  aspirin  EC  81 mg Oral Daily   Chlorhexidine  Gluconate Cloth  6 each Topical Daily   furosemide   80 mg Intravenous Q8H   pantoprazole   40 mg Oral Daily   sodium chloride  flush  10-40 mL Intracatheter Q12H   tamsulosin   0.4 mg Oral Daily    have reviewed scheduled and prn medications.  Physical Exam: General:NAD, comfortable Heart:RRR, s1s2 nl Lungs: Basal decreased breath sound, no increased work of breathing Abdomen:soft, Non-tender, non-distended Extremities:No edema Neurology: Alert awake and following commands  Tanna Loeffler [employer] 09/02/2023,8:19 AM  LOS: 3 days

## 2023-09-02 NOTE — Progress Notes (Signed)
 Patient ID: Tommy Ward, male   DOB: 1941/06/24, 83 y.o.   MRN: [id number]     Advanced Heart Failure Rounding Note  Cardiologist: Vinie JAYSON Maxcy, MD  Chief Complaint: shortness of breath Subjective:    Co-ox 70% on dobutamine  2.5 mcg/kg/min.  CVP 13 this morning, I/Os net negative 1990 and weight down with Lasix  80 mg IV every 8 hrs.    Creatinine lower today at 4.7 => 4.16.  LFTs trending down.  Last lactate yesterday was 2.0.   Plts 65 => 76, on bivalirudin  gtt.    Objective:   Weight Range: 82.4 kg Body mass index is 26.83 kg/m.   Vital Signs:   Temp:  [97.5 F (36.4 C)-98.4 F (36.9 C)] 97.6 F (36.4 C) (01/14 0400) Pulse Rate:  [69-84] 80 (01/14 0800) Resp:  [15-28] 16 (01/14 0800) BP: (97-125)/(50-107) 111/71 (01/14 0800) SpO2:  [84 %-98 %] 94 % (01/14 0800) Weight:  [82.4 kg] 82.4 kg (01/14 0500) Last BM Date : 09/01/23  Weight change: Filed Weights   08/31/23 0509 09/01/23 0443 09/02/23 0500  Weight: 82.1 kg 85.5 kg 82.4 kg    Intake/Output:   Intake/Output Summary (Last 24 hours) at 09/02/2023 0848 Last data filed at 09/02/2023 0700 Gross per 24 hour  Intake 1259.8 ml  Output 3250 ml  Net -1990.2 ml      Physical Exam    General:  Well appearing. No resp difficulty HEENT: Normal Neck: Supple. JVP 12-14 cm. Carotids 2+ bilat; no bruits. No lymphadenopathy or thyromegaly appreciated. Cor: PMI nondisplaced. Regular rate & rhythm. No rubs, gallops or murmurs. Lungs: Clear Abdomen: Soft, nontender, nondistended. No hepatosplenomegaly. No bruits or masses. Good bowel sounds. Extremities: No cyanosis, clubbing, rash, edema Neuro: Alert & orientedx3, cranial nerves grossly intact. moves all 4 extremities w/o difficulty. Affect pleasant   Telemetry   A-BiV sequential pacing (personally reviewed)  Labs    CBC Recent Labs    08/30/23 1144 08/31/23 0222 09/01/23 0250 09/02/23 0519  WBC 11.5*   < > 10.0 7.0  NEUTROABS 9.1*  --   --   --    HGB 11.5*   < > 10.8* 10.3*  HCT 37.7*   < > 34.0* 31.6*  MCV 92.6   < > 88.3 86.6  PLT 166   < > 65* 76*   < > = values in this interval not displayed.   Basic Metabolic Panel Recent Labs    98/86/74 0250 09/02/23 0519  NA 133* 135  K 4.3 3.3*  CL 88* 91*  CO2 18* 25  GLUCOSE 96 121*  BUN 106* 109*  CREATININE 4.73* 4.16*  CALCIUM  7.2* 7.5*   Liver Function Tests Recent Labs    09/01/23 0939 09/02/23 0519  AST 3,190* 1,868*  ALT 3,386* 2,815*  ALKPHOS 183* 183*  BILITOT 7.2* 6.0*  PROT 5.4* 5.0*  ALBUMIN 3.2* 3.0*   Recent Labs    08/30/23 1144  LIPASE 53*   Cardiac Enzymes Recent Labs    08/31/23 0522  CKTOTAL 506*    BNP: BNP (last 3 results) Recent Labs    05/28/23 1444 08/04/23 1411 08/30/23 1144  BNP 1,737.8* 1,327.6* 3,686.6*    ProBNP (last 3 results) No results for input(s): PROBNP in the last 8760 hours.   D-Dimer No results for input(s): DDIMER in the last 72 hours. Hemoglobin A1C No results for input(s): HGBA1C in the last 72 hours. Fasting Lipid Panel No results for input(s): CHOL, HDL, LDLCALC, TRIG, CHOLHDL, LDLDIRECT  in the last 72 hours. Thyroid Function Tests No results for input(s): TSH, T4TOTAL, T3FREE, THYROIDAB in the last 72 hours.  Invalid input(s): FREET3  Other results:   Imaging    US  EKG SITE RITE Result Date: 09/01/2023 If Site Rite image not attached, placement could not be confirmed due to current cardiac rhythm.    Medications:     Scheduled Medications:  aspirin  EC  81 mg Oral Daily   Chlorhexidine  Gluconate Cloth  6 each Topical Daily   furosemide   80 mg Intravenous Q8H   heparin  injection (subcutaneous)  5,000 Units Subcutaneous Q8H   pantoprazole   40 mg Oral Daily   potassium chloride   40 mEq Oral Q6H   sodium chloride  flush  10-40 mL Intracatheter Q12H   tamsulosin   0.4 mg Oral Daily    Infusions:  DOBUTamine  2.5 mcg/kg/min (09/02/23 0400)    PRN  Medications: mouth rinse, sodium chloride  flush    Assessment/Plan   1. Cardiogenic shock: Ischemic cardiomyopathy.  St Jude CRT-D device.  Admitted with dyspnea, fatigue.   EF has ranged from <20% to 20-25% in the past.  Echo this admission with EF < 20%, severe LV dilation, mild-moderate RV dysfunction, moderate MR, IVC dilated.  Patient was admitted with AKI, markedly elevated LFTs, lactate 8.2 (4.1 today).  HS-TnI 3487 but no significant trend.  Cardiogenic shock explains his presentation.  Patient also had not been effectively BiV pacing for at least a couple of months, this may have played a role in his decompensation => device was interrogated by rep and adjusted, he is now BiV pacing effectively. On exam, he is volume overloaded with CVP 13, but he diuresed well on Lasix  80 mg IV tid.  SBP stable in 100s.  Creatinine 4.7 => 4.16 and LFTs trending down on dobutamine  2.5.  Co-ox 75% today.  - Continue dobutamine  2.5 mcg/kg/min.  - Continue Lasix  80 mg IV every 8 hrs today.  - Send lactate to ensure clearance.   - At 83 years old and with renal failure, not a candidate for advanced therapies or mechanical support. I discussed this with his family. He will be DNR and I will arrange for palliative care consultation.  2. CAD: Occluded mid LAD and 80% proximal stenosis in nondominant RCA in 4/22. No intervention. HS-TnI elevated at 3487 but no trend.  No chest pain.  May be demand ischemia from profound cardiogenic shock (see LFTs, AKI), but regardless would not cath with no CP and AKI.  - Continue ASA - Statin on hold with elevated LFTs, restart eventually.  - Can stop bivalirudin , will use [place] heparin  for DVT prophylaxis.  3. AKI on CKD stage 3: Baseline creatinine 2, up to 4.7 here.  Now trending down, 4.16 today.  Suspect ATN due to cardiogenic shock.  - Support CO with dobutamine .  - Volume overloaded, continue IV Lasix .   - Not a good long-term HD candidate with end stage HF.  4. Elevated  LFTs: Suspect shock liver.  Abdominal US  without obvious cirrhosis, surgically absent gallbladder. LFTs now trending down.  5. Thrombocytopenia: Plts dropped immediately after admission.  Think this was due to shock/inflammation.  Now rising, 76K today.  Doubt HIT.  - Stop bivalirudin , can start [place] heparin .  6. Mobilize with PT/OT.   CRITICAL CARE Performed by: Ezra Shuck  Total critical care time: 35 minutes  Critical care time was exclusive of separately billable procedures and treating other patients.  Critical care was necessary to treat or prevent  imminent or life-threatening deterioration.  Critical care was time spent personally by me on the following activities: development of treatment plan with patient and/or surrogate as well as nursing, discussions with consultants, evaluation of patient's response to treatment, examination of patient, obtaining history from patient or surrogate, ordering and performing treatments and interventions, ordering and review of laboratory studies, ordering and review of radiographic studies, pulse oximetry and re-evaluation of patient's condition.    Length of Stay: 3  Ezra Shuck, MD  09/02/2023, 8:48 AM  Advanced Heart Failure Team Pager [phone] (M-F; 7a - 5p)  Please contact [facility] for night-coverage after hours (5p -7a ) and weekends on [email or web address]

## 2023-09-02 NOTE — Progress Notes (Addendum)
 Gastroenterology Inpatient Follow Up    Subjective: Patient is able to ambulate in the hallway today.  He is feeling slightly better.  Objective: Vital signs in last 24 hours: Temp:  [97.6 F (36.4 C)-98.4 F (36.9 C)] 97.6 F (36.4 C) (01/14 0400) Pulse Rate:  [75-84] 80 (01/14 0800) Resp:  [15-28] 16 (01/14 0800) BP: (97-125)/(50-107) 111/71 (01/14 0800) SpO2:  [84 %-98 %] 94 % (01/14 0800) Weight:  [82.4 kg] 82.4 kg (01/14 0500) Last BM Date : 09/01/23  Intake/Output from previous day: 01/13 0701 - 01/14 0700 In: 1259.8 [P.O.:600; I.V.:659.8] Out: 3250 [Urine:3250] Intake/Output this shift: No intake/output data recorded.  General appearance: alert and cooperative Resp: no increased WOB Cardio: regular rate GI: non-tender, non-distended Extremities: 1+ BLE edema  Lab Results: Recent Labs    08/31/23 0522 09/01/23 0250 09/02/23 0519  WBC 10.5 10.0 7.0  HGB 10.8* 10.8* 10.3*  HCT 35.0* 34.0* 31.6*  PLT 85* 65* 76*   BMET Recent Labs    08/31/23 0222 09/01/23 0250 09/02/23 0519  NA 134* 133* 135  K 5.3* 4.3 3.3*  CL 95* 88* 91*  CO2 11* 18* 25  GLUCOSE 73 96 121*  BUN 94* 106* 109*  CREATININE 4.48* 4.73* 4.16*  CALCIUM  7.8* 7.2* 7.5*   LFT Recent Labs    09/02/23 0519  PROT 5.0*  ALBUMIN 3.0*  AST 1,868*  ALT 2,815*  ALKPHOS 183*  BILITOT 6.0*  BILIDIR 2.4*  IBILI 3.6*   PT/INR Recent Labs    08/31/23 1029 09/02/23 0519  LABPROT 34.3* 38.0*  INR 3.4* 3.8*   Hepatitis Panel Recent Labs    08/30/23 1355  HEPBSAG NON REACTIVE  HCVAB NON REACTIVE  HEPAIGM NON REACTIVE  HEPBIGM NON REACTIVE   C-Diff No results for input(s): CDIFFTOX in the last 72 hours.  Studies/Results: US  EKG SITE RITE Result Date: 09/01/2023 If Site Rite image not attached, placement could not be confirmed due to current cardiac rhythm.   Medications: I have reviewed the patient's current medications. Scheduled:  aspirin  EC  81 mg Oral Daily    Chlorhexidine  Gluconate Cloth  6 each Topical Daily   furosemide   80 mg Intravenous Q8H   heparin  injection (subcutaneous)  5,000 Units Subcutaneous Q8H   pantoprazole   40 mg Oral Daily   potassium chloride   40 mEq Oral Q6H   sodium chloride  flush  10-40 mL Intracatheter Q12H   tamsulosin   0.4 mg Oral Daily   Continuous:  DOBUTamine  2.5 mcg/kg/min (09/02/23 0400)   phytonadione  (VITAMIN K ) 10 mg in dextrose  5 % 50 mL IVPB     PRN:  Assessment/Plan: 83 y.o. 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  Patient's TTE showed that his EF is less than 20%.  Patient's acute hepatitis panel and Tylenol  level were negative.  Iron studies suggest slight iron deficiency. ASMA nml.  CK is mildly elevated at 506.  Ceruloplasmin normal. Patient's ultrasound did not show any clear signs of cirrhosis and portal vein flow is patent with normal doppler blood flow.  All of his LFTs are now downtrending, suggesting that his ischemic hepatitis is improving and that his congestive hepatopathy is also improving on Lasix  injections.  Patient has been placed on heart failure therapies  including dobutamine  to help improve his heart function.  INR is elevated but will give him some vitamin K  to see if vitamin K  deficiency is contributing all to his elevated INR.  Patient's mental status is good today and he is ambulating in the hallway.  -Follow up results of ANA -Will give vitamin K  10 mg daily for 3 days -Continue to trend LFTs and INR over time -Since LFTs are improving, GI will sign off for now. Please call back if any new questions arise.   LOS: 3 days   Tommy Ward [facility] 09/02/2023, 10:37 AM

## 2023-09-02 NOTE — Progress Notes (Signed)
 PHARMACY - ANTICOAGULATION Pharmacy Consult for bivalirudin  Indication: chest pain/ACS Brief A/P: aPTT within goal range Continue bivalirudin  at current rate   Allergies  Allergen Reactions   Ezetimibe  Other (See Comments)    Myalgia   Gabapentin Itching and Other (See Comments)    Sore throat, breakouts   Statins Other (See Comments)    Myalgias (intolerance)    Patient Measurements: Height: 5' 9 (175.3 cm) Weight: 82.4 kg (181 lb 10.5 oz) IBW/kg (Calculated) : 70.7  Vital Signs: Temp: 97.6 F (36.4 C) (01/14 0400) Temp Source: Oral (01/14 0400) BP: 111/63 (01/14 0600) Pulse Rate: 80 (01/14 0600)  Labs: Recent Labs    08/30/23 1144 08/30/23 1605 08/30/23 2101 08/30/23 2259 08/31/23 0222 08/31/23 0522 08/31/23 0718 08/31/23 1029 08/31/23 1745 09/01/23 0250 09/02/23 0519  HGB 11.5*  --   --   --  10.7* 10.8*  --   --   --  10.8* 10.3*  HCT 37.7*  --   --   --  35.5* 35.0*  --   --   --  34.0* 31.6*  PLT 166  --   --   --  88* 85*  --   --   --  65* 76*  APTT  --   --   --   --   --   --   --   --  65* 77* 64*  LABPROT  --  26.3*  --   --   --   --   --  34.3*  --   --  38.0*  INR  --  2.4*  --   --   --   --   --  3.4*  --   --  3.8*  HEPARINUNFRC  --   --    < >  --  0.11*  --   --   --  0.16* 0.22*  --   CREATININE 4.10*  --   --  4.50* 4.48*  --   --   --   --  4.73*  --   CKTOTAL  --   --   --   --   --  506*  --   --   --   --   --   TROPONINIHS 3,487*  --   --   --   --  6,446* 3,733*  --   --   --   --    < > = values in this interval not displayed.    Estimated Creatinine Clearance: 12.2 mL/min (A) (by C-G formula based on SCr of 4.73 mg/dL (H)).   Assessment:  83 y.o. male with ACS for bivalirudin   Goal of Therapy:  aPTT 50-85 seconds Monitor platelets by anticoagulation protocol: Yes   Plan:  No change to bivalirudin   Cathlyn Arrant, PharmD, BCPS

## 2023-09-02 NOTE — Consult Note (Signed)
 Palliative Care Consult Note                                  Date: 09/02/2023   Patient Name: Tommy Ward  DOB: 05/22/1941  MRN: [id number]  Age / Sex: 83 y.o., male  PCP: Clemmie Nest, MD Referring Physician: Noralee Elidia Toribio DEWAINE  Reason for Consultation: Establishing goals of care  HPI/Patient Profile: 83 y.o. 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 In ED, pt was found to have BNP of 3686 with trop of 3487. Cr was elevated at 4.10 with K of 5.3. AST was also elevated at 3591 with ALT 2873 with TB of 5.8 and alk phos of 147.  He was admitted on 08/30/2023 with acute on chronic systolic heart failure, AKI on CKD stage IV, NSTEMI, shock liver, and others.   Palliative medicine was consulted for GOC conversations.  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   Cataract 11/2013   bilateral   Chronic renal insufficiency, stage III (moderate) (HCC) 11/27/2015   Chronic systolic heart failure (HCC)    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 02/24/2019   Coronary arteriosclerosis 07/06/2012   Decreased cardiac ejection fraction 05/20/2019   Depressed left ventricular ejection fraction 12/05/2020   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   Essential hypertension 07/06/2012   Familial  hyperlipidemia 02/24/2019   Gastroesophageal reflux disease without esophagitis 03/22/2018   GERD (gastroesophageal reflux disease)    HFrEF (heart failure with reduced ejection fraction) (HCC) 11/23/2020   Hypercalcemia of malignancy 02/16/2021   Hyperkalemia 08/24/2019   Hyperlipidemia    Hyperphosphatemia 11/25/2019   Hypertension    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   Left bundle branch block 07/06/2012   Malignant neoplasm of prostate (HCC)    Malignant neoplasm of prostate (HCC)    Metabolic bone disease 08/24/2019   Mixed hyperlipidemia 07/06/2012   Nonrheumatic mitral valve regurgitation 12/05/2020   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   Olecranon bursitis of left elbow 06/25/2018   Pre-diabetes 12/05/2020   Pulmonary hypertension (HCC) 12/05/2020   S/P radiation therapy > 12 wks ago 2010   prostate CA   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HCC)    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HCC) 12/05/2020   Tobacco use 12/05/2020   Vitamin D deficiency 08/24/2019   Vitamin D toxicity 07/24/2022    Subjective:   This NP Camellia Kays reviewed medical records, received report from team, assessed the patient and then meet at the patient's bedside to discuss diagnosis, prognosis, GOC, EOL wishes disposition and options.  I met with the patient at the bedside, although he was sleeping both times I attempted to see him and I elected to not wake him.  The first time I went to see him  his son and daughter-in-law were present and we agreed that I would come back in approximately 1 hour when his wife would be present.  When I came back the second time his wife, son, daughter, daughter-in-law were all present.  We excused ourselves to the conference room to speak while the patient was sleeping.   We meet to discuss diagnosis prognosis, GOC, EOL wishes, disposition and options. Concept of Palliative Care was introduced as  specialized medical care for people and their families living with serious illness.  If focuses on providing relief from the symptoms and stress of a serious illness.  The goal is to improve quality of life for both the patient and the family. Values and goals of care important to patient and family were attempted to be elicited.  Created space and opportunity for patient  and family to explore thoughts and feelings regarding current medical situation   Natural trajectory and current clinical status were discussed. Questions and concerns addressed. Patient  encouraged to call with questions or concerns.    Patient/Family Understanding of Illness: They understand he has a bad heart and has for some time.  They know his heart function was previously 20% or slightly better but it is now worse.  They note that he has a poor prognosis.  They note that he is not a candidate for hemodialysis.  We had extensive conversation detailing the specifics about his clinical situation.  Specifically he has heart failure with EF now less than 20%, not a candidate for advanced therapies, AKI on CKD 3 with baseline creatinine of 2 which is now 4.7 on admission and 4.16 today.  He is not a candidate for hemodialysis in the long-term.  We also discussed shock liver, also from his heart failure.  Not only does he have a significant bump in his transaminases but also a bump in alk phos, total bilirubin (7 on admission, 6.0 today) as well as a bump in his INR to 3.8 for which they are giving him vitamin K .  They understand, in essence, he is in multiorgan failure at this time.  They understand he is very sick and that there is a possibility he may not survive.  Life Review: The patient has been married to his wife for 30 years, has been with her for 32 years.  He has a son Riva and a daughter Randine.  Daughter-in-law Inocente is also a significant presence in his life.  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  He was also a truck driver at some point as well.  They describe him as when you meet him wants your friend for life.  He loves his family and loves music.  He is a spiritual person of sorts.  He also has a dog named Boo that is a Chihuahua and [employer].  Patient Values: Family, friends, functional independence.  Family is very clear that he would not want to go home if he could not care for himself.  Goals: To get better and be able to go home functionally independent.  However, they understand this may not be realistic.  Today's Discussion: In addition to discussions described above we had substantial discussion of various topics.  We did celebrate that he was able to ambulate today approximately 160 feet with a rolling walker and moderate assistance.  They note that he is much more alert and responsive  today compared to yesterday.  His son aptly describes this as the difference between answering versus communicating.    We spent a substantial mount of time talking about him, the things that he would want.  He is very private and proud person.  He would not want to be cared for.  His children had limited knowledge about his health conditions because he wanted it that way.  However, now all are understanding how seriously ill he has been and is.  Apparently he has not been very functional as of late, difficulty ambulating even into the grocery store.  This has been very taxing on him and his family.  We talked about the fact that he is in multiorgan failure.  While he has had small amount of improvement today, we discussed that this may not continue or it may not be enough for him to be able to live in a manner that he would find acceptable.  His wife shares that it is important to be honest with him.  She also shares that he would not want to be a bother.  Family also shares that if he could not be  functional and he would not want to continue to live in a state where he depended on others for care.  At the end of our conversation we discussed that, especially given the improvements today, we will continue to allow time for outcomes.  We would take it a day at a time and have appropriate conversations as needed.  They do understand that in the coming days we may have to have a conversation about comfort care and end-of-life care, including possibly hospice.  All this will depend on how he does clinically with medical support available to him.  I provided contact information for the palliative medicine team to the patient's family.  I told him I would come and check on him tomorrow as well.  I provided emotional and general support through therapeutic listening, empathy, sharing of stories, therapeutic touch, and other techniques. I answered all questions and addressed all concerns to the best of my ability.  Review of Systems  Unable to perform ROS   Objective:   Primary Diagnoses: Present on Admission:  Chronic kidney disease (CKD), stage IV (severe) (HCC)  Acute on chronic systolic heart failure (HCC)  Essential hypertension  Malignant neoplasm of prostate (HCC)  GERD (gastroesophageal reflux disease)   Physical Exam Vitals and nursing note reviewed.  Constitutional:      General: He is sleeping. He is not in acute distress. HENT:     Head: Normocephalic and atraumatic.  Cardiovascular:     Rate and Rhythm: Normal rate and regular rhythm.  Pulmonary:     Effort: Pulmonary effort is normal. No respiratory distress.     Vital Signs:  BP (!) 105/56   Pulse 80   Temp 97.6 F (36.4 C) (Oral)   Resp 15   Ht 5' 9 (1.753 m)   Wt 82.4 kg   SpO2 96%   BMI 26.83 kg/m   Palliative Assessment/Data: 60%    Advanced Care Planning:   Existing Vynca/ACP Documentation: None  Primary Decision Maker: PATIENT +/- Next of Kin  Code Status/Advance Care  Planning: DNR-limited  A discussion was had today regarding advanced directives. Concepts specific to code status, artifical feeding and hydration, continued IV antibiotics and rehospitalization was had.  The difference between a aggressive medical intervention path and a palliative comfort care path for this patient at this time was  had.   Decisions/Changes to ACP: None today  Assessment & Plan:   Impression: 83 year old male with acute presentation of chronic comorbidities as described above.  He is in a very tricky clinical situation with acute on chronic heart failure resulting in AKI on CKD and shock liver.  It appears that he has functional liver decline as well.  He is apparently mentally more clear today than he was yesterday, was able to ambulate 160 feet with a rolling walker and moderate support from therapy.  Family understands that he is very sick, possibility he may not survive this in the long-term.  They understand we may have to have comfort care conversations in the coming days.  At this time we will allow time for outcomes, take each day as it comes, have appropriate conversations as needed.  They are clear he would not want to live in a dependent state.  Overall long-term prognosis is poor  SUMMARY OF RECOMMENDATIONS   Remain DNR-limited, will confirm with the patient if possible tomorrow Continue full scope of care otherwise Time for outcomes Will discuss with the patient tomorrow the extent of his understanding Will try to gauge goals of care from the patient himself tomorrow Palliative medicine will continue to follow  Symptom Management:  Per primary team PMT is available to assist as needed  Prognosis:  Unable to determine  Discharge Planning:  To Be Determined   Discussed with: Patient's family, medical team, nursing team    Thank you for allowing us  to participate in the care of Quintavius Niebuhr PMT will continue to support holistically.  Time Total:  105 min  Detailed review of medical records (labs, imaging, vital signs), medically appropriate exam, discussed with treatment team, counseling and education to patient, family, & staff, documenting clinical information, medication management, coordination of care  Signed by: Camellia Kays, NP Palliative Medicine Team  Team Phone # [phone] (Nights/Weekends)  09/02/2023, 2:01 PM

## 2023-09-02 NOTE — Progress Notes (Signed)
 Physical Therapy Treatment Patient Details Name: Tommy Ward MRN: [id number] DOB: 08/20/1940 Today's Date: 09/02/2023   History of Present Illness 83 y.o. 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  Working diagnosis of heart failure decompensation complicated with NSTEMI and multiorgan failure.    PT Comments  Despite report of fatigue pt was able to amb 160' with eva walker and modA from PT. Per family pt with poor walking capability at home due to bilat foot neuropathy and shuffled gait pattern. Pt tolerated amb well with EVA walker, suspect due to increased support to allow pt weight shift onto UEs to allow for ability to clear L and R foot during swing phase. Acute PT to cont to follow.    If plan is discharge home, recommend the following: A lot of help with walking and/or transfers;Assistance with cooking/housework;Assist for transportation;Supervision due to cognitive status;Help with stairs or ramp for entrance   Can travel by private vehicle        Equipment Recommendations  Other (comment) (defer to post acute)    Recommendations for Other Services       Precautions / Restrictions Precautions Precautions: Fall Precaution Comments: fluid restriction, incontinent of urine Restrictions Weight Bearing Restrictions Per Provider Order: No     Mobility  Bed Mobility Overal bed mobility: Needs Assistance Bed Mobility: Sit to Supine       Sit to supine: Mod assist   General bed mobility comments: modA for LE management back into bed, max directional verbal cues    Transfers Overall transfer level: Needs assistance Equipment used: Rolling walker (2 wheels) Transfers: Sit to/from Stand Sit to Stand: Mod assist           General transfer comment: modA to power up, step by step verbal cues to sequence  transfer    Ambulation/Gait Ambulation/Gait assistance: Mod assist, +2 safety/equipment Gait Distance (Feet): 160 Feet Assistive device: Elyn Finder Gait Pattern/deviations: Step-through pattern, Decreased stride length, Trunk flexed Gait velocity: slow Gait velocity interpretation: <1.31 ft/sec, indicative of household ambulator   General Gait Details: spouse reports pt with difficulty walking at home due to bilat foot neuropathy and has a shuffled gait pattern at home however doesn't use an AD. Pt able to amb 160' with Elyn walker demonstrating increased ability to clear both R and L foot without difficulty, verbal cues to stand up right/tuck hips under, VSS, modA for walker management, 2nd person for chair follow and IV pole management   Stairs             Wheelchair Mobility     Tilt Bed    Modified Rankin (Stroke Patients Only)       Balance Overall balance assessment: Needs assistance Sitting-balance support: Feet supported Sitting balance-Leahy Scale: Fair     Standing balance support: Reliant on assistive device for balance, During functional activity, Bilateral upper extremity supported Standing balance-Leahy Scale: Poor Standing balance comment: pt requires external support                            Cognition Arousal: Alert Behavior During Therapy: Flat affect Overall Cognitive Status: No family/caregiver present to determine baseline cognitive functioning  General Comments: delayed processing, increased response time        Exercises      General Comments General comments (skin integrity, edema, etc.): VSS      Pertinent Vitals/Pain Pain Assessment Pain Assessment: No/denies pain    Home Living                          Prior Function            PT Goals (current goals can now be found in the care plan section) Acute Rehab PT Goals Patient Stated Goal: pt wants to go  home PT Goal Formulation: With patient Time For Goal Achievement: 09/14/23 Potential to Achieve Goals: Fair Progress towards PT goals: Progressing toward goals    Frequency    Min 1X/week      PT Plan      Co-evaluation              AM-PAC PT 6 Clicks Mobility   Outcome Measure  Help needed turning from your back to your side while in a flat bed without using bedrails?: A Little Help needed moving from lying on your back to sitting on the side of a flat bed without using bedrails?: A Lot Help needed moving to and from a bed to a chair (including a wheelchair)?: A Lot Help needed standing up from a chair using your arms (e.g., wheelchair or bedside chair)?: A Lot Help needed to walk in hospital room?: A Lot Help needed climbing 3-5 steps with a railing? : A Lot 6 Click Score: 13    End of Session Equipment Utilized During Treatment: Gait belt Activity Tolerance: Patient tolerated treatment well Patient left: in bed;with call bell/phone within reach;with bed alarm set Nurse Communication: Mobility status PT Visit Diagnosis: Unsteadiness on feet (R26.81);Other abnormalities of gait and mobility (R26.89)     Time: 9045-8978 PT Time Calculation (min) (ACUTE ONLY): 27 min  Charges:    $Gait Training: 8-22 mins $Therapeutic Activity: 8-22 mins PT General Charges $$ ACUTE PT VISIT: 1 Visit                     Norene Ames, PT, DPT Acute Rehabilitation Services Secure chat preferred Office #: [phone]    Norene CHRISTELLA Ames 09/02/2023, 2:47 PM

## 2023-09-02 NOTE — Plan of Care (Signed)
  Problem: Education: Goal: Knowledge of General Education information will improve Description: Including pain rating scale, medication(s)/side effects and non-pharmacologic comfort measures Outcome: Progressing   Problem: Health Behavior/Discharge Planning: Goal: Ability to manage health-related needs will improve Outcome: Progressing   Problem: Elimination: Goal: Will not experience complications related to urinary retention Outcome: Progressing   Problem: Pain Management: Goal: General experience of comfort will improve Outcome: Progressing   Problem: Safety: Goal: Ability to remain free from injury will improve Outcome: Progressing   Problem: Skin Integrity: Goal: Risk for impaired skin integrity will decrease Outcome: Progressing

## 2023-09-02 NOTE — Plan of Care (Signed)

## 2023-09-02 NOTE — Progress Notes (Signed)
 Progress Note   Patient: Tommy Ward FMW:[id number] DOB: 1941/06/29 DOA: 08/30/2023     3 DOS: the patient was seen and examined on 09/02/2023   Brief hospital course: Tommy Ward was admitted to the hospital with the working diagnosis of heart failure decompensation complicated with NSTEMI and multiorgan failure, cardiogenic shock.    83 y.o. 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 In ED, pt was found to have BNP of 3686 with trop of 3487. Cr was elevated at 4.10 with K of 5.3. AST was also elevated at 3591 with ALT 2873 with TB of 5.8 and alk phos of 147. CXR was notable for cardiomegaly. Abd CT with mild ascites, otherwise liver appeared unremarkable. No hydronephrosis and bladder appeared unremarkable. Pt was started on heparin  gtt and IV furosemide .   01/13 echocardiogram with reduced LV systolic function to less than 20% with signs of hypoperfusion. Transferred to ICU for inotropic support.  Not candidate for advance therapies or hemodialysis. Poor prognosis, CODE status changed to DNR.  01/14 stable on dobutamine  infusion and aggressive diuresis. Consulted palliative care services.   Assessment and Plan: * Acute on chronic systolic heart failure (HCC) Echocardiogram with significant reduction in LV systolic function with EF <20%, global hypokinesis, internal cavity with severe dilatation, RV with mild to moderate reduction in systolic function, RV with mild enlargement, RA  with severe dilatation, moderate function mitral regurgitation, severe TR.   Lactic acid 2.2  SVO2 70  Systolic blood pressure 100's  Urine output is 3,250 ml.   Continue dobutamine  at 4 mcg/kg/min  Furosemide  80 mg IV q8 hrs  Patient with poor prognosis, multiorgan failure. Not candidate for advanced therapies, if no response to IV inotropic  support may need to transition to comfort care.   Chronic kidney disease (CKD), stage IV (severe) (HCC) Renal function with serum cr at 4,16 with K at 3,3 and serum bicarbonate at 25,  Na 135   Continue hemodynamic support.  Aggressive diuresis with IV furosemide .  Likely not candidate for long term intermittent hemodialysis due to advanced heart failure.  Continue K correction with Kcl Follow up renal function and electrolytes including mg in am.   NSTEMI (non-ST elevated myocardial infarction) (HCC) Known to have coronary artery disease.  Currently with no chest pain.  Transitioned from IV to SQ  heparin .  Echocardiogram with non focal wall motion abnormalities.   Shock liver AST 1,868  ALT 2,815  T bil 6,0  INR 3.8   Plan to continue supportive medical therapy including inotropic support for heart failure.  Trial of vitamin K  IV for coagulopathy. Patient with asterixis or signs of hepatic encephalopathy.   Essential hypertension Continue blood pressure monitoring.    GERD (gastroesophageal reflux disease) Continue with pantoprazole .   Malignant neoplasm of prostate (HCC) Follow up as outpatient.        Subjective: Patient with no chest pain, reported improved dyspnea compared to yesterday and was able to ambulate in the hallway with therapy   Physical Exam: Vitals:   09/02/23 1130 09/02/23 1200 09/02/23 1230 09/02/23 1300  BP: (!) 106/57 (!) 105/56 (!) 106/59 (!) 105/56  Pulse: 80 80 78 80  Resp: 15 16 18 15   Temp:      TempSrc:      SpO2: 97% 97% 96% 96%  Weight:  Height:       Neurology awake and alert, very weak and deconditioned, ill looking appearing  ENT with positive pallor and icterus Cardiovascular with S1 and S2 present and regular with no gallops, rubs or murmurs Positive JVD No lower extremity edema, warm lower extremities  Respiratory with scattered rales with no wheezing or rhonchi  Abdomen with no distention   Data  Reviewed:    Family Communication: no family at the bedside   Disposition: Status is: Inpatient Remains inpatient appropriate because: critically ill on dobutamine  infusion   Planned Discharge Destination:  to be determined    Author: Elidia Toribio Furnace, MD 09/02/2023 1:29 PM  For on call review [email or web address].

## 2023-09-03 DIAGNOSIS — R570 Cardiogenic shock: Secondary | ICD-10-CM | POA: Diagnosis not present

## 2023-09-03 DIAGNOSIS — I214 Non-ST elevation (NSTEMI) myocardial infarction: Secondary | ICD-10-CM | POA: Diagnosis not present

## 2023-09-03 DIAGNOSIS — Z66 Do not resuscitate: Secondary | ICD-10-CM | POA: Diagnosis not present

## 2023-09-03 DIAGNOSIS — I5023 Acute on chronic systolic (congestive) heart failure: Secondary | ICD-10-CM | POA: Diagnosis not present

## 2023-09-03 DIAGNOSIS — Z7189 Other specified counseling: Secondary | ICD-10-CM | POA: Diagnosis not present

## 2023-09-03 LAB — HEPATIC FUNCTION PANEL
ALT: 2109 U/L — ABNORMAL HIGH (ref 0–44)
AST: 712 U/L — ABNORMAL HIGH (ref 15–41)
Albumin: 2.9 g/dL — ABNORMAL LOW (ref 3.5–5.0)
Alkaline Phosphatase: 157 U/L — ABNORMAL HIGH (ref 38–126)
Bilirubin, Direct: 1.8 mg/dL — ABNORMAL HIGH (ref 0.0–0.2)
Indirect Bilirubin: 3.4 mg/dL — ABNORMAL HIGH (ref 0.3–0.9)
Total Bilirubin: 5.2 mg/dL — ABNORMAL HIGH (ref 0.0–1.2)
Total Protein: 4.9 g/dL — ABNORMAL LOW (ref 6.5–8.1)

## 2023-09-03 LAB — CBC
HCT: 32.7 % — ABNORMAL LOW (ref 39.0–52.0)
Hemoglobin: 10.2 g/dL — ABNORMAL LOW (ref 13.0–17.0)
MCH: 27.3 pg (ref 26.0–34.0)
MCHC: 31.2 g/dL (ref 30.0–36.0)
MCV: 87.7 fL (ref 80.0–100.0)
Platelets: 76 10*3/uL — ABNORMAL LOW (ref 150–400)
RBC: 3.73 MIL/uL — ABNORMAL LOW (ref 4.22–5.81)
RDW: 17.6 % — ABNORMAL HIGH (ref 11.5–15.5)
WBC: 6 10*3/uL (ref 4.0–10.5)
nRBC: 1.7 % — ABNORMAL HIGH (ref 0.0–0.2)

## 2023-09-03 LAB — PROTIME-INR
INR: 1.7 — ABNORMAL HIGH (ref 0.8–1.2)
Prothrombin Time: 20.1 s — ABNORMAL HIGH (ref 11.4–15.2)

## 2023-09-03 LAB — BASIC METABOLIC PANEL
Anion gap: 18 — ABNORMAL HIGH (ref 5–15)
BUN: 96 mg/dL — ABNORMAL HIGH (ref 8–23)
CO2: 25 mmol/L (ref 22–32)
Calcium: 8.1 mg/dL — ABNORMAL LOW (ref 8.9–10.3)
Chloride: 95 mmol/L — ABNORMAL LOW (ref 98–111)
Creatinine, Ser: 3.48 mg/dL — ABNORMAL HIGH (ref 0.61–1.24)
GFR, Estimated: 17 mL/min — ABNORMAL LOW (ref >60)
Glucose, Bld: 138 mg/dL — ABNORMAL HIGH (ref 70–99)
Potassium: 3.6 mmol/L (ref 3.5–5.1)
Sodium: 138 mmol/L (ref 135–145)

## 2023-09-03 LAB — COOXEMETRY PANEL
Carboxyhemoglobin: 2 % — ABNORMAL HIGH (ref 0.5–1.5)
Methemoglobin: <0.7 % (ref 0.0–1.5)
O2 Saturation: 65.8 %
Total hemoglobin: 10.9 g/dL — ABNORMAL LOW (ref 12.0–16.0)

## 2023-09-03 LAB — MAGNESIUM: Magnesium: 2.4 mg/dL (ref 1.7–2.4)

## 2023-09-03 MED ORDER — POTASSIUM CHLORIDE 20 MEQ PO PACK
40.0000 meq | PACK | Freq: Once | ORAL | Status: AC
  Administered 2023-09-03: 40 meq via ORAL
  Filled 2023-09-03: qty 2

## 2023-09-03 MED ORDER — NEPRO/CARBSTEADY PO LIQD
237.0000 mL | Freq: Two times a day (BID) | ORAL | Status: DC
Start: 2023-09-03 — End: 2023-09-05
  Administered 2023-09-03 – 2023-09-05 (×4): 237 mL via ORAL

## 2023-09-03 MED ORDER — METOLAZONE 2.5 MG PO TABS
2.5000 mg | ORAL_TABLET | Freq: Once | ORAL | Status: AC
  Administered 2023-09-03: 2.5 mg via ORAL
  Filled 2023-09-03: qty 1

## 2023-09-03 MED ORDER — POTASSIUM CHLORIDE CRYS ER 20 MEQ PO TBCR
40.0000 meq | EXTENDED_RELEASE_TABLET | Freq: Once | ORAL | Status: AC
  Administered 2023-09-03: 40 meq via ORAL
  Filled 2023-09-03: qty 2

## 2023-09-03 MED ORDER — METOLAZONE 2.5 MG PO TABS: 2.5000 mg | ORAL_TABLET | Freq: Once | ORAL | Status: DC

## 2023-09-03 NOTE — Progress Notes (Signed)
 [facility] NEPHROLOGY PROGRESS NOTE  Assessment/ Plan: Pt is a 83 y.o. yo male with medical history significant for CAD, systolic CHF status post biventricular ICD, HLD, HTN, moderate MR admitted with shortness of breath and CHF exacerbation.  # AKI on CKD 3b - b/l creatinine 1.8- 2.2.  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 CT showing normal kidneys w/o obstruction. UA negative. CXR clear.   Patient was moved to ICU for inotrope/dobutamine  treatment.   Responding very well with inotrope/dobutamine  with increased urine output and improving serum creatinine level. Expect gradual renal recovery.  No urgent need for dialysis today however, I'm worried that he may not tolerate dialysis long-term, in that case I will recommend palliative hospice care.  I have discussed that with the patient's son and daughter-in-law at the bedside today were all in agreement.   # Hyperkalemia : Improved with medical management.  Follow lab.    # Transaminitis likely due to shock liver.  Enzymes are trending down.  Seen by GI.  #HFrEF - last echo dec 2024 w/ EF of 20-25%.  #H/o severe pHTN #Trop - started on IV heparin , Cards following  Subjective: Seen and examined at bedside.  Receiving dobutamine  with increased urine output of almost 3 L.  He reports feeling much better.  Denies nausea, vomiting, chest pain or shortness of breath.  In room air.  His family member including his son and daughter-in-law presented with him. Objective Vital signs in last 24 hours: Vitals:   09/03/23 0700 09/03/23 0715 09/03/23 0730 09/03/23 0800  BP: 104/62 (!) 100/38 (!) 103/57   Pulse: 80 81 80   Resp: (!) 21 20 18    Temp:    98.4 F (36.9 C)  TempSrc:    Oral  SpO2: 91% 93% 92%   Weight:      Height:       Weight change: -1.8 kg  Intake/Output Summary (Last 24 hours) at 09/03/2023 0949 Last data filed at 09/03/2023 0600 Gross per 24 hour   Intake 728.99 ml  Output 2900 ml  Net -2171.01 ml       Labs: RENAL PANEL Recent Labs  Lab 08/30/23 2259 08/31/23 0222 08/31/23 1029 09/01/23 0250 09/01/23 0939 09/02/23 0519 09/03/23 0443  NA 134* 134*  --  133*  --  135 138  K 5.5* 5.3*  --  4.3  --  3.3* 3.6  CL 95* 95*  --  88*  --  91* 95*  CO2 14* 11*  --  18*  --  25 25  GLUCOSE 67* 73  --  96  --  121* 138*  BUN 89* 94*  --  106*  --  109* 96*  CREATININE 4.50* 4.48*  --  4.73*  --  4.16* 3.48*  CALCIUM  8.1* 7.8*  --  7.2*  --  7.5* 8.1*  MG  --   --   --   --   --   --  2.4  ALBUMIN 3.4*  --  3.1*  --  3.2* 3.0* 2.9*    Liver Function Tests: Recent Labs  Lab 09/01/23 0939 09/02/23 0519 09/03/23 0443  AST 3,190* 1,868* 712*  ALT 3,386* 2,815* 2,109*  ALKPHOS 183* 183* 157*  BILITOT 7.2* 6.0* 5.2*  PROT 5.4* 5.0* 4.9*  ALBUMIN 3.2* 3.0* 2.9*   Recent Labs  Lab 08/30/23 1144  LIPASE 53*   Recent Labs  Lab 08/30/23 1918  AMMONIA 29   CBC: Recent Labs    08/31/23 0222 08/31/23 0522 09/01/23 0250 09/02/23 0519 09/03/23 0443  HGB 10.7* 10.8* 10.8* 10.3* 10.2*  MCV 92.9 90.7 88.3 86.6 87.7  FERRITIN  --  119  --   --   --   TIBC  --  448  --   --   --   IRON  --  56  --   --   --     Cardiac Enzymes: Recent Labs  Lab 08/31/23 0522  CKTOTAL 506*   CBG: No results for input(s): "GLUCAP" in the last 168 hours.  Iron Studies:  No results for input(s): "IRON", "TIBC", "TRANSFERRIN", "FERRITIN" in the last 72 hours.  Studies/Results: No results found.   Medications: Infusions:  DOBUTamine  2.5 mcg/kg/min (09/03/23 0943)   phytonadione  (VITAMIN K) 10 mg in dextrose  5 % 50 mL IVPB Stopped (09/02/23 1207)    Scheduled Medications:  aspirin  EC  81 mg Oral Daily   Chlorhexidine  Gluconate Cloth  6 each Topical Daily   feeding supplement (NEPRO CARB STEADY)  237 mL Oral BID BM   furosemide   80 mg Intravenous Q8H   heparin  injection (subcutaneous)  5,000 Units Subcutaneous Q8H    pantoprazole   40 mg Oral Daily   sodium chloride  flush  10-40 mL Intracatheter Q12H   tamsulosin   0.4 mg Oral Daily    have reviewed scheduled and prn medications.  Physical Exam: General:NAD, comfortable Heart:RRR, s1s2 nl Lungs: Clear bilateral, no increased work of breathing. Abdomen:soft, Non-tender, non-distended Extremities:No edema Neurology: Alert awake and following commands  Brentley Landfair Prasad Maddi Collar 09/03/2023,9:49 AM  LOS: 4 days

## 2023-09-03 NOTE — Progress Notes (Signed)
 PROGRESS NOTE    Tommy Ward  ZOX:[id number] DOB: [birth year] DOA: 08/30/2023 PCP: Harvest Lineman, MD  83/M with history of chronic systolic CHF, prostate cancer, CKD 4, presented to the ED with dyspnea on exertion, BNP 3686, troponin 3487, creatinine 4.1, AST 3591, ALT 2873, chest x-ray with cardiomegaly, abdominal CT with mild ascites, no hydronephrosis, admitted started on IV Lasix  and heparin  gtt. -1/13 echo noted EF<20% with signs of hypoperfusion, transferred to ICU, started on dobutamine , IV Lasix  -Poor prognosis, CODE STATUS changed to DNR -1/14, palliative care consulted   Subjective: -Feels better overall, breathing is improving  Assessment and Plan:  Acute on chronic systolic heart failure Cardiogenic shock -Known ischemic cardiomyopathy -Echo this admission with a EF<20%, moderate RV dysfunction, moderate MVR -Admitted with AKI and cardiogenic shock Heart failure team following, currently on IV Lasix , dobutamine  -Not a candidate for advanced therapies, palliative consulting, DNR  CAD -Not a candidate for cath with significant AKI regardless -Continue aspirin , statin on hold  NSTEMI versus demand ischemia -Troponin peaked at 3847 -No chest pain, suspected to be demand related from cardiogenic shock -Cards following, no plan for ischemic eval in the setting of AKI  AKI, CKD 3b Baseline creatinine around 2, creatinine peaked at 4.7, now improving on dobutamine  -Cardiorenal  Shock liver -LFTs improving, ultrasound without cirrhosis  Acute thrombocytopenia -Likely reactive in the setting of shock, will also check HIT antibody panel for completeness -Was on bivalirudin  for few days, switched back to heparin  yesterday -Monitor platelets  Malignant neoplasm of prostate (HCC) Follow up as outpatient.   DVT prophylaxis: Heparin  subcutaneous Code Status: DNR Family Communication: No family at bedside Disposition Plan: Stay in ICU today  Consultants:     Procedures:   Antimicrobials:    Objective: Vitals:   09/03/23 0700 09/03/23 0715 09/03/23 0730 09/03/23 0800  BP: 104/62 (!) 100/38 (!) 103/57   Pulse: 80 81 80   Resp: (!) 21 20 18    Temp:    98.4 F (36.9 C)  TempSrc:    Oral  SpO2: 91% 93% 92%   Weight:      Height:        Intake/Output Summary (Last 24 hours) at 09/03/2023 0950 Last data filed at 09/03/2023 0600 Gross per 24 hour  Intake 728.99 ml  Output 2900 ml  Net -2171.01 ml   Filed Weights   09/01/23 0443 09/02/23 0500 09/03/23 0500  Weight: 85.5 kg 82.4 kg 80.6 kg    Examination:  General exam: Appears calm and comfortable, AAOx3 HEENT: Positive JVD CVS: S1-S2, regular rhythm Lungs: Clear bilaterally Abdomen: Soft, nontender, bowel sounds present Extremities: 1+ edema Skin: No rashes Psychiatry:  Mood & affect appropriate.     Data Reviewed:   CBC: Recent Labs  Lab 08/30/23 1144 08/31/23 0222 08/31/23 0522 09/01/23 0250 09/02/23 0519 09/03/23 0443  WBC 11.5* 12.2* 10.5 10.0 7.0 6.0  NEUTROABS 9.1*  --   --   --   --   --   HGB 11.5* 10.7* 10.8* 10.8* 10.3* 10.2*  HCT 37.7* 35.5* 35.0* 34.0* 31.6* 32.7*  MCV 92.6 92.9 90.7 88.3 86.6 87.7  PLT 166 88* 85* 65* 76* 76*   Basic Metabolic Panel: Recent Labs  Lab 08/30/23 2259 08/31/23 0222 09/01/23 0250 09/02/23 0519 09/03/23 0443  NA 134* 134* 133* 135 138  K 5.5* 5.3* 4.3 3.3* 3.6  CL 95* 95* 88* 91* 95*  CO2 14* 11* 18* 25 25  GLUCOSE 67* 73 96 121* 138*  BUN  89* 94* 106* 109* 96*  CREATININE 4.50* 4.48* 4.73* 4.16* 3.48*  CALCIUM  8.1* 7.8* 7.2* 7.5* 8.1*  MG  --   --   --   --  2.4   GFR: Estimated Creatinine Clearance: 16.6 mL/min (A) (by C-G formula based on SCr of 3.48 mg/dL (H)). Liver Function Tests: Recent Labs  Lab 08/30/23 2259 08/31/23 1029 09/01/23 0939 09/02/23 0519 09/03/23 0443  AST 3,596* 4,105* 3,190* 1,868* 712*  ALT 3,201* 3,390* 3,386* 2,815* 2,109*  ALKPHOS 150* 160* 183* 183* 157*  BILITOT  7.0* 6.3* 7.2* 6.0* 5.2*  PROT 5.7* 5.2* 5.4* 5.0* 4.9*  ALBUMIN 3.4* 3.1* 3.2* 3.0* 2.9*   Recent Labs  Lab 08/30/23 1144  LIPASE 53*   Recent Labs  Lab 08/30/23 1918  AMMONIA 29   Coagulation Profile: Recent Labs  Lab 08/30/23 1605 08/31/23 1029 09/02/23 0519 09/03/23 0443  INR 2.4* 3.4* 3.8* 1.7*   Cardiac Enzymes: Recent Labs  Lab 08/31/23 0522  CKTOTAL 506*   BNP (last 3 results) No results for input(s): "PROBNP" in the last 8760 hours. HbA1C: No results for input(s): "HGBA1C" in the last 72 hours. CBG: No results for input(s): "GLUCAP" in the last 168 hours. Lipid Profile: No results for input(s): "CHOL", "HDL", "LDLCALC", "TRIG", "CHOLHDL", "LDLDIRECT" in the last 72 hours. Thyroid Function Tests: No results for input(s): "TSH", "T4TOTAL", "FREET4", "T3FREE", "THYROIDAB" in the last 72 hours. Anemia Panel: No results for input(s): "VITAMINB12", "FOLATE", "FERRITIN", "TIBC", "IRON", "RETICCTPCT" in the last 72 hours. Urine analysis:    Component Value Date/Time   COLORURINE AMBER (A) 08/31/2023 0345   APPEARANCEUR HAZY (A) 08/31/2023 0345   LABSPEC 1.012 08/31/2023 0345   PHURINE 5.0 08/31/2023 0345   GLUCOSEU NEGATIVE 08/31/2023 0345   HGBUR NEGATIVE 08/31/2023 0345   BILIRUBINUR NEGATIVE 08/31/2023 0345   KETONESUR NEGATIVE 08/31/2023 0345   PROTEINUR 30 (A) 08/31/2023 0345   NITRITE NEGATIVE 08/31/2023 0345   LEUKOCYTESUR NEGATIVE 08/31/2023 0345   Sepsis Labs: @LABRCNTIP (procalcitonin:4,lacticidven:4)  ) Recent Results (from the past 240 hours)  Resp panel by RT-PCR (RSV, Flu A&B, Covid) Anterior Nasal Swab     Status: None   Collection Time: 08/30/23 11:44 AM   Specimen: Anterior Nasal Swab  Result Value Ref Range Status   SARS Coronavirus 2 by RT PCR NEGATIVE NEGATIVE Final   Influenza A by PCR NEGATIVE NEGATIVE Final   Influenza B by PCR NEGATIVE NEGATIVE Final    Comment: (NOTE) The Xpert Xpress SARS-CoV-2/FLU/RSV plus assay is  intended as an aid in the diagnosis of influenza from Nasopharyngeal swab specimens and should not be used as a sole basis for treatment. Nasal washings and aspirates are unacceptable for Xpert Xpress SARS-CoV-2/FLU/RSV testing.  Fact Sheet for Patients: BloggerCourse.com  Fact Sheet for Healthcare Providers: SeriousBroker.it  This test is not yet approved or cleared by the [place]  FDA and has been authorized for detection and/or diagnosis of SARS-CoV-2 by FDA under an Emergency Use Authorization (EUA). This EUA will remain in effect (meaning this test can be used) for the duration of the COVID-19 declaration under Section 564(b)(1) of the Act, 21 U.S.C. section 360bbb-3(b)(1), unless the authorization is terminated or revoked.     Resp Syncytial Virus by PCR NEGATIVE NEGATIVE Final    Comment: (NOTE) Fact Sheet for Patients: BloggerCourse.com  Fact Sheet for Healthcare Providers: SeriousBroker.it  This test is not yet approved or cleared by the [place]  FDA and has been authorized for detection and/or diagnosis of SARS-CoV-2 by FDA  under an Emergency Use Authorization (EUA). This EUA will remain in effect (meaning this test can be used) for the duration of the COVID-19 declaration under Section 564(b)(1) of the Act, 21 U.S.C. section 360bbb-3(b)(1), unless the authorization is terminated or revoked.  Performed at [facility], [address] [place], [place] [address]   MRSA Next Gen by PCR, Nasal     Status: None   Collection Time: 09/01/23  6:34 PM   Specimen: Nasal Mucosa; Nasal Swab  Result Value Ref Range Status   MRSA by PCR Next Gen NOT DETECTED NOT DETECTED Final    Comment: (NOTE) The GeneXpert MRSA Assay (FDA approved for NASAL specimens only), is one component of a comprehensive MRSA colonization surveillance program. It is not intended to  diagnose MRSA infection nor to guide or monitor treatment for MRSA infections. Test performance is not FDA approved in patients less than 32 years old. Performed at [facility], [address], [place], [place] [address]      Radiology Studies: No results found.   Scheduled Meds:  aspirin  EC  81 mg Oral Daily   Chlorhexidine  Gluconate Cloth  6 each Topical Daily   feeding supplement (NEPRO CARB STEADY)  237 mL Oral BID BM   furosemide   80 mg Intravenous Q8H   heparin  injection (subcutaneous)  5,000 Units Subcutaneous Q8H   pantoprazole   40 mg Oral Daily   sodium chloride  flush  10-40 mL Intracatheter Q12H   tamsulosin   0.4 mg Oral Daily   Continuous Infusions:  DOBUTamine  2.5 mcg/kg/min (09/03/23 0943)   phytonadione  (VITAMIN K) 10 mg in dextrose  5 % 50 mL IVPB Stopped (09/02/23 1207)     LOS: 4 days    Time spent:    Deforest Fast, MD [facility]   09/03/2023, 9:50 AM

## 2023-09-03 NOTE — Progress Notes (Signed)
 Daily Progress Note   Patient Name: Tommy Ward       Date: 09/03/2023 DOB: April 20, 1941  Age: 83 y.o. MRN#: [id number] Attending Physician: Deforest Fast, MD Primary Care Physician: Harvest Lineman, MD Admit Date: 08/30/2023 Length of Stay: 4 days  Reason for Consultation/Follow-up: Establishing goals of care  HPI/Patient Profile:   83 y.o. 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 In ED, pt was found to have BNP of 3686 with trop of 3487. Cr was elevated at 4.10 with K of 5.3. AST was also elevated at 3591 with ALT 2873 with TB of 5.8 and alk phos of 147.  He was admitted on 08/30/2023 with acute on chronic systolic heart failure, AKI on CKD stage IV, NSTEMI, shock liver, and others.    Palliative medicine was consulted for GOC conversations.  Subjective:   Subjective: Chart Reviewed. Updates received. Patient Assessed. Created space and opportunity for patient  and family to explore thoughts and feelings regarding current medical situation.  Today's Discussion: Today saw the patient at bedside, no family was present.  Initially he was sleeping but easily awoken to the noise of me entering the room.  He made eye contact briefly but not maintain eye contact.  In general during my visit he did not seem very engaged, seems somewhat not wanting to talk much, his answers were very short.  He was oriented x 3, knows his full name that he is at [facility] and that the year is 2025.  When I asked him if he knows much about his hospitalization he says yes.  When asked to elaborate and ask whether the heart failure team had told him about why he is here he said "my heart."  When I asked him for more details about how his heart is he said "is not what it used to be."  Overall he says he is doing "okay".  I explained some more detail  about his heart is not doing well, we are unsure if we can get it to do any better.  However, with medications his kidneys and liver which had been failing due to his heart disease have made some small improvements.  He is answered with "okay".  I asked if he understands what I am explaining to him he says "yes".  I attempted to have a conversation about CODE STATUS but could not get enough engagement to be sure about him understanding the questions I am asking.  I deferred further CODE STATUS discussion to another time.  As per visit with family yesterday, they are on board with DNR-limited status.  I was getting the sense that he was getting annoyed with my presence and so I told him that I would let him rest and follow-up with his family.  I did run into his family on the unit and explained my encounter with him.  We discussed together ways that we could further engage with him and we agreed that meeting on Friday, hopefully with family present to allow them to attempt to bridge us  with him.  We shared that we will continue to care for him together to provide the best care possible and make decisions as needed.  I did update the family on his progress today and encouraged him to celebrate the small victories we have achieved.  They  are clear that he is still very sick and out of the woods, but they seem hopeful for possible continued small improvements.  I shared the palliative medicine will follow-up on Friday morning, unless we are needed before then. I provided emotional and general support through therapeutic listening, empathy, sharing of stories, and other techniques. I answered all questions and addressed all concerns to the best of my ability.  Review of Systems  Constitutional:  Positive for fatigue.       Denies pain in general  Cardiovascular:  Negative for chest pain.  Gastrointestinal:  Negative for abdominal pain, nausea and vomiting.    Objective:   Vital Signs:  BP (!) 111/58    Pulse 80   Temp 98.4 F (36.9 C) (Oral)   Resp 16   Ht 5\' 9"  (1.753 m)   Wt 80.6 kg   SpO2 94%   BMI 26.24 kg/m   Physical Exam Vitals and nursing note reviewed.  Constitutional:      General: He is sleeping. He is not in acute distress.    Appearance: He is ill-appearing.  HENT:     Head: Normocephalic and atraumatic.  Cardiovascular:     Rate and Rhythm: Normal rate.  Pulmonary:     Effort: Pulmonary effort is normal.  Abdominal:     General: Abdomen is flat.  Skin:    General: Skin is warm and dry.  Neurological:     General: No focal deficit present.     Mental Status: He is oriented to person, place, and time and easily aroused.  Psychiatric:        Mood and Affect: Mood normal.        Behavior: Behavior normal.     Palliative Assessment/Data: 60%    Existing Vynca/ACP Documentation: None  Assessment & Plan:   Impression: Present on Admission:  Chronic kidney disease (CKD), stage IV (severe) (HCC)  Acute on chronic systolic heart failure (HCC)  Essential hypertension  Malignant neoplasm of prostate (HCC)  GERD (gastroesophageal reflux disease)  83 year old male with acute presentation of chronic comorbidities as described above.  He is in a very tricky clinical situation with acute on chronic heart failure resulting in AKI on CKD and shock liver.  It appears that he has functional liver decline as well.  He is apparently mentally more clear today than he was yesterday, was able to ambulate 160 feet with a rolling walker and moderate support from therapy.  Family understands that he is very sick, possibility he may not survive this in the long-term.  They understand we may have to have comfort care conversations in the coming days.  At this time we will allow time for outcomes, take each day as it comes, have appropriate conversations as needed.  They are clear he would not want to live in a dependent state.  Overall long-term prognosis is poor.  SUMMARY OF  RECOMMENDATIONS   Remain DNR-limited, will attempt confirmation with patient another visit Continue full scope of care otherwise Ongoing time for outcomes Celebrate the small improvements, be prepared for risk of decompensation Palliative medicine will continue to follow I will Nexcede the patient on Friday, please call if we are needed before then  Symptom Management:  Per primary team PMT is available to assist as needed  Code Status: DNR-limited  Prognosis: Unable to determine  Discharge Planning: To Be Determined  Discussed with: Patient, family, medical team, nursing team  Thank you for allowing us  to participate in the  care of Tommy Ward [facility] PMT will continue to support holistically.  Time Total: 30 min  Detailed review of medical records (labs, imaging, vital signs), medically appropriate exam, discussed with treatment team, counseling and education to patient, family, & staff, documenting clinical information, medication management, coordination of care  Lizbeth Right, NP Palliative Medicine Team  Team Phone # [phone] (Nights/Weekends)  04/17/2021, 8:17 AM

## 2023-09-03 NOTE — Progress Notes (Signed)
 Occupational Therapy Treatment Patient Details Name: Tommy Ward MRN: [id number] DOB: 05-Jun-1941 Today's Date: 09/03/2023   History of present illness 83 y.o. 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  Working diagnosis of heart failure decompensation complicated with NSTEMI and multiorgan failure.   OT comments  OT session focused on training in techniques for increased safety and independence with ADLs and bed mobility and functional transfers during/in preparation for functional tasks. Pt currently demonstrates ability to complete UB ADLs with Set up to Mod assist, LB ADLs with Max assist, bed mobility with Mod assist, and functional transfers with a RW with Min to Mod assist. Pt currently requires cues throughout tasks for sequencing, safety, and technique. Pt participated well in session and is making progress toward goals. Pt VSS throughout session. Pt will benefit from continued acute skilled OT services to address deficits outlined below and increase safety and independence with functional tasks. Post acute discharge, pt will benefit from intensive inpatient skilled rehab services < 3 hours per day to maximize rehab potential.       If plan is discharge home, recommend the following:  Assist for transportation;Assistance with cooking/housework;A lot of help with bathing/dressing/bathroom;A lot of help with walking and/or transfers;Direct supervision/assist for financial management;Supervision due to cognitive status;Direct supervision/assist for medications management;Help with stairs or ramp for entrance   Equipment Recommendations  BSC/3in1    Recommendations for Other Services      Precautions / Restrictions Precautions Precautions: Fall Precaution Comments: fluid restriction, incontinent of  urine Restrictions Weight Bearing Restrictions Per Provider Order: No       Mobility Bed Mobility Overal bed mobility: Needs Assistance Bed Mobility: Supine to Sit     Supine to sit: Mod assist, HOB elevated, Used rails     General bed mobility comments: Mod assist to elevate trunk and max cues for technique throughout    Transfers Overall transfer level: Needs assistance Equipment used: Rolling walker (2 wheels) Transfers: Sit to/from Stand, Bed to chair/wheelchair/BSC Sit to Stand: Mod assist     Step pivot transfers: Min assist, Mod assist     General transfer comment: Mod assist to power up and Min assist once in standing with cues for safety, sequencing, and hand placement/technique     Balance Overall balance assessment: Needs assistance Sitting-balance support: Single extremity supported, No upper extremity supported, Feet supported Sitting balance-Leahy Scale: Fair     Standing balance support: Bilateral upper extremity supported, Single extremity supported, During functional activity, Reliant on assistive device for balance Standing balance-Leahy Scale: Poor Standing balance comment: pt requires external support                           ADL either performed or assessed with clinical judgement   ADL Overall ADL's : Needs assistance/impaired Eating/Feeding: Set up;Sitting   Grooming: Set up;Contact guard assist;Sitting           Upper Body Dressing : Moderate assistance;Sitting   Lower Body Dressing: Maximal assistance;Sit to/from stand   Toilet Transfer: Minimal assistance;Moderate assistance;BSC/3in1;Rolling walker (2 wheels);Cueing for safety;Cueing for sequencing (step-pivot transfer) Toilet Transfer Details (indicate cue type and reason): simulated to recliner           General ADL Comments: Pt with decreased activity tolerance, fatiguing quickly during tasks    Extremity/Trunk Assessment  Upper Extremity Assessment Upper  Extremity Assessment: Right hand dominant;Generalized weakness;RUE deficits/detail;LUE deficits/detail RUE Deficits / Details: mildly edematous hand and forearm; generalized weakness LUE Deficits / Details: generalized weakness   Lower Extremity Assessment Lower Extremity Assessment: Defer to PT evaluation        Vision       Perception     Praxis      Cognition Arousal: Alert Behavior During Therapy: Flat affect Overall Cognitive Status: Impaired/Different from baseline Area of Impairment: Safety/judgement, Problem solving, Following commands, Awareness, Attention                   Current Attention Level: Sustained   Following Commands: Follows one step commands consistently, Follows one step commands with increased time, Follows multi-step commands inconsistently, Follows multi-step commands with increased time Safety/Judgement: Decreased awareness of safety, Decreased awareness of deficits Awareness: Emergent Problem Solving: Slow processing, Decreased initiation, Requires verbal cues General Comments: AAOx4 and pleasant throughout session        Exercises      Shoulder Instructions       General Comments VSS on 0.5L continuous O2 through nasal cannula throughout session. Pt's wife, daughter, and son each present for a portion of session.    Pertinent Vitals/ Pain       Pain Assessment Pain Assessment: No/denies pain  Home Living                                          Prior Functioning/Environment              Frequency  Min 1X/week        Progress Toward Goals  OT Goals(current goals can now be found in the care plan section)  Progress towards OT goals: Progressing toward goals  Acute Rehab OT Goals Patient Stated Goal: to return home  Plan      Co-evaluation                 AM-PAC OT "6 Clicks" Daily Activity     Outcome Measure   Help from another person eating meals?: A Little Help from another  person taking care of personal grooming?: A Little Help from another person toileting, which includes using toliet, bedpan, or urinal?: A Lot Help from another person bathing (including washing, rinsing, drying)?: A Lot Help from another person to put on and taking off regular upper body clothing?: A Lot Help from another person to put on and taking off regular lower body clothing?: A Lot 6 Click Score: 14    End of Session Equipment Utilized During Treatment: Gait belt;Rolling walker (2 wheels);Oxygen  OT Visit Diagnosis: Unsteadiness on feet (R26.81);Muscle weakness (generalized) (M62.81);History of falling (Z91.81);Other symptoms and signs involving cognitive function   Activity Tolerance Patient tolerated treatment well;Patient limited by fatigue   Patient Left in chair;with call bell/phone within reach;with chair alarm set;with nursing/sitter in room;with family/visitor present   Nurse Communication Mobility status;Other (comment) (Pt's family requests check of banadage on R forearm due to family concern regarding tightness of bandage)        Time: 0454-0981 OT Time Calculation (min): 37 min  Charges: OT General Charges $OT Visit: 1 Visit OT Treatments $Self Care/Home Management : 8-22 mins $Therapeutic Activity: 8-22 mins  Tommy Ward "Tommy Ward., OTR/L, MA Acute Rehab [phone]   Tommy Ward 09/03/2023, 3:14 PM

## 2023-09-03 NOTE — Progress Notes (Addendum)
 Patient ID: Tommy Ward, male   DOB: 30-Apr-1941, 83 y.o.   MRN: [id number]     Advanced Heart Failure Rounding Note  Cardiologist: Hazle Lites, MD  Chief Complaint: Heart Failure Subjective:   Yesterday diuresed with IV lasix . Negative 2.1 liters.    Dobutamine  2.5 mcg/kg/min.    Creatinine lower today at 4.7 => 4.16=>3.5 .   Plts 65 => 76=>76    Tired after walking this morning.  Objective:   Weight Range: 80.6 kg Body mass index is 26.24 kg/m.   Vital Signs:   Temp:  [97.5 F (36.4 C)-98.1 F (36.7 C)] 98 F (36.7 C) (01/15 0400) Pulse Rate:  [78-86] 80 (01/15 0730) Resp:  [13-27] 18 (01/15 0730) BP: (83-124)/(38-89) 103/57 (01/15 0730) SpO2:  [88 %-98 %] 92 % (01/15 0730) Weight:  [80.6 kg] 80.6 kg (01/15 0500) Last BM Date : 09/01/23  Weight change: Filed Weights   09/01/23 0443 09/02/23 0500 09/03/23 0500  Weight: 85.5 kg 82.4 kg 80.6 kg    Intake/Output:   Intake/Output Summary (Last 24 hours) at 09/03/2023 0907 Last data filed at 09/03/2023 0600 Gross per 24 hour  Intake 728.99 ml  Output 2900 ml  Net -2171.01 ml    CVP 13-14   Physical Exam   General:  Appears weak.  No resp difficulty HEENT: normal Neck: supple. JVP 11-12. Carotids 2+ bilat; no bruits. No lymphadenopathy or thryomegaly appreciated. Cor: PMI nondisplaced. Regular rate & rhythm. No rubs, gallops or murmurs. Lungs: clear Abdomen: soft, nontender, nondistended. No hepatosplenomegaly. No bruits or masses. Good bowel sounds. Extremities: no cyanosis, clubbing, rash, R and LLE 1+ edema. + SCDs bilateral Neuro: alert & orientedx3, cranial nerves grossly intact. moves all 4 extremities w/o difficulty. Affect flat   Telemetry   BiV pacing.   Labs    CBC Recent Labs    09/02/23 0519 09/03/23 0443  WBC 7.0 6.0  HGB 10.3* 10.2*  HCT 31.6* 32.7*  MCV 86.6 87.7  PLT 76* 76*   Basic Metabolic Panel Recent Labs    40/98/11 0519 09/03/23 0443  NA 135 138  K 3.3* 3.6   CL 91* 95*  CO2 25 25  GLUCOSE 121* 138*  BUN 109* 96*  CREATININE 4.16* 3.48*  CALCIUM  7.5* 8.1*  MG  --  2.4   Liver Function Tests Recent Labs    09/02/23 0519 09/03/23 0443  AST 1,868* 712*  ALT 2,815* 2,109*  ALKPHOS 183* 157*  BILITOT 6.0* 5.2*  PROT 5.0* 4.9*  ALBUMIN 3.0* 2.9*   No results for input(s): "LIPASE", "AMYLASE" in the last 72 hours.  Cardiac Enzymes No results for input(s): "CKTOTAL", "CKMB", "CKMBINDEX", "TROPONINI" in the last 72 hours.   BNP: BNP (last 3 results) Recent Labs    05/28/23 1444 08/04/23 1411 08/30/23 1144  BNP 1,737.8* 1,327.6* 3,686.6*    ProBNP (last 3 results) No results for input(s): "PROBNP" in the last 8760 hours.   D-Dimer No results for input(s): "DDIMER" in the last 72 hours. Hemoglobin A1C No results for input(s): "HGBA1C" in the last 72 hours. Fasting Lipid Panel No results for input(s): "CHOL", "HDL", "LDLCALC", "TRIG", "CHOLHDL", "LDLDIRECT" in the last 72 hours. Thyroid Function Tests No results for input(s): "TSH", "T4TOTAL", "T3FREE", "THYROIDAB" in the last 72 hours.  Invalid input(s): "FREET3"  Other results:   Imaging    No results found.    Medications:     Scheduled Medications:  aspirin  EC  81 mg Oral Daily   Chlorhexidine   Gluconate Cloth  6 each Topical Daily   feeding supplement (NEPRO CARB STEADY)  237 mL Oral BID BM   furosemide   80 mg Intravenous Q8H   heparin  injection (subcutaneous)  5,000 Units Subcutaneous Q8H   pantoprazole   40 mg Oral Daily   potassium chloride   40 mEq Oral Once   sodium chloride  flush  10-40 mL Intracatheter Q12H   tamsulosin   0.4 mg Oral Daily    Infusions:  DOBUTamine  2.5 mcg/kg/min (09/03/23 0600)   phytonadione  (VITAMIN K) 10 mg in dextrose  5 % 50 mL IVPB Stopped (09/02/23 1207)    PRN Medications: mouth rinse, sodium chloride  flush    Assessment/Plan   1. Cardiogenic shock: Ischemic cardiomyopathy.  St Jude CRT-D device.  Admitted with  dyspnea, fatigue.   EF has ranged from <20% to 20-25% in the past.  Echo this admission with EF < 20%, severe LV dilation, mild-moderate RV dysfunction, moderate MR, IVC dilated.  Patient was admitted with AKI, markedly elevated LFTs, lactate 8.2 (4.1 today).  HS-TnI 3487 but no significant trend.  Cardiogenic shock explains his presentation.  Patient also had not been effectively BiV pacing for at least a couple of months, this may have played a role in his decompensation => device was interrogated by rep and adjusted, he is now BiV pacing effectively.  - Creatinine 4.7 => 4.16=>3.48. LFTs trending down.   - Check CO-OX. Continue dobutamine  2.5 mcg/kg/min.  - CVP 13-14. Continue Lasix  80 mg IV every 8 hrs today -Supp K.  - At 83 years old and with renal failure, not a candidate for advanced therapies or mechanical support. 2. CAD: Occluded mid LAD and 80% proximal stenosis in nondominant RCA in 4/22. No intervention. HS-TnI elevated at 3487 but no trend.  No chest pain.  May be demand ischemia from profound cardiogenic shock (see LFTs, AKI), but regardless would not cath with no CP and AKI.  - No chest pain.  - Continue ASA - Statin on hold with elevated LFTs, restart eventually.  - Now on  [place] heparin  for DVT prophylaxis.  3. AKI on CKD stage 3: Baseline creatinine 2, peaked at 4.7--> today 3.48  -Continue Dobutamine .  - Not a good long-term HD candidate with end stage HF.  4. Elevated LFTs: Suspect shock liver.  Abdominal US  without obvious cirrhosis, surgically absent gallbladder. LFTs now trending down.  5. Thrombocytopenia: Plts dropped immediately after admission.  Think this was due to shock/inflammation.   Plts 76 today x 2 days.  Doubt HIT.  - Continue  [place] heparin .  6. Mobilize with PT/OT.  7. GOC- DNR. Palliative Care following.   Length of Stay: 4  Amy Clegg, NP  09/03/2023, 9:07 AM  Advanced Heart Failure Team Pager [phone] (M-F; 7a - 5p)  Please contact [facility] for  night-coverage after hours (5p -7a ) and weekends on [email or web address]   Patient seen with NP, agree with the above note.   Weight down 4 lbs with IV Lasix  yesterday.  CVP 14-15, co-ox 66% on dobutamine  2.5.  Creatinine trending down 4.16 => 3.48 and LFTs trending down.  Plts stable at 76K.   Was able to walk this morning, fatigued.   General: NAD Neck: JVP 14+, no thyromegaly or thyroid nodule.  Lungs: Clear to auscultation bilaterally with normal respiratory effort. CV: Nondisplaced PMI.  Heart regular S1/S2, no S3/S4, no murmur.  No peripheral edema.   Abdomen: Soft, nontender, no hepatosplenomegaly, no distention.  Skin: Intact without lesions or rashes.  Neurologic: Alert and oriented x 3.  Psych: Normal affect. Extremities: No clubbing or cyanosis.  HEENT: Normal.   Slow improvement.  Continue dobutamine  2.5 today and will continue current Lasix  dosing.  Will add metolazone  2.5 today and replace K.    AKI and liver failure due to shock, now renal function and LFTs improving.   Not candidate for advanced therapies.   Walk in hall.   CRITICAL CARE Performed by: Peder Bourdon  Total critical care time: 35 minutes  Critical care time was exclusive of separately billable procedures and treating other patients.  Critical care was necessary to treat or prevent imminent or life-threatening deterioration.  Critical care was time spent personally by me on the following activities: development of treatment plan with patient and/or surrogate as well as nursing, discussions with consultants, evaluation of patient's response to treatment, examination of patient, obtaining history from patient or surrogate, ordering and performing treatments and interventions, ordering and review of laboratory studies, ordering and review of radiographic studies, pulse oximetry and re-evaluation of patient's condition.   Peder Bourdon 09/03/2023 11:18 AM

## 2023-09-03 NOTE — Plan of Care (Signed)

## 2023-09-03 NOTE — Progress Notes (Signed)
 Family at bedside. Concerned about pts thirst. Pt given multiple cups of water  throughout shift. Educated family about fluid intake vs output. Talked about how pt has had significantly more to drink than he has been able to urinate out. Talked about goals of care and what family would like concerning fluid intake. Family would like to just give patient as much to drink as he would like knowing that this is potentially going to make it more difficult on his heart.

## 2023-09-03 NOTE — Plan of Care (Signed)
  Problem: Education: Goal: Knowledge of General Education information will improve Description: Including pain rating scale, medication(s)/side effects and non-pharmacologic comfort measures Outcome: Progressing   Problem: Health Behavior/Discharge Planning: Goal: Ability to manage health-related needs will improve Outcome: Progressing   Problem: Clinical Measurements: Goal: Ability to maintain clinical measurements within normal limits will improve Outcome: Progressing Goal: Will remain free from infection Outcome: Progressing Goal: Diagnostic test results will improve Outcome: Progressing Goal: Respiratory complications will improve Outcome: Progressing Goal: Cardiovascular complication will be avoided Outcome: Progressing   Problem: Activity: Goal: Risk for activity intolerance will decrease Outcome: Progressing   Problem: Nutrition: Goal: Adequate nutrition will be maintained Outcome: Not Progressing   Problem: Nutrition: Goal: Adequate nutrition will be maintained Outcome: Not Progressing   Problem: Coping: Goal: Level of anxiety will decrease Outcome: Progressing   Problem: Elimination: Goal: Will not experience complications related to bowel motility Outcome: Not Progressing Goal: Will not experience complications related to urinary retention Outcome: Progressing   Problem: Pain Management: Goal: General experience of comfort will improve Outcome: Progressing   Problem: Safety: Goal: Ability to remain free from injury will improve Outcome: Progressing   Problem: Safety: Goal: Ability to remain free from injury will improve Outcome: Progressing   Problem: Skin Integrity: Goal: Risk for impaired skin integrity will decrease Outcome: Progressing

## 2023-09-04 DIAGNOSIS — Z7189 Other specified counseling: Secondary | ICD-10-CM | POA: Diagnosis not present

## 2023-09-04 DIAGNOSIS — Z66 Do not resuscitate: Secondary | ICD-10-CM | POA: Diagnosis not present

## 2023-09-04 DIAGNOSIS — I214 Non-ST elevation (NSTEMI) myocardial infarction: Secondary | ICD-10-CM | POA: Diagnosis not present

## 2023-09-04 DIAGNOSIS — I5023 Acute on chronic systolic (congestive) heart failure: Secondary | ICD-10-CM | POA: Diagnosis not present

## 2023-09-04 DIAGNOSIS — R570 Cardiogenic shock: Secondary | ICD-10-CM | POA: Diagnosis not present

## 2023-09-04 LAB — BASIC METABOLIC PANEL
Anion gap: 11 (ref 5–15)
BUN: 83 mg/dL — ABNORMAL HIGH (ref 8–23)
CO2: 30 mmol/L (ref 22–32)
Calcium: 8.5 mg/dL — ABNORMAL LOW (ref 8.9–10.3)
Chloride: 93 mmol/L — ABNORMAL LOW (ref 98–111)
Creatinine, Ser: 2.93 mg/dL — ABNORMAL HIGH (ref 0.61–1.24)
GFR, Estimated: 21 mL/min — ABNORMAL LOW (ref >60)
Glucose, Bld: 134 mg/dL — ABNORMAL HIGH (ref 70–99)
Potassium: 3.6 mmol/L (ref 3.5–5.1)
Sodium: 134 mmol/L — ABNORMAL LOW (ref 135–145)

## 2023-09-04 LAB — HEPATIC FUNCTION PANEL
ALT: 1664 U/L — ABNORMAL HIGH (ref 0–44)
AST: 286 U/L — ABNORMAL HIGH (ref 15–41)
Albumin: 2.9 g/dL — ABNORMAL LOW (ref 3.5–5.0)
Alkaline Phosphatase: 166 U/L — ABNORMAL HIGH (ref 38–126)
Bilirubin, Direct: 1.7 mg/dL — ABNORMAL HIGH (ref 0.0–0.2)
Indirect Bilirubin: 4.2 mg/dL — ABNORMAL HIGH (ref 0.3–0.9)
Total Bilirubin: 5.9 mg/dL — ABNORMAL HIGH (ref 0.0–1.2)
Total Protein: 5.1 g/dL — ABNORMAL LOW (ref 6.5–8.1)

## 2023-09-04 LAB — COOXEMETRY PANEL
Carboxyhemoglobin: 1.5 % (ref 0.5–1.5)
Methemoglobin: 0.8 % (ref 0.0–1.5)
O2 Saturation: 54 %
Total hemoglobin: 10.4 g/dL — ABNORMAL LOW (ref 12.0–16.0)

## 2023-09-04 LAB — CBC
HCT: 32.9 % — ABNORMAL LOW (ref 39.0–52.0)
Hemoglobin: 10.5 g/dL — ABNORMAL LOW (ref 13.0–17.0)
MCH: 27.7 pg (ref 26.0–34.0)
MCHC: 31.9 g/dL (ref 30.0–36.0)
MCV: 86.8 fL (ref 80.0–100.0)
Platelets: 88 10*3/uL — ABNORMAL LOW (ref 150–400)
RBC: 3.79 MIL/uL — ABNORMAL LOW (ref 4.22–5.81)
RDW: 17.5 % — ABNORMAL HIGH (ref 11.5–15.5)
WBC: 8.9 10*3/uL (ref 4.0–10.5)
nRBC: 1.5 % — ABNORMAL HIGH (ref 0.0–0.2)

## 2023-09-04 LAB — PROTIME-INR
INR: 1.4 — ABNORMAL HIGH (ref 0.8–1.2)
Prothrombin Time: 17.6 s — ABNORMAL HIGH (ref 11.4–15.2)

## 2023-09-04 LAB — HEPARIN INDUCED PLATELET AB (HIT ANTIBODY): Heparin Induced Plt Ab: 0.059 {OD_unit} (ref 0.000–0.400)

## 2023-09-04 MED ORDER — ORAL CARE MOUTH RINSE: 15.0000 mL | OROMUCOSAL | Status: DC | PRN

## 2023-09-04 MED ORDER — ROSUVASTATIN CALCIUM 5 MG PO TABS
5.0000 mg | ORAL_TABLET | Freq: Every day | ORAL | Status: DC
  Administered 2023-09-04 – 2023-09-05 (×2): 5 mg via ORAL
  Filled 2023-09-04 (×2): qty 1

## 2023-09-04 MED ORDER — GERHARDT'S BUTT CREAM
TOPICAL_CREAM | Freq: Two times a day (BID) | CUTANEOUS | Status: DC
  Filled 2023-09-04: qty 60

## 2023-09-04 MED ORDER — POTASSIUM CHLORIDE CRYS ER 20 MEQ PO TBCR
40.0000 meq | EXTENDED_RELEASE_TABLET | ORAL | Status: AC
Start: 2023-09-04 — End: 2023-09-04
  Administered 2023-09-04 (×2): 40 meq via ORAL
  Filled 2023-09-04 (×2): qty 2

## 2023-09-04 NOTE — Progress Notes (Signed)
[facility] NEPHROLOGY PROGRESS NOTE  Assessment/ Plan: Pt is a 83 y.o. yo male with medical history significant for CAD, systolic CHF status post biventricular ICD, HLD, HTN, moderate MR admitted with shortness of breath and CHF exacerbation.  # AKI on CKD 3b - b/l creatinine 1.8- 2.2.  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 CT showing normal kidneys w/o obstruction. UA negative. CXR clear.   Patient was moved to ICU for inotrope/dobutamine treatment.   Responding very well with inotrope/dobutamine with increased urine output and improving serum creatinine level. Expect gradual renal recovery.  No urgent need for dialysis however, I'm worried that he may not tolerate dialysis long-term, in that case I will recommend palliative hospice care.  I have discussed that with the patient's son and daughter-in-law on 1/15 and they were all in agreement.  Palliative care team is following.  # Hyperkalemia : Improved with medical management.  Follow lab.    # Transaminitis likely due to shock liver.  Enzymes are trending down.  Seen by GI.  #HFrEF - last echo dec 2024 w/ EF of 20-25%.  Currently on inotropes, not a candidate for advanced therapies for heart failure team. #H/o severe pHTN #Trop - started on IV heparin, Cards following  Subjective: Seen and examined at bedside.  Urine output is recorded around 2.6 L.  He is able to lie flat and denies chest pain, shortness of breath, nausea or vomiting.  No new event.  Objective Vital signs in last 24 hours: Vitals:   09/04/23 0404 09/04/23 0500 09/04/23 0600 09/04/23 0700  BP:  (!) 106/56 123/63 (!) 85/59  Pulse:  88 90 95  Resp:  17 17 18   Temp: 99.6 F (37.6 C)   98.5 F (36.9 C)  TempSrc: Oral   Oral  SpO2:  96% 96% 96%  Weight:  80.1 kg    Height:       Weight change: -0.5 kg  Intake/Output Summary (Last 24 hours) at 09/04/2023 0907 Last data filed at  09/04/2023 0800 Gross per 24 hour  Intake 2269.52 ml  Output 3400 ml  Net -1130.48 ml       Labs: RENAL PANEL Recent Labs  Lab 08/31/23 0222 08/31/23 1029 09/01/23 0250 09/01/23 0939 09/02/23 0519 09/03/23 0443 09/04/23 0358  NA 134*  --  133*  --  135 138 134*  K 5.3*  --  4.3  --  3.3* 3.6 3.6  CL 95*  --  88*  --  91* 95* 93*  CO2 11*  --  18*  --  25 25 30   GLUCOSE 73  --  96  --  121* 138* 134*  BUN 94*  --  106*  --  109* 96* 83*  CREATININE 4.48*  --  4.73*  --  4.16* 3.48* 2.93*  CALCIUM 7.8*  --  7.2*  --  7.5* 8.1* 8.5*  MG  --   --   --   --   --  2.4  --   ALBUMIN  --  3.1*  --  3.2* 3.0* 2.9* 2.9*    Liver Function Tests: Recent Labs  Lab 09/02/23 0519 09/03/23 0443 09/04/23 0358  AST 1,868* 712* 286*  ALT 2,815* 2,109* 1,664*  ALKPHOS 183* 157* 166*  BILITOT 6.0* 5.2* 5.9*  PROT 5.0* 4.9* 5.1*  ALBUMIN 3.0* 2.9* 2.9*   Recent Labs  Lab 08/30/23 1144  LIPASE 53*  Recent Labs  Lab 08/30/23 1918  AMMONIA 29   CBC: Recent Labs    08/31/23 0522 09/01/23 0250 09/02/23 0519 09/03/23 0443 09/04/23 0358  HGB 10.8* 10.8* 10.3* 10.2* 10.5*  MCV 90.7 88.3 86.6 87.7 86.8  FERRITIN 119  --   --   --   --   TIBC 448  --   --   --   --   IRON 56  --   --   --   --     Cardiac Enzymes: Recent Labs  Lab 08/31/23 0522  CKTOTAL 506*   CBG: No results for input(s): "GLUCAP" in the last 168 hours.  Iron Studies:  No results for input(s): "IRON", "TIBC", "TRANSFERRIN", "FERRITIN" in the last 72 hours.  Studies/Results: No results found.   Medications: Infusions:  DOBUTamine 2.5 mcg/kg/min (09/04/23 0700)   phytonadione (VITAMIN K) 10 mg in dextrose 5 % 50 mL IVPB Stopped (09/03/23 1351)    Scheduled Medications:  aspirin EC  81 mg Oral Daily   Chlorhexidine Gluconate Cloth  6 each Topical Daily   feeding supplement (NEPRO CARB STEADY)  237 mL Oral BID BM   furosemide  80 mg Intravenous Q8H   heparin injection (subcutaneous)   5,000 Units Subcutaneous Q8H   pantoprazole  40 mg Oral Daily   sodium chloride flush  10-40 mL Intracatheter Q12H   tamsulosin  0.4 mg Oral Daily    have reviewed scheduled and prn medications.  Physical Exam: General:NAD, able to lie comfortable. Heart:RRR, s1s2 nl Lungs: Clear bilateral, no increased work of breathing. Abdomen:soft, Non-tender, non-distended Extremities:No edema Neurology: Alert awake and following commands  Raychel Dowler Jaynie Collins 09/04/2023,9:07 AM  LOS: 5 days

## 2023-09-04 NOTE — Progress Notes (Signed)
Physical Therapy Treatment Patient Details Name: Tommy Ward MRN: [id number] DOB: 1940/12/09 Today's Date: 09/04/2023   History of Present Illness 83 y.o. 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  Working diagnosis of heart failure decompensation complicated with NSTEMI and multiorgan failure.    PT Comments  Pt continues with delayed response time, generalized deconditioning, impaired balance, use of Eva walker for safe ambulation, and overall decreased activity tolerance. Pt did improve ambulation distance this date compared to last session however required frequent standing rest breaks, verbal cues to stand up right and to increase step height due to onset of shuffling gait pattern with onset of fatigue. Unsure of medical plans, aware palliative is also working with patient. Recommend inpatient rehab program < 3 hrs/day at this time to allow pt increased time to achieve safe level of function to transition home with spouse. Acute PT to continue to follow.    If plan is discharge home, recommend the following: A lot of help with walking and/or transfers;Assistance with cooking/housework;Assist for transportation;Supervision due to cognitive status;Help with stairs or ramp for entrance   Can travel by private vehicle        Equipment Recommendations  Other (comment) (defer to post acute)    Recommendations for Other Services       Precautions / Restrictions Precautions Precautions: Fall Precaution Comments: fluid restriction, incontinent of urine Restrictions Weight Bearing Restrictions Per Provider Order: No     Mobility  Bed Mobility Overal bed mobility: Needs Assistance Bed Mobility: Supine to Sit     Supine to sit: Mod assist, HOB elevated, Used rails     General bed mobility comments: with max verbal cues  pt able to initiate LE movement to EOB, minA for log roll to the L, minA for trunk elevation    Transfers Overall transfer level: Needs assistance Equipment used:  (EVA walker) Transfers: Sit to/from Stand, Bed to chair/wheelchair/BSC Sit to Stand: Mod assist           General transfer comment: Mod assist to power up and Min assist once in standing with cues for safety, sequencing, and hand placement/technique, took 2 attempts    Ambulation/Gait Ambulation/Gait assistance: Mod assist, +2 safety/equipment Gait Distance (Feet): 295 Feet Assistive device: Fara Boros Gait Pattern/deviations: Step-through pattern, Decreased stride length, Trunk flexed Gait velocity: slow Gait velocity interpretation: <1.31 ft/sec, indicative of household ambulator   General Gait Details: pt with shoes on this date, with onset of fatigue pt beginning to shuffle feet requiring verbal cues to pick up feet, verbal cues to stand upright   Stairs             Wheelchair Mobility     Tilt Bed    Modified Rankin (Stroke Patients Only)       Balance Overall balance assessment: Needs assistance Sitting-balance support: Single extremity supported, No upper extremity supported, Feet supported Sitting balance-Leahy Scale: Fair   Postural control: Posterior lean Standing balance support: Bilateral upper extremity supported, Single extremity supported, During functional activity, Reliant on assistive device for balance Standing balance-Leahy Scale: Poor Standing balance comment: pt requires external support                            Cognition Arousal: Alert Behavior During Therapy: Flat affect Overall Cognitive Status: Impaired/Different  from baseline Area of Impairment: Safety/judgement, Problem solving, Following commands, Awareness, Attention                 Orientation Level: Person Current Attention Level: Sustained   Following Commands: Follows one step commands  with increased time Safety/Judgement: Decreased awareness of safety, Decreased awareness of deficits Awareness: Intellectual Problem Solving: Slow processing, Decreased initiation, Requires verbal cues General Comments: hyperfocused on being thirsty and wanting water, delayed response time        Exercises Other Exercises Other Exercises: gave dtr seated LE HEP: LAQ, heel/toe raises, and marching, dtr with verbal understanding    General Comments General comments (skin integrity, edema, etc.): VSS      Pertinent Vitals/Pain      Home Living                          Prior Function            PT Goals (current goals can now be found in the care plan section) Acute Rehab PT Goals Patient Stated Goal: pt wants to go home PT Goal Formulation: With patient Time For Goal Achievement: 09/14/23 Potential to Achieve Goals: Fair Progress towards PT goals: Progressing toward goals    Frequency    Min 1X/week      PT Plan      Co-evaluation              AM-PAC PT "6 Clicks" Mobility   Outcome Measure  Help needed turning from your back to your side while in a flat bed without using bedrails?: A Little Help needed moving from lying on your back to sitting on the side of a flat bed without using bedrails?: A Lot Help needed moving to and from a bed to a chair (including a wheelchair)?: A Lot Help needed standing up from a chair using your arms (e.g., wheelchair or bedside chair)?: A Lot Help needed to walk in hospital room?: A Lot Help needed climbing 3-5 steps with a railing? : A Lot 6 Click Score: 13    End of Session Equipment Utilized During Treatment: Gait belt Activity Tolerance: Patient tolerated treatment well Patient left: with call bell/phone within reach;in chair;with chair alarm set;with family/visitor present;with nursing/sitter in room Nurse Communication: Mobility status PT Visit Diagnosis: Unsteadiness on feet (R26.81);Other abnormalities  of gait and mobility (R26.89)     Time: 4034-7425 PT Time Calculation (min) (ACUTE ONLY): 27 min  Charges:    $Gait Training: 8-22 mins $Therapeutic Activity: 8-22 mins PT General Charges $$ ACUTE PT VISIT: 1 Visit                     Lewis Shock, PT, DPT Acute Rehabilitation Services Secure chat preferred Office #: [phone]    Iona Hansen 09/04/2023, 12:39 PM

## 2023-09-04 NOTE — Progress Notes (Signed)
Patient ID: Tommy Ward, male   DOB: 1941/01/02, 83 y.o.   MRN: [id number]     Advanced Heart Failure Rounding Note  Cardiologist: Chrystie Nose, MD  Chief Complaint: Heart Failure Subjective:    Yesterday diuresed with Lasix 80 mg IV q8 + metolazone 2.5 x 1.  Weight down 1 lb.  CVP 8-9.    Dobutamine 2.5 mcg/kg/min, co-0ox 54%.    Creatinine lower today at 4.7 => 4.16 => 3.5 => 2.93.   Plts 65 => 76 => 76 => 88  LFTs trending down.   Walked today in hall, feeling better.   Objective:   Weight Range: 80.1 kg Body mass index is 26.08 kg/m.   Vital Signs:   Temp:  [97.5 F (36.4 C)-99.9 F (37.7 C)] 98.5 F (36.9 C) (01/16 0700) Pulse Rate:  [79-99] 91 (01/16 0930) Resp:  [14-27] 23 (01/16 0930) BP: (85-142)/(44-110) 117/53 (01/16 0930) SpO2:  [90 %-98 %] 96 % (01/16 0930) Weight:  [80.1 kg] 80.1 kg (01/16 0500) Last BM Date : 09/01/23  Weight change: Filed Weights   09/02/23 0500 09/03/23 0500 09/04/23 0500  Weight: 82.4 kg 80.6 kg 80.1 kg    Intake/Output:   Intake/Output Summary (Last 24 hours) at 09/04/2023 1015 Last data filed at 09/04/2023 0900 Gross per 24 hour  Intake 2772.66 ml  Output 3400 ml  Net -627.34 ml    CVP 8-9  Physical Exam   General: NAD Neck: JVP 8-9 cm, no thyromegaly or thyroid nodule.  Lungs: Clear to auscultation bilaterally with normal respiratory effort. CV: Nondisplaced PMI.  Heart regular S1/S2, no S3/S4, no murmur.  No peripheral edema.   Abdomen: Soft, nontender, no hepatosplenomegaly, no distention.  Skin: Intact without lesions or rashes.  Neurologic: Alert and oriented x 3.  Psych: Normal affect. Extremities: No clubbing or cyanosis.  HEENT: Normal.   Telemetry   NSR with PVCs and BiV pacing (personally reviewed).   Labs    CBC Recent Labs    09/03/23 0443 09/04/23 0358  WBC 6.0 8.9  HGB 10.2* 10.5*  HCT 32.7* 32.9*  MCV 87.7 86.8  PLT 76* 88*   Basic Metabolic Panel Recent Labs     09/03/23 0443 09/04/23 0358  NA 138 134*  K 3.6 3.6  CL 95* 93*  CO2 25 30  GLUCOSE 138* 134*  BUN 96* 83*  CREATININE 3.48* 2.93*  CALCIUM 8.1* 8.5*  MG 2.4  --    Liver Function Tests Recent Labs    09/03/23 0443 09/04/23 0358  AST 712* 286*  ALT 2,109* 1,664*  ALKPHOS 157* 166*  BILITOT 5.2* 5.9*  PROT 4.9* 5.1*  ALBUMIN 2.9* 2.9*   No results for input(s): "LIPASE", "AMYLASE" in the last 72 hours.  Cardiac Enzymes No results for input(s): "CKTOTAL", "CKMB", "CKMBINDEX", "TROPONINI" in the last 72 hours.   BNP: BNP (last 3 results) Recent Labs    05/28/23 1444 08/04/23 1411 08/30/23 1144  BNP 1,737.8* 1,327.6* 3,686.6*    ProBNP (last 3 results) No results for input(s): "PROBNP" in the last 8760 hours.   D-Dimer No results for input(s): "DDIMER" in the last 72 hours. Hemoglobin A1C No results for input(s): "HGBA1C" in the last 72 hours. Fasting Lipid Panel No results for input(s): "CHOL", "HDL", "LDLCALC", "TRIG", "CHOLHDL", "LDLDIRECT" in the last 72 hours. Thyroid Function Tests No results for input(s): "TSH", "T4TOTAL", "T3FREE", "THYROIDAB" in the last 72 hours.  Invalid input(s): "FREET3"  Other results:   Imaging  No results found.    Medications:     Scheduled Medications:  aspirin EC  81 mg Oral Daily   Chlorhexidine Gluconate Cloth  6 each Topical Daily   feeding supplement (NEPRO CARB STEADY)  237 mL Oral BID BM   furosemide  80 mg Intravenous Q8H   heparin injection (subcutaneous)  5,000 Units Subcutaneous Q8H   pantoprazole  40 mg Oral Daily   sodium chloride flush  10-40 mL Intracatheter Q12H   tamsulosin  0.4 mg Oral Daily    Infusions:  DOBUTamine 2.5 mcg/kg/min (09/04/23 0900)   phytonadione (VITAMIN K) 10 mg in dextrose 5 % 50 mL IVPB 10 mg (09/04/23 0919)    PRN Medications: mouth rinse, sodium chloride flush    Assessment/Plan   1. Cardiogenic shock: Ischemic cardiomyopathy.  St Jude CRT-D device.   Admitted with dyspnea, fatigue.   EF has ranged from <20% to 20-25% in the past.  Echo this admission with EF < 20%, severe LV dilation, mild-moderate RV dysfunction, moderate MR, IVC dilated.  Patient was admitted with AKI, markedly elevated LFTs, lactate 8.2 (4.1 today).  HS-TnI 3487 but no significant trend.  Cardiogenic shock explains his presentation.  Patient also had not been effectively BiV pacing for at least a couple of months, this may have played a role in his decompensation => device was interrogated by rep and adjusted, he is now BiV pacing effectively. CVP has decreased with diuresis, now 8-9 today.  Weight coming down.  Creatinine down to 2.93. Co-ox 54% on dobutamine 2.5.  - Continue dobutamine 2.5 mcg/kg/min.  - Lasix 80 mg IV every 8 hrs today, no metolazone.  Probably to po tomorrow.   - At 83 years old and with renal failure, not a candidate for advanced therapies or mechanical support. 2. CAD: Occluded mid LAD and 80% proximal stenosis in nondominant RCA in 4/22. No intervention. HS-TnI elevated at 3487 but no trend.  No chest pain.  May be demand ischemia from profound cardiogenic shock (see LFTs, AKI), but regardless would not cath with no CP and AKI.  - Continue ASA - Restart statin today.  - Now on  [place] heparin for DVT prophylaxis.  3. AKI on CKD stage 3: Baseline creatinine 2, peaked at 4.7--> 3.48 --> 2.93.  -Continue Dobutamine.  - Not a good long-term HD candidate with end stage HF.  4. Elevated LFTs: Suspect shock liver.  Abdominal US without obvious cirrhosis, surgically absent gallbladder. LFTs now trending down.  5. Thrombocytopenia: Plts dropped immediately after admission.  Think this was due to shock/inflammation.  Platelets now slowly trending up.  Doubt HIT.  - Continue  [place] heparin.  6. Mobilize with PT/OT.  7. GOC- DNR. Palliative Care following.   CRITICAL CARE Performed by: Marca Ancona  Total critical care time: 35 minutes  Critical care time was  exclusive of separately billable procedures and treating other patients.  Critical care was necessary to treat or prevent imminent or life-threatening deterioration.  Critical care was time spent personally by me on the following activities: development of treatment plan with patient and/or surrogate as well as nursing, discussions with consultants, evaluation of patient's response to treatment, examination of patient, obtaining history from patient or surrogate, ordering and performing treatments and interventions, ordering and review of laboratory studies, ordering and review of radiographic studies, pulse oximetry and re-evaluation of patient's condition.   Marca Ancona 09/04/2023 10:15 AM

## 2023-09-04 NOTE — Progress Notes (Signed)
PROGRESS NOTE    Tommy Ward  UJW:[id number] DOB: Aug 08, 1941 DOA: 08/30/2023 PCP: Buckner Malta, MD  83/M with history of chronic systolic CHF, prostate cancer, CKD 4, presented to the ED with dyspnea on exertion, BNP 3686, troponin 3487, creatinine 4.1, AST 3591, ALT 2873, chest x-ray with cardiomegaly, abdominal CT with mild ascites, no hydronephrosis, admitted started on IV Lasix and heparin gtt. -1/13 echo noted EF<20% with signs of hypoperfusion, transferred to ICU, started on dobutamine, IV Lasix -Poor prognosis, CODE STATUS changed to DNR -1/14, palliative care consulted -Overall improving with inotropes, diuresis, AKI improving  Subjective: -Feels better overall, breathing is improving  Assessment and Plan:  Acute on chronic systolic heart failure Cardiogenic shock -Known ischemic cardiomyopathy -Echo this admission with a EF<20%, moderate RV dysfunction, moderate MVR -Admitted with AKI and cardiogenic shock -Heart failure team following, on IV Lasix, dobutamine, Co. ox down to 54 today -Not a candidate for advanced therapies, palliative consulting, DNR  CAD -Not a candidate for cath with significant AKI regardless -Continue aspirin, restarting statin  NSTEMI versus demand ischemia -Troponin peaked at 3847 -No chest pain, suspected to be demand related from cardiogenic shock -Cards following, no plan for ischemic eval in the setting of AKI  AKI, CKD 3b Baseline creatinine around 2, creatinine peaked at 4.7, now improving on dobutamine -Cardiorenal, creatinine down to 2.9  Shock liver -LFTs improving, ultrasound without cirrhosis  Acute thrombocytopenia -Likely reactive in the setting of shock, also ordered HIT antibody panel for completeness -Was on bivalirudin for few days, switched back to heparin 1/14 -Now improving  Malignant neoplasm of prostate (HCC) Follow up as outpatient.   DVT prophylaxis: Heparin subcutaneous Code Status: DNR Family  Communication: No family at bedside Disposition Plan: Transfer out of ICU  Consultants:    Procedures:   Antimicrobials:    Objective: Vitals:   09/04/23 0911 09/04/23 0930 09/04/23 1000 09/04/23 1100  BP: (!) 107/56 (!) 117/53 (!) 118/56 115/64  Pulse:  91 84 73  Resp:  (!) 23 16 15   Temp:    (!) 97 F (36.1 C)  TempSrc:    Axillary  SpO2:  96% 96% 97%  Weight:      Height:        Intake/Output Summary (Last 24 hours) at 09/04/2023 1155 Last data filed at 09/04/2023 1100 Gross per 24 hour  Intake 2946.86 ml  Output 3400 ml  Net -453.14 ml   Filed Weights   09/02/23 0500 09/03/23 0500 09/04/23 0500  Weight: 82.4 kg 80.6 kg 80.1 kg    Examination:  General exam: Elderly chronically ill male sitting up in bed, AAOx3 HEENT: Positive JVD CVS: S1-S2, regular rhythm Lungs: Decreased breath sounds at the bases otherwise clear Abdomen: Soft, nontender, bowel sounds present Extremities: No edema Skin: No rashes Psychiatry:  Mood & affect appropriate.     Data Reviewed:   CBC: Recent Labs  Lab 08/30/23 1144 08/31/23 0222 08/31/23 0522 09/01/23 0250 09/02/23 0519 09/03/23 0443 09/04/23 0358  WBC 11.5*   < > 10.5 10.0 7.0 6.0 8.9  NEUTROABS 9.1*  --   --   --   --   --   --   HGB 11.5*   < > 10.8* 10.8* 10.3* 10.2* 10.5*  HCT 37.7*   < > 35.0* 34.0* 31.6* 32.7* 32.9*  MCV 92.6   < > 90.7 88.3 86.6 87.7 86.8  PLT 166   < > 85* 65* 76* 76* 88*   < > = values  in this interval not displayed.   Basic Metabolic Panel: Recent Labs  Lab 08/31/23 0222 09/01/23 0250 09/02/23 0519 09/03/23 0443 09/04/23 0358  NA 134* 133* 135 138 134*  K 5.3* 4.3 3.3* 3.6 3.6  CL 95* 88* 91* 95* 93*  CO2 11* 18* 25 25 30   GLUCOSE 73 96 121* 138* 134*  BUN 94* 106* 109* 96* 83*  CREATININE 4.48* 4.73* 4.16* 3.48* 2.93*  CALCIUM 7.8* 7.2* 7.5* 8.1* 8.5*  MG  --   --   --  2.4  --    GFR: Estimated Creatinine Clearance: 19.8 mL/min (A) (by C-G formula based on SCr of 2.93  mg/dL (H)). Liver Function Tests: Recent Labs  Lab 08/31/23 1029 09/01/23 0939 09/02/23 0519 09/03/23 0443 09/04/23 0358  AST 4,105* 3,190* 1,868* 712* 286*  ALT 3,390* 3,386* 2,815* 2,109* 1,664*  ALKPHOS 160* 183* 183* 157* 166*  BILITOT 6.3* 7.2* 6.0* 5.2* 5.9*  PROT 5.2* 5.4* 5.0* 4.9* 5.1*  ALBUMIN 3.1* 3.2* 3.0* 2.9* 2.9*   Recent Labs  Lab 08/30/23 1144  LIPASE 53*   Recent Labs  Lab 08/30/23 1918  AMMONIA 29   Coagulation Profile: Recent Labs  Lab 08/30/23 1605 08/31/23 1029 09/02/23 0519 09/03/23 0443 09/04/23 0358  INR 2.4* 3.4* 3.8* 1.7* 1.4*   Cardiac Enzymes: Recent Labs  Lab 08/31/23 0522  CKTOTAL 506*   BNP (last 3 results) No results for input(s): "PROBNP" in the last 8760 hours. HbA1C: No results for input(s): "HGBA1C" in the last 72 hours. CBG: No results for input(s): "GLUCAP" in the last 168 hours. Lipid Profile: No results for input(s): "CHOL", "HDL", "LDLCALC", "TRIG", "CHOLHDL", "LDLDIRECT" in the last 72 hours. Thyroid Function Tests: No results for input(s): "TSH", "T4TOTAL", "FREET4", "T3FREE", "THYROIDAB" in the last 72 hours. Anemia Panel: No results for input(s): "VITAMINB12", "FOLATE", "FERRITIN", "TIBC", "IRON", "RETICCTPCT" in the last 72 hours. Urine analysis:    Component Value Date/Time   COLORURINE AMBER (A) 08/31/2023 0345   APPEARANCEUR HAZY (A) 08/31/2023 0345   LABSPEC 1.012 08/31/2023 0345   PHURINE 5.0 08/31/2023 0345   GLUCOSEU NEGATIVE 08/31/2023 0345   HGBUR NEGATIVE 08/31/2023 0345   BILIRUBINUR NEGATIVE 08/31/2023 0345   KETONESUR NEGATIVE 08/31/2023 0345   PROTEINUR 30 (A) 08/31/2023 0345   NITRITE NEGATIVE 08/31/2023 0345   LEUKOCYTESUR NEGATIVE 08/31/2023 0345   Sepsis Labs: @LABRCNTIP (procalcitonin:4,lacticidven:4)  ) Recent Results (from the past 240 hours)  Resp panel by RT-PCR (RSV, Flu A&B, Covid) Anterior Nasal Swab     Status: None   Collection Time: 08/30/23 11:44 AM   Specimen:  Anterior Nasal Swab  Result Value Ref Range Status   SARS Coronavirus 2 by RT PCR NEGATIVE NEGATIVE Final   Influenza A by PCR NEGATIVE NEGATIVE Final   Influenza B by PCR NEGATIVE NEGATIVE Final    Comment: (NOTE) The Xpert Xpress SARS-CoV-2/FLU/RSV plus assay is intended as an aid in the diagnosis of influenza from Nasopharyngeal swab specimens and should not be used as a sole basis for treatment. Nasal washings and aspirates are unacceptable for Xpert Xpress SARS-CoV-2/FLU/RSV testing.  Fact Sheet for Patients: BloggerCourse.com  Fact Sheet for Healthcare Providers: SeriousBroker.it  This test is not yet approved or cleared by the [place] FDA and has been authorized for detection and/or diagnosis of SARS-CoV-2 by FDA under an Emergency Use Authorization (EUA). This EUA will remain in effect (meaning this test can be used) for the duration of the COVID-19 declaration under Section 564(b)(1) of the Act, 21 U.S.C.  section 360bbb-3(b)(1), unless the authorization is terminated or revoked.     Resp Syncytial Virus by PCR NEGATIVE NEGATIVE Final    Comment: (NOTE) Fact Sheet for Patients: BloggerCourse.com  Fact Sheet for Healthcare Providers: SeriousBroker.it  This test is not yet approved or cleared by the [place] FDA and has been authorized for detection and/or diagnosis of SARS-CoV-2 by FDA under an Emergency Use Authorization (EUA). This EUA will remain in effect (meaning this test can be used) for the duration of the COVID-19 declaration under Section 564(b)(1) of the Act, 21 U.S.C. section 360bbb-3(b)(1), unless the authorization is terminated or revoked.  Performed at [facility], [address], [place], [place] [address]   MRSA Next Gen by PCR, Nasal     Status: None   Collection Time: 09/01/23  6:34 PM   Specimen: Nasal Mucosa; Nasal Swab   Result Value Ref Range Status   MRSA by PCR Next Gen NOT DETECTED NOT DETECTED Final    Comment: (NOTE) The GeneXpert MRSA Assay (FDA approved for NASAL specimens only), is one component of a comprehensive MRSA colonization surveillance program. It is not intended to diagnose MRSA infection nor to guide or monitor treatment for MRSA infections. Test performance is not FDA approved in patients less than 12 years old. Performed at [facility], [address] [place], [place] [address]      Radiology Studies: No results found.   Scheduled Meds:  aspirin EC  81 mg Oral Daily   Chlorhexidine Gluconate Cloth  6 each Topical Daily   feeding supplement (NEPRO CARB STEADY)  237 mL Oral BID BM   furosemide  80 mg Intravenous Q8H   heparin injection (subcutaneous)  5,000 Units Subcutaneous Q8H   pantoprazole  40 mg Oral Daily   potassium chloride  40 mEq Oral Q4H   rosuvastatin  5 mg Oral Daily   sodium chloride flush  10-40 mL Intracatheter Q12H   tamsulosin  0.4 mg Oral Daily   Continuous Infusions:  DOBUTamine 2.5 mcg/kg/min (09/04/23 1100)     LOS: 5 days    Time spent:    Zannie Cove, MD [facility]   09/04/2023, 11:55 AM

## 2023-09-04 NOTE — Plan of Care (Signed)
  Problem: Clinical Measurements: Goal: Ability to maintain clinical measurements within normal limits will improve Outcome: Progressing   

## 2023-09-05 DIAGNOSIS — Z7189 Other specified counseling: Secondary | ICD-10-CM | POA: Diagnosis not present

## 2023-09-05 DIAGNOSIS — I5023 Acute on chronic systolic (congestive) heart failure: Secondary | ICD-10-CM | POA: Diagnosis not present

## 2023-09-05 DIAGNOSIS — I214 Non-ST elevation (NSTEMI) myocardial infarction: Secondary | ICD-10-CM | POA: Diagnosis not present

## 2023-09-05 DIAGNOSIS — Z66 Do not resuscitate: Secondary | ICD-10-CM | POA: Diagnosis not present

## 2023-09-05 LAB — CBC
HCT: 32.4 % — ABNORMAL LOW (ref 39.0–52.0)
Hemoglobin: 10.4 g/dL — ABNORMAL LOW (ref 13.0–17.0)
MCH: 27.4 pg (ref 26.0–34.0)
MCHC: 32.1 g/dL (ref 30.0–36.0)
MCV: 85.3 fL (ref 80.0–100.0)
Platelets: 104 10*3/uL — ABNORMAL LOW (ref 150–400)
RBC: 3.8 MIL/uL — ABNORMAL LOW (ref 4.22–5.81)
RDW: 18 % — ABNORMAL HIGH (ref 11.5–15.5)
WBC: 7.9 10*3/uL (ref 4.0–10.5)
nRBC: 1.4 % — ABNORMAL HIGH (ref 0.0–0.2)

## 2023-09-05 LAB — HEPATIC FUNCTION PANEL
ALT: 1141 U/L — ABNORMAL HIGH (ref 0–44)
AST: 134 U/L — ABNORMAL HIGH (ref 15–41)
Albumin: 3 g/dL — ABNORMAL LOW (ref 3.5–5.0)
Alkaline Phosphatase: 153 U/L — ABNORMAL HIGH (ref 38–126)
Bilirubin, Direct: 2.2 mg/dL — ABNORMAL HIGH (ref 0.0–0.2)
Indirect Bilirubin: 4 mg/dL — ABNORMAL HIGH (ref 0.3–0.9)
Total Bilirubin: 6.2 mg/dL — ABNORMAL HIGH (ref 0.0–1.2)
Total Protein: 5.5 g/dL — ABNORMAL LOW (ref 6.5–8.1)

## 2023-09-05 LAB — BASIC METABOLIC PANEL
Anion gap: 15 (ref 5–15)
BUN: 76 mg/dL — ABNORMAL HIGH (ref 8–23)
CO2: 28 mmol/L (ref 22–32)
Calcium: 8.7 mg/dL — ABNORMAL LOW (ref 8.9–10.3)
Chloride: 90 mmol/L — ABNORMAL LOW (ref 98–111)
Creatinine, Ser: 3.02 mg/dL — ABNORMAL HIGH (ref 0.61–1.24)
GFR, Estimated: 20 mL/min — ABNORMAL LOW (ref >60)
Glucose, Bld: 121 mg/dL — ABNORMAL HIGH (ref 70–99)
Potassium: 3.9 mmol/L (ref 3.5–5.1)
Sodium: 133 mmol/L — ABNORMAL LOW (ref 135–145)

## 2023-09-05 LAB — COOXEMETRY PANEL
Carboxyhemoglobin: 1.6 % — ABNORMAL HIGH (ref 0.5–1.5)
Methemoglobin: <0.7 % (ref 0.0–1.5)
O2 Saturation: 38.3 %
Total hemoglobin: 11.5 g/dL — ABNORMAL LOW (ref 12.0–16.0)

## 2023-09-05 LAB — MAGNESIUM: Magnesium: 2.2 mg/dL (ref 1.7–2.4)

## 2023-09-05 LAB — PROTIME-INR
INR: 1.2 (ref 0.8–1.2)
Prothrombin Time: 15.8 s — ABNORMAL HIGH (ref 11.4–15.2)

## 2023-09-05 MED ORDER — SODIUM CHLORIDE 0.9% FLUSH: 3.0000 mL | Freq: Two times a day (BID) | INTRAVENOUS | Status: DC

## 2023-09-05 MED ORDER — HYDROMORPHONE HCL 1 MG/ML IJ SOLN: 3.0000 mg | Freq: Once | INTRAMUSCULAR | Status: DC

## 2023-09-05 MED ORDER — METOLAZONE 5 MG PO TABS
5.0000 mg | ORAL_TABLET | Freq: Once | ORAL | Status: AC
  Administered 2023-09-05: 5 mg via ORAL
  Filled 2023-09-05: qty 1

## 2023-09-05 MED ORDER — HYDROMORPHONE HCL-NACL 50-0.9 MG/50ML-% IV SOLN
3.0000 mg/h | INTRAVENOUS | Status: DC
  Administered 2023-09-05: 2 mg/h via INTRAVENOUS
  Filled 2023-09-05: qty 50

## 2023-09-05 MED ORDER — POTASSIUM CHLORIDE CRYS ER 20 MEQ PO TBCR
40.0000 meq | EXTENDED_RELEASE_TABLET | Freq: Once | ORAL | Status: AC
  Administered 2023-09-05: 40 meq via ORAL
  Filled 2023-09-05: qty 2

## 2023-09-05 MED ORDER — HEPARIN (PORCINE) 25000 UT/250ML-% IV SOLN
1000.0000 [IU]/h | INTRAVENOUS | Status: DC
  Administered 2023-09-05: 1000 [IU]/h via INTRAVENOUS
  Filled 2023-09-05: qty 250

## 2023-09-05 MED ORDER — HYDROMORPHONE BOLUS VIA INFUSION: 1.0000 mg | INTRAVENOUS | Status: DC | PRN

## 2023-09-05 MED ORDER — AMIODARONE HCL IN DEXTROSE 360-4.14 MG/200ML-% IV SOLN: 30.0000 mg/h | INTRAVENOUS | Status: DC

## 2023-09-05 MED ORDER — GLYCOPYRROLATE 0.2 MG/ML IJ SOLN
0.2000 mg | INTRAMUSCULAR | Status: DC | PRN
  Filled 2023-09-05: qty 1

## 2023-09-05 MED ORDER — HYDROMORPHONE BOLUS VIA INFUSION
3.0000 mg | Freq: Once | INTRAVENOUS | Status: AC
Start: 2023-09-05 — End: 2023-09-05
  Administered 2023-09-05: 3 mg via INTRAVENOUS
  Filled 2023-09-05: qty 3

## 2023-09-05 MED ORDER — FUROSEMIDE 10 MG/ML IJ SOLN
160.0000 mg | Freq: Two times a day (BID) | INTRAVENOUS | Status: DC
  Administered 2023-09-05: 160 mg via INTRAVENOUS
  Filled 2023-09-05: qty 16
  Filled 2023-09-05: qty 10

## 2023-09-05 MED ORDER — AMIODARONE HCL IN DEXTROSE 360-4.14 MG/200ML-% IV SOLN
60.0000 mg/h | INTRAVENOUS | Status: DC
  Administered 2023-09-05: 59.94 mg/h via INTRAVENOUS
  Filled 2023-09-05: qty 200

## 2023-09-05 MED ORDER — GLYCOPYRROLATE 0.2 MG/ML IJ SOLN: 0.2000 mg | INTRAMUSCULAR | Status: DC | PRN

## 2023-09-05 MED ORDER — POLYVINYL ALCOHOL 1.4 % OP SOLN: 1.0000 [drp] | Freq: Four times a day (QID) | OPHTHALMIC | Status: DC | PRN

## 2023-09-05 MED ORDER — SODIUM CHLORIDE 0.9% FLUSH
3.0000 mL | INTRAVENOUS | Status: DC | PRN
Start: 2023-09-05 — End: 2023-09-05

## 2023-09-05 MED ORDER — AMIODARONE LOAD VIA INFUSION
150.0000 mg | Freq: Once | INTRAVENOUS | Status: AC
Start: 2023-09-05 — End: 2023-09-05
  Administered 2023-09-05: 150 mg via INTRAVENOUS
  Filled 2023-09-05: qty 83.34

## 2023-09-05 MED ORDER — HYDROMORPHONE BOLUS VIA INFUSION
3.0000 mg | Freq: Once | INTRAVENOUS | Status: AC
  Administered 2023-09-05: 3 mg via INTRAVENOUS
  Filled 2023-09-05: qty 3

## 2023-09-05 MED ORDER — GLYCOPYRROLATE 1 MG PO TABS
1.0000 mg | ORAL_TABLET | ORAL | Status: DC | PRN
Start: 2023-09-05 — End: 2023-09-05

## 2023-09-05 MED ORDER — MIDAZOLAM HCL 2 MG/2ML IJ SOLN
4.0000 mg | Freq: Once | INTRAMUSCULAR | Status: AC
  Administered 2023-09-05: 4 mg via INTRAVENOUS
  Filled 2023-09-05: qty 4

## 2023-09-20 NOTE — Progress Notes (Addendum)
Patient ID: Tommy Ward, male   DOB: 05-Aug-1941, 83 y.o.   MRN: [id number]     Advanced Heart Failure Rounding Note  Cardiologist: Chrystie Nose, MD  Chief Complaint: Heart Failure Subjective:    On DBA 2.5 mcg/kg/min Negative 2.7L UOP yesterday with IV lasix 80 tid + Metolazone 2.5 mg.  In Afib this am with CVP 18-19. Weight overall down. Coox pending.     sCr relatively stable 2.93>3.02  Feeling poor. Did not sleep well overnight. Wanting to get back into bed. No SOB or palpitations.   Objective:   Weight Range: 77.2 kg Body mass index is 25.13 kg/m.   Vital Signs:   Temp:  [96.9 F (36.1 C)-98.4 F (36.9 C)] 97.8 F (36.6 C) (01/17 0757) Pulse Rate:  [73-108] 108 (01/17 0829) Resp:  [15-23] 18 (01/17 0829) BP: (98-120)/(49-72) 102/68 (01/17 0829) SpO2:  [91 %-98 %] 94 % (01/17 0757) Weight:  [77.2 kg] 77.2 kg (01/17 0625) Last BM Date : 09/01/23  Weight change: Filed Weights   09/03/23 0500 09/04/23 0500  0625  Weight: 80.6 kg 80.1 kg 77.2 kg    Intake/Output:   Intake/Output Summary (Last 24 hours) at  0925 Last data filed at  0349 Gross per 24 hour  Intake 204.01 ml  Output 2650 ml  Net -2445.99 ml    Physical Exam   CVP 18-19 General: Puny, weak appearing. No distress on [place] HEENT: neck supple.   Cardiac: JVP ~14cm. S1 and S2 present. No murmurs or rub. Resp: Lung sounds clear and equal B/L Abdomen: Jaundice. Soft, non-tender, non-distended. + BS. Extremities: Warm and dry. No rash, cyanosis.  Trace peripherial edema.  Neuro: Drowsy. Inattentive. Follows commands with repeated request. Moves all extremities with difficulty. Lines/Devices: RUE  PICC  Telemetry   Afib 100-130s with intermittent paced beats (personally reviewed)  Labs    CBC Recent Labs    09/04/23 0358  0424  WBC 8.9 7.9  HGB 10.5* 10.4*  HCT 32.9* 32.4*  MCV 86.8 85.3  PLT 88* 104*   Basic Metabolic Panel Recent Labs     09/03/23 0443 09/04/23 0358  0424  NA 138 134* 133*  K 3.6 3.6 3.9  CL 95* 93* 90*  CO2 25 30 28   GLUCOSE 138* 134* 121*  BUN 96* 83* 76*  CREATININE 3.48* 2.93* 3.02*  CALCIUM 8.1* 8.5* 8.7*  MG 2.4  --   --    Liver Function Tests Recent Labs    09/04/23 0358  0424  AST 286* 134*  ALT 1,664* 1,141*  ALKPHOS 166* 153*  BILITOT 5.9* 6.2*  PROT 5.1* 5.5*  ALBUMIN 2.9* 3.0*   No results for input(s): "LIPASE", "AMYLASE" in the last 72 hours.  Cardiac Enzymes No results for input(s): "CKTOTAL", "CKMB", "CKMBINDEX", "TROPONINI" in the last 72 hours.   BNP: BNP (last 3 results) Recent Labs    05/28/23 1444 08/04/23 1411 08/30/23 1144  BNP 1,737.8* 1,327.6* 3,686.6*    ProBNP (last 3 results) No results for input(s): "PROBNP" in the last 8760 hours.   D-Dimer No results for input(s): "DDIMER" in the last 72 hours. Hemoglobin A1C No results for input(s): "HGBA1C" in the last 72 hours. Fasting Lipid Panel No results for input(s): "CHOL", "HDL", "LDLCALC", "TRIG", "CHOLHDL", "LDLDIRECT" in the last 72 hours. Thyroid Function Tests No results for input(s): "TSH", "T4TOTAL", "T3FREE", "THYROIDAB" in the last 72 hours.  Invalid input(s): "FREET3"  Other results:  Imaging   No results found.  Medications:  Scheduled Medications:  aspirin EC  81 mg Oral Daily   Chlorhexidine Gluconate Cloth  6 each Topical Daily   feeding supplement (NEPRO CARB STEADY)  237 mL Oral BID BM   furosemide  80 mg Intravenous Q8H   Gerhardt's butt cream   Topical BID   heparin injection (subcutaneous)  5,000 Units Subcutaneous Q8H   pantoprazole  40 mg Oral Daily   rosuvastatin  5 mg Oral Daily   sodium chloride flush  10-40 mL Intracatheter Q12H   tamsulosin  0.4 mg Oral Daily    Infusions:  DOBUTamine 2.5 mcg/kg/min (09/04/23 1600)    PRN Medications: mouth rinse, sodium chloride flush  Assessment/Plan   1. Cardiogenic shock: Ischemic  cardiomyopathy.  St Jude CRT-D device.  Admitted with dyspnea, fatigue.   EF has ranged from <20% to 20-25% in the past.  Echo this admission with EF < 20%, severe LV dilation, mild-moderate RV dysfunction, moderate MR, IVC dilated.  Patient was admitted with AKI, markedly elevated LFTs, lactate 8.2 (4.1 today).  HS-TnI 3487 but no significant trend.  Cardiogenic shock explains his presentation.  Patient also had not been effectively BiV pacing for at least a couple of months, this may have played a role in his decompensation => device was interrogated by rep and adjusted, he is now BiV pacing effectively. CVP severely elevated 18-19 with AF. Weight coming down.  Creatinine stable 2.9>3.0. Coox pending, although suspect low.  - Continue dobutamine 2.5 mcg/kg/min. - Increase Lasix to 160 mg bid + metolazone 5 mg.   - At 83 years old and with renal failure, not a candidate for advanced therapies or mechanical support. 2. Atrial Fibrillation: Noted to have increase HR overnight in the 100s with PACs. In AF on tele this morning around 8am. - Start heparin gtt - Start amio bolus + gtt 2. CAD: Occluded mid LAD and 80% proximal stenosis in nondominant RCA in 4/22. No intervention. HS-TnI elevated at 3487 but no trend.  No chest pain.  May be demand ischemia from profound cardiogenic shock (see LFTs, AKI), but regardless would not cath with no CP and AKI.  - Continue ASA + statin today.  3. AKI on CKD stage 3: Baseline creatinine 2, peaked at 4.7--> 3.48 --> 2.93.  - Continue Dobutamine.  - Not a good long-term HD candidate with end stage HF.  4. Elevated LFTs: Suspect shock liver.  Abdominal US without obvious cirrhosis, surgically absent gallbladder. LFTs now trending down. Ammonia pending, jaundice with AMS this am.  5. Thrombocytopenia: Plts dropped immediately after admission.  Think this was due to shock/inflammation.  Platelets now slowly trending up.  Doubt HIT.  - Continue  [place] heparin.  6. Mobilize  with PT/OT.  7. GOC- DNR. Palliative Care following.   [place] Micaila Ziemba, NP  9:25 AM   Agree with above.   SOB this am. Feels terrible. Family at bedside  CO-ox pending (drawn personally). In AF with RVR CVP 19.   Scr going back up. Bili remains 6  General:  Weak appearing. SOB at rest HEENT: normal Neck: supple. JVP to ear  Carotids 2+ bilat; no bruits. No lymphadenopathy or thryomegaly appreciated. Cor: Irregular rate & rhythm. No rubs, gallops or murmurs. Lungs: clear Abdomen: soft, nontender, + distended. No hepatosplenomegaly. No bruits or masses. Good bowel sounds. Extremities: no cyanosis, clubbing, rash, tr edema cool Neuro: alert & orientedx3, cranial nerves grossly intact. moves all 4 extremities w/o difficulty. Affect pleasant  He is extremely tenuous with low  cardiac output and MSOF despite DBA support. Also now in AF with RVR  Volume overloaded and much more SOB this am.   Had long talk with family about options for continues aggressive care versus transition to comfort care  If decide on aggressive care suspect protracted course with need for eventual SNF if he can get to that point. (Wife says he would never want SNF).   Await results of co-ox and results of family discussion for next steps.   Given severity of HF and multi-system organ failure would recommend transition to comfort care  CRITICAL CARE Performed by: Arvilla Meres  Total critical care time: 55 minutes  Critical care time was exclusive of separately billable procedures and treating other patients.  Critical care was necessary to treat or prevent imminent or life-threatening deterioration.  Critical care was time spent personally by me (independent of midlevel providers or residents) on the following activities: development of treatment plan with patient and/or surrogate as well as nursing, discussions with consultants, evaluation of patient's response to treatment, examination of  patient, obtaining history from patient or surrogate, ordering and performing treatments and interventions, ordering and review of laboratory studies, ordering and review of radiographic studies, pulse oximetry and re-evaluation of patient's condition.  Arvilla Meres, MD  10:18 AM

## 2023-09-20 NOTE — Progress Notes (Signed)
Dr. Gala Romney, pronounced death at 60, family at bedside. Bedside RN and charge RN aware. Claudie Rathbone, Randall An RN

## 2023-09-20 NOTE — Progress Notes (Signed)
[facility] NEPHROLOGY PROGRESS NOTE  Assessment/ Plan: Pt is a 83 y.o. yo male with medical history significant for CAD, systolic CHF status post biventricular ICD, HLD, HTN, moderate MR admitted with shortness of breath and CHF exacerbation.  # AKI on CKD 3b - b/l creatinine 1.8- 2.2.  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 CT showing normal kidneys w/o obstruction. UA negative. CXR clear.   Doing well dobutamine  No urgent need for dialysis however, I'm worried that he may not tolerate dialysis long-term, in that case I will recommend palliative hospice care.  We have discussed that with the patient's son and daughter-in-law on 1/15 and they were all in agreement.  Palliative care team is following.  Given the patient's creatinine is fairly stable we will sign off at this time.  As he approaches discharge please let us know and we can arrange outpatient follow-up.  Continue goals of care conversations and diuretics managed by heart failure  # Hyperkalemia : Improved with medical management.  Follow lab.    # Transaminitis likely due to shock liver.  Enzymes are trending down.  Seen by GI.  #HFrEF - last echo dec 2024 w/ EF of 20-25%.  Currently on inotropes, not a candidate for advanced therapies for heart failure team.  Continue diuretics per primary team #H/o severe pHTN #Trop - started on IV heparin, Cards following  Subjective: Continues to have good urine output and creatinine essentially stable today.  This may represent his new baseline with current medications.  He denies any complaints for me today.  Objective Vital signs in last 24 hours: Vitals:    0345  0625  0757  0829  BP: 101/60  111/68 102/68  Pulse: (!) 101  (!) 106 (!) 108  Resp: 20  18 18   Temp: 98.3 F (36.8 C)  97.8 F (36.6 C)   TempSrc: Oral  Oral   SpO2: 91%  94%   Weight:  77.2 kg    Height:        Weight change: -2.9 kg  Intake/Output Summary (Last 24 hours) at  1013 Last data filed at  0349 Gross per 24 hour  Intake 204.01 ml  Output 2650 ml  Net -2445.99 ml       Labs: RENAL PANEL Recent Labs  Lab 09/01/23 0250 09/01/23 0939 09/02/23 0519 09/03/23 0443 09/04/23 0358  0419  0424  NA 133*  --  135 138 134*  --  133*  K 4.3  --  3.3* 3.6 3.6  --  3.9  CL 88*  --  91* 95* 93*  --  90*  CO2 18*  --  25 25 30   --  28  GLUCOSE 96  --  121* 138* 134*  --  121*  BUN 106*  --  109* 96* 83*  --  76*  CREATININE 4.73*  --  4.16* 3.48* 2.93*  --  3.02*  CALCIUM 7.2*  --  7.5* 8.1* 8.5*  --  8.7*  MG  --   --   --  2.4  --  2.2  --   ALBUMIN  --  3.2* 3.0* 2.9* 2.9*  --  3.0*    Liver Function Tests: Recent Labs  Lab 09/03/23 0443 09/04/23 0358  0424  AST 712* 286* 134*  ALT 2,109* 1,664* 1,141*  ALKPHOS 157* 166* 153*  BILITOT 5.2* 5.9* 6.2*  PROT 4.9* 5.1* 5.5*  ALBUMIN 2.9* 2.9* 3.0*   Recent Labs  Lab 08/30/23 1144  LIPASE 53*   Recent Labs  Lab 08/30/23 1918  AMMONIA 29   CBC: Recent Labs    08/31/23 0522 09/01/23 0250 09/02/23 0519 09/03/23 0443 09/04/23 0358  0424  HGB 10.8* 10.8* 10.3* 10.2* 10.5* 10.4*  MCV 90.7 88.3 86.6 87.7 86.8 85.3  FERRITIN 119  --   --   --   --   --   TIBC 448  --   --   --   --   --   IRON 56  --   --   --   --   --     Cardiac Enzymes: Recent Labs  Lab 08/31/23 0522  CKTOTAL 506*   CBG: No results for input(s): "GLUCAP" in the last 168 hours.  Iron Studies:  No results for input(s): "IRON", "TIBC", "TRANSFERRIN", "FERRITIN" in the last 72 hours.  Studies/Results: No results found.   Medications: Infusions:  amiodarone     Followed by   amiodarone     DOBUTamine 2.5 mcg/kg/min (09/04/23 1600)   furosemide     heparin      Scheduled Medications:  amiodarone  150 mg Intravenous Once   aspirin EC  81 mg Oral Daily   Chlorhexidine  Gluconate Cloth  6 each Topical Daily   feeding supplement (NEPRO CARB STEADY)  237 mL Oral BID BM   Gerhardt's butt cream   Topical BID   metolazone  5 mg Oral Once   pantoprazole  40 mg Oral Daily   rosuvastatin  5 mg Oral Daily   sodium chloride flush  10-40 mL Intracatheter Q12H   tamsulosin  0.4 mg Oral Daily    have reviewed scheduled and prn medications.  Physical Exam: General:NAD, able to lie comfortable. Heart:normal rate, no rub Lungs: Clear bilateral, no increased work of breathing. Abdomen:soft, Non-tender, non-distended Extremities:No edema Neurology: Alert awake and following commands  Tommy Ward ,10:13 AM  LOS: 6 days

## 2023-09-20 NOTE — Progress Notes (Signed)
  Placed on comfort gtts with support of Palliative Care team.   Patient initially with significant Cheyne-Stokes breathing, air hunger and gurgling with dilaudid gtt.   Dilaudid increased. Robinol drops and versed added with improvement.   I deactivated ICD personally.   Patient passed at 1:00 PM with family, myself and Palliative Care NP at bedside.   Additional 35 mins CCT.   Arvilla Meres, MD  4:01 PM

## 2023-09-20 NOTE — Discharge Summary (Signed)
Death Summary  Tommy Ward WUJ:[id number] DOB: Aug 10, 1941 DOA: 09-10-23  PCP: Buckner Malta, MD   Admit date: 09/10/2023 Date of Death: 2023/09/16  Final Diagnoses:  Principal Problem:   Acute on chronic systolic heart failure (HCC) Cardiogenic shock Acute kidney injury   Chronic kidney disease (CKD), stage IV (severe) (HCC)   NSTEMI (non-ST elevated myocardial infarction) (HCC)   Shock liver   Essential hypertension   GERD (gastroesophageal reflux disease)   Malignant neoplasm of prostate (HCC)   History of present illness:  82/M with history of chronic systolic CHF, prostate cancer, CKD 4, presented to the ED with dyspnea on exertion, BNP 3686, troponin 3487, creatinine 4.1, AST 3591, ALT 2873, chest x-ray with cardiomegaly, abdominal CT with mild ascites, no hydronephrosis, admitted started on IV Lasix and heparin gtt. -1/13 echo noted EF<20% with signs of hypoperfusion, transferred to ICU, started on dobutamine, IV Lasix -Poor prognosis, CODE STATUS changed to DNR -1/14, palliative care consulted -Overall improving with inotropes, diuresis, AKI improving    Hospital Course:   Acute on chronic systolic heart failure Cardiogenic shock -Known ischemic cardiomyopathy -Echo this admission with a EF<20%, moderate RV dysfunction, moderate MVR -Admitted with AKI and cardiogenic shock -Heart failure team following, diuresed on IV Lasix, dobutamine, Co. ox down to 38, mental status worsening, creatinine worsening despite inotropic support -Prognosis is very poor, palliative care following, DNR, after discussion with heart failure team, palliative care he was transitioned to comfort care and subsequently expired on 09-16-2023   CAD -Not a candidate for cath   NSTEMI versus demand ischemia -Troponin peaked at 3847   AKI, CKD 3b Baseline creatinine around 2, creatinine peaked at 4.7,   Shock liver -LFTs improving, ultrasound without cirrhosis   Acute  thrombocytopenia -Likely reactive in the setting of shock   Malignant neoplasm of prostate [facility])   Code Status: DNR   Time:  Signed:  Zannie Cove  [facility] 09/17/2023, 2:00 PM

## 2023-09-20 NOTE — Progress Notes (Addendum)
Pt dilaudid drip stopped, remainder of drip wasted with Dereck Ligas RN in steri cycle. Tommy Ward, Randall An RN

## 2023-09-20 NOTE — Progress Notes (Signed)
Daily Progress Note   Patient Name: Tommy Ward NFAO       Date:  DOB: 11/28/1940  Age: 83 y.o. MRN#: [id number] Attending Physician: Zannie Cove, MD Primary Care Physician: Buckner Malta, MD Admit Date: 08/30/2023 Length of Stay: 6 days  Reason for Consultation/Follow-up: Establishing goals of care  HPI/Patient Profile:   83 y.o. 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 In ED, pt was found to have BNP of 3686 with trop of 3487. Cr was elevated at 4.10 with K of 5.3. AST was also elevated at 3591 with ALT 2873 with TB of 5.8 and alk phos of 147.  He was admitted on 08/30/2023 with acute on chronic systolic heart failure, AKI on CKD stage IV, NSTEMI, shock liver, and others.    Palliative medicine was consulted for GOC conversations.  Subjective:   Subjective: Chart Reviewed. Updates received. Patient Assessed. Created space and opportunity for patient  and family to explore thoughts and feelings regarding current medical situation.  Today's Discussion: Prior to seeing the patient I received a phone call from Dr. Gala Romney stating that the patient had clinically decompensated rapidly, Choloxin only 38%, belly breathing.  He spoke with the family and a degree to shift to comfort.  Dr. Gala Romney was planning on starting a pain drip until I could get there in approximately 20 minutes.  We discussed options and recommended Dilaudid given the patient's elevated creatinine.  When I came to the room the patient was unresponsive, on a Dilaudid drip, surrounded by his family.  Family was understandably tearful.  I offered my support and condolences at the pending loss of their father.  Dr. Gala Romney and nursing was also present.  We assisted in managing the patient's symptoms over approximately 30 minutes including hydrating the  pain drip, attempting suctioning, and providing emotional support.  The patient passed at 1:00 PM, pronounced by Dr. Gala Romney.  I offered condolences again to the patient's family, offered support, and encouraged him to stay to visit with each other and the patient as long as needed.  I provided contact card for patient placement that they can notify of who they would like to handle services.  I provided emotional and general support through therapeutic listening, empathy, sharing of stories, and other techniques. I answered all questions and addressed all concerns to the best of my ability.  Review of Systems  Unable to perform ROS   Objective:   Vital Signs:  BP (!) 87/63 (BP Location: Left Arm)   Pulse (!) 110   Temp 98.4 F (36.9 C) (Oral)   Resp 20   Ht 5\' 9"  (1.753 m)   Wt 77.2 kg   SpO2 94%   BMI 25.13 kg/m   Physical Exam Vitals and nursing note reviewed.  Constitutional:      General: He is sleeping. He is not in acute distress.    Appearance: He is ill-appearing.  HENT:     Head: Normocephalic and atraumatic.  Cardiovascular:     Rate and Rhythm: Normal rate.  Pulmonary:     Effort: Respiratory distress (Initially, improved with medication management) present.  Skin:    General: Skin is warm and dry.  Neurological:     Mental Status: He is easily aroused. He is unresponsive.     Palliative Assessment/Data: 10%    Existing  Vynca/ACP Documentation: None  Assessment & Plan:   Impression: Present on Admission:  Chronic kidney disease (CKD), stage IV (severe) (HCC)  Acute on chronic systolic heart failure (HCC)  Essential hypertension  Malignant neoplasm of prostate (HCC)  GERD (gastroesophageal reflux disease)  83 year old male with acute presentation of chronic comorbidities as described above.  He was in a very tricky clinical situation with acute on chronic heart failure resulting in AKI on CKD and shock liver.  It appeared that he has functional  liver decline as well.  Family understood that he is very sick, possibility he may not survive this in the long-term.  Due to significant clinical decline over the past 24 hours they elected comfort care.  The patient was supported while actively dying and passed away at 1:00.  Significant emotional support offered to the patient's family  SUMMARY OF RECOMMENDATIONS   Emotional support to family as patient has passed  Symptom Management:  Per primary team PMT is available to assist as needed  Code Status: DNR-limited  Prognosis: Hours - Days  Discharge Planning: Anticipated Hospital Death  Discussed with: Patient, family, medical team, nursing team  Thank you for allowing [place] to participate in the care of Johanan Saucer PMT will continue to support holistically.  Time Total: 60 min  Detailed review of medical records (labs, imaging, vital signs), medically appropriate exam, discussed with treatment team, counseling and education to patient, family, & staff, documenting clinical information, medication management, coordination of care  Wynne Dust, NP Palliative Medicine Team  Team Phone # [phone] (Nights/Weekends)  04/17/2021, 8:17 AM

## 2023-09-20 NOTE — Progress Notes (Addendum)
PROGRESS NOTE    Tommy Ward  YQM:[id number] DOB: July 27, 1941 DOA: 08/30/2023 PCP: Buckner Malta, MD  82/M with history of chronic systolic CHF, prostate cancer, CKD 4, presented to the ED with dyspnea on exertion, BNP 3686, troponin 3487, creatinine 4.1, AST 3591, ALT 2873, chest x-ray with cardiomegaly, abdominal CT with mild ascites, no hydronephrosis, admitted started on IV Lasix and heparin gtt. -1/13 echo noted EF<20% with signs of hypoperfusion, transferred to ICU, started on dobutamine, IV Lasix -Poor prognosis, CODE STATUS changed to DNR -1/14, palliative care consulted -Overall improving with inotropes, diuresis, AKI improving  Subjective: -Feels better overall, breathing is improving  Assessment and Plan:  Acute on chronic systolic heart failure Cardiogenic shock -Known ischemic cardiomyopathy -Echo this admission with a EF<20%, moderate RV dysfunction, moderate MVR -Admitted with AKI and cardiogenic shock -Heart failure team following, diuresed on IV Lasix, dobutamine, Co. ox down to 38 today, creatinine worsening -Prognosis is very poor, palliative care following, DNR, most appropriate for comfort care and hospice, will reach out to palliative care  CAD -Not a candidate for cath with significant AKI regardless -Continue aspirin, cards restarted statins yesterday   NSTEMI versus demand ischemia -Troponin peaked at 3847 -No chest pain, suspected to be demand related from cardiogenic shock -Cards following, no plan for ischemic eval in the setting of AKI  AKI, CKD 3b Baseline creatinine around 2, creatinine peaked at 4.7, now improving on dobutamine -Cardiorenal, creatinine started to trend up some today  Shock liver -LFTs improving, ultrasound without cirrhosis  Acute thrombocytopenia -Likely reactive in the setting of shock, also ordered HIT antibody panel for completeness -Was on bivalirudin for few days, switched back to heparin 1/14 -Now  improving  Malignant neoplasm of prostate (HCC) Follow up as outpatient.   DVT prophylaxis: Heparin subcutaneous Code Status: DNR Family Communication: No family at bedside Disposition Plan: TBD  Consultants:    Procedures:   Antimicrobials:    Objective: Vitals:    0625  0757  0829  1140  BP:  111/68 102/68 (!) 87/63  Pulse:  (!) 106 (!) 108 (!) 110  Resp:  18 18 20   Temp:  97.8 F (36.6 C)  98.4 F (36.9 C)  TempSrc:  Oral  Oral  SpO2:  94%  94%  Weight: 77.2 kg     Height:        Intake/Output Summary (Last 24 hours) at  1152 Last data filed at  0349 Gross per 24 hour  Intake 26.6 ml  Output 2650 ml  Net -2623.4 ml   Filed Weights   09/03/23 0500 09/04/23 0500  0625  Weight: 80.6 kg 80.1 kg 77.2 kg    Examination:  General exam: Elderly male laying in bed, AAO x 2, some cognitive deficits HEENT: Positive JVD, icterus CVS: S1-S2, regular rhythm Lungs: Decreased breath sounds at the bases Abdomen: Soft, nontender, bowel sounds present Extremities: Trace edema, right arm PICC line Psych: Flat affect   Data Reviewed:   CBC: Recent Labs  Lab 08/30/23 1144 08/31/23 0222 09/01/23 0250 09/02/23 0519 09/03/23 0443 09/04/23 0358  0424  WBC 11.5*   < > 10.0 7.0 6.0 8.9 7.9  NEUTROABS 9.1*  --   --   --   --   --   --   HGB 11.5*   < > 10.8* 10.3* 10.2* 10.5* 10.4*  HCT 37.7*   < > 34.0* 31.6* 32.7* 32.9* 32.4*  MCV 92.6   < > 88.3 86.6 87.7 86.8 85.3  PLT 166   < > 65* 76* 76* 88* 104*   < > = values in this interval not displayed.   Basic Metabolic Panel: Recent Labs  Lab 09/01/23 0250 09/02/23 0519 09/03/23 0443 09/04/23 0358  0419  0424  NA 133* 135 138 134*  --  133*  K 4.3 3.3* 3.6 3.6  --  3.9  CL 88* 91* 95* 93*  --  90*  CO2 18* 25 25 30   --  28  GLUCOSE 96 121* 138* 134*  --  121*  BUN 106* 109* 96* 83*  --  76*  CREATININE 4.73* 4.16* 3.48*  2.93*  --  3.02*  CALCIUM 7.2* 7.5* 8.1* 8.5*  --  8.7*  MG  --   --  2.4  --  2.2  --    GFR: Estimated Creatinine Clearance: 19.2 mL/min (A) (by C-G formula based on SCr of 3.02 mg/dL (H)). Liver Function Tests: Recent Labs  Lab 09/01/23 0939 09/02/23 0519 09/03/23 0443 09/04/23 0358  0424  AST 3,190* 1,868* 712* 286* 134*  ALT 3,386* 2,815* 2,109* 1,664* 1,141*  ALKPHOS 183* 183* 157* 166* 153*  BILITOT 7.2* 6.0* 5.2* 5.9* 6.2*  PROT 5.4* 5.0* 4.9* 5.1* 5.5*  ALBUMIN 3.2* 3.0* 2.9* 2.9* 3.0*   Recent Labs  Lab 08/30/23 1144  LIPASE 53*   Recent Labs  Lab 08/30/23 1918  AMMONIA 29   Coagulation Profile: Recent Labs  Lab 08/31/23 1029 09/02/23 0519 09/03/23 0443 09/04/23 0358  0424  INR 3.4* 3.8* 1.7* 1.4* 1.2   Cardiac Enzymes: Recent Labs  Lab 08/31/23 0522  CKTOTAL 506*   BNP (last 3 results) No results for input(s): "PROBNP" in the last 8760 hours. HbA1C: No results for input(s): "HGBA1C" in the last 72 hours. CBG: No results for input(s): "GLUCAP" in the last 168 hours. Lipid Profile: No results for input(s): "CHOL", "HDL", "LDLCALC", "TRIG", "CHOLHDL", "LDLDIRECT" in the last 72 hours. Thyroid Function Tests: No results for input(s): "TSH", "T4TOTAL", "FREET4", "T3FREE", "THYROIDAB" in the last 72 hours. Anemia Panel: No results for input(s): "VITAMINB12", "FOLATE", "FERRITIN", "TIBC", "IRON", "RETICCTPCT" in the last 72 hours. Urine analysis:    Component Value Date/Time   COLORURINE AMBER (A) 08/31/2023 0345   APPEARANCEUR HAZY (A) 08/31/2023 0345   LABSPEC 1.012 08/31/2023 0345   PHURINE 5.0 08/31/2023 0345   GLUCOSEU NEGATIVE 08/31/2023 0345   HGBUR NEGATIVE 08/31/2023 0345   BILIRUBINUR NEGATIVE 08/31/2023 0345   KETONESUR NEGATIVE 08/31/2023 0345   PROTEINUR 30 (A) 08/31/2023 0345   NITRITE NEGATIVE 08/31/2023 0345   LEUKOCYTESUR NEGATIVE 08/31/2023 0345   Sepsis  Labs: @LABRCNTIP (procalcitonin:4,lacticidven:4)  ) Recent Results (from the past 240 hours)  Resp panel by RT-PCR (RSV, Flu A&B, Covid) Anterior Nasal Swab     Status: None   Collection Time: 08/30/23 11:44 AM   Specimen: Anterior Nasal Swab  Result Value Ref Range Status   SARS Coronavirus 2 by RT PCR NEGATIVE NEGATIVE Final   Influenza A by PCR NEGATIVE NEGATIVE Final   Influenza B by PCR NEGATIVE NEGATIVE Final    Comment: (NOTE) The Xpert Xpress SARS-CoV-2/FLU/RSV plus assay is intended as an aid in the diagnosis of influenza from Nasopharyngeal swab specimens and should not be used as a sole basis for treatment. Nasal washings and aspirates are unacceptable for Xpert Xpress SARS-CoV-2/FLU/RSV testing.  Fact Sheet for Patients: BloggerCourse.com  Fact Sheet for Healthcare Providers: SeriousBroker.it  This test is not yet approved or cleared by the [place] and  has been authorized for detection and/or diagnosis of SARS-CoV-2 by FDA under an Emergency Use Authorization (EUA). This EUA will remain in effect (meaning this test can be used) for the duration of the COVID-19 declaration under Section 564(b)(1) of the Act, 21 U.S.C. section 360bbb-3(b)(1), unless the authorization is terminated or revoked.     Resp Syncytial Virus by PCR NEGATIVE NEGATIVE Final    Comment: (NOTE) Fact Sheet for Patients: BloggerCourse.com  Fact Sheet for Healthcare Providers: SeriousBroker.it  This test is not yet approved or cleared by the [place] FDA and has been authorized for detection and/or diagnosis of SARS-CoV-2 by FDA under an Emergency Use Authorization (EUA). This EUA will remain in effect (meaning this test can be used) for the duration of the COVID-19 declaration under Section 564(b)(1) of the Act, 21 U.S.C. section 360bbb-3(b)(1), unless the authorization is  terminated or revoked.  Performed at [facility], [address], [place], [place] [address]   MRSA Next Gen by PCR, Nasal     Status: None   Collection Time: 09/01/23  6:34 PM   Specimen: Nasal Mucosa; Nasal Swab  Result Value Ref Range Status   MRSA by PCR Next Gen NOT DETECTED NOT DETECTED Final    Comment: (NOTE) The GeneXpert MRSA Assay (FDA approved for NASAL specimens only), is one component of a comprehensive MRSA colonization surveillance program. It is not intended to diagnose MRSA infection nor to guide or monitor treatment for MRSA infections. Test performance is not FDA approved in patients less than 13 years old. Performed at [facility], [address] [place], [place] [address]      Radiology Studies: No results found.   Scheduled Meds:  aspirin EC  81 mg Oral Daily   Chlorhexidine Gluconate Cloth  6 each Topical Daily   feeding supplement (NEPRO CARB STEADY)  237 mL Oral BID BM   Gerhardt's butt cream   Topical BID   pantoprazole  40 mg Oral Daily   rosuvastatin  5 mg Oral Daily   sodium chloride flush  10-40 mL Intracatheter Q12H   tamsulosin  0.4 mg Oral Daily   Continuous Infusions:  amiodarone 59.94 mg/hr ( 1057)   Followed by   amiodarone     DOBUTamine 2.5 mcg/kg/min (09/04/23 1600)   furosemide 160 mg ( 1049)   heparin 1,000 Units/hr ( 1110)     LOS: 6 days    Time spent:    Zannie Cove, MD [facility]   , 11:52 AM

## 2023-09-20 NOTE — Progress Notes (Signed)
  Co-ox 38% despite DBA support.   Patient's breathing more labored. Uncomfortable.   Patient and family have chosen to transition to comfort care.   I spoke with Wynne Dust, NP in [facility] who will f/u soon  Will start dilaudid for comfort. D/c all other IV therapies.   Additional CCT 40 mins.   Arvilla Meres, MD  12:14 PM

## 2023-09-20 NOTE — Plan of Care (Signed)
  Problem: Clinical Measurements: Goal: Ability to maintain clinical measurements within normal limits will improve Outcome: Progressing Goal: Respiratory complications will improve Outcome: Progressing Goal: Cardiovascular complication will be avoided Outcome: Progressing   Problem: Coping: Goal: Level of anxiety will decrease Outcome: Progressing   Problem: Elimination: Goal: Will not experience complications related to urinary retention Outcome: Progressing   Problem: Pain Management: Goal: General experience of comfort will improve Outcome: Progressing

## 2023-09-20 DEATH — deceased

## 2023-09-22 ENCOUNTER — Ambulatory Visit: Payer: Medicare Other

## 2023-10-07 ENCOUNTER — Ambulatory Visit: Payer: Medicare Other | Admitting: Podiatry

## 2023-10-08 ENCOUNTER — Ambulatory Visit (HOSPITAL_BASED_OUTPATIENT_CLINIC_OR_DEPARTMENT_OTHER): Payer: Medicare Other | Admitting: Nurse Practitioner

## 2023-10-10 NOTE — Telephone Encounter (Signed)
 Marland Kitchen

## 2023-10-13 ENCOUNTER — Other Ambulatory Visit: Payer: Medicare Other

## 2023-10-14 ENCOUNTER — Ambulatory Visit: Payer: Medicare Other | Admitting: Oncology

## 2023-11-03 ENCOUNTER — Encounter (HOSPITAL_COMMUNITY): Payer: Medicare Other

## 2023-12-22 ENCOUNTER — Ambulatory Visit: Payer: Medicare Other

## 2024-03-22 ENCOUNTER — Ambulatory Visit: Payer: Medicare Other

## 2024-06-21 ENCOUNTER — Ambulatory Visit: Payer: Medicare Other

## 2024-09-20 ENCOUNTER — Ambulatory Visit: Payer: Medicare Other

## 2024-12-20 ENCOUNTER — Ambulatory Visit: Payer: Medicare Other

## 2025-03-21 ENCOUNTER — Ambulatory Visit: Payer: Medicare Other

## 2025-06-20 ENCOUNTER — Ambulatory Visit: Payer: Medicare Other

## 2025-09-19 ENCOUNTER — Ambulatory Visit: Payer: Medicare Other
